# Patient Record
Sex: Female | Born: 1955 | Race: White | Hispanic: No | Smoking: Never smoker
Health system: Southern US, Community
[De-identification: ages and names within clinical notes are randomized; demographics above are authoritative.]

## PROBLEM LIST (undated history)

## (undated) DIAGNOSIS — I471 Supraventricular tachycardia, unspecified: Secondary | ICD-10-CM

## (undated) DIAGNOSIS — K219 Gastro-esophageal reflux disease without esophagitis: Secondary | ICD-10-CM

## (undated) DIAGNOSIS — R519 Headache, unspecified: Secondary | ICD-10-CM

## (undated) DIAGNOSIS — G8929 Other chronic pain: Secondary | ICD-10-CM

## (undated) DIAGNOSIS — M1990 Unspecified osteoarthritis, unspecified site: Secondary | ICD-10-CM

## (undated) DIAGNOSIS — T7840XA Allergy, unspecified, initial encounter: Secondary | ICD-10-CM

## (undated) DIAGNOSIS — J342 Deviated nasal septum: Secondary | ICD-10-CM

## (undated) DIAGNOSIS — Z8709 Personal history of other diseases of the respiratory system: Secondary | ICD-10-CM

## (undated) DIAGNOSIS — F419 Anxiety disorder, unspecified: Secondary | ICD-10-CM

## (undated) DIAGNOSIS — Z8739 Personal history of other diseases of the musculoskeletal system and connective tissue: Secondary | ICD-10-CM

## (undated) DIAGNOSIS — M419 Scoliosis, unspecified: Secondary | ICD-10-CM

## (undated) DIAGNOSIS — R51 Headache: Secondary | ICD-10-CM

## (undated) HISTORY — DX: Allergy, unspecified, initial encounter: T78.40XA

## (undated) HISTORY — DX: Personal history of other diseases of the musculoskeletal system and connective tissue: Z87.39

## (undated) HISTORY — DX: Deviated nasal septum: J34.2

## (undated) HISTORY — DX: Personal history of other diseases of the respiratory system: Z87.09

---

## 2005-02-02 ENCOUNTER — Ambulatory Visit: Payer: Self-pay | Admitting: Internal Medicine

## 2005-02-06 ENCOUNTER — Ambulatory Visit: Payer: Self-pay | Admitting: Internal Medicine

## 2006-02-22 ENCOUNTER — Ambulatory Visit: Payer: Self-pay | Admitting: Internal Medicine

## 2007-03-13 ENCOUNTER — Ambulatory Visit: Payer: Self-pay | Admitting: Internal Medicine

## 2007-05-20 ENCOUNTER — Ambulatory Visit: Payer: Self-pay | Admitting: Unknown Physician Specialty

## 2008-03-16 ENCOUNTER — Ambulatory Visit: Payer: Self-pay | Admitting: Internal Medicine

## 2009-03-24 ENCOUNTER — Ambulatory Visit: Payer: Self-pay | Admitting: Internal Medicine

## 2010-04-19 ENCOUNTER — Ambulatory Visit: Payer: Self-pay | Admitting: Internal Medicine

## 2010-07-25 ENCOUNTER — Emergency Department: Payer: Self-pay | Admitting: Internal Medicine

## 2011-04-06 LAB — HM PAP SMEAR

## 2011-06-06 ENCOUNTER — Ambulatory Visit: Payer: Self-pay | Admitting: Internal Medicine

## 2011-06-06 LAB — HM MAMMOGRAPHY

## 2012-03-01 ENCOUNTER — Telehealth: Payer: Self-pay | Admitting: Internal Medicine

## 2012-03-01 MED ORDER — HYDROCORTISONE ACETATE 25 MG RE SUPP: 25.0000 mg | Freq: Two times a day (BID) | RECTAL | Status: DC

## 2012-03-01 NOTE — Telephone Encounter (Signed)
Refill Hydrocortisone HC 25 mg suppository. [facility] on [address].

## 2012-03-01 NOTE — Telephone Encounter (Signed)
Per Dr. Lorin Picket Refill with quant of 12 and 1 refill. Called patient and she is aware.

## 2012-03-22 ENCOUNTER — Encounter: Payer: Self-pay | Admitting: *Deleted

## 2012-04-05 ENCOUNTER — Ambulatory Visit (INDEPENDENT_AMBULATORY_CARE_PROVIDER_SITE_OTHER): Payer: BC Managed Care – PPO | Admitting: Internal Medicine

## 2012-04-05 ENCOUNTER — Encounter: Payer: Self-pay | Admitting: Internal Medicine

## 2012-04-05 VITALS — BP 120/82 | HR 77 | Temp 98.0°F | Ht 66.0 in | Wt 148.2 lb

## 2012-04-05 DIAGNOSIS — Z139 Encounter for screening, unspecified: Secondary | ICD-10-CM

## 2012-04-05 DIAGNOSIS — R5383 Other fatigue: Secondary | ICD-10-CM

## 2012-04-05 DIAGNOSIS — Z9109 Other allergy status, other than to drugs and biological substances: Secondary | ICD-10-CM

## 2012-04-05 DIAGNOSIS — R5381 Other malaise: Secondary | ICD-10-CM

## 2012-04-05 DIAGNOSIS — K219 Gastro-esophageal reflux disease without esophagitis: Secondary | ICD-10-CM

## 2012-04-05 MED ORDER — AZELASTINE HCL 0.1 % NA SOLN: 1.0000 | Freq: Two times a day (BID) | NASAL | Status: DC

## 2012-04-07 ENCOUNTER — Encounter: Payer: Self-pay | Admitting: Internal Medicine

## 2012-04-07 DIAGNOSIS — K219 Gastro-esophageal reflux disease without esophagitis: Secondary | ICD-10-CM | POA: Insufficient documentation

## 2012-04-07 DIAGNOSIS — Z91048 Other nonmedicinal substance allergy status: Secondary | ICD-10-CM | POA: Insufficient documentation

## 2012-04-07 NOTE — Progress Notes (Addendum)
  Subjective:    Patient ID: Theresa Buck, female    DOB: Oct 17, 1955, 56 y.o.   MRN: [id number]  HPI 56 year old female with past history of recurrent allergy and sinus problems who comes in today for a scheduled follow up.  She states she has been under increased stress recently.  Her mother passed away recently.  Was diagnosed with a brain tumor and passed away quickly.  Her son (32) also recenlty had a myocardial infarction and had to have bypass surgery.  Feels she is handling things relatively well.  Does not feel she needs any further intervention at this time.  Eating and drinking well.  No nausea or vomiting.  No bowel change.  Hair is thinning.    Past Medical History  Diagnosis Date  . H/O sinusitis   . Deviated septum   . Hx of degenerative disc disease     Review of Systems Patient denies any headache, lightheadedness or dizziness.  No significant sinu or allergy symptoms.  No chest pain, tightness or palpitations.  No increased shortness of breath, cough or congestion.  No nausea or vomiting.  No abdominal pain or cramping.  No bowel change, such as diarrhea, constipation, BRBPR or melana.  No urine change.  Does report some fatigue.       Objective:   Physical Exam Filed Vitals:   04/05/12 1557  BP: 120/82  Pulse: 77  Temp: 98 F (70.78 C)   56 year old female in no acute distress.   HEENT:  Nares - clear.  OP- without lesions or erythema.  NECK:  Supple, nontender.  No audible bruit.   HEART:  Appears to be regular. LUNGS:  Without crackles or wheezing audible.  Respirations even and unlabored.   RADIAL PULSE:  Equal bilaterally.  ABDOMEN:  Soft, nontender.  No audible abdominal bruit.   EXTREMITIES:  No increased edema to be present.                  Assessment & Plan:  INCREASED PSYCHOSOCIAL STRESSORS.  Feels she is handling things relatively well.  Does not need any further intervention at this point.  Follow.    CARDIOVASCULAR.  Had stress test 10/29/09 that was  negative for ischemia.  Currently asymptomatic.   GI.  Colonoscopy 05/20/07 revealed a 5mm polyp in the transverse colon and internal hemorrhoids.  Recommended follow up colonoscopy in five years.  Hemoccult cards negative 06/05/11.    FATIGUE.  Probably related to above.  Follow.  Check cbc,metc and tsh.   HEALTH MAINTENANCE. Physical 05/03/11.  Pelvic and pap 06/06/11 negative with negative HPV.  Mammogram 06/06/11 - BiRADS II.

## 2012-04-07 NOTE — Assessment & Plan Note (Signed)
EGD 02/03/00 revealed reflux esophagitis.  On protonix and doing well.  Follow.  

## 2012-04-07 NOTE — Assessment & Plan Note (Signed)
Having increased drainage and "dripping".  Is on Flonase and an antihistamine.  Will try Astelin nasal spray.  Notify me if persistent problems.

## 2012-04-08 ENCOUNTER — Encounter: Payer: Self-pay | Admitting: *Deleted

## 2012-04-30 ENCOUNTER — Telehealth: Payer: Self-pay | Admitting: Internal Medicine

## 2012-04-30 NOTE — Telephone Encounter (Signed)
Patient wants to know when she should have her blood work done.

## 2012-04-30 NOTE — Telephone Encounter (Signed)
Labs scheduled for 05-20-12

## 2012-05-22 ENCOUNTER — Other Ambulatory Visit: Payer: BC Managed Care – PPO

## 2012-05-27 ENCOUNTER — Other Ambulatory Visit (INDEPENDENT_AMBULATORY_CARE_PROVIDER_SITE_OTHER): Payer: BC Managed Care – PPO

## 2012-05-27 DIAGNOSIS — R5381 Other malaise: Secondary | ICD-10-CM

## 2012-05-27 DIAGNOSIS — Z139 Encounter for screening, unspecified: Secondary | ICD-10-CM

## 2012-05-27 DIAGNOSIS — R5383 Other fatigue: Secondary | ICD-10-CM

## 2012-05-27 LAB — CBC WITH DIFFERENTIAL/PLATELET
Basophils Absolute: 0 10*3/uL (ref 0.0–0.1)
Basophils Relative: 0.4 % (ref 0.0–3.0)
Eosinophils Absolute: 0.3 10*3/uL (ref 0.0–0.7)
Eosinophils Relative: 4.4 % (ref 0.0–5.0)
HCT: 39.6 % (ref 36.0–46.0)
Hemoglobin: 13.4 g/dL (ref 12.0–15.0)
Lymphocytes Relative: 24.9 % (ref 12.0–46.0)
Lymphs Abs: 1.6 10*3/uL (ref 0.7–4.0)
MCHC: 33.8 g/dL (ref 30.0–36.0)
MCV: 93.4 fl (ref 78.0–100.0)
Monocytes Absolute: 0.5 10*3/uL (ref 0.1–1.0)
Monocytes Relative: 7.8 % (ref 3.0–12.0)
Neutro Abs: 4 10*3/uL (ref 1.4–7.7)
Neutrophils Relative %: 62.5 % (ref 43.0–77.0)
Platelets: 214 10*3/uL (ref 150.0–400.0)
RBC: 4.24 Mil/uL (ref 3.87–5.11)
RDW: 12.6 % (ref 11.5–14.6)
WBC: 6.4 10*3/uL (ref 4.5–10.5)

## 2012-05-27 LAB — COMPREHENSIVE METABOLIC PANEL
ALT: 13 U/L (ref 0–35)
AST: 17 U/L (ref 0–37)
Albumin: 3.8 g/dL (ref 3.5–5.2)
Alkaline Phosphatase: 80 U/L (ref 39–117)
BUN: 15 mg/dL (ref 6–23)
CO2: 27 mEq/L (ref 19–32)
Calcium: 9 mg/dL (ref 8.4–10.5)
Chloride: 108 mEq/L (ref 96–112)
Creatinine, Ser: 0.7 mg/dL (ref 0.4–1.2)
GFR: 89.28 mL/min (ref >60.00)
Glucose, Bld: 86 mg/dL (ref 70–99)
Potassium: 4.1 mEq/L (ref 3.5–5.1)
Sodium: 141 mEq/L (ref 135–145)
Total Bilirubin: 0.8 mg/dL (ref 0.3–1.2)
Total Protein: 6.9 g/dL (ref 6.0–8.3)

## 2012-05-27 LAB — TSH: TSH: 1.12 u[IU]/mL (ref 0.35–5.50)

## 2012-05-27 LAB — LIPID PANEL
Cholesterol: 184 mg/dL (ref 0–200)
HDL: 43.7 mg/dL (ref >39.00)
LDL Cholesterol: 119 mg/dL — ABNORMAL HIGH (ref 0–99)
Total CHOL/HDL Ratio: 4
Triglycerides: 106 mg/dL (ref 0.0–149.0)
VLDL: 21.2 mg/dL (ref 0.0–40.0)

## 2012-06-03 ENCOUNTER — Encounter: Payer: Self-pay | Admitting: Internal Medicine

## 2012-06-04 ENCOUNTER — Other Ambulatory Visit: Payer: Self-pay | Admitting: *Deleted

## 2012-06-04 ENCOUNTER — Telehealth: Payer: Self-pay | Admitting: Internal Medicine

## 2012-06-04 DIAGNOSIS — M25579 Pain in unspecified ankle and joints of unspecified foot: Secondary | ICD-10-CM

## 2012-06-04 NOTE — Telephone Encounter (Signed)
Please leave a message with her lab results. On her cell phone #

## 2012-06-04 NOTE — Telephone Encounter (Signed)
Order placed for podiatry referral.   

## 2012-06-05 NOTE — Telephone Encounter (Signed)
Patient notified. Patient would like an early am appointment or a late pm appointment. Thank you.

## 2012-06-06 MED ORDER — FLUTICASONE PROPIONATE 50 MCG/ACT NA SUSP: 2.0000 | Freq: Every day | NASAL | Status: DC

## 2012-06-08 ENCOUNTER — Other Ambulatory Visit: Payer: Self-pay

## 2012-07-01 ENCOUNTER — Other Ambulatory Visit: Payer: Self-pay | Admitting: Internal Medicine

## 2012-07-05 ENCOUNTER — Encounter: Payer: BC Managed Care – PPO | Admitting: Internal Medicine

## 2012-07-19 ENCOUNTER — Ambulatory Visit (INDEPENDENT_AMBULATORY_CARE_PROVIDER_SITE_OTHER): Payer: BC Managed Care – PPO | Admitting: Internal Medicine

## 2012-07-19 ENCOUNTER — Encounter: Payer: Self-pay | Admitting: Internal Medicine

## 2012-07-19 ENCOUNTER — Other Ambulatory Visit (HOSPITAL_COMMUNITY)
Admission: RE | Admit: 2012-07-19 | Discharge: 2012-07-19 | Disposition: A | Discharge status: Home / Self Care | Payer: BC Managed Care – PPO | Source: Ambulatory Visit | Attending: Internal Medicine | Admitting: Internal Medicine

## 2012-07-19 VITALS — BP 108/60 | HR 72 | Temp 97.5°F | Ht 66.0 in | Wt 151.5 lb

## 2012-07-19 DIAGNOSIS — K219 Gastro-esophageal reflux disease without esophagitis: Secondary | ICD-10-CM

## 2012-07-19 DIAGNOSIS — R000 Tachycardia, unspecified: Secondary | ICD-10-CM

## 2012-07-19 DIAGNOSIS — Z139 Encounter for screening, unspecified: Secondary | ICD-10-CM

## 2012-07-19 DIAGNOSIS — Z1239 Encounter for other screening for malignant neoplasm of breast: Secondary | ICD-10-CM

## 2012-07-19 DIAGNOSIS — Z01419 Encounter for gynecological examination (general) (routine) without abnormal findings: Secondary | ICD-10-CM | POA: Insufficient documentation

## 2012-07-19 DIAGNOSIS — Z124 Encounter for screening for malignant neoplasm of cervix: Secondary | ICD-10-CM

## 2012-07-19 DIAGNOSIS — Z1151 Encounter for screening for human papillomavirus (HPV): Secondary | ICD-10-CM | POA: Insufficient documentation

## 2012-07-19 DIAGNOSIS — Z9109 Other allergy status, other than to drugs and biological substances: Secondary | ICD-10-CM

## 2012-07-20 LAB — WET PREP BY MOLECULAR PROBE
Candida species: NEGATIVE
Gardnerella vaginalis: NEGATIVE
Trichomonas vaginosis: NEGATIVE

## 2012-07-21 ENCOUNTER — Encounter: Payer: Self-pay | Admitting: Internal Medicine

## 2012-07-21 NOTE — Progress Notes (Signed)
Subjective:    Patient ID: Theresa Buck, female    DOB: 1956-02-23, 57 y.o.   MRN: [id number]  HPI 57 year old female with past history of recurrent allergy and sinus problems who comes in today to follow up on these issues as well as for her complete physical exam.  Still dealing with increased stress.  Trying to cope with her mother's death.   Her son (2) also recenlty had a myocardial infarction and had to have bypass surgery.  Feels she is handling things relatively well.  Does not feel she needs any further intervention at this time. Some better.  Eating and drinking well.  No nausea or vomiting.  No bowel change.  She has had intermittent episodes of feeling her heart pounding.  The episodes will vary in length.  One recent episode lasted approximately 10 minutes.  Heart pounding.  Felt into her jaw.  Took aspirin.  Resolved.  Was not evaluated.  Tries to stay active.  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  Breathing stable.  No vomiting.  Bowels stable.    Past Medical History  Diagnosis Date  . H/O sinusitis   . Deviated septum   . Hx of degenerative disc disease   . Deviated septum   . Allergy     recurring sinus problems     Current Outpatient Prescriptions on File Prior to Visit  Medication Sig Dispense Refill  . cholecalciferol (VITAMIN D) 400 UNITS TABS Take 400 Units by mouth daily.      . fish oil-omega-3 fatty acids 1000 MG capsule Take 2 g by mouth daily.      . fluticasone (FLONASE) 50 MCG/ACT nasal spray Place 2 sprays into the nose daily.  16 g  3  . mometasone (NASONEX) 50 MCG/ACT nasal spray Place 2 sprays into the nose daily.      Marland Kitchen azelastine (ASTELIN) 137 MCG/SPRAY nasal spray Place 1 spray into the nose 2 (two) times daily. Use in each nostril as directed  30 mL  1  . hydrocortisone (ANUSOL-HC) 25 MG suppository Place 1 suppository (25 mg total) rectally 2 (two) times daily.  12 suppository  1  . pantoprazole (PROTONIX) 40 MG  tablet take 1 tablet by mouth once daily  30 tablet  2   No current facility-administered medications on file prior to visit.    Review of Systems Patient denies any headache, lightheadedness or dizziness.  No significant sinus or allergy symptoms.  She does report the intermittent fluttering, heart racing and heart pounding at times.  see above.  No increased shortness of breath, cough or congestion.  No nausea or vomiting.  Acid reflux controlled.  No abdominal pain or cramping.  No bowel change, such as diarrhea, constipation, BRBPR or melana.  No urine change.         Objective:   Physical Exam  Filed Vitals:   07/19/12 1433  BP: 108/60  Pulse: 72  Temp: 97.5 F (32.54 C)   57 year old female in no acute distress.   HEENT:  Nares- clear.  Oropharynx - without lesions. NECK:  Supple.  Nontender.  No audible bruit.  HEART:  Appears to be regular. LUNGS:  No crackles or wheezing audible.  Respirations even and unlabored.  RADIAL PULSE:  Equal bilaterally.    BREASTS:  No nipple discharge or nipple retraction present.  Could not appreciate any distinct nodules or axillary adenopathy.  ABDOMEN:  Soft, nontender.  Bowel sounds present and normal.  No audible abdominal bruit.  GU:  Normal external genitalia.  Vaginal vault without lesions.  Cervix identified.  Pap performed. Could not appreciate any adnexal masses or tenderness.   RECTAL:  Heme negative.   EXTREMITIES:  No increased edema present.  DP pulses palpable and equal bilaterally.          Assessment & Plan:  INCREASED PSYCHOSOCIAL STRESSORS.  Feels she is handling things relatively well.  Does not need any further intervention at this point.  Follow.  We discussed medication.  She wants to hold off at this time. Follow.   CARDIOVASCULAR.  Had stress test 10/29/09 that was negative for ischemia.  Symptoms as outlined. EKG today revealed SR with no acute ischemic changes.  See if can obtain old EKG for comparison.  Given  persistent intermittent episodes, I do feel she warrants further cardiac evaluation.  Will refer to cardiology for evaluation and further treatment.  Pt comfortable with this plan.    GI.  Colonoscopy 05/20/07 revealed a 5mm polyp in the transverse colon and internal hemorrhoids.  Recommended follow up colonoscopy in five years.  Due.  Will need to get the cardiac issue straightened out first.     HEALTH MAINTENANCE.  Physical today.   Mammogram 06/06/11 - BiRADS II.  Schedule a follow up mammogram.  GI as outlined.

## 2012-07-21 NOTE — Assessment & Plan Note (Signed)
Stable on current regimen.  Follow.   

## 2012-07-21 NOTE — Assessment & Plan Note (Signed)
EGD 02/03/00 revealed reflux esophagitis.  On protonix and doing well.  Follow.  

## 2012-07-22 ENCOUNTER — Encounter: Payer: Self-pay | Admitting: Emergency Medicine

## 2012-07-26 ENCOUNTER — Encounter: Payer: Self-pay | Admitting: Emergency Medicine

## 2012-07-26 ENCOUNTER — Telehealth: Payer: Self-pay | Admitting: Internal Medicine

## 2012-07-26 NOTE — Telephone Encounter (Signed)
Asking for referral to Dr. Mariah Milling - if so needs early a.m. Or late p.m. Appt.  Sched to be on vacation next week starting Wednesday so would have to be the week after at the earliest.   Just saw on mychart that you missed appt that was scheduled on 4/2 - not sure who that appt was with but wants to make sure she's not charged because she was unaware of the appt.

## 2012-07-26 NOTE — Telephone Encounter (Signed)
Noted  

## 2012-07-26 NOTE — Telephone Encounter (Signed)
Dr. Philemon Kingdom office called stating that pt missed an appointment with them on Wednesday. Their office will call later this afternoon to try to reschedule.

## 2012-07-26 NOTE — Telephone Encounter (Signed)
Patient has been rescheduled.

## 2012-07-26 NOTE — Telephone Encounter (Signed)
Spoke to patient about missing her appointment with Dr. Gwen Pounds. Patient stated that she was unaware of referral and that she does not want to be charged for the missed appointment. Patient also said that she does not want to see Dr. Gwen Pounds, she wants an appointment with Dr. Mariah Milling. See message below for patient appointment preferences. Only leave messages on cell or work numbers. Also gave patient her pap results again, patient stated that she had not received but message in epic states that patient was given results.

## 2012-07-30 ENCOUNTER — Encounter: Payer: Self-pay | Admitting: Internal Medicine

## 2012-08-15 ENCOUNTER — Telehealth: Payer: Self-pay | Admitting: Internal Medicine

## 2012-08-15 ENCOUNTER — Encounter: Payer: Self-pay | Admitting: Internal Medicine

## 2012-08-15 NOTE — Telephone Encounter (Signed)
LMTCB

## 2012-08-15 NOTE — Telephone Encounter (Signed)
If symptoms and feel need abx - needs eval.  I can work her in tomorrow pm (after 4:00) - she may have to wait - being worked in.  Or i can see her next week.

## 2012-08-16 NOTE — Telephone Encounter (Signed)
Offered pt 4/25 @ 3:15 or 4:15.  Pt stated she could not get off work to come

## 2012-08-20 ENCOUNTER — Ambulatory Visit: Payer: Self-pay | Admitting: Internal Medicine

## 2012-08-24 ENCOUNTER — Encounter: Payer: Self-pay | Admitting: Internal Medicine

## 2012-08-27 ENCOUNTER — Ambulatory Visit (INDEPENDENT_AMBULATORY_CARE_PROVIDER_SITE_OTHER): Payer: BC Managed Care – PPO | Admitting: Cardiovascular Disease

## 2012-08-27 ENCOUNTER — Encounter: Payer: Self-pay | Admitting: Cardiovascular Disease

## 2012-08-27 VITALS — BP 110/80 | HR 65 | Ht 66.0 in | Wt 157.0 lb

## 2012-08-27 DIAGNOSIS — R0602 Shortness of breath: Secondary | ICD-10-CM

## 2012-08-27 DIAGNOSIS — Z01810 Encounter for preprocedural cardiovascular examination: Secondary | ICD-10-CM

## 2012-08-27 DIAGNOSIS — I471 Supraventricular tachycardia, unspecified: Secondary | ICD-10-CM

## 2012-08-27 DIAGNOSIS — Z733 Stress, not elsewhere classified: Secondary | ICD-10-CM

## 2012-08-27 DIAGNOSIS — R42 Dizziness and giddiness: Secondary | ICD-10-CM

## 2012-08-27 DIAGNOSIS — R0789 Other chest pain: Secondary | ICD-10-CM

## 2012-08-27 DIAGNOSIS — F439 Reaction to severe stress, unspecified: Secondary | ICD-10-CM | POA: Insufficient documentation

## 2012-08-27 MED ORDER — PROPRANOLOL HCL 20 MG PO TABS: 20.0000 mg | ORAL_TABLET | Freq: Three times a day (TID) | ORAL | Status: DC | PRN

## 2012-08-27 MED ORDER — DILTIAZEM HCL 30 MG PO TABS: 30.0000 mg | ORAL_TABLET | Freq: Four times a day (QID) | ORAL | Status: DC | PRN

## 2012-08-27 NOTE — Patient Instructions (Addendum)
You are doing well.  For cholesterol, you could try RED YEAST RICE   You might be having runs of SVT (rate 120 to 200 ) Also could have atrial tachycardia (rate 100 to 120) Adenosine can break this rhythm  If you have a run of tachycardia, take a propranolol and diltiazem   Please call us if you have new issues that need to be addressed before your next appt.

## 2012-08-27 NOTE — Progress Notes (Signed)
Patient ID: Theresa Buck, female    DOB: 07/23/1955, 57 y.o.   MRN: [id number]  HPI Comments: Theresa Buck is a very pleasant 57 year old woman who presents by referral from Dr. Lorin Picket for periods of tachycardia, preop for bunion surgery.  She reports that for several years, she has had rare episodes of tachycardia. Recent severe episode in February 2014 she described her event has acute onset, very fast, pounding heartbeat for least 10 minutes. She had just eaten when the events happened..It resolved without intervention, though she had to sit down. She was very symptomatic. This was the longest episode. Prior episodes did not last as long. Total episodes probably 10 or less. No triggers such as caffeine or exercise.  She otherwise has significant stress in her life. 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  She's not exercising as she used to do, has been gaining weight. Sleep is poor. Sometimes takes a sleep pill but this wears off at 4 in the morning.  EKG shows normal sinus rhythm with rate 65 beats per minute with no significant ST or T wave changes     Outpatient Encounter Prescriptions as of 08/27/2012  Medication Sig Dispense Refill  . Ascorbic Acid (VITAMIN C) 100 MG tablet Take 100 mg by mouth daily.      Marland Kitchen azelastine (ASTELIN) 137 MCG/SPRAY nasal spray Place 1 spray into the nose 2 (two) times daily. Use in each nostril as directed  30 mL  1  . BIOTIN PO Take by mouth 2 (two) times daily.      . cholecalciferol (VITAMIN D) 400 UNITS TABS Take 400 Units by mouth daily.      Marland Kitchen estradiol (ESTRACE) 1 MG tablet Take 1 mg by mouth daily.      . fish oil-omega-3 fatty acids 1000 MG capsule Take 2 g by mouth daily.      . fluticasone (FLONASE) 50 MCG/ACT nasal spray Place 2 sprays into the nose daily.  16 g  3  . hydrocortisone (ANUSOL-HC) 25 MG suppository Place 1 suppository (25 mg total) rectally 2 (two) times daily.   12 suppository  1  . mometasone (NASONEX) 50 MCG/ACT nasal spray Place 2 sprays into the nose daily.      . Multiple Vitamin (MULTIVITAMIN) tablet Take 1 tablet by mouth daily.      . pantoprazole (PROTONIX) 40 MG tablet take 1 tablet by mouth once daily  30 tablet  2  . progesterone 200 MG SUPP Place 200 mg vaginally at bedtime.        Review of Systems  Constitutional: Negative.   HENT: Negative.   Eyes: Negative.   Respiratory: Negative.   Cardiovascular: Negative.   Gastrointestinal: Negative.   Musculoskeletal: Negative.   Skin: Negative.   Neurological: Negative.   Psychiatric/Behavioral: Negative.   All other systems reviewed and are negative.    BP 110/80  Pulse 65  Ht 5\' 6"  (1.676 m)  Wt 157 lb (71.215 kg)  BMI 25.35 kg/m2  Physical Exam  Nursing note and vitals reviewed. Constitutional: She is oriented to person, place, and time. She appears well-developed and well-nourished.  HENT:  Head: Normocephalic.  Nose: Nose normal.  Mouth/Throat: Oropharynx is clear and moist.  Eyes: Conjunctivae are normal. Pupils are equal, round, and reactive to light.  Neck: Normal range of motion. Neck supple. No JVD present.  Cardiovascular: Normal rate, regular rhythm, S1 normal, S2  normal, normal heart sounds and intact distal pulses.  Exam reveals no gallop and no friction rub.   No murmur heard. Pulmonary/Chest: Effort normal and breath sounds normal. No respiratory distress. She has no wheezes. She has no rales. She exhibits no tenderness.  Abdominal: Soft. Bowel sounds are normal. She exhibits no distension. There is no tenderness.  Musculoskeletal: Normal range of motion. She exhibits no edema and no tenderness.  Lymphadenopathy:    She has no cervical adenopathy.  Neurological: She is alert and oriented to person, place, and time. Coordination normal.  Skin: Skin is warm and dry. No rash noted. No erythema.  Psychiatric: She has a normal mood and affect. Her behavior is  normal. Judgment and thought content normal.    Assessment and Plan

## 2012-08-27 NOTE — Assessment & Plan Note (Signed)
No further workup needed prior to foot surgery. She reports symptoms are better, she is delaying surgery for the time being.

## 2012-08-27 NOTE — Assessment & Plan Note (Signed)
Rare episodes of tachycardia concerning for either SVT or atrial tachycardia. We have discussed the arrhythmias at length. We have given her diltiazem and propranolol to take for emergencies only if she has other episodes of tachycardia that do not resolve. If she has additional episodes, she could call EMTs, EKG would help identify the rhythm. For SVT, adenosine could be given.   Clinical exam is essentially benign. We have offered Holter monitor if symptoms get worse. She would prefer to wait at this time. Echocardiogram could be done to rule out structural disease. clinical exam is essentially benign with no murmurs. She will call us if she starts to have more episodes.Marland Kitchen

## 2012-08-27 NOTE — Assessment & Plan Note (Signed)
She has both stress at work and at home. I have encouraged her to try to find time for herself to exercise.

## 2012-09-27 ENCOUNTER — Encounter: Payer: Self-pay | Admitting: Internal Medicine

## 2012-10-22 ENCOUNTER — Encounter: Payer: Self-pay | Admitting: Internal Medicine

## 2012-10-22 ENCOUNTER — Ambulatory Visit (INDEPENDENT_AMBULATORY_CARE_PROVIDER_SITE_OTHER): Payer: BC Managed Care – PPO | Admitting: Internal Medicine

## 2012-10-22 VITALS — BP 120/80 | HR 66 | Temp 98.4°F | Ht 66.0 in | Wt 155.0 lb

## 2012-10-22 DIAGNOSIS — Z9109 Other allergy status, other than to drugs and biological substances: Secondary | ICD-10-CM

## 2012-10-22 DIAGNOSIS — F439 Reaction to severe stress, unspecified: Secondary | ICD-10-CM

## 2012-10-22 DIAGNOSIS — I471 Supraventricular tachycardia: Secondary | ICD-10-CM

## 2012-10-22 DIAGNOSIS — Z733 Stress, not elsewhere classified: Secondary | ICD-10-CM

## 2012-10-22 DIAGNOSIS — K219 Gastro-esophageal reflux disease without esophagitis: Secondary | ICD-10-CM

## 2012-10-23 ENCOUNTER — Other Ambulatory Visit: Payer: Self-pay | Admitting: *Deleted

## 2012-10-23 ENCOUNTER — Encounter: Payer: Self-pay | Admitting: Internal Medicine

## 2012-10-23 MED ORDER — PANTOPRAZOLE SODIUM 40 MG PO TBEC: DELAYED_RELEASE_TABLET | ORAL | Status: DC

## 2012-10-23 NOTE — Assessment & Plan Note (Signed)
Stable.  Has not had a "bad episode" recently.  See Dr Windell Hummingbird note for details.  Has propranolol and cardizem to take prn.  Follow.

## 2012-10-23 NOTE — Progress Notes (Signed)
Subjective:    Patient ID: Theresa Buck, female    DOB: 01-09-1956, 57 y.o.   MRN: [id number]  HPI 57 year old female with past history of recurrent allergy and sinus problems who comes in today for a scheduled follow up.  Still dealing with increased stress.  Increased stress at work.  Also some home stress.  Feels she is handling things relatively well.  Some hot flashes.  Not sleeping well.  Discussed at length with her today regarding further treatment for sleep, hot flashes and increased stress.  Eating and drinking well.  No nausea or vomiting.  No bowel change.  She has had intermittent episodes of feeling her heart pounding.  See previous note for details.  Saw Dr Mariah Milling.  Refer to his note for details.  Has propranolol and cardizem to take prn.  Has taken the cardizem on a couple of occasions.  Helped.  Has not had another bad episode.  Breathing stable.  No vomiting.  Bowels stable.    Past Medical History  Diagnosis Date  . H/O sinusitis   . Deviated septum   . Hx of degenerative disc disease   . Deviated septum   . Allergy     recurring sinus problems    Current Outpatient Prescriptions on File Prior to Visit  Medication Sig Dispense Refill  . Ascorbic Acid (VITAMIN C) 100 MG tablet Take 100 mg by mouth daily.      Marland Kitchen azelastine (ASTELIN) 137 MCG/SPRAY nasal spray Place 1 spray into the nose 2 (two) times daily. Use in each nostril as directed  30 mL  1  . BIOTIN PO Take by mouth 2 (two) times daily.      . cholecalciferol (VITAMIN D) 400 UNITS TABS Take 400 Units by mouth daily.      Marland Kitchen diltiazem (CARDIZEM) 30 MG tablet Take 1 tablet (30 mg total) by mouth 4 (four) times daily as needed.  90 tablet  6  . estradiol (ESTRACE) 1 MG tablet Take 1 mg by mouth daily.      . fish oil-omega-3 fatty acids 1000 MG capsule Take 2 g by mouth daily.      . fluticasone (FLONASE) 50 MCG/ACT nasal spray Place 2 sprays into the nose daily.  16 g  3  . hydrocortisone (ANUSOL-HC) 25 MG  suppository Place 1 suppository (25 mg total) rectally 2 (two) times daily.  12 suppository  1  . mometasone (NASONEX) 50 MCG/ACT nasal spray Place 2 sprays into the nose daily.      . Multiple Vitamin (MULTIVITAMIN) tablet Take 1 tablet by mouth daily.      . pantoprazole (PROTONIX) 40 MG tablet take 1 tablet by mouth once daily  30 tablet  2  . progesterone 200 MG SUPP Place 200 mg vaginally at bedtime.      . propranolol (INDERAL) 20 MG tablet Take 1 tablet (20 mg total) by mouth 3 (three) times daily as needed.  90 tablet  3   No current facility-administered medications on file prior to visit.    Review of Systems Patient denies any headache, lightheadedness or dizziness.  No significant sinus or allergy symptoms.  She does report the intermittent fluttering, heart racing as outlined.  Better.  See above.  No increased shortness of breath, cough or congestion.  No nausea or vomiting.  Acid reflux controlled.  No abdominal pain or cramping.  No bowel change, such as diarrhea, constipation, BRBPR or melana.  No urine change.  Taking  medication now for a vaginal infection.  Increased stress as outlined.  Not sleeping.  Having hot flashes.  On estrogen.         Objective:   Physical Exam  Filed Vitals:   10/22/12 1547  BP: 120/80  Pulse: 66  Temp: 98.4 F (2.7 C)   57 year old female in no acute distress.   HEENT:  Nares- clear.  Oropharynx - without lesions. NECK:  Supple.  Nontender.  No audible bruit.  HEART:  Appears to be regular. LUNGS:  No crackles or wheezing audible.  Respirations even and unlabored.  RADIAL PULSE:  Equal bilaterally.  ABDOMEN:  Soft, nontender.  Bowel sounds present and normal.  No audible abdominal bruit.  EXTREMITIES:  No increased edema present.  DP pulses palpable and equal bilaterally.          Assessment & Plan:  INCREASED PSYCHOSOCIAL STRESSORS.  Feels she is handling things relatively well.  Does not need any further intervention at this point.   Follow.  We discussed medication.  She wants to hold off at this time. Follow.  Discussed her sleeping issues.  She will hold on medication at this point.  May consider Effexor.    CARDIOVASCULAR.  Had stress test 10/29/09 that was negative for ischemia. Saw Dr Mariah Milling.  See his note for details.  Stable.  Has cardizem and propranolol as outlined.   GI.  Colonoscopy 05/20/07 revealed a 5mm polyp in the transverse colon and internal hemorrhoids.  Recommended follow up colonoscopy in five years.  Due.  Make sure stable from cardiac issues.  See previous note.      HEALTH MAINTENANCE.  Physical 07/19/12   Mammogram 08/20/12 - BiRADS II.  GI as outlined.

## 2012-10-23 NOTE — Assessment & Plan Note (Signed)
Stable on current regimen.  Follow.   

## 2012-10-23 NOTE — Assessment & Plan Note (Signed)
EGD 02/03/00 revealed reflux esophagitis.  On protonix and doing well.  Follow.  

## 2012-10-23 NOTE — Assessment & Plan Note (Signed)
Discussed at length with her today.  Discussed not sleeping and hot flashes.  Discussed various medications ant treatment regimens.  She desires to hold on any further treatment at this time.  Follow.  Will call or be reevaluated if symptoms change or worsen or if she changes her mind.

## 2012-11-08 ENCOUNTER — Other Ambulatory Visit: Payer: Self-pay | Admitting: *Deleted

## 2012-11-08 MED ORDER — HYDROCORTISONE ACETATE 25 MG RE SUPP: 25.0000 mg | Freq: Two times a day (BID) | RECTAL | Status: DC

## 2013-01-14 ENCOUNTER — Telehealth: Payer: Self-pay | Admitting: Internal Medicine

## 2013-01-14 NOTE — Telephone Encounter (Signed)
Patient Information:  [name] Name: Tyyonna  Phone: [phone]  Patient: Theresa Buck, Theresa Buck  Gender: Female  DOB: 04-29-1955  Age: 57 Years  PCP: [facility] [place]  Pregnant: No  Office Follow Up:  Does the office need to follow up with this patient?: Yes  Instructions For The Office: Please respond to patient's request for prescription.  RN Note:  Patient would like to know if Dr. Lorin Picket can prescribe anything for the sinus drainage she is having currently. Please contact patient regarding this request.  Symptoms  Reason For Call & Symptoms: Reports sinus drainage down throat, nasal congestion. Sinus pain and pressure off and on. Reports nausea from drainage.  Reviewed Health History In EMR: Yes  Reviewed Medications In EMR: Yes  Reviewed Allergies In EMR: Yes  Reviewed Surgeries / Procedures: Yes  Date of Onset of Symptoms: 01/12/2013  Treatments Tried: Prescribed nasal spray  Treatments Tried Worked: No OB / GYN:  LMP: Unknown  Guideline(s) Used:  Sinus Pain and Congestion  Disposition Per Guideline:   Home Care  Reason For Disposition Reached:   Sinus congestion as part of a cold, present < 10 days  Advice Given:  N/A  Patient Will Follow Care Advice:  YES

## 2013-01-15 NOTE — Telephone Encounter (Signed)
Please advise 

## 2013-01-15 NOTE — Telephone Encounter (Signed)
Need to know what she is taking now.  If just drainage - flonase nasal spray - 2 sprays each nostril q day (do in the evening).  Saline nasal spray - flush nose at least 2-3 x/day.  If can take mucinex - take mucinex 1 tablet in the am and robitussin in the evening.   If persistent sx, will need evaluation.  Unable to call in abx over the phone - per policy

## 2013-01-15 NOTE — Telephone Encounter (Signed)
Pt notified to try Mucinex-am & Robitussin-pm since she is already using the nasal sprays. Pt also aware to call back for an appt if sx's do not improve.

## 2013-01-31 ENCOUNTER — Ambulatory Visit (INDEPENDENT_AMBULATORY_CARE_PROVIDER_SITE_OTHER): Payer: BC Managed Care – PPO | Admitting: Internal Medicine

## 2013-01-31 ENCOUNTER — Encounter: Payer: Self-pay | Admitting: Internal Medicine

## 2013-01-31 VITALS — BP 120/80 | HR 76 | Temp 97.7°F | Ht 66.0 in | Wt 154.2 lb

## 2013-01-31 DIAGNOSIS — F439 Reaction to severe stress, unspecified: Secondary | ICD-10-CM

## 2013-01-31 DIAGNOSIS — K219 Gastro-esophageal reflux disease without esophagitis: Secondary | ICD-10-CM

## 2013-01-31 DIAGNOSIS — Z733 Stress, not elsewhere classified: Secondary | ICD-10-CM

## 2013-01-31 DIAGNOSIS — Z9109 Other allergy status, other than to drugs and biological substances: Secondary | ICD-10-CM

## 2013-01-31 DIAGNOSIS — I471 Supraventricular tachycardia: Secondary | ICD-10-CM

## 2013-01-31 DIAGNOSIS — N760 Acute vaginitis: Secondary | ICD-10-CM

## 2013-02-03 ENCOUNTER — Telehealth: Payer: Self-pay | Admitting: *Deleted

## 2013-02-03 NOTE — Telephone Encounter (Signed)
Has questions regarding lab order

## 2013-02-04 ENCOUNTER — Encounter: Payer: Self-pay | Admitting: Internal Medicine

## 2013-02-04 LAB — WET PREP BY MOLECULAR PROBE
Candida species: POSITIVE — AB
Gardnerella vaginalis: NEGATIVE
Trichomonas vaginosis: NEGATIVE

## 2013-02-06 MED ORDER — FLUCONAZOLE 150 MG PO TABS: 150.0000 mg | ORAL_TABLET | Freq: Once | ORAL | Status: DC

## 2013-02-06 NOTE — Telephone Encounter (Signed)
Prescription sent in for diflucan 150mg  x 1

## 2013-02-06 NOTE — Telephone Encounter (Signed)
Called and notified pt of results. Asking if she can have a Rx for Diflucan sent to [facility]. Would like call back if done.

## 2013-02-09 ENCOUNTER — Encounter: Payer: Self-pay | Admitting: Internal Medicine

## 2013-02-09 DIAGNOSIS — N760 Acute vaginitis: Secondary | ICD-10-CM | POA: Insufficient documentation

## 2013-02-09 NOTE — Assessment & Plan Note (Signed)
Stable on current regimen.   Follow.  Just completed levaquin.  Doing better.  Follow.

## 2013-02-09 NOTE — Assessment & Plan Note (Signed)
EGD 02/03/00 revealed reflux esophagitis.  On protonix and doing well.  Follow.  

## 2013-02-09 NOTE — Assessment & Plan Note (Signed)
Have discussed at length with her.   Discussed not sleeping and hot flashes.  Discussed various medications ant treatment regimens.  She desires to hold on any further treatment at this time.  Follow.  Will call or be reevaluated if symptoms change or worsen or if she changes her mind.

## 2013-02-09 NOTE — Assessment & Plan Note (Signed)
Stable.  Has not had a "bad episode" recently.  See Dr Windell Hummingbird note for details.  Has propranolol and cardizem to take prn.  Follow.

## 2013-02-09 NOTE — Assessment & Plan Note (Signed)
KOH/wet prep sent.  Await results.  

## 2013-02-09 NOTE — Progress Notes (Signed)
Subjective:    Patient ID: Theresa Buck, female    DOB: Sep 18, 1955, 57 y.o.   MRN: [id number]  HPI 57 year old female with past history of recurrent allergy and sinus problems who comes in today for a scheduled follow up.  Still dealing with increased stress.  Increased stress at work.  Also some home stress.  Feels she is handling things relatively well.   Eating and drinking well.  No nausea or vomiting.  No bowel change.  She has had intermittent episodes of feeling her heart pounding.  See previous notes for details.  Saw Dr Mariah Milling.  Refer to his note for details.  Has propranolol and cardizem to take prn.  Has taken the cardizem on a couple of occasions.  Helped.  Has not had another bad episode.  Breathing stable.  No vomiting.  Bowels stable.  She saw Dr Andee Poles for sinus issues.  Placed on Levaquin.  Has completed.  Better.  Some minimal residual symptoms.  She does report some vaginal irritation and would like to be checked.     Past Medical History  Diagnosis Date  . H/O sinusitis   . Deviated septum   . Hx of degenerative disc disease   . Deviated septum   . Allergy     recurring sinus problems    Current Outpatient Prescriptions on File Prior to Visit  Medication Sig Dispense Refill  . Ascorbic Acid (VITAMIN C) 100 MG tablet Take 100 mg by mouth daily.      Marland Kitchen azelastine (ASTELIN) 137 MCG/SPRAY nasal spray Place 1 spray into the nose 2 (two) times daily. Use in each nostril as directed  30 mL  1  . BIOTIN PO Take by mouth 2 (two) times daily.      . cholecalciferol (VITAMIN D) 400 UNITS TABS Take 400 Units by mouth daily.      Marland Kitchen diltiazem (CARDIZEM) 30 MG tablet Take 1 tablet (30 mg total) by mouth 4 (four) times daily as needed.  90 tablet  6  . estradiol (ESTRACE) 1 MG tablet Take 1 mg by mouth daily.      . fish oil-omega-3 fatty acids 1000 MG capsule Take 2 g by mouth daily.      . fluticasone (FLONASE) 50 MCG/ACT nasal spray Place 2 sprays into the nose daily.  16 g  3  .  hydrocortisone (ANUSOL-HC) 25 MG suppository Place 1 suppository (25 mg total) rectally 2 (two) times daily.  12 suppository  1  . mometasone (NASONEX) 50 MCG/ACT nasal spray Place 2 sprays into the nose daily.      . Multiple Vitamin (MULTIVITAMIN) tablet Take 1 tablet by mouth daily.      . pantoprazole (PROTONIX) 40 MG tablet take 1 tablet by mouth once daily  30 tablet  5  . progesterone 200 MG SUPP Place 200 mg vaginally at bedtime.      . propranolol (INDERAL) 20 MG tablet Take 1 tablet (20 mg total) by mouth 3 (three) times daily as needed.  90 tablet  3   No current facility-administered medications on file prior to visit.    Review of Systems Patient denies any headache, lightheadedness or dizziness.  No significant sinus or allergy symptoms.  Recent congestion.  Improved after Levaquin.  She does report the intermittent fluttering, heart racing as outlined.  Better.  See above.  No increased shortness of breath, cough or congestion.  No nausea or vomiting.  Acid reflux controlled.  No abdominal pain  or cramping.  No bowel change, such as diarrhea, constipation, BRBPR or melana.  No urine change.  Some vaginal irritation as outlined.      Objective:   Physical Exam  Filed Vitals:   01/31/13 1507  BP: 120/80  Pulse: 76  Temp: 97.7 F (79.47 C)   57 year old female in no acute distress.   HEENT:  Nares- clear.  Oropharynx - without lesions. NECK:  Supple.  Nontender.  No audible bruit.  HEART:  Appears to be regular. LUNGS:  No crackles or wheezing audible.  Respirations even and unlabored.  RADIAL PULSE:  Equal bilaterally.    ABDOMEN:  Soft, nontender.  Bowel sounds present and normal.  No audible abdominal bruit.  GU:  Normal external genitalia.  Vaginal vault without lesions.  Discharge present.  KOH/wet prep sent.   Could not appreciate any adnexal masses or tenderness.     EXTREMITIES:  No increased edema present.  DP pulses palpable and equal bilaterally.           Assessment & Plan:  INCREASED PSYCHOSOCIAL STRESSORS.  Feels she is handling things relatively well.  Does not need any further intervention at this point.  Follow.  We discussed medication.  She wants to hold off at this time. Follow.   CARDIOVASCULAR.  Had stress test 10/29/09 that was negative for ischemia. Saw Dr Mariah Milling.  See his note for details.  Stable.  Has cardizem and propranolol as outlined.   GI.  Colonoscopy 05/20/07 revealed a 5mm polyp in the transverse colon and internal hemorrhoids.  Recommended follow up colonoscopy in five years.  Due.        HEALTH MAINTENANCE.  Physical 07/19/12   Mammogram 08/20/12 - BiRADS II.  GI as outlined.

## 2013-02-27 ENCOUNTER — Other Ambulatory Visit: Payer: Self-pay

## 2013-03-10 ENCOUNTER — Other Ambulatory Visit: Payer: Self-pay | Admitting: *Deleted

## 2013-03-10 MED ORDER — FLUTICASONE PROPIONATE 50 MCG/ACT NA SUSP: 2.0000 | Freq: Every day | NASAL | Status: DC

## 2013-03-10 NOTE — Telephone Encounter (Signed)
The patient wants to know when this prescription for Flonase has been called to the pharmacy.

## 2013-03-11 ENCOUNTER — Encounter: Payer: Self-pay | Admitting: *Deleted

## 2013-03-11 NOTE — Telephone Encounter (Signed)
Sent mychart message

## 2013-03-13 NOTE — Telephone Encounter (Signed)
Mailed unread message to pt  

## 2013-03-21 ENCOUNTER — Telehealth: Payer: Self-pay | Admitting: Internal Medicine

## 2013-03-21 NOTE — Telephone Encounter (Signed)
Can not do it on Monday or Friday & either need a early morning or late afternoon appt since she works in [employer]

## 2013-03-21 NOTE — Telephone Encounter (Signed)
She is going to need eval if can feel a lump.  Can see about working her in next week.

## 2013-03-21 NOTE — Telephone Encounter (Signed)
Pt is having pain in breast and can feel a small lump. She is wanting to know if she should come in to be seen or if a referral could be placed ?? She wasn't sure what to do and wanted to know what Dr. Lorin Picket thought ??

## 2013-03-21 NOTE — Telephone Encounter (Signed)
Please advise 

## 2013-03-21 NOTE — Telephone Encounter (Signed)
See if she can come in at 4:30 on 03/27/13.  Thanks.  Work in for this.

## 2013-03-24 NOTE — Telephone Encounter (Signed)
Please call patient

## 2013-03-28 NOTE — Telephone Encounter (Signed)
See if she can come in at 4:30 on 04/03/13.

## 2013-03-28 NOTE — Telephone Encounter (Signed)
Late entry-pt was not able to make it yesterday (short notice)-please advise of another date/time

## 2013-03-31 NOTE — Telephone Encounter (Signed)
Left message, notifying of appointment date and time and requested call back to confirm she could make this.

## 2013-04-03 ENCOUNTER — Encounter: Payer: Self-pay | Admitting: Internal Medicine

## 2013-04-03 ENCOUNTER — Ambulatory Visit (INDEPENDENT_AMBULATORY_CARE_PROVIDER_SITE_OTHER): Payer: BC Managed Care – PPO | Admitting: Internal Medicine

## 2013-04-03 VITALS — BP 122/70 | HR 70 | Temp 98.0°F | Resp 12 | Ht 66.0 in | Wt 155.5 lb

## 2013-04-03 DIAGNOSIS — N644 Mastodynia: Secondary | ICD-10-CM

## 2013-04-03 NOTE — Progress Notes (Signed)
Pre visit review using our clinic review tool, if applicable. No additional management support is needed unless otherwise documented below in the visit note. 

## 2013-04-03 NOTE — Telephone Encounter (Signed)
LMTCB to see if she is coming since she never confirmed appt

## 2013-04-04 ENCOUNTER — Telehealth: Payer: Self-pay | Admitting: *Deleted

## 2013-04-04 DIAGNOSIS — N644 Mastodynia: Secondary | ICD-10-CM

## 2013-04-04 NOTE — Telephone Encounter (Signed)
Order placed for left breast mammo and ultrasound.

## 2013-04-04 NOTE — Telephone Encounter (Signed)
Pt called to inform you that she checked her insurance & the diagnostic test is covered 100% (pt could not go in depth because she was at work)- She was seen late yesterday afternoon

## 2013-04-06 ENCOUNTER — Encounter: Payer: Self-pay | Admitting: Internal Medicine

## 2013-04-06 DIAGNOSIS — N644 Mastodynia: Secondary | ICD-10-CM | POA: Insufficient documentation

## 2013-04-06 NOTE — Progress Notes (Signed)
Subjective:    Patient ID: Theresa Buck, female    DOB: May 12, 1955, 57 y.o.   MRN: [id number]  HPI 57 year old female with past history of recurrent allergy and sinus problems who comes in today as a work in with concerns regarding breast pain.  States symptoms started over one month ago.  She spotted two weeks ago for two days.  (prior to this spotting in had been approximately 8 months since she had spotted).  Describes pain in the left breast - outer breast.  No nipple discharge or nipple retraction.  No injury or trauma.     Past Medical History  Diagnosis Date  . H/O sinusitis   . Deviated septum   . Hx of degenerative disc disease   . Deviated septum   . Allergy     recurring sinus problems    Current Outpatient Prescriptions on File Prior to Visit  Medication Sig Dispense Refill  . Ascorbic Acid (VITAMIN C) 100 MG tablet Take 100 mg by mouth daily.      Marland Kitchen azelastine (ASTELIN) 137 MCG/SPRAY nasal spray Place 1 spray into the nose 2 (two) times daily. Use in each nostril as directed  30 mL  1  . BIOTIN PO Take by mouth 2 (two) times daily.      Marland Kitchen estradiol (ESTRACE) 1 MG tablet Take 1 mg by mouth daily.      . fluticasone (FLONASE) 50 MCG/ACT nasal spray Place 2 sprays into both nostrils daily.  16 g  3  . mometasone (NASONEX) 50 MCG/ACT nasal spray Place 2 sprays into the nose daily.      . Multiple Vitamin (MULTIVITAMIN) tablet Take 1 tablet by mouth daily.      . pantoprazole (PROTONIX) 40 MG tablet take 1 tablet by mouth once daily  30 tablet  5  . progesterone 200 MG SUPP Place 200 mg vaginally at bedtime.      . propranolol (INDERAL) 20 MG tablet Take 1 tablet (20 mg total) by mouth 3 (three) times daily as needed.  90 tablet  3  . diltiazem (CARDIZEM) 30 MG tablet Take 1 tablet (30 mg total) by mouth 4 (four) times daily as needed.  90 tablet  6  . hydrocortisone (ANUSOL-HC) 25 MG suppository Place 1 suppository (25 mg total) rectally 2 (two) times daily.  12 suppository   1   No current facility-administered medications on file prior to visit.    Review of Systems Patient denies any headache, lightheadedness or dizziness.  No significant sinus or allergy symptoms.  Recent congestion.  Improved after Levaquin.  She does report the intermittent fluttering, heart racing as outlined.  Better.  See above.  No increased shortness of breath, cough or congestion.  No nausea or vomiting.  Acid reflux controlled.  No abdominal pain or cramping.  No bowel change, such as diarrhea, constipation, BRBPR or melana.  No urine change.  Some vaginal irritation as outlined.      Objective:   Physical Exam  Filed Vitals:   04/03/13 1639  BP: 122/70  Pulse: 70  Temp: 98 F (36.7 C)  Resp: 79   57 year old female in no acute distress.   HEENT:  Nares- clear.  Oropharynx - without lesions. NECK:  Supple.  Nontender.  No audible bruit.  HEART:  Appears to be regular. LUNGS:  No crackles or wheezing audible.  Respirations even and unlabored.  RADIAL PULSE:  Equal bilaterally.    BREASTS:  No nipple  discharge or nipple retraction present.  Could not appreciate any distinct nodules or axillary adenopathy.  She did have some tenderness and fullness in the left breast - 4:00-5:00 region.  Inferior/outer breast.          Assessment & Plan:  HEALTH MAINTENANCE.  Physical 07/19/12   Mammogram 08/20/12 - BiRADS II.  GI as outlined.

## 2013-04-06 NOTE — Assessment & Plan Note (Signed)
Breast tenderness and fullness as outlined.  Will schedule diagnostic mammogram with ultrasound of left breast.  Further w/up pending results.

## 2013-04-07 ENCOUNTER — Telehealth: Payer: Self-pay | Admitting: *Deleted

## 2013-04-07 NOTE — Telephone Encounter (Signed)
Pt aware of apt.  

## 2013-04-07 NOTE — Telephone Encounter (Signed)
Pt called wanting to know the status of her Mammogram & Ultrasound appointment-please call patient

## 2013-04-28 ENCOUNTER — Ambulatory Visit: Payer: Self-pay | Admitting: Internal Medicine

## 2013-04-28 LAB — HM MAMMOGRAPHY: HM Mammogram: NEGATIVE

## 2013-04-29 ENCOUNTER — Encounter: Payer: Self-pay | Admitting: Internal Medicine

## 2013-06-06 ENCOUNTER — Other Ambulatory Visit: Payer: Self-pay | Admitting: *Deleted

## 2013-06-06 MED ORDER — PANTOPRAZOLE SODIUM 40 MG PO TBEC: DELAYED_RELEASE_TABLET | ORAL | Status: DC

## 2013-07-01 ENCOUNTER — Encounter: Payer: Self-pay | Admitting: Internal Medicine

## 2013-07-03 ENCOUNTER — Other Ambulatory Visit: Payer: Self-pay | Admitting: *Deleted

## 2013-07-03 NOTE — Telephone Encounter (Signed)
Refill

## 2013-07-04 MED ORDER — HYDROCORTISONE ACETATE 25 MG RE SUPP: 25.0000 mg | Freq: Two times a day (BID) | RECTAL | Status: DC

## 2013-07-04 NOTE — Telephone Encounter (Signed)
Prescription sent if for annusol [facility] suppositories.

## 2013-07-18 ENCOUNTER — Encounter: Payer: BC Managed Care – PPO | Admitting: Internal Medicine

## 2013-07-24 ENCOUNTER — Ambulatory Visit (INDEPENDENT_AMBULATORY_CARE_PROVIDER_SITE_OTHER): Payer: BC Managed Care – PPO | Admitting: Internal Medicine

## 2013-07-24 ENCOUNTER — Encounter: Payer: Self-pay | Admitting: Internal Medicine

## 2013-07-24 VITALS — BP 120/80 | HR 75 | Temp 98.1°F | Ht 64.7 in | Wt 154.2 lb

## 2013-07-24 DIAGNOSIS — K219 Gastro-esophageal reflux disease without esophagitis: Secondary | ICD-10-CM

## 2013-07-24 DIAGNOSIS — Z9109 Other allergy status, other than to drugs and biological substances: Secondary | ICD-10-CM

## 2013-07-24 DIAGNOSIS — F439 Reaction to severe stress, unspecified: Secondary | ICD-10-CM

## 2013-07-24 DIAGNOSIS — Z13220 Encounter for screening for lipoid disorders: Secondary | ICD-10-CM

## 2013-07-24 DIAGNOSIS — I471 Supraventricular tachycardia: Secondary | ICD-10-CM

## 2013-07-24 DIAGNOSIS — Z733 Stress, not elsewhere classified: Secondary | ICD-10-CM

## 2013-07-24 NOTE — Progress Notes (Signed)
Pre-visit discussion using our clinic review tool. No additional management support is needed unless otherwise documented below in the visit note.  

## 2013-07-27 ENCOUNTER — Encounter: Payer: Self-pay | Admitting: Internal Medicine

## 2013-07-27 ENCOUNTER — Other Ambulatory Visit: Payer: Self-pay | Admitting: Internal Medicine

## 2013-07-27 DIAGNOSIS — Z1239 Encounter for other screening for malignant neoplasm of breast: Secondary | ICD-10-CM

## 2013-07-27 NOTE — Assessment & Plan Note (Signed)
EGD 02/03/00 revealed reflux esophagitis.  On protonix and doing well.  Follow.

## 2013-07-27 NOTE — Progress Notes (Signed)
Subjective:    Patient ID: Theresa Buck, female    DOB: [birth year], 58 y.o.   MRN: [id number]  HPI 58 year old female with past history of recurrent allergy and sinus problems who comes in today for her complete physical exam.   Still dealing with increased stress.  Increased stress at work.  Also some home stress.  Feels she is handling things relatively well.   Does not feel she needs anything more at this time.  Eating and drinking well.  No nausea or vomiting.  No bowel change.  Breathing stable.  No vomiting.  Bowels stable.  No problems with increased heart rate or palpitations.     Past Medical History  Diagnosis Date  . H/O sinusitis   . Deviated septum   . Hx of degenerative disc disease   . Deviated septum   . Allergy     recurring sinus problems    Current Outpatient Prescriptions on File Prior to Visit  Medication Sig Dispense Refill  . Ascorbic Acid (VITAMIN C) 100 MG tablet Take 100 mg by mouth daily.      Marland Kitchen azelastine (ASTELIN) 137 MCG/SPRAY nasal spray Place 1 spray into the nose 2 (two) times daily. Use in each nostril as directed  30 mL  1  . BIOTIN PO Take by mouth 2 (two) times daily.      Marland Kitchen diltiazem (CARDIZEM) 30 MG tablet Take 1 tablet (30 mg total) by mouth 4 (four) times daily as needed.  90 tablet  6  . estradiol (ESTRACE) 1 MG tablet Take 1 mg by mouth daily.      . fluticasone (FLONASE) 50 MCG/ACT nasal spray Place 2 sprays into both nostrils daily.  16 g  3  . hydrocortisone (ANUSOL-HC) 25 MG suppository Place 1 suppository (25 mg total) rectally 2 (two) times daily.  14 suppository  0  . mometasone (NASONEX) 50 MCG/ACT nasal spray Place 2 sprays into the nose daily.      . Multiple Vitamin (MULTIVITAMIN) tablet Take 1 tablet by mouth daily.      . pantoprazole (PROTONIX) 40 MG tablet take 1 tablet by mouth once daily  30 tablet  5  . progesterone 200 MG SUPP Place 200 mg vaginally at bedtime.      . propranolol (INDERAL) 20 MG tablet Take 1 tablet (20 mg  total) by mouth 3 (three) times daily as needed.  90 tablet  3   No current facility-administered medications on file prior to visit.    Review of Systems Patient denies any headache, lightheadedness or dizziness.  No significant sinus or allergy symptoms.  Previously had intermittent fluttering, heart racing as outlined.  Better.  No problems reported today.   No increased shortness of breath, cough or congestion.  No nausea or vomiting.  Acid reflux controlled.  No abdominal pain or cramping.  No bowel change, such as diarrhea, constipation, BRBPR or melana.  No urine change.       Objective:   Physical Exam  Filed Vitals:   07/24/13 1558  BP: 120/80  Pulse: 75  Temp: 98.1 F (80.76 C)   57 year old female in no acute distress.   HEENT:  Nares- clear.  Oropharynx - without lesions. NECK:  Supple.  Nontender.  No audible bruit.  HEART:  Appears to be regular. LUNGS:  No crackles or wheezing audible.  Respirations even and unlabored.  RADIAL PULSE:  Equal bilaterally.    BREASTS:  No nipple discharge or nipple  retraction present.  Could not appreciate any distinct nodules or axillary adenopathy.  ABDOMEN:  Soft, nontender.  Bowel sounds present and normal.  No audible abdominal bruit.  GU:  Not performed.    EXTREMITIES:  No increased edema present.  DP pulses palpable and equal bilaterally.          Assessment & Plan:  INCREASED PSYCHOSOCIAL STRESSORS.  Feels she is handling things relatively well.  Does not need any further intervention at this point.  Follow.  We discussed medication.  She wants to hold off at this time. Follow.   CARDIOVASCULAR.  Had stress test 10/29/09 that was negative for ischemia. Saw Dr Rockey Situ.  See his note for details.  Stable.  Has cardizem and propranolol as outlined.   GI.  Colonoscopy 05/20/07 revealed a 34mm polyp in the transverse colon and internal hemorrhoids.  Recommended follow up colonoscopy in five years.  Due.        HEALTH MAINTENANCE.   Physical today.   Mammogram 08/20/12 - BiRADS II.  Schedule a f/u mammogram.  GI as outlined.    I spent 25 minutes with the patient and more than 50% of the time was spent in consultation regarding the above.

## 2013-07-27 NOTE — Assessment & Plan Note (Signed)
Have discussed at length with her.   She desires to hold on any further treatment at this time.  Follow.  Will call or be reevaluated if symptoms change or worsen or if she changes her mind.

## 2013-07-27 NOTE — Assessment & Plan Note (Signed)
Stable on current regimen.  Follow.   

## 2013-07-27 NOTE — Progress Notes (Signed)
Order placed for mammogram.

## 2013-07-27 NOTE — Assessment & Plan Note (Signed)
Stable.  See Dr Donivan Scull note for details.  Has propranolol and cardizem to take prn.  Follow.  No problems reported today.

## 2013-08-07 ENCOUNTER — Other Ambulatory Visit (INDEPENDENT_AMBULATORY_CARE_PROVIDER_SITE_OTHER): Payer: BC Managed Care – PPO

## 2013-08-07 DIAGNOSIS — I471 Supraventricular tachycardia: Secondary | ICD-10-CM

## 2013-08-07 DIAGNOSIS — Z13220 Encounter for screening for lipoid disorders: Secondary | ICD-10-CM

## 2013-08-07 LAB — LIPID PANEL
Cholesterol: 199 mg/dL (ref 0–200)
HDL: 45.6 mg/dL (ref >39.00)
LDL Cholesterol: 136 mg/dL — ABNORMAL HIGH (ref 0–99)
Total CHOL/HDL Ratio: 4
Triglycerides: 85 mg/dL (ref 0.0–149.0)
VLDL: 17 mg/dL (ref 0.0–40.0)

## 2013-08-07 LAB — COMPREHENSIVE METABOLIC PANEL
ALT: 17 U/L (ref 0–35)
AST: 17 U/L (ref 0–37)
Albumin: 4 g/dL (ref 3.5–5.2)
Alkaline Phosphatase: 75 U/L (ref 39–117)
BUN: 18 mg/dL (ref 6–23)
CO2: 28 mEq/L (ref 19–32)
Calcium: 9.3 mg/dL (ref 8.4–10.5)
Chloride: 104 mEq/L (ref 96–112)
Creatinine, Ser: 0.9 mg/dL (ref 0.4–1.2)
GFR: 69.6 mL/min (ref >60.00)
Glucose, Bld: 93 mg/dL (ref 70–99)
Potassium: 4.1 mEq/L (ref 3.5–5.1)
Sodium: 139 mEq/L (ref 135–145)
Total Bilirubin: 0.9 mg/dL (ref 0.3–1.2)
Total Protein: 7.3 g/dL (ref 6.0–8.3)

## 2013-08-07 LAB — CBC WITH DIFFERENTIAL/PLATELET
Basophils Absolute: 0 10*3/uL (ref 0.0–0.1)
Basophils Relative: 0.5 % (ref 0.0–3.0)
Eosinophils Absolute: 0.3 10*3/uL (ref 0.0–0.7)
Eosinophils Relative: 3.5 % (ref 0.0–5.0)
HCT: 43 % (ref 36.0–46.0)
Hemoglobin: 14.6 g/dL (ref 12.0–15.0)
Lymphocytes Relative: 25.6 % (ref 12.0–46.0)
Lymphs Abs: 2 10*3/uL (ref 0.7–4.0)
MCHC: 34 g/dL (ref 30.0–36.0)
MCV: 93.9 fl (ref 78.0–100.0)
Monocytes Absolute: 0.6 10*3/uL (ref 0.1–1.0)
Monocytes Relative: 7.2 % (ref 3.0–12.0)
Neutro Abs: 5 10*3/uL (ref 1.4–7.7)
Neutrophils Relative %: 63.2 % (ref 43.0–77.0)
Platelets: 242 10*3/uL (ref 150.0–400.0)
RBC: 4.58 Mil/uL (ref 3.87–5.11)
RDW: 12.4 % (ref 11.5–14.6)
WBC: 7.9 10*3/uL (ref 4.5–10.5)

## 2013-08-07 LAB — TSH: TSH: 1.35 u[IU]/mL (ref 0.35–5.50)

## 2013-08-08 ENCOUNTER — Encounter: Payer: Self-pay | Admitting: Internal Medicine

## 2013-09-22 ENCOUNTER — Telehealth: Payer: Self-pay | Admitting: *Deleted

## 2013-09-22 ENCOUNTER — Emergency Department: Payer: Self-pay | Admitting: Emergency Medicine

## 2013-09-22 LAB — URINALYSIS, COMPLETE
Bacteria: NONE SEEN
Bilirubin,UR: NEGATIVE
Blood: NEGATIVE
Glucose,UR: NEGATIVE mg/dL (ref 0–75)
Ketone: NEGATIVE
Leukocyte Esterase: NEGATIVE
Nitrite: NEGATIVE
Ph: 6 (ref 4.5–8.0)
Protein: NEGATIVE
RBC,UR: 1 /HPF (ref 0–5)
Specific Gravity: 1.014 (ref 1.003–1.030)
Squamous Epithelial: 1
WBC UR: <1 /HPF (ref 0–5)

## 2013-09-22 NOTE — Telephone Encounter (Signed)
Pt called stating that she just left the Chiropractor & she is in a lot of pain. He suggested that she contacts her PCP for medication. She states that Tramadol seems to help. She states that she has been taking Ibuprofen prior to today & it doesn't help much. She reports pain in the left hip/back area that radiates down to leg/knee (Sciatic pain). I informed patient that she would typically need to be seen here before we could send anything in. Pt insisted that I ask anyway.

## 2013-09-22 NOTE — Telephone Encounter (Signed)
Left detailed message on cell phone

## 2013-09-22 NOTE — Telephone Encounter (Signed)
Agree will need to be seen before pain medication can be called in.  If in acute pain now, then would recommend acute care evaluation today and then can f/u with her after.

## 2013-09-23 ENCOUNTER — Telehealth: Payer: Self-pay | Admitting: Internal Medicine

## 2013-09-23 NOTE — Telephone Encounter (Signed)
The patient is needing a referral for severe back and leg pain . Possible herniated disc.

## 2013-09-23 NOTE — Telephone Encounter (Signed)
LMTCB

## 2013-09-23 NOTE — Telephone Encounter (Signed)
Please advise (see phone note from 09/22/13)

## 2013-09-23 NOTE — Telephone Encounter (Signed)
Was she evaluated last evening?  Did she have xray?  Where is pain mostly?  Just need more information to make referral.

## 2013-09-23 NOTE — Telephone Encounter (Signed)
ER records received & placed in Dr. Nicki Reaper folder

## 2013-09-23 NOTE — Telephone Encounter (Signed)
Pt call back requesting a MRI referral & states that she has called 2-3 times today & nobody has returned her call. I informed her that I called around 2:00 & her husband told me she was sleep. I told him I was returning her call & he said he would have her call me back. PT went to ER last night due to pain. She was given Oxycodone, steroids, & a muscle relaxer. She said the no imaging or x-rays were done. She also mentioned that she needs a MRI or CT soon so that she can get back to work & that she was told that Dr. Nicki Reaper couldn't see her until July. Pt states that she is not happy with all the problems she is having when trying to get help or an appointment. I informed patient that she could ask for me directly anytime & that I relay everything to Dr. Nicki Reaper & will always return her call. Pt would like to have this addressed ASAP. I informed patient that I will request for her ER records right away & we would go from there.

## 2013-09-24 ENCOUNTER — Other Ambulatory Visit: Payer: Self-pay | Admitting: Internal Medicine

## 2013-09-24 DIAGNOSIS — R7989 Other specified abnormal findings of blood chemistry: Secondary | ICD-10-CM

## 2013-09-24 NOTE — Telephone Encounter (Signed)
See other note.  Pt has appt with ortho.

## 2013-09-24 NOTE — Telephone Encounter (Signed)
Wants referral ASAP

## 2013-09-24 NOTE — Progress Notes (Signed)
Opened in error

## 2013-09-24 NOTE — Telephone Encounter (Signed)
I have scheduled pt an appt with [facility] in [place] for tomorrow (09/25/13) @ 9am with Dr. Alfonso Ramus. Pt notified.

## 2013-09-24 NOTE — Telephone Encounter (Signed)
Wow.  That was fast.  Thanks.  I have her ER records and I will bring them in with me in the am to fax.  Thanks.

## 2013-09-24 NOTE — Telephone Encounter (Signed)
If having increased pain despite percocet, she does need further evaluation and w/up.  I would recommend if that much pain, referring her to ortho for evaluation and question of need for injection.  I do not mind seeing her, but I am probably going to end up referring for further help with pain.  Let me know.

## 2013-09-24 NOTE — Progress Notes (Signed)
Order placed for thyroid function tests.

## 2013-09-25 ENCOUNTER — Other Ambulatory Visit: Payer: Self-pay | Admitting: Sports Medicine

## 2013-09-25 DIAGNOSIS — M545 Low back pain, unspecified: Secondary | ICD-10-CM

## 2013-09-25 DIAGNOSIS — M5430 Sciatica, unspecified side: Secondary | ICD-10-CM

## 2013-09-26 ENCOUNTER — Ambulatory Visit
Admission: RE | Admit: 2013-09-26 | Discharge: 2013-09-26 | Disposition: A | Discharge status: Home / Self Care | Payer: BC Managed Care – PPO | Source: Ambulatory Visit | Attending: Sports Medicine | Admitting: Sports Medicine

## 2013-09-26 DIAGNOSIS — M5430 Sciatica, unspecified side: Secondary | ICD-10-CM

## 2013-09-26 DIAGNOSIS — M545 Low back pain, unspecified: Secondary | ICD-10-CM

## 2013-09-26 HISTORY — PX: BACK SURGERY: SHX140

## 2013-10-17 ENCOUNTER — Telehealth: Payer: Self-pay | Admitting: Internal Medicine

## 2013-10-17 NOTE — Telephone Encounter (Signed)
Pt left v/m asking for a sleep aid to help her sleep at night while she is on the prednisone. Please advise.

## 2013-10-17 NOTE — Telephone Encounter (Signed)
I can work her in Monday late pm (4:15-4:30) or Tuesday late pm if desires.  (10/20/13 or 10/21/13).

## 2013-10-17 NOTE — Telephone Encounter (Signed)
Pt states that the last 2 times she saw you, you told her that all she needed to do was call since she has already seen you for sleeping issue. Please advise

## 2013-10-17 NOTE — Telephone Encounter (Signed)
Will need to see her before calling in sleeping medication.  I can see her Tuesday (10/21/13) - 12:00.

## 2013-10-17 NOTE — Telephone Encounter (Signed)
Pt states that she can not come in on Tuesday at that time (she works in [place]). She asked if there was anything otc that she could try. I recommended asking her pharmacist. Pt states that she will just deal with not sleeping.

## 2013-10-17 NOTE — Telephone Encounter (Signed)
I would still like to see her given her recent issues with her back and now on prednisone.  This is a change for her.

## 2013-10-20 NOTE — Telephone Encounter (Signed)
Pt notified & stated that she can not come at that time. Pt notified to call if she needs to be seen & she could get in with NP if no openings with Dr. Nicki Reaper.

## 2013-11-07 ENCOUNTER — Encounter: Payer: Self-pay | Admitting: Internal Medicine

## 2013-11-07 ENCOUNTER — Ambulatory Visit: Payer: Self-pay | Admitting: Internal Medicine

## 2013-11-10 ENCOUNTER — Encounter: Payer: Self-pay | Admitting: Internal Medicine

## 2013-11-20 ENCOUNTER — Other Ambulatory Visit: Payer: Self-pay | Admitting: *Deleted

## 2013-11-20 MED ORDER — FLUTICASONE PROPIONATE 50 MCG/ACT NA SUSP: 2.0000 | Freq: Every day | NASAL | Status: DC

## 2013-11-23 ENCOUNTER — Emergency Department: Payer: Self-pay | Admitting: Emergency Medicine

## 2013-11-24 ENCOUNTER — Ambulatory Visit: Payer: BC Managed Care – PPO | Admitting: Internal Medicine

## 2013-11-24 ENCOUNTER — Telehealth: Payer: Self-pay | Admitting: Internal Medicine

## 2013-11-24 NOTE — Telephone Encounter (Signed)
Pt unable to make appt today due to a back injury over the weekend. Pt may need to have surgury. Please advise if it is okay to cancel appt for today.msn

## 2013-11-24 NOTE — Telephone Encounter (Signed)
Ok to cancel.  Please move the 2:15 today to Ms Doscher appt.  Thanks

## 2014-01-06 ENCOUNTER — Telehealth: Payer: Self-pay | Admitting: Internal Medicine

## 2014-01-06 ENCOUNTER — Other Ambulatory Visit: Payer: Self-pay | Admitting: *Deleted

## 2014-01-06 ENCOUNTER — Encounter: Payer: Self-pay | Admitting: *Deleted

## 2014-01-06 MED ORDER — PANTOPRAZOLE SODIUM 40 MG PO TBEC: DELAYED_RELEASE_TABLET | ORAL | Status: DC

## 2014-01-06 NOTE — Telephone Encounter (Signed)
Pt called in for refill of pantoprazole (PROTONIX) 40mg .

## 2014-01-06 NOTE — Telephone Encounter (Signed)
Rx refilled & pt notified via mychart

## 2014-01-08 NOTE — Telephone Encounter (Signed)
Unread mychart message mailed to patient 

## 2014-03-06 ENCOUNTER — Ambulatory Visit (INDEPENDENT_AMBULATORY_CARE_PROVIDER_SITE_OTHER): Payer: BC Managed Care – PPO | Admitting: Internal Medicine

## 2014-03-06 ENCOUNTER — Telehealth: Payer: Self-pay | Admitting: Internal Medicine

## 2014-03-06 ENCOUNTER — Encounter: Payer: Self-pay | Admitting: Internal Medicine

## 2014-03-06 VITALS — BP 119/82 | HR 69 | Temp 97.7°F | Ht 64.7 in | Wt 157.8 lb

## 2014-03-06 DIAGNOSIS — F439 Reaction to severe stress, unspecified: Secondary | ICD-10-CM

## 2014-03-06 DIAGNOSIS — Z658 Other specified problems related to psychosocial circumstances: Secondary | ICD-10-CM

## 2014-03-06 DIAGNOSIS — K219 Gastro-esophageal reflux disease without esophagitis: Secondary | ICD-10-CM

## 2014-03-06 DIAGNOSIS — Z9109 Other allergy status, other than to drugs and biological substances: Secondary | ICD-10-CM

## 2014-03-06 DIAGNOSIS — I471 Supraventricular tachycardia: Secondary | ICD-10-CM

## 2014-03-06 DIAGNOSIS — Z91048 Other nonmedicinal substance allergy status: Secondary | ICD-10-CM

## 2014-03-06 MED ORDER — TRAZODONE HCL 50 MG PO TABS: 25.0000 mg | ORAL_TABLET | Freq: Every evening | ORAL | Status: DC | PRN

## 2014-03-06 NOTE — Telephone Encounter (Signed)
Please advise 

## 2014-03-06 NOTE — Telephone Encounter (Signed)
Theresa Buck said she thought Dr. Nicki Reaper wanted her to have labs fairly soon so she could check her cholesterol etc but nothing was noted in her check out notes. In case Dr. Nicki Reaper wants her to come back sooner than April for labs, please call the pt and let her know. Thank you.

## 2014-03-06 NOTE — Progress Notes (Signed)
Pre visit review using our clinic review tool, if applicable. No additional management support is needed unless otherwise documented below in the visit note. 

## 2014-03-07 NOTE — Telephone Encounter (Signed)
Yes, please schedule her for fasting labs within the next 1-2 weeks.   Thanks.

## 2014-03-08 ENCOUNTER — Encounter: Payer: Self-pay | Admitting: Internal Medicine

## 2014-03-08 NOTE — Progress Notes (Signed)
Subjective:    Patient ID: Theresa Buck, female    DOB: 09/20/1955, 58 y.o.   MRN: [id number]  HPI 58 year old female with past history of recurrent allergy and sinus problems who comes in today for a scheduled follow up.  Still dealing with increased stress.  Increased stress at work.  Also some home stress.  Feels she is handling things relatively well.  She is not sleeping.  This has been an ongoing issue for her.  Feels she needs something to help her sleep.  We discussed treatment options.  Eating and drinking well.  No nausea or vomiting.  No bowel change.  Breathing stable.  No problems with increased heart rate or palpitations.  Recently had back surgery.  Has been released.  Has started walking.  Concerned about her weight.     Past Medical History  Diagnosis Date  . H/O sinusitis   . Deviated septum   . Hx of degenerative disc disease   . Deviated septum   . Allergy     recurring sinus problems    Current Outpatient Prescriptions on File Prior to Visit  Medication Sig Dispense Refill  . Ascorbic Acid (VITAMIN C) 100 MG tablet Take 100 mg by mouth daily.    Marland Kitchen azelastine (ASTELIN) 137 MCG/SPRAY nasal spray Place 1 spray into the nose 2 (two) times daily. Use in each nostril as directed 30 mL 1  . BIOTIN PO Take by mouth 2 (two) times daily.    Marland Kitchen diltiazem (CARDIZEM) 30 MG tablet Take 1 tablet (30 mg total) by mouth 4 (four) times daily as needed. 90 tablet 6  . estradiol (ESTRACE) 1 MG tablet Take 1 mg by mouth daily.    . fluticasone (FLONASE) 50 MCG/ACT nasal spray Place 2 sprays into both nostrils daily. 16 g 5  . hydrocortisone (ANUSOL-HC) 25 MG suppository Place 1 suppository (25 mg total) rectally 2 (two) times daily. 14 suppository 0  . mometasone (NASONEX) 50 MCG/ACT nasal spray Place 2 sprays into the nose daily.    . Multiple Vitamin (MULTIVITAMIN) tablet Take 1 tablet by mouth daily.    . pantoprazole (PROTONIX) 40 MG tablet take 1 tablet by mouth once daily 30  tablet 5  . progesterone 200 MG SUPP Place 200 mg vaginally at bedtime.    . propranolol (INDERAL) 20 MG tablet Take 1 tablet (20 mg total) by mouth 3 (three) times daily as needed. 90 tablet 3   No current facility-administered medications on file prior to visit.    Review of Systems Patient denies any headache, lightheadedness or dizziness.  No significant sinus or allergy symptoms.  Previously had intermittent fluttering, heart racing as outlined.  Better.  No problems reported today.   No increased shortness of breath, cough or congestion.  No nausea or vomiting.  Acid reflux controlled.  No abdominal pain or cramping.  No bowel change, such as diarrhea, constipation, BRBPR or melana.  No urine change.  Increased stress as outlined.  Has started walking.  Concerned over weight gain.       Objective:   Physical Exam  Filed Vitals:   03/06/14 1610  BP: 119/82  Pulse: 69  Temp: 97.7 F (8.37 C)   58 year old female in no acute distress.   HEENT:  Nares- clear.  Oropharynx - without lesions. NECK:  Supple.  Nontender.  No audible bruit.  HEART:  Appears to be regular. LUNGS:  No crackles or wheezing audible.  Respirations even  and unlabored.  RADIAL PULSE:  Equal bilaterally.   ABDOMEN:  Soft, nontender.  Bowel sounds present and normal.  No audible abdominal bruit.   EXTREMITIES:  No increased edema present.  DP pulses palpable and equal bilaterally.          Assessment & Plan:  CARDIOVASCULAR.  Had stress test 10/29/09 that was negative for ischemia. Saw Dr Rockey Situ.  See his note for details.  Stable.  Has cardizem and propranolol as outlined.   GI.  Colonoscopy 05/20/07 revealed a 97mm polyp in the transverse colon and internal hemorrhoids.  Recommended follow up colonoscopy in five years.  Due.    Paroxysmal supraventricular tachycardia Improved.  Not a significant issue for her now.  Follow.   Gastroesophageal reflux disease, esophagitis presence not specified Controlled on  protonix.  Follow.   Environmental allergies Controlled now.  Follow.   Stress Increased stress as outlined.  Not sleeping.  Discussed treatment options.  Start trazodone as directed.  Follow.     CONCERN OVER WEIGHT GAIN.  Discussed diet and exercise today.  She has started walking.  Dr Derrel Nip diet.  Follow.    HEALTH MAINTENANCE.  Physical 07/24/13.   Mammogram 11/07/13 - BiRADS I.  GI as outlined.    I spent 25 minutes with the patient and more than 50% of the time was spent in consultation regarding the above.

## 2014-03-09 ENCOUNTER — Encounter: Payer: Self-pay | Admitting: *Deleted

## 2014-03-09 NOTE — Telephone Encounter (Signed)
Sent mychart message

## 2014-03-11 NOTE — Telephone Encounter (Signed)
Left lab appt on voicemail

## 2014-03-17 ENCOUNTER — Other Ambulatory Visit: Payer: BC Managed Care – PPO

## 2014-03-17 ENCOUNTER — Other Ambulatory Visit (INDEPENDENT_AMBULATORY_CARE_PROVIDER_SITE_OTHER): Payer: BC Managed Care – PPO

## 2014-03-17 ENCOUNTER — Encounter: Payer: Self-pay | Admitting: Internal Medicine

## 2014-03-17 ENCOUNTER — Other Ambulatory Visit: Payer: Self-pay | Admitting: Internal Medicine

## 2014-03-17 DIAGNOSIS — R946 Abnormal results of thyroid function studies: Secondary | ICD-10-CM

## 2014-03-17 DIAGNOSIS — E7800 Pure hypercholesterolemia, unspecified: Secondary | ICD-10-CM

## 2014-03-17 DIAGNOSIS — R7989 Other specified abnormal findings of blood chemistry: Secondary | ICD-10-CM

## 2014-03-17 LAB — COMPREHENSIVE METABOLIC PANEL
ALT: 13 U/L (ref 0–35)
AST: 17 U/L (ref 0–37)
Albumin: 4.1 g/dL (ref 3.5–5.2)
Alkaline Phosphatase: 68 U/L (ref 39–117)
BUN: 12 mg/dL (ref 6–23)
CO2: 26 mEq/L (ref 19–32)
Calcium: 9 mg/dL (ref 8.4–10.5)
Chloride: 104 mEq/L (ref 96–112)
Creatinine, Ser: 0.8 mg/dL (ref 0.4–1.2)
GFR: 80.87 mL/min (ref >60.00)
Glucose, Bld: 89 mg/dL (ref 70–99)
Potassium: 4.2 mEq/L (ref 3.5–5.1)
Sodium: 142 mEq/L (ref 135–145)
Total Bilirubin: 0.5 mg/dL (ref 0.2–1.2)
Total Protein: 6.9 g/dL (ref 6.0–8.3)

## 2014-03-17 LAB — LIPID PANEL
Cholesterol: 188 mg/dL (ref 0–200)
HDL: 44.5 mg/dL (ref >39.00)
LDL Cholesterol: 122 mg/dL — ABNORMAL HIGH (ref 0–99)
NonHDL: 143.5
Total CHOL/HDL Ratio: 4
Triglycerides: 107 mg/dL (ref 0.0–149.0)
VLDL: 21.4 mg/dL (ref 0.0–40.0)

## 2014-03-17 LAB — T4, FREE: Free T4: 0.85 ng/dL (ref 0.60–1.60)

## 2014-03-17 LAB — TSH: TSH: 1.77 u[IU]/mL (ref 0.35–4.50)

## 2014-03-17 LAB — T3, FREE: T3, Free: 2.5 pg/mL (ref 2.3–4.2)

## 2014-03-17 NOTE — Progress Notes (Signed)
Order placed for labs.

## 2014-03-18 ENCOUNTER — Encounter: Payer: Self-pay | Admitting: Internal Medicine

## 2014-05-01 ENCOUNTER — Other Ambulatory Visit: Payer: Self-pay | Admitting: *Deleted

## 2014-05-01 MED ORDER — HYDROCORTISONE ACETATE 25 MG RE SUPP: 25.0000 mg | Freq: Two times a day (BID) | RECTAL | Status: DC

## 2014-05-01 NOTE — Telephone Encounter (Signed)
Refilled annusol HC suppositories x 1

## 2014-05-01 NOTE — Telephone Encounter (Signed)
Pt requesting a refill on suppositories. Please advise. Last seen in November.

## 2014-05-19 ENCOUNTER — Encounter: Payer: Self-pay | Admitting: Internal Medicine

## 2014-05-19 DIAGNOSIS — L989 Disorder of the skin and subcutaneous tissue, unspecified: Secondary | ICD-10-CM

## 2014-05-19 NOTE — Telephone Encounter (Signed)
Order placed for referral to dermatology.  

## 2014-05-20 ENCOUNTER — Encounter: Payer: Self-pay | Admitting: Internal Medicine

## 2014-05-27 ENCOUNTER — Encounter: Payer: Self-pay | Admitting: Internal Medicine

## 2014-07-10 ENCOUNTER — Telehealth: Payer: Self-pay | Admitting: Internal Medicine

## 2014-07-10 ENCOUNTER — Ambulatory Visit (INDEPENDENT_AMBULATORY_CARE_PROVIDER_SITE_OTHER): Payer: BLUE CROSS/BLUE SHIELD | Admitting: Nurse Practitioner

## 2014-07-10 ENCOUNTER — Encounter: Payer: Self-pay | Admitting: Nurse Practitioner

## 2014-07-10 VITALS — BP 110/72 | HR 67 | Temp 98.1°F | Resp 14 | Ht 64.7 in | Wt 156.4 lb

## 2014-07-10 DIAGNOSIS — R198 Other specified symptoms and signs involving the digestive system and abdomen: Secondary | ICD-10-CM

## 2014-07-10 DIAGNOSIS — R6889 Other general symptoms and signs: Secondary | ICD-10-CM

## 2014-07-10 MED ORDER — AZITHROMYCIN 250 MG PO TABS: ORAL_TABLET | ORAL | Status: DC

## 2014-07-10 NOTE — Progress Notes (Signed)
Pre visit review using our clinic review tool, if applicable. No additional management support is needed unless otherwise documented below in the visit note. 

## 2014-07-10 NOTE — Progress Notes (Signed)
   Subjective:    Patient ID: Theresa Buck, female    DOB: 21-Sep-1955, 59 y.o.   MRN: [id number]  HPI  [name] is a 59 yo female with a CC of thick mucous in throat and swollen left side of throat x 3 days.   1) 3 days of the feeling that her left side of her throat is swollen on the left.  Aspirin- Helpful Flonase- in the mornings  Allergy medicine OTC- daily   Review of Systems  Constitutional: Negative for fever, chills, diaphoresis and fatigue.  HENT: Negative for postnasal drip, rhinorrhea, sinus pressure, sneezing, sore throat and trouble swallowing.        Thick mucous in throat, unable to cough up she reports  Respiratory: Negative for chest tightness, shortness of breath and wheezing.   Cardiovascular: Negative for chest pain, palpitations and leg swelling.  Gastrointestinal: Negative for nausea, vomiting and diarrhea.  Skin: Negative for rash.  Neurological: Negative for dizziness, weakness, numbness and headaches.  Psychiatric/Behavioral: The patient is not nervous/anxious.       Objective:   Physical Exam  Constitutional: She is oriented to person, place, and time. She appears well-developed and well-nourished. No distress.  BP 110/72 mmHg  Pulse 67  Temp(Src) 98.1 F (36.7 C) (Oral)  Resp 14  Ht 5' 4.7" (1.643 m)  Wt 156 lb 6.4 oz (70.943 kg)  BMI 26.28 kg/m2  SpO2 98%   HENT:  Head: Normocephalic and atraumatic.  Right Ear: External ear normal.  Left Ear: External ear normal.  Cardiovascular: Normal rate, regular rhythm and normal heart sounds.  Exam reveals no gallop and no friction rub.   No murmur heard. Pulmonary/Chest: Effort normal and breath sounds normal. No respiratory distress. She has no wheezes. She has no rales. She exhibits no tenderness.  Neurological: She is alert and oriented to person, place, and time. No cranial nerve deficit. She exhibits normal muscle tone. Coordination normal.  Skin: Skin is warm and dry. No rash noted. She is  not diaphoretic.  Psychiatric: She has a normal mood and affect. Her behavior is normal. Judgment and thought content normal.      Assessment & Plan:

## 2014-07-10 NOTE — Telephone Encounter (Signed)
Appt with Morey Hummingbird today, FYI

## 2014-07-10 NOTE — Telephone Encounter (Signed)
Patient Name: Theresa Buck DOB: 06/15/1955 Initial Comment [name] states she has seasonal allergies, treating with spray. Now feels like throat is swollen with pain, comes and goes. Nurse Assessment Nurse: Theresa Ramp, RN, Theresa Buck Date/Time (Eastern Time): 07/10/2014 10:11:22 AM Confirm and document reason for call. If symptomatic, describe symptoms. ---[name] states her throat is hurting and irritated especially when she swallows the last 3 days. She is having sinus drainage. Has the patient traveled out of the country within the last 30 days? ---No Does the patient require triage? ---Yes Related visit to physician within the last 2 weeks? ---No Does the PT have any chronic conditions? (i.e. diabetes, asthma, etc.) ---Yes List chronic conditions. ---Allergies, Guidelines Guideline Title Affirmed Question Affirmed Notes Nasal Allergies (Hay Fever) [1] Lots of yellow or green discharge from nose AND [2] present > 3 days Final Disposition User See Physician within 24 Hours Theresa Ramp, RN, Theresa Buck Comments Appt scheduled for 430 with Theresa Buck.

## 2014-07-10 NOTE — Patient Instructions (Signed)
Flonase in the morning  Gargling with warm salt water   Only take the Z-pack if worsening or fever.

## 2014-07-15 ENCOUNTER — Other Ambulatory Visit: Payer: Self-pay | Admitting: *Deleted

## 2014-07-15 MED ORDER — PANTOPRAZOLE SODIUM 40 MG PO TBEC: DELAYED_RELEASE_TABLET | ORAL | Status: DC

## 2014-07-16 DIAGNOSIS — R198 Other specified symptoms and signs involving the digestive system and abdomen: Secondary | ICD-10-CM | POA: Insufficient documentation

## 2014-07-16 NOTE — Assessment & Plan Note (Signed)
No good way to explain what the patient described. Flonase in the morning, and gargling with warm salt water for throat mucous. Sent in [place] and told her to only take it if worsens or fever 101 or greater.

## 2014-07-29 ENCOUNTER — Encounter: Payer: Self-pay | Admitting: Internal Medicine

## 2014-08-05 ENCOUNTER — Other Ambulatory Visit: Payer: BC Managed Care – PPO

## 2014-08-07 ENCOUNTER — Encounter: Payer: Self-pay | Admitting: Internal Medicine

## 2014-08-07 ENCOUNTER — Ambulatory Visit (INDEPENDENT_AMBULATORY_CARE_PROVIDER_SITE_OTHER): Payer: BLUE CROSS/BLUE SHIELD | Admitting: Internal Medicine

## 2014-08-07 VITALS — BP 110/70 | HR 69 | Temp 97.8°F | Ht 66.5 in | Wt 156.5 lb

## 2014-08-07 DIAGNOSIS — K219 Gastro-esophageal reflux disease without esophagitis: Secondary | ICD-10-CM | POA: Diagnosis not present

## 2014-08-07 DIAGNOSIS — I471 Supraventricular tachycardia: Secondary | ICD-10-CM | POA: Diagnosis not present

## 2014-08-07 DIAGNOSIS — Z91048 Other nonmedicinal substance allergy status: Secondary | ICD-10-CM

## 2014-08-07 DIAGNOSIS — M79672 Pain in left foot: Secondary | ICD-10-CM

## 2014-08-07 DIAGNOSIS — Z1239 Encounter for other screening for malignant neoplasm of breast: Secondary | ICD-10-CM

## 2014-08-07 DIAGNOSIS — Z0000 Encounter for general adult medical examination without abnormal findings: Secondary | ICD-10-CM

## 2014-08-07 DIAGNOSIS — F439 Reaction to severe stress, unspecified: Secondary | ICD-10-CM

## 2014-08-07 DIAGNOSIS — Z9109 Other allergy status, other than to drugs and biological substances: Secondary | ICD-10-CM

## 2014-08-07 DIAGNOSIS — Z658 Other specified problems related to psychosocial circumstances: Secondary | ICD-10-CM

## 2014-08-07 NOTE — Progress Notes (Signed)
Patient ID: Theresa Buck, female   DOB: [birth year], 59 y.o.   MRN: [id number]   Subjective:    Patient ID: Theresa Buck, female    DOB: 12-21-1955, 59 y.o.   MRN: [id number]  HPI  Patient here for a physical.  Son returned 05/2014.  Stress is better.  Feels better.  Still increased stress with work.  Is walking.  Watches her diet.  Was having some left ankle pain.  Better now.  Some pain top of foot.  Present for two months.  Intermittent.  Discussed supports and good support shoes.  Discussed referral.  She declines.  Eating and drinking well.  Bowels stable.  No significant problems with hemorrhoids.  Occasional flares.    Past Medical History  Diagnosis Date  . H/O sinusitis   . Deviated septum   . Hx of degenerative disc disease   . Deviated septum   . Allergy     recurring sinus problems    Outpatient Encounter Prescriptions as of 08/07/2014  Medication Sig  . Ascorbic Acid (VITAMIN C) 100 MG tablet Take 100 mg by mouth daily.  Marland Kitchen azelastine (ASTELIN) 137 MCG/SPRAY nasal spray Place 1 spray into the nose 2 (two) times daily. Use in each nostril as directed  . BIOTIN PO Take by mouth 2 (two) times daily.  Marland Kitchen diltiazem (CARDIZEM) 30 MG tablet Take 1 tablet (30 mg total) by mouth 4 (four) times daily as needed.  Marland Kitchen estradiol (ESTRACE) 1 MG tablet Take 1 mg by mouth daily.  . fluticasone (FLONASE) 50 MCG/ACT nasal spray Place 2 sprays into both nostrils daily.  . hydrocortisone (ANUSOL-HC) 25 MG suppository Place 1 suppository (25 mg total) rectally 2 (two) times daily.  . mometasone (NASONEX) 50 MCG/ACT nasal spray Place 2 sprays into the nose daily.  . Multiple Vitamin (MULTIVITAMIN) tablet Take 1 tablet by mouth daily.  . pantoprazole (PROTONIX) 40 MG tablet take 1 tablet by mouth once daily  . progesterone 200 MG SUPP Place 200 mg vaginally at bedtime.  . propranolol (INDERAL) 20 MG tablet Take 1 tablet (20 mg total) by mouth 3 (three) times daily as needed.  .  traZODone (DESYREL) 50 MG tablet Take 0.5-1 tablets (25-50 mg total) by mouth at bedtime as needed for sleep.  . [DISCONTINUED] azithromycin (ZITHROMAX) 250 MG tablet Take 2 tablets by mouth on day 1 and one tablet by mouth each day after until gone.    Review of Systems  Constitutional: Negative for appetite change and unexpected weight change.  HENT: Negative for congestion and sinus pressure.   Eyes: Negative for pain and visual disturbance.  Respiratory: Negative for cough, chest tightness and shortness of breath.   Cardiovascular: Negative for chest pain, palpitations and leg swelling.  Gastrointestinal: Negative for nausea, vomiting, abdominal pain and diarrhea.  Genitourinary: Negative for frequency and difficulty urinating.  Musculoskeletal: Negative for back pain and joint swelling.  Skin: Negative for color change and rash.  Neurological: Negative for dizziness and headaches.  Hematological: Negative for adenopathy. Does not bruise/bleed easily.  Psychiatric/Behavioral: Negative for dysphoric mood and agitation.       Objective:    Physical Exam  Constitutional: She is oriented to person, place, and time. She appears well-developed and well-nourished.  HENT:  Nose: Nose normal.  Mouth/Throat: Oropharynx is clear and moist.  Eyes: Right eye exhibits no discharge. Left eye exhibits no discharge. No scleral icterus.  Neck: Neck supple. No thyromegaly present.  Cardiovascular: Normal rate and regular rhythm.  Pulmonary/Chest: Breath sounds normal. No accessory muscle usage. No tachypnea. No respiratory distress. She has no decreased breath sounds. She has no wheezes. She has no rhonchi. Right breast exhibits no inverted nipple, no mass, no nipple discharge and no tenderness (no axillary adenopathy). Left breast exhibits no inverted nipple, no mass, no nipple discharge and no tenderness (no axilarry adenopathy).  Abdominal: Soft. Bowel sounds are normal. There is no tenderness.    Musculoskeletal: She exhibits no edema or tenderness.  Lymphadenopathy:    She has no cervical adenopathy.  Neurological: She is alert and oriented to person, place, and time.  Skin: Skin is warm. No rash noted.  Psychiatric: She has a normal mood and affect. Her behavior is normal.    BP 110/70 mmHg  Pulse 69  Temp(Src) 97.8 F (36.6 C) (Oral)  Ht 5' 6.5" (1.689 m)  Wt 156 lb 8 oz (70.988 kg)  BMI 24.88 kg/m2  SpO2 97% Wt Readings from Last 3 Encounters:  08/07/14 156 lb 8 oz (70.988 kg)  07/10/14 156 lb 6.4 oz (70.943 kg)  03/06/14 157 lb 12 oz (71.555 kg)     Lab Results  Component Value Date   WBC 7.9 08/07/2013   HGB 14.6 08/07/2013   HCT 43.0 08/07/2013   PLT 242.0 08/07/2013   GLUCOSE 89 03/17/2014   CHOL 188 03/17/2014   TRIG 107.0 03/17/2014   HDL 44.50 03/17/2014   LDLCALC 122* 03/17/2014   ALT 13 03/17/2014   AST 17 03/17/2014   NA 142 03/17/2014   K 4.2 03/17/2014   CL 104 03/17/2014   CREATININE 0.8 03/17/2014   BUN 12 03/17/2014   CO2 26 03/17/2014   TSH 1.77 03/17/2014       Assessment & Plan:   Problem List Items Addressed This Visit    Environmental allergies    Controlled on current regimen.  Follow.        GERD (gastroesophageal reflux disease)    On protonix.  Stable.  Controlled.        Health care maintenance    Mammogram 11/07/13 - [facility] I.  PAP 06/06/11 - negative with negative HPV.  Also pap 07/19/12 negativ with negative HPV.  Colonoscopy 05/19/12 - recommended f/u in 5 years.        Left foot pain    Discussed support shoes and supports.  Discussed referral.  She wants to monitor for now.        Paroxysmal supraventricular tachycardia    Stable.  Has been seeing Dr Rockey Situ.  Has propranolol and cardizem to take prn.  No problems reported.        Stress    Still with stress but doing better.  Follow.  On trazodone to help her sleep.  Follow.        Other Visit Diagnoses    Breast cancer screening    -  Primary     Relevant Orders    MM DIGITAL SCREENING BILATERAL      I spent 25 minutes with the patient and more than 50% of the time was spent in consultation regarding the above.     Einar Pheasant, MD

## 2014-08-07 NOTE — Progress Notes (Signed)
Pre visit review using our clinic review tool, if applicable. No additional management support is needed unless otherwise documented below in the visit note. 

## 2014-08-16 ENCOUNTER — Encounter: Payer: Self-pay | Admitting: Internal Medicine

## 2014-08-16 DIAGNOSIS — Z0000 Encounter for general adult medical examination without abnormal findings: Secondary | ICD-10-CM | POA: Insufficient documentation

## 2014-08-16 DIAGNOSIS — M79672 Pain in left foot: Secondary | ICD-10-CM | POA: Insufficient documentation

## 2014-08-16 NOTE — Assessment & Plan Note (Signed)
Discussed support shoes and supports.  Discussed referral.  She wants to monitor for now.

## 2014-08-16 NOTE — Assessment & Plan Note (Signed)
Controlled on current regimen.  Follow.  

## 2014-08-16 NOTE — Assessment & Plan Note (Signed)
Still with stress but doing better.  Follow.  On trazodone to help her sleep.  Follow.

## 2014-08-16 NOTE — Assessment & Plan Note (Signed)
On protonix.  Stable.  Controlled.

## 2014-08-16 NOTE — Assessment & Plan Note (Signed)
Stable.  Has been seeing Dr Rockey Situ.  Has propranolol and cardizem to take prn.  No problems reported.

## 2014-08-16 NOTE — Assessment & Plan Note (Signed)
Mammogram 11/07/13 - Birads I.  PAP 06/06/11 - negative with negative HPV.  Also pap 07/19/12 negativ with negative HPV.  Colonoscopy 05/19/12 - recommended f/u in 5 years.

## 2014-11-03 ENCOUNTER — Other Ambulatory Visit: Payer: Self-pay

## 2014-11-03 MED ORDER — HYDROCORTISONE ACETATE 25 MG RE SUPP
25.0000 mg | Freq: Two times a day (BID) | RECTAL | Status: DC
Start: 2014-11-03 — End: 2015-03-15

## 2014-11-03 NOTE — Telephone Encounter (Signed)
Please advise refill? 

## 2014-11-03 NOTE — Telephone Encounter (Signed)
I am ok to refill x 1, but if persistent need - needs to be reevaluated.

## 2014-11-13 ENCOUNTER — Ambulatory Visit
Admission: RE | Admit: 2014-11-13 | Discharge: 2014-11-13 | Disposition: A | Discharge status: Home / Self Care | Payer: BLUE CROSS/BLUE SHIELD | Source: Ambulatory Visit | Attending: Internal Medicine | Admitting: Internal Medicine

## 2014-11-13 DIAGNOSIS — Z1231 Encounter for screening mammogram for malignant neoplasm of breast: Secondary | ICD-10-CM | POA: Diagnosis not present

## 2014-11-13 DIAGNOSIS — Z1239 Encounter for other screening for malignant neoplasm of breast: Secondary | ICD-10-CM

## 2014-11-25 ENCOUNTER — Other Ambulatory Visit: Payer: Self-pay

## 2014-11-25 MED ORDER — FLUTICASONE PROPIONATE 50 MCG/ACT NA SUSP: 2.0000 | Freq: Every day | NASAL | Status: DC

## 2014-11-25 MED ORDER — TRAZODONE HCL 50 MG PO TABS: 25.0000 mg | ORAL_TABLET | Freq: Every evening | ORAL | Status: DC | PRN

## 2015-02-07 ENCOUNTER — Other Ambulatory Visit: Payer: Self-pay | Admitting: Internal Medicine

## 2015-02-08 ENCOUNTER — Ambulatory Visit: Payer: BLUE CROSS/BLUE SHIELD | Admitting: Internal Medicine

## 2015-02-08 ENCOUNTER — Other Ambulatory Visit: Payer: Self-pay | Admitting: *Deleted

## 2015-02-08 MED ORDER — PANTOPRAZOLE SODIUM 40 MG PO TBEC: DELAYED_RELEASE_TABLET | ORAL | Status: DC

## 2015-02-12 ENCOUNTER — Encounter: Payer: Self-pay | Admitting: Internal Medicine

## 2015-02-12 ENCOUNTER — Ambulatory Visit (INDEPENDENT_AMBULATORY_CARE_PROVIDER_SITE_OTHER): Payer: BLUE CROSS/BLUE SHIELD | Admitting: Internal Medicine

## 2015-02-12 VITALS — BP 100/80 | HR 64 | Temp 97.7°F | Resp 18 | Ht 66.5 in | Wt 156.0 lb

## 2015-02-12 DIAGNOSIS — I471 Supraventricular tachycardia: Secondary | ICD-10-CM

## 2015-02-12 DIAGNOSIS — F439 Reaction to severe stress, unspecified: Secondary | ICD-10-CM

## 2015-02-12 DIAGNOSIS — Z658 Other specified problems related to psychosocial circumstances: Secondary | ICD-10-CM

## 2015-02-12 DIAGNOSIS — R1013 Epigastric pain: Secondary | ICD-10-CM

## 2015-02-12 DIAGNOSIS — K219 Gastro-esophageal reflux disease without esophagitis: Secondary | ICD-10-CM

## 2015-02-12 DIAGNOSIS — Z91048 Other nonmedicinal substance allergy status: Secondary | ICD-10-CM

## 2015-02-12 DIAGNOSIS — Z9109 Other allergy status, other than to drugs and biological substances: Secondary | ICD-10-CM

## 2015-02-12 NOTE — Progress Notes (Signed)
Patient ID: Theresa Buck, female   DOB: 1955/06/02, 59 y.o.   MRN: [id number]   Subjective:    Patient ID: Theresa Buck, female    DOB: September 27, 1955, 59 y.o.   MRN: [id number]  HPI  Patient with past history of allergies and sinus issues who comes in today for a scheduled follow up.  She is doing relatively well.  Handling stress relativley well.  No increased cough or congestion.  Is on protonix for acid reflux.  Had an episode in the last week of epigastric discomfort.  Also some diarrhea and nausea.  Tmax 100.3.  This has resolved.  Bowels back to normal now.  No fever.  No abdominal pain now.  No urine change.  No chest pain or tightness.  No sob.     Past Medical History  Diagnosis Date  . H/O sinusitis   . Deviated septum   . Hx of degenerative disc disease   . Deviated septum   . Allergy     recurring sinus problems   No past surgical history on file. Family History  Problem Relation Age of Onset  . ALS Father   . Diabetes Brother     multiple  . Brain cancer Mother   . Heart disease Son     myocardial infarction, s/p CABG   Social History   Social History  . Marital Status: Widowed    Spouse Name: N/A  . Number of Children: 3  . Years of Education: N/A   Social History Main Topics  . Smoking status: Never Smoker   . Smokeless tobacco: Never Used  . Alcohol Use: No  . Drug Use: No  . Sexual Activity: Not Asked   Other Topics Concern  . None   Social History Narrative    Outpatient Encounter Prescriptions as of 02/12/2015  Medication Sig  . Ascorbic Acid (VITAMIN C) 100 MG tablet Take 100 mg by mouth daily.  Marland Kitchen azelastine (ASTELIN) 137 MCG/SPRAY nasal spray Place 1 spray into the nose 2 (two) times daily. Use in each nostril as directed  . BIOTIN PO Take by mouth 2 (two) times daily.  Marland Kitchen diltiazem (CARDIZEM) 30 MG tablet Take 1 tablet (30 mg total) by mouth 4 (four) times daily as needed.  Marland Kitchen estradiol (ESTRACE) 1 MG tablet Take 1 mg by mouth  daily.  . fluticasone (FLONASE) 50 MCG/ACT nasal spray Place 2 sprays into both nostrils daily.  . hydrocortisone (ANUSOL-HC) 25 MG suppository Place 1 suppository (25 mg total) rectally 2 (two) times daily.  . mometasone (NASONEX) 50 MCG/ACT nasal spray Place 2 sprays into the nose daily.  . Multiple Vitamin (MULTIVITAMIN) tablet Take 1 tablet by mouth daily.  . pantoprazole (PROTONIX) 40 MG tablet take 1 tablet by mouth once daily  . progesterone 200 MG SUPP Place 200 mg vaginally at bedtime.  . propranolol (INDERAL) 20 MG tablet Take 1 tablet (20 mg total) by mouth 3 (three) times daily as needed.  . traZODone (DESYREL) 50 MG tablet Take 0.5-1 tablets (25-50 mg total) by mouth at bedtime as needed for sleep.   No facility-administered encounter medications on file as of 02/12/2015.    Review of Systems  Constitutional: Negative for appetite change and unexpected weight change.  HENT: Negative for congestion and sinus pressure.   Eyes: Negative for discharge and visual disturbance.  Respiratory: Negative for cough, chest tightness and shortness of breath.   Cardiovascular: Negative for chest pain, palpitations and leg swelling.  Gastrointestinal:  Previous epigastric pain and discomfort.  Resolved.  No nausea or vomiting now.    Genitourinary: Negative for dysuria and difficulty urinating.  Musculoskeletal: Negative for back pain and joint swelling.  Skin: Negative for color change and rash.  Neurological: Negative for dizziness, light-headedness and headaches.  Psychiatric/Behavioral: Negative for dysphoric mood and agitation.       Objective:    Physical Exam  Constitutional: She appears well-developed and well-nourished. No distress.  HENT:  Nose: Nose normal.  Mouth/Throat: Oropharynx is clear and moist.  Eyes: Conjunctivae are normal. Right eye exhibits no discharge. Left eye exhibits no discharge.  Neck: Neck supple. No thyromegaly present.  Cardiovascular: Normal  rate and regular rhythm.   Pulmonary/Chest: Breath sounds normal. No respiratory distress. She has no wheezes.  Abdominal: Soft. Bowel sounds are normal. There is no tenderness.  Musculoskeletal: She exhibits no edema or tenderness.  Lymphadenopathy:    She has no cervical adenopathy.  Skin: No rash noted. No erythema.  Psychiatric: She has a normal mood and affect. Her behavior is normal.    BP 100/80 mmHg  Pulse 64  Temp(Src) 97.7 F (36.5 C) (Oral)  Resp 18  Ht 5' 6.5" (1.689 m)  Wt 156 lb (70.761 kg)  BMI 24.80 kg/m2  SpO2 97% Wt Readings from Last 3 Encounters:  02/12/15 156 lb (70.761 kg)  08/07/14 156 lb 8 oz (70.988 kg)  07/10/14 156 lb 6.4 oz (70.943 kg)     Lab Results  Component Value Date   WBC 7.9 08/07/2013   HGB 14.6 08/07/2013   HCT 43.0 08/07/2013   PLT 242.0 08/07/2013   GLUCOSE 89 03/17/2014   CHOL 188 03/17/2014   TRIG 107.0 03/17/2014   HDL 44.50 03/17/2014   LDLCALC 122* 03/17/2014   ALT 13 03/17/2014   AST 17 03/17/2014   NA 142 03/17/2014   K 4.2 03/17/2014   CL 104 03/17/2014   CREATININE 0.8 03/17/2014   BUN 12 03/17/2014   CO2 26 03/17/2014   TSH 1.77 03/17/2014    Mm Digital Screening Bilateral  11/13/2014  CLINICAL DATA:  Screening. EXAM: DIGITAL SCREENING BILATERAL MAMMOGRAM WITH CAD COMPARISON:  Previous exam(s). ACR Breast Density Category c: The breast tissue is heterogeneously dense, which may obscure small masses. FINDINGS: There are no findings suspicious for malignancy. Images were processed with CAD. IMPRESSION: No mammographic evidence of malignancy. A result letter of this screening mammogram will be mailed directly to the patient. RECOMMENDATION: Screening mammogram in one year. (Code:SM-B-01Y) BI-RADS CATEGORY  1: Negative. Electronically Signed   By: Claudie Revering M.D.   On: 11/13/2014 15:04       Assessment & Plan:   Problem List Items Addressed This Visit    Abdominal pain, epigastric    Pain and symptoms as  outlined.  Resolved now.  Discussed with her regarding possible etiologies.  Question of viral syndrome.  Also, consider possible gallbladder etiology.  Discussed abdominal ultrasound and obtaining labs.  She wants to check with her insurance company before ordering.  Will notify me if she wants me to proceed with further w/up.  Symptoms have resolved now.        Environmental allergies    Controlled on current medication regimen.  Follow.        GERD (gastroesophageal reflux disease)    On protonix.  Had the episode as outlined (recently).  Unclear etiology.  Continue protonix.  Plan further w/up as outlined.        Paroxysmal supraventricular  tachycardia ([place]) - Primary    Has seen Dr Rockey Situ.  Has propranolol and cardizem to take prn.  Doing well.  Follow.        Stress    Overall improved.  Follow.  Discussed with her today.            Einar Pheasant, MD

## 2015-02-12 NOTE — Progress Notes (Signed)
Pre-visit discussion using our clinic review tool. No additional management support is needed unless otherwise documented below in the visit note.  

## 2015-02-14 ENCOUNTER — Encounter: Payer: Self-pay | Admitting: Internal Medicine

## 2015-02-14 DIAGNOSIS — R1013 Epigastric pain: Secondary | ICD-10-CM | POA: Insufficient documentation

## 2015-02-14 NOTE — Assessment & Plan Note (Signed)
Overall improved.  Follow.  Discussed with her today.

## 2015-02-14 NOTE — Assessment & Plan Note (Addendum)
On protonix.  Had the episode as outlined (recently).  Unclear etiology.  Continue protonix.  Plan further w/up as outlined.

## 2015-02-14 NOTE — Assessment & Plan Note (Signed)
Controlled on current medication regimen.  Follow.   

## 2015-02-14 NOTE — Assessment & Plan Note (Signed)
Pain and symptoms as outlined.  Resolved now.  Discussed with her regarding possible etiologies.  Question of viral syndrome.  Also, consider possible gallbladder etiology.  Discussed abdominal ultrasound and obtaining labs.  She wants to check with her insurance company before ordering.  Will notify me if she wants me to proceed with further w/up.  Symptoms have resolved now.

## 2015-02-14 NOTE — Assessment & Plan Note (Signed)
Has seen Dr Rockey Situ.  Has propranolol and cardizem to take prn.  Doing well.  Follow.

## 2015-03-15 ENCOUNTER — Other Ambulatory Visit: Payer: Self-pay | Admitting: Internal Medicine

## 2015-03-17 ENCOUNTER — Encounter: Payer: Self-pay | Admitting: Internal Medicine

## 2015-04-22 ENCOUNTER — Ambulatory Visit (INDEPENDENT_AMBULATORY_CARE_PROVIDER_SITE_OTHER): Payer: BLUE CROSS/BLUE SHIELD | Admitting: Podiatry

## 2015-04-22 ENCOUNTER — Ambulatory Visit (INDEPENDENT_AMBULATORY_CARE_PROVIDER_SITE_OTHER): Payer: BLUE CROSS/BLUE SHIELD

## 2015-04-22 ENCOUNTER — Encounter: Payer: Self-pay | Admitting: Podiatry

## 2015-04-22 DIAGNOSIS — R52 Pain, unspecified: Secondary | ICD-10-CM

## 2015-04-22 DIAGNOSIS — M779 Enthesopathy, unspecified: Secondary | ICD-10-CM

## 2015-04-22 NOTE — Patient Instructions (Signed)
Posterior Tibial Tendon Tendinitis With Rehab Tendonitis is a condition that is characterized by inflammation of a tendon or the lining (sheath) that surrounds it. The inflammation is usually caused by damage to the tendon, such as a tendon tear (strain). Sprains are classified into three categories. Grade 1 sprains cause pain, but the tendon is not lengthened. Grade 2 sprains include a lengthened ligament due to the ligament being stretched or partially ruptured. With grade 2 sprains there is still function, although the function may be diminished. Grade 3 sprains are characterized by a complete tear of the tendon or muscle, and function is usually impaired. Posterior tibialis tendonitis is tendonitis of the posterior tibial tendon, which attaches muscles of the lower leg to the foot. The posterior tibial tendon is located in the back of the ankle and helps the body straighten (plantar flex) and rotate inward (medially rotate) the ankle. SYMPTOMS   Pain, tenderness, swelling, warmth, and/or redness over the back of the inner ankle at the posterior tibial tendon or the inner part of the mid-foot.  Pain that worsens with plantar flexion or medial rotation of the ankle.  A crackling sound (crepitation) when the tendon is moved or touched. CAUSES  Posterior tibial tendonitis occurs when damage to the posterior tibial tendon starts an inflammatory response. Common mechanisms of injury include:  Degenerative (occurs with aging) processes that weaken the tendon and make it more susceptible to injury.  Stress placed on the tendon from an increase in the intensity, frequency, or duration of training.  Direct trauma to the ankle.  Returning to activity before a previous ankle injury is allowed to heal. RISK INCREASES WITH:  Activities that involve repetitive and/or stressful plantar flexion (jumping, kicking, or running up/down hills).  Poor strength and flexibility.  Flat feet.  Previous injury to  the foot, ankle, or leg. PREVENTION   Warm up and stretch properly before activity.  Allow for adequate recovery between workouts.  Maintain physical fitness:  Strength, flexibility, and endurance.  Cardiovascular fitness.  Learn and use proper technique. When possible, have a coach correct improper technique.  Complete rehabilitation from a previous foot, ankle, or leg injury.  If you have flat feet, wear arch supports (orthotics). PROGNOSIS  If treated properly, the symptoms of tendonitis usually resolve within 6 weeks. This period may be shorter for injuries caused by direct trauma. RELATED COMPLICATIONS   Prolonged healing time, if improperly treated or reinjured.  Recurrent symptoms that result in a chronic problem.  Partial or complete tendon tear (rupture) requiring surgery. TREATMENT  Treatment initially involves the use of ice and medication to help reduce pain and inflammation. The use of strengthening and stretching exercises may help reduce pain with activity. These exercises may be performed at home or with referral to a therapist. Often times, your caregiver will recommend immobilizing the ankle to allow the tendon to heal. If you have flat feet, you may be advised to wear orthotic arch supports. If symptoms persist for greater than 6 months despite nonsurgical (conservative) treatment, then surgery may be recommended. MEDICATION   If pain medication is necessary, then nonsteroidal anti-inflammatory medications, such as aspirin and ibuprofen, or other minor pain relievers, such as acetaminophen, are often recommended.  Do not take pain medication for 7 days before surgery.  Prescription pain relievers may be given if deemed necessary by your caregiver. Use only as directed and only as much as you need.  Corticosteroid injections may be given by your caregiver. These injections should   be reserved for the most serious cases because they may only be given a certain  number of times. HEAT AND COLD  Cold treatment (icing) relieves pain and reduces inflammation. Cold treatment should be applied for 10 to 15 minutes every 2 to 3 hours for inflammation and pain and immediately after any activity that aggravates your symptoms. Use ice packs or massage the area with a piece of ice (ice massage).  Heat treatment may be used prior to performing the stretching and strengthening activities prescribed by your caregiver, physical therapist, or athletic trainer. Use a heat pack or soak the injury in warm water. SEEK MEDICAL CARE IF:  Treatment seems to offer no benefit, or the condition worsens.  Any medications produce adverse side effects. EXERCISES RANGE OF MOTION (ROM) AND STRETCHING EXERCISES - Posterior Tibial Tendon Tendinitis These exercises may help you when beginning to rehabilitate your injury. Your symptoms may resolve with or without further involvement from your physician, physical therapist or athletic trainer. While completing these exercises, remember:   Restoring tissue flexibility helps normal motion to return to the joints. This allows healthier, less painful movement and activity.  An effective stretch should be held for at least 30 seconds.  A stretch should never be painful. You should only feel a gentle lengthening or release in the stretched tissue. RANGE OF MOTION - Ankle Plantar Flexion   Sit with your right / left leg crossed over your opposite knee.  Use your opposite hand to pull the top of your foot and toes toward you.  You should feel a gentle stretch on the top of your foot/ankle. Hold this position for __________ seconds. Repeat __________ times. Complete this exercise __________ times per day.  RANGE OF MOTION - Ankle Eversion   Sit with your right / left ankle crossed over your opposite knee.  Grip your foot with your opposite hand, placing your thumb on the top of your foot and your fingers across the bottom of your  foot.  Gently push your foot downward with a slight rotation so your littlest toes rise slightly.  You should feel a gentle stretch on the inside of your ankle. Hold the stretch for __________ seconds. Repeat __________ times. Complete this exercise __________ times per day.  RANGE OF MOTION - Ankle Inversion   Sit with your right / left ankle crossed over your opposite knee.  Grip your foot with your opposite hand, placing your thumb on the bottom of your foot and your fingers across the top of your foot.  Gently pull your foot so the smallest toe comes toward you and your thumb pushes the inside of the ball of your foot away from you.  You should feel a gentle stretch on the outside of your ankle. Hold the stretch for __________ seconds. Repeat __________ times. Complete this exercise __________ times per day.  RANGE OF MOTION - Dorsi/Plantar Flexion  While sitting with your right / left knee straight, draw the top of your foot upward by flexing your ankle. Then reverse the motion, pointing your toes downward.  Hold each position for __________ seconds.  After completing your first set of exercises, repeat this exercise with your knee bent. Repeat __________ times. Complete this exercise __________ times per day.  RANGE OF MOTION - Ankle Alphabet  Imagine your right / left big toe is a pen.  Keeping your hip and knee still, write out the entire alphabet with your "pen." Make the letters as large as you can without   increasing any discomfort. Repeat __________ times. Complete this exercise __________ times per day.  STRETCH - Gastrocsoleus   Sit with your right / left leg extended. Holding onto both ends of a belt or towel, loop it around the ball of your foot.  Keeping your right / left ankle and foot relaxed and your knee straight, pull your foot and ankle toward you using the belt/towel.  You should feel a gentle stretch behind your calf or knee. Hold this position for  __________ seconds. Repeat __________ times. Complete this exercise __________ times per day.  STRETCH - Gastroc, Standing   Place hands on wall.  Extend right / left leg, keeping the front knee somewhat bent.  Slightly point your toes inward on your back foot.  Keeping your right / left heel on the floor and your knee straight, shift your weight toward the wall, not allowing your back to arch.  You should feel a gentle stretch in the right / left calf. Hold this position for __________ seconds. Repeat __________ times. Complete this stretch __________ times per day. STRETCH - Soleus, Standing   Place hands on wall.  Extend right / left leg, keeping the other knee somewhat bent.  Slightly point your toes inward on your back foot.  Keep your right / left heel on the floor, bend your back knee, and slightly shift your weight over the back leg so that you feel a gentle stretch deep in your back calf.  Hold this position for __________ seconds. Repeat __________ times. Complete this stretch __________ times per day. STRENGTHENING EXERCISES - Posterior Tibial Tendon Tendinitis These exercises may help you when beginning to rehabilitate your injury. They may resolve your symptoms with or without further involvement from your physician, physical therapist, or athletic trainer. While completing these exercises, remember:   Muscles can gain both the endurance and the strength needed for everyday activities through controlled exercises.  Complete these exercises as instructed by your physician, physical therapist, or athletic trainer. Progress the resistance and repetitions only as guided. STRENGTH - Dorsiflexors  Secure a rubber exercise band/tubing to a fixed object (i.e., table, pole) and loop the other end around your right / left foot.  Sit on the floor facing the fixed object. The band/tubing should be slightly tense when your foot is relaxed.  Slowly draw your foot back toward you  using your ankle and toes.  Hold this position for __________ seconds. Slowly release the tension in the band and return your foot to the starting position. Repeat __________ times. Complete this exercise __________ times per day.  STRENGTH - Towel Curls  Sit in a chair positioned on a non-carpeted surface.  Place your foot on a towel, keeping your heel on the floor.  Pull the towel toward your heel by only curling your toes. Keep your heel on the floor.  If instructed by your physician, physical therapist, or athletic trainer, add ____________________ at the end of the towel. Repeat __________ times. Complete this exercise __________ times per day. STRENGTH - Ankle Eversion   Secure one end of a rubber exercise band/tubing to a fixed object (table, pole). Loop the other end around your foot just before your toes.  Place your fists between your knees. This will focus your strengthening at your ankle.  Drawing the band/tubing across your opposite foot, slowly pull your little toe out and up. Make sure the band/tubing is positioned to resist the entire motion.  Hold this position for __________ seconds.  Have   your muscles resist the band/tubing as it slowly pulls your foot back to the starting position. Repeat __________ times. Complete this exercise __________ times per day.  STRENGTH - Ankle Inversion   Secure one end of a rubber exercise band/tubing to a fixed object (table, pole). Loop the other end around your foot just before your toes.  Place your fists between your knees. This will focus your strengthening at your ankle.  Slowly, pull your big toe up and in, making sure the band/tubing is positioned to resist the entire motion.  Hold this position for __________ seconds.  Have your muscles resist the band/tubing as it slowly pulls your foot back to the starting position. Repeat __________ times. Complete this exercises __________ times per day.    This information is not  intended to replace advice given to you by your health care provider. Make sure you discuss any questions you have with your health care provider.   Document Released: 04/10/2005 Document Revised: 08/25/2014 Document Reviewed: 07/23/2008 Elsevier Interactive Patient Education 2016 [employer]  

## 2015-04-23 ENCOUNTER — Ambulatory Visit: Payer: BLUE CROSS/BLUE SHIELD | Admitting: Internal Medicine

## 2015-04-26 NOTE — Progress Notes (Signed)
Subjective:     Patient ID: Theresa Buck, female   DOB: 1955/12/31, 60 y.o.   MRN: [id number]  HPI 60 year old female presents the also consented to left ankle pain which is been on going intermittently over the last 6 months to 1 year. She states that she has pain on the inside portion of her ankle. She states that they are does become more problematic after she is on it more. She denies any recent injury or trauma. There's been no swelling or redness. No tingling or numbness. The pain does not wake her up at night. She has tried shoe gear modifications without much relief. No other complaints at this time.  Review of Systems  All other systems reviewed and are negative.      Objective:   Physical Exam General: AAO x3, NAD  Dermatological: Skin is warm, dry and supple bilateral. Nails x 10 are well manicured; remaining integument appears unremarkable at this time. There are no open sores, no preulcerative lesions, no rash or signs of infection present.  Vascular: Dorsalis Pedis artery and Posterior Tibial artery pedal pulses are 2/4 bilateral with immedate capillary fill time. Pedal hair growth present. No varicosities and no lower extremity edema present bilateral. There is no pain with calf compression, swelling, warmth, erythema.   Neruologic: Grossly intact via light touch bilateral. Vibratory intact via tuning fork bilateral. Protective threshold with Semmes Wienstein monofilament intact to all pedal sites bilateral. Patellar and Achilles deep tendon reflexes 2+ bilateral. No Babinski or clonus noted bilateral.   Musculoskeletal: Upon palpation along the course of the posterior tibial tendon subjective this is where she states that she has pain all issues, not experience any pain today. There is no other area tenderness to bilateral lower extremity. There is no area pinpoint bony tenderness or pain the vibratory sensation. Ankle, subtalar, midtarsal, MPJ range of motion is intact  with any restrictions. MMT 5/5. There is a mild decrease in medial arch height. Mild equinus present. She is able to perform a single and double heel rise bilaterally.  Gait: Unassisted, Nonantalgic.      Assessment:     60 year old female with left ankle pain likely result of posterior tibial tendinitis    Plan:     -X-rays were obtained and reviewed with the patient.  -Treatment options discussed including all alternatives, risks, and complications -Etiology of symptoms were discussed -Discussed range of motion, rehabilitation exercises for the posterior tibial tendon.  -Anti-inflammatories as needed. -Ankle brace to wear as needed.  -Supportive shoe gear. Also discussed custom orthotics. She'll start with over-the-counter orthotic. I discussed with her what to look for and purchasing these. -Follow-up if symptoms continue or worsen or sooner if any problems arise. In the meantime, encouraged to call the office with any questions, concerns, change in symptoms.   Celesta Gentile, DPM

## 2015-05-06 ENCOUNTER — Encounter: Payer: Self-pay | Admitting: Internal Medicine

## 2015-05-06 ENCOUNTER — Ambulatory Visit (INDEPENDENT_AMBULATORY_CARE_PROVIDER_SITE_OTHER): Payer: BLUE CROSS/BLUE SHIELD | Admitting: Internal Medicine

## 2015-05-06 VITALS — BP 116/68 | HR 77 | Temp 97.9°F | Resp 12 | Ht 67.0 in | Wt 157.0 lb

## 2015-05-06 DIAGNOSIS — Z91048 Other nonmedicinal substance allergy status: Secondary | ICD-10-CM

## 2015-05-06 DIAGNOSIS — I471 Supraventricular tachycardia: Secondary | ICD-10-CM | POA: Diagnosis not present

## 2015-05-06 DIAGNOSIS — F439 Reaction to severe stress, unspecified: Secondary | ICD-10-CM

## 2015-05-06 DIAGNOSIS — K219 Gastro-esophageal reflux disease without esophagitis: Secondary | ICD-10-CM

## 2015-05-06 DIAGNOSIS — Z9109 Other allergy status, other than to drugs and biological substances: Secondary | ICD-10-CM

## 2015-05-06 DIAGNOSIS — Z13220 Encounter for screening for lipoid disorders: Secondary | ICD-10-CM

## 2015-05-06 DIAGNOSIS — Z658 Other specified problems related to psychosocial circumstances: Secondary | ICD-10-CM | POA: Diagnosis not present

## 2015-05-06 MED ORDER — PROPRANOLOL HCL 20 MG PO TABS: 20.0000 mg | ORAL_TABLET | Freq: Three times a day (TID) | ORAL | Status: DC | PRN

## 2015-05-06 MED ORDER — HYDROCORTISONE ACETATE 25 MG RE SUPP: RECTAL | Status: DC

## 2015-05-06 NOTE — Progress Notes (Signed)
Pre visit review using our clinic review tool, if applicable. No additional management support is needed unless otherwise documented below in the visit note. 

## 2015-05-06 NOTE — Progress Notes (Signed)
Patient ID: Theresa Buck, female   DOB: 05/14/1955, 60 y.o.   MRN: [id number]   Subjective:    Patient ID: Theresa Buck, female    DOB: 1956/03/31, 60 y.o.   MRN: [id number]  HPI  Patient with past history of deviated septum and allergies.  She comes in today for a scheduled follow up.  She reports increased congestion and drainage.  Apparently took a recent zpak.  Is better, just persistent nasal congestion and drainage.  No chest pain or tightness.  No sob.  No abdominal pain or cramping.  Some intermittent problems with hemorrhoids.  Occasionally will bleed - spot or streak on tissue.  Bowels stable overall.  Increased stress with some family issues.  Her youngest son is in the [employer].  Planning to go back out of the country soon.  Also, her middle son - has heart disease and vascular disease. She does not feel she needs any further intervention at this time. Does report occasional increased heart rate.  Does not occur often.  Takes occasional propranolol.  Works well.  Request refill.      Past Medical History  Diagnosis Date  . H/O sinusitis   . Deviated septum   . Hx of degenerative disc disease   . Deviated septum   . Allergy     recurring sinus problems   No past surgical history on file. Family History  Problem Relation Age of Onset  . ALS Father   . Diabetes Brother     multiple  . Brain cancer Mother   . Heart disease Son     myocardial infarction, s/p CABG   Social History   Social History  . Marital Status: Widowed    Spouse Name: N/A  . Number of Children: 3  . Years of Education: N/A   Social History Main Topics  . Smoking status: Never Smoker   . Smokeless tobacco: Never Used  . Alcohol Use: No  . Drug Use: No  . Sexual Activity: Not Asked   Other Topics Concern  . None   Social History Narrative    Outpatient Encounter Prescriptions as of 05/06/2015  Medication Sig  . Ascorbic Acid (VITAMIN C) 100 MG tablet Take 100 mg by mouth  daily.  Marland Kitchen azelastine (ASTELIN) 137 MCG/SPRAY nasal spray Place 1 spray into the nose 2 (two) times daily. Use in each nostril as directed  . BIOTIN PO Take by mouth 2 (two) times daily.  Marland Kitchen diltiazem (CARDIZEM) 30 MG tablet Take 1 tablet (30 mg total) by mouth 4 (four) times daily as needed.  Marland Kitchen estradiol (ESTRACE) 1 MG tablet Take 1 mg by mouth daily.  . fluticasone (FLONASE) 50 MCG/ACT nasal spray Place 2 sprays into both nostrils daily.  . hydrocortisone (ANUCORT-HC) 25 MG suppository unwrap and insert 1 suppository rectally twice a day  . mometasone (NASONEX) 50 MCG/ACT nasal spray Place 2 sprays into the nose daily.  . Multiple Vitamin (MULTIVITAMIN) tablet Take 1 tablet by mouth daily.  . pantoprazole (PROTONIX) 40 MG tablet take 1 tablet by mouth once daily  . progesterone 200 MG SUPP Place 200 mg vaginally at bedtime.  . propranolol (INDERAL) 20 MG tablet Take 1 tablet (20 mg total) by mouth 3 (three) times daily as needed.  . traZODone (DESYREL) 50 MG tablet Take 0.5-1 tablets (25-50 mg total) by mouth at bedtime as needed for sleep.  . [DISCONTINUED] ANUCORT-HC 25 MG suppository unwrap and insert 1 suppository rectally twice a day  . [  DISCONTINUED] propranolol (INDERAL) 20 MG tablet Take 1 tablet (20 mg total) by mouth 3 (three) times daily as needed.   No facility-administered encounter medications on file as of 05/06/2015.    Review of Systems  Constitutional: Negative for appetite change and unexpected weight change.  HENT: Positive for congestion and postnasal drip.   Eyes: Negative for discharge and redness.  Respiratory: Negative for cough, chest tightness and shortness of breath.   Cardiovascular: Negative for chest pain, palpitations and leg swelling.  Gastrointestinal: Negative for nausea, vomiting, abdominal pain and diarrhea.       Some hemorrhoid issues as outlined.    Genitourinary: Negative for dysuria and difficulty urinating.  Musculoskeletal: Negative for back pain  and joint swelling.  Skin: Negative for color change and rash.  Neurological: Negative for dizziness, light-headedness and headaches.  Psychiatric/Behavioral: Negative for dysphoric mood and agitation.       Increased stress as outlined.         Objective:    Physical Exam  Constitutional: She appears well-developed and well-nourished. No distress.  HENT:  Nose: Nose normal.  Mouth/Throat: Oropharynx is clear and moist.  Eyes: Conjunctivae are normal. Right eye exhibits no discharge. Left eye exhibits no discharge.  Neck: Neck supple. No thyromegaly present.  Cardiovascular: Normal rate and regular rhythm.   Pulmonary/Chest: Effort normal and breath sounds normal. No respiratory distress.  Abdominal: Soft. Bowel sounds are normal. There is no tenderness.  Musculoskeletal: She exhibits no edema or tenderness.  Lymphadenopathy:    She has no cervical adenopathy.  Skin: No rash noted. No erythema.  Psychiatric: She has a normal mood and affect. Her behavior is normal.    BP 116/68 mmHg  Pulse 77  Temp(Src) 97.9 F (36.6 C) (Oral)  Resp 12  Ht 5\' 7"  (1.702 m)  Wt 157 lb (71.215 kg)  BMI 24.58 kg/m2  SpO2 99% Wt Readings from Last 3 Encounters:  05/06/15 157 lb (71.215 kg)  02/12/15 156 lb (70.761 kg)  08/07/14 156 lb 8 oz (70.988 kg)     Lab Results  Component Value Date   WBC 7.9 08/07/2013   HGB 14.6 08/07/2013   HCT 43.0 08/07/2013   PLT 242.0 08/07/2013   GLUCOSE 89 03/17/2014   CHOL 188 03/17/2014   TRIG 107.0 03/17/2014   HDL 44.50 03/17/2014   LDLCALC 122* 03/17/2014   ALT 13 03/17/2014   AST 17 03/17/2014   NA 142 03/17/2014   K 4.2 03/17/2014   CL 104 03/17/2014   CREATININE 0.8 03/17/2014   BUN 12 03/17/2014   CO2 26 03/17/2014   TSH 1.77 03/17/2014    Mm Digital Screening Bilateral  11/13/2014  CLINICAL DATA:  Screening. EXAM: DIGITAL SCREENING BILATERAL MAMMOGRAM WITH CAD COMPARISON:  Previous exam(s). ACR Breast Density Category c: The breast  tissue is heterogeneously dense, which may obscure small masses. FINDINGS: There are no findings suspicious for malignancy. Images were processed with CAD. IMPRESSION: No mammographic evidence of malignancy. A result letter of this screening mammogram will be mailed directly to the patient. RECOMMENDATION: Screening mammogram in one year. (Code:SM-B-01Y) BI-RADS CATEGORY  1: Negative. Electronically Signed   By: Claudie Revering M.D.   On: 11/13/2014 15:04       Assessment & Plan:   Problem List Items Addressed This Visit    Environmental allergies    Some congestion recently.  Is better.  Saline nasal spray and steroid nasal spray as directed.  Follow.  GERD (gastroesophageal reflux disease)    On protonix.  No acid reflux.  Follow.        Paroxysmal supraventricular tachycardia ([place])    Has previously seen Dr Rockey Situ.  Has propranolol to take prn.  Overall stable.        Relevant Medications   propranolol (INDERAL) 20 MG tablet   Other Relevant Orders   Comprehensive metabolic panel   TSH   Stress - Primary    Increased stress as outlined.  Discussed with her today.  She feels handling things relatively well and does not feel needs any further intervention at this time.  Follow.        Relevant Orders   CBC with Differential/Platelet    Other Visit Diagnoses    Screening cholesterol level        Relevant Orders    Lipid panel        Einar Pheasant, MD

## 2015-05-07 ENCOUNTER — Telehealth: Payer: Self-pay

## 2015-05-07 NOTE — Telephone Encounter (Signed)
Pt.'s insurance will not cover ANUCORT-HC 25MG  SUPPOSITORY or the generic. Please advise./tvw

## 2015-05-07 NOTE — Telephone Encounter (Signed)
Please call pharmacy and see if they have an alternative that insurance will cover.

## 2015-05-09 ENCOUNTER — Encounter: Payer: Self-pay | Admitting: Internal Medicine

## 2015-05-09 NOTE — Assessment & Plan Note (Signed)
Has previously seen Dr Rockey Situ.  Has propranolol to take prn.  Overall stable.

## 2015-05-09 NOTE — Assessment & Plan Note (Signed)
Some congestion recently.  Is better.  Saline nasal spray and steroid nasal spray as directed.  Follow.

## 2015-05-09 NOTE — Assessment & Plan Note (Signed)
On protonix.  No acid reflux.  Follow.   

## 2015-05-09 NOTE — Assessment & Plan Note (Signed)
Increased stress as outlined.  Discussed with her today.  She feels handling things relatively well and does not feel needs any further intervention at this time.  Follow.

## 2015-05-10 ENCOUNTER — Other Ambulatory Visit: Payer: Self-pay | Admitting: Internal Medicine

## 2015-05-10 NOTE — Telephone Encounter (Signed)
LMOM FOR PT TO CALL OFFICE BACK/TVW

## 2015-05-10 NOTE — Telephone Encounter (Signed)
Called the pharmacy and they said there is not and alternative that the insurance will cover. Please advise./tvw

## 2015-05-10 NOTE — Telephone Encounter (Signed)
Please call pt and let her know about insurance.  She may call her insurance and see if they can give her information about coverage.

## 2015-05-14 ENCOUNTER — Telehealth: Payer: Self-pay

## 2015-05-14 ENCOUNTER — Other Ambulatory Visit: Payer: Self-pay | Admitting: Internal Medicine

## 2015-05-14 NOTE — Telephone Encounter (Signed)
Pt will be calling insurance about what they will cover as an alternative to the [facility] 25mg  suppository.

## 2015-05-17 ENCOUNTER — Telehealth: Payer: Self-pay

## 2015-05-17 NOTE — Telephone Encounter (Signed)
PA for Anucort suppositoory, completed on Cover My meds

## 2015-05-23 ENCOUNTER — Encounter: Payer: Self-pay | Admitting: Internal Medicine

## 2015-05-24 MED ORDER — AZITHROMYCIN 250 MG PO TABS: ORAL_TABLET | ORAL | Status: DC

## 2015-05-24 NOTE — Telephone Encounter (Signed)
Pt sent my chart message to clarify previous abx.

## 2015-05-24 NOTE — Addendum Note (Signed)
Addended by: Alisa Graff on: 05/24/2015 01:10 PM   Modules accepted: Orders

## 2015-05-24 NOTE — Telephone Encounter (Signed)
PA was denied. Per [facility] documentation on 05/14/15, pt will be contacting insurance to see if they cover an alternative

## 2015-05-24 NOTE — Telephone Encounter (Signed)
rx sent in for zpak.  Pt notified via my chart.

## 2015-05-25 ENCOUNTER — Other Ambulatory Visit (INDEPENDENT_AMBULATORY_CARE_PROVIDER_SITE_OTHER): Payer: BLUE CROSS/BLUE SHIELD

## 2015-05-25 DIAGNOSIS — Z13220 Encounter for screening for lipoid disorders: Secondary | ICD-10-CM

## 2015-05-25 DIAGNOSIS — F439 Reaction to severe stress, unspecified: Secondary | ICD-10-CM

## 2015-05-25 DIAGNOSIS — Z658 Other specified problems related to psychosocial circumstances: Secondary | ICD-10-CM | POA: Diagnosis not present

## 2015-05-25 DIAGNOSIS — I471 Supraventricular tachycardia: Secondary | ICD-10-CM

## 2015-05-25 LAB — COMPREHENSIVE METABOLIC PANEL
ALT: 13 U/L (ref 0–35)
AST: 16 U/L (ref 0–37)
Albumin: 4.1 g/dL (ref 3.5–5.2)
Alkaline Phosphatase: 76 U/L (ref 39–117)
BUN: 12 mg/dL (ref 6–23)
CO2: 28 mEq/L (ref 19–32)
Calcium: 9.4 mg/dL (ref 8.4–10.5)
Chloride: 103 mEq/L (ref 96–112)
Creatinine, Ser: 0.72 mg/dL (ref 0.40–1.20)
GFR: 88.33 mL/min (ref >60.00)
Glucose, Bld: 96 mg/dL (ref 70–99)
Potassium: 4.1 mEq/L (ref 3.5–5.1)
Sodium: 139 mEq/L (ref 135–145)
Total Bilirubin: 0.5 mg/dL (ref 0.2–1.2)
Total Protein: 7 g/dL (ref 6.0–8.3)

## 2015-05-25 LAB — CBC WITH DIFFERENTIAL/PLATELET
Basophils Absolute: 0 10*3/uL (ref 0.0–0.1)
Basophils Relative: 0.6 % (ref 0.0–3.0)
Eosinophils Absolute: 0.2 10*3/uL (ref 0.0–0.7)
Eosinophils Relative: 2.9 % (ref 0.0–5.0)
HCT: 41.8 % (ref 36.0–46.0)
Hemoglobin: 14.1 g/dL (ref 12.0–15.0)
Lymphocytes Relative: 22.7 % (ref 12.0–46.0)
Lymphs Abs: 1.7 10*3/uL (ref 0.7–4.0)
MCHC: 33.8 g/dL (ref 30.0–36.0)
MCV: 93.3 fl (ref 78.0–100.0)
Monocytes Absolute: 0.9 10*3/uL (ref 0.1–1.0)
Monocytes Relative: 12.5 % — ABNORMAL HIGH (ref 3.0–12.0)
Neutro Abs: 4.5 10*3/uL (ref 1.4–7.7)
Neutrophils Relative %: 61.3 % (ref 43.0–77.0)
Platelets: 207 10*3/uL (ref 150.0–400.0)
RBC: 4.48 Mil/uL (ref 3.87–5.11)
RDW: 12.6 % (ref 11.5–15.5)
WBC: 7.4 10*3/uL (ref 4.0–10.5)

## 2015-05-25 LAB — TSH: TSH: 1.53 u[IU]/mL (ref 0.35–4.50)

## 2015-05-25 LAB — LIPID PANEL
Cholesterol: 186 mg/dL (ref 0–200)
HDL: 42.9 mg/dL (ref >39.00)
LDL Cholesterol: 119 mg/dL — ABNORMAL HIGH (ref 0–99)
NonHDL: 142.89
Total CHOL/HDL Ratio: 4
Triglycerides: 117 mg/dL (ref 0.0–149.0)
VLDL: 23.4 mg/dL (ref 0.0–40.0)

## 2015-05-26 ENCOUNTER — Encounter: Payer: Self-pay | Admitting: Internal Medicine

## 2015-05-31 ENCOUNTER — Telehealth: Payer: Self-pay | Admitting: Internal Medicine

## 2015-05-31 DIAGNOSIS — R0981 Nasal congestion: Secondary | ICD-10-CM

## 2015-05-31 NOTE — Telephone Encounter (Signed)
Spoke with the patient, she has an appointment scheduled at the ENT, just needed the referral on file.  Thanks

## 2015-05-31 NOTE — Telephone Encounter (Signed)
Pt called wanting to get a referral to the ENT Dr. Pryor Ochoa. Pt states she is having pain in her sinus and post nasal drip. Pt also states she went to the provider before but it has been a year. Call pt @ [phone]. Thank You!

## 2015-05-31 NOTE — Telephone Encounter (Signed)
I have placed the order for the referral.  Someone from the office will be contacting her with an appt date and time.   

## 2015-05-31 NOTE — Telephone Encounter (Signed)
Pt needs a new referral for ENT the last one expired. Please advise

## 2015-06-03 ENCOUNTER — Encounter: Payer: Self-pay | Admitting: Podiatry

## 2015-06-03 ENCOUNTER — Ambulatory Visit: Payer: BLUE CROSS/BLUE SHIELD | Admitting: Podiatry

## 2015-06-03 ENCOUNTER — Ambulatory Visit (INDEPENDENT_AMBULATORY_CARE_PROVIDER_SITE_OTHER): Payer: BLUE CROSS/BLUE SHIELD | Admitting: Podiatry

## 2015-06-03 VITALS — BP 116/68 | HR 72 | Resp 18

## 2015-06-03 DIAGNOSIS — M779 Enthesopathy, unspecified: Secondary | ICD-10-CM | POA: Diagnosis not present

## 2015-06-03 NOTE — Progress Notes (Signed)
Patient ID: Theresa Buck, female   DOB: Nov 16, 1955, 60 y.o.   MRN: [id number]  Subjective: 60 year old no presents the office for follow-up evaluation left ankle pain, tendinitis but she states that overall she is doing much better. She occasionally still gets some pain however is when she goes barefoot. She has been doing the stretching and icing when she first saw me however she has not been doing recently. She did purchase over-the-counter inserts which seem to be helping. Denies any systemic complaints such as fevers, chills, nausea, vomiting. No acute changes since last appointment, and no other complaints at this time.   Objective: AAO x3, NAD DP/PT pulses palpable bilaterally, CRT less than 3 seconds Protective sensation intact with Simms Weinstein monofilament At this time there is no tenderness to palpation on the course of the posterior tibial tendon on the left side and there is no area pinpoint bony tenderness or pain the vibratory sensation or other areas of tenderness to bilateral lower extremities No areas of pinpoint bony tenderness or pain with vibratory sensation. MMT 5/5, ROM WNL. No edema, erythema, increase in warmth to bilateral lower extremities.  No open lesions or pre-ulcerative lesions.  No pain with calf compression, swelling, warmth, erythema  Assessment:  resolving tendinitis left side  Plan: -All treatment options discussed with the patient including all alternatives, risks, complications.  -Recommended continue with stretching, icing exercises to help prevent recurrence. Continue with orthotics. I discussed with her custom orthotics ever since the over-the-counter doing well we will continue with this for now. Continue to monitor for any recurrence. Follow-up as needed. -Patient encouraged to call the office with any questions, concerns, change in symptoms.   Celesta Gentile, DPM

## 2015-06-03 NOTE — Patient Instructions (Signed)
Posterior Tibial Tendon Rupture With Rehab Tendons are soft tissues that connect muscle to bone. Tendons allow muscles to move the skeletal system. A complete tear of the posterior tibial tendon is known as a posterior tendon rupture. The posterior tibial tendon attaches the posterior muscles on the inner portion of the back of the lower leg (tibialis muscles) to foot. The posterior tibialis muscles help straighten (plantar flex) and rotate the foot inward (medially rotate) the foot. A posterior tibial rupture will result in a decreased ability to perform these tasks. SYMPTOMS   A "pop" or tear felt and/or heard in the area at the time of injury.  Pain, tenderness, inflammation, and/or bruising (contusion) around the medial inner side of the ankle.  Pain that worsens with dorsiflexion (opposite of plantar flexion) of the foot.  Decreased ankle strength.  Decrease in the prominence of the arch in the sole of the foot. CAUSES  Tendon ruptures occur when a force is placed on the tendon that is greater than it can withstand. Common mechanisms of injury include:  An event that places great stress on the tendon (jumping or starting a sprint).  Direct trauma to the ankle. RISK INCREASES WITH:  Sports that involve forceful and explosive plantar flexion (jumping or quick starts).  Poor strength and flexibility.  Previous injury to the posterior tibial tendon.  Corticosteroid injection into the posterior tibial tendon.  Obesity.  Poor vascular circulation. PREVENTION   Warm up and stretch properly before activity.  Allow for adequate recovery between workouts.  Maintain physical fitness:  Strength, flexibility, and endurance.  Cardiovascular fitness.  Maintain a healthy body weight.  Arch supports (orthotics), if you have flat feet.  Limit ankle movement by taping, wearing a brace, or using compression bandages. PROGNOSIS  In order to have the highest likelihood of returning  to your pre-injury activity level, surgery is usually recommended, with 4 to 9 months of rehabilitation afterward.  RELATED COMPLICATIONS   Decreased ability to plantar flex.  Rerupture of the posterior tibial tendon.  Prolonged disability.  Flat feet.  Arthritis of the foot.  Risks of surgery: infection, bleeding, nerve damage, or damage to surrounding tissues. TREATMENT  Treatment initially involves resting from any activities that aggravate your symptoms. Ice, medication, and elevation can be used to help reduce pain and inflammation. In order to have the best results, surgery is recommended. Surgery involves sewing (suturing) the ends of the torn tendon back together. If your surgeon cannot repair the tendon, then a replacement (reconstruction) surgery may be performed to use another tendon to replace the function of the ruptured tendon. After surgery, the ankle is immobilized in order to allow the tendon to heal. After immobilization, it is important to perform strengthening and stretching exercises to help regain strength and a full range of motion. These exercises may be completed at home or with a therapist. MEDICATION   Nonsteroidal anti-inflammatory medications, such as aspirin and ibuprofen (do not take within 7 days before surgery), or other minor pain relievers, such as acetaminophen, are often recommended. Take these as directed by your caregiver. Contact your caregiver immediately if any bleeding, stomach upset, or signs of an allergic reaction occur.  Pain relievers may be prescribed as necessary by your caregiver. Use only as directed and only as much as you need. SEEK MEDICAL CARE IF:  Treatment seems to offer no benefit, or the condition worsens.  Any medications produce adverse side effects.  Any complications from surgery occur:  Pain, numbness, or  coldness in the extremity operated upon.  Discoloration of the nail beds (they become blue or gray) of the extremity  operated upon.  Signs of infections (fever, pain, inflammation, redness, or persistent bleeding). EXERCISES RANGE OF MOTION (ROM) AND STRETCHING EXERCISES - Posterior Tibial Tendon Rupture These exercises may help you when beginning to rehabilitate your injury. Your symptoms may resolve with or without further involvement from your physician, physical therapist, or athletic trainer. While completing these exercises, remember:   Restoring tissue flexibility helps normal motion to return to the joints. This allows healthier, less painful movement and activity.  An effective stretch should be held for at least 30 seconds.  A stretch should never be painful. You should only feel a gentle lengthening or release in the stretched tissue. STRETCH - Gastrocsoleus   Sit with your right / left leg extended. Holding onto both ends of a belt or towel, loop it around the ball of your foot.  Keeping your right / left ankle and foot relaxed and your knee straight, pull your foot and ankle toward you using the belt/towel.  You should feel a gentle stretch behind your calf or knee. Hold this position for __________ seconds. Repeat __________ times. Complete this stretch __________. times per day.  RANGE OF MOTION - Dorsi/Plantar Flexion  While sitting with your right / left knee straight, draw the top of your foot upwards by flexing your ankle. Then reverse the motion, pointing your toes downward.  Hold each position for __________ seconds.  After completing your first set of exercises, repeat this exercise with your knee bent. Repeat __________ times. Complete this exercise __________ times per day.  RANGE OF MOTION - Ankle Plantar Flexion   Sit with your right / left leg crossed over your opposite knee.  Use your opposite hand to pull the top of your foot and toes toward you.  You should feel a gentle stretch on the top of your foot/ankle. Hold this position for __________ seconds. Repeat  __________ times. Complete __________ times per day.  RANGE OF MOTION - Ankle Eversion   Sit with your right / left ankle crossed over your opposite knee.  Grip your foot with your opposite hand, placing your thumb on the top of your foot and your fingers across the bottom of your foot.  Gently push your foot downward with a slight rotation so your littlest toes rise slightly.  You should feel a gentle stretch on the inside of your ankle. Hold the stretch for __________ seconds. Repeat __________ times. Complete this exercise __________ times per day.  RANGE OF MOTION - Ankle Inversion   Sit with your right / left ankle crossed over your opposite knee.  Grip your foot with your opposite hand, placing your thumb on the bottom of your foot and your fingers across the top of your foot.  Gently pull your foot so the smallest toe comes toward you and your thumb pushes the inside of the ball of your foot away from you.  You should feel a gentle stretch on the outside of your ankle. Hold the stretch for __________ seconds. Repeat __________ times. Complete this exercise __________ times per day.  RANGE OF MOTION - Ankle Alphabet  Imagine your right / left big toe is a pen.  Keeping your hip and knee still, write out the entire alphabet with your "pen." Make the letters as large as you can without increasing any discomfort. Repeat __________ times. Complete this exercise __________ times per day.  STRENGTHENING EXERCISES - Posterior Tibial Tendon Rupture These exercises may help you when beginning to rehabilitate your injury. They may resolve your symptoms with or without further involvement from your physician, physical therapist, or athletic trainer. While completing these exercises, remember:   Muscles can gain both the endurance and the strength needed for everyday activities through controlled exercises.  Complete these exercises as instructed by your physician, physical therapist, or  athletic trainer. Progress the resistance and repetitions only as guided. STRENGTH - Dorsiflexors  Secure a rubber exercise band/tubing to a fixed object (table, pole) and loop the other end around your right / left foot.  Sit on the floor facing the fixed object. The band/tubing should be slightly tense when your foot is relaxed.  Slowly draw your foot back toward you using your ankle and toes.  Hold this position for __________ seconds. Slowly release the tension in the band and return your foot to the starting position. Repeat __________ times. Complete this exercise __________ times per day.  STRENGTH - Plantar Flexors   Sit with your right / left leg extended. Holding onto both ends of a rubber exercise band/tubing, loop it around the ball of your foot. Keep a slight tension in the band.  Slowly push your toes away from you, pointing them downward.  Hold this position for __________ seconds. Return slowly, controlling the tension in the band/tubing. Repeat __________ times. Complete this exercise __________ times per day.  STRENGTH - Towel Curls  Sit in a chair positioned on a non-carpeted surface.  Place your foot on a towel, keeping your heel on the floor.  Pull the towel toward your heel by only curling your toes. Keep your heel on the floor.  If instructed by your physician, physical therapist or athletic trainer, add ____________________ at the end of the towel. Repeat __________ times. Complete this exercise __________ times per day. STRENGTH - Ankle Inversion   Secure one end of a rubber exercise band/tubing to a fixed object (table, pole). Loop the other end around your foot just before your toes.  Place your fists between your knees. This will focus your strengthening at your ankle.  Slowly, pull your big toe up and in, making sure the band/tubing is positioned to resist the entire motion.  Hold this position for __________ seconds.  Have your muscles resist the  band/tubing as it slowly pulls your foot back to the starting position. Repeat __________ times. Complete this exercise __________ times per day.    This information is not intended to replace advice given to you by your health care provider. Make sure you discuss any questions you have with your health care provider.   Document Released: 04/10/2005 Document Revised: 12/30/2014 Document Reviewed: 07/23/2008 Elsevier Interactive Patient Education [employer].

## 2015-06-14 ENCOUNTER — Encounter: Payer: Self-pay | Admitting: Internal Medicine

## 2015-06-16 ENCOUNTER — Other Ambulatory Visit: Payer: Self-pay | Admitting: *Deleted

## 2015-06-16 MED ORDER — HYDROCORTISONE ACETATE 25 MG RE SUPP: 25.0000 mg | Freq: Two times a day (BID) | RECTAL | Status: DC | PRN

## 2015-06-16 NOTE — Telephone Encounter (Signed)
Pt has tried otc medication and still with hemorrhoid issue.  annusol hc suppositories in past have helped to relieve her symptoms.  Can we try to get authorization stating has used otc and not worked.  Needs rx and they [place] have [place] ([place] pt) - needs annusol hc.

## 2015-06-22 NOTE — Telephone Encounter (Signed)
Please call pt and see if having symptoms now.  Given reoccurring issue, I do think she needs to be evaluated.  If acute symptoms or issues - acute care or [facility] [facility] today.   Then, can f/u with [place] after

## 2015-06-24 ENCOUNTER — Encounter: Payer: Self-pay | Admitting: Internal Medicine

## 2015-06-24 NOTE — Telephone Encounter (Signed)
Please call pt and see if she can come in at 4:00 on 06/29/15.  Thanks

## 2015-06-29 ENCOUNTER — Encounter: Payer: Self-pay | Admitting: Internal Medicine

## 2015-06-29 ENCOUNTER — Ambulatory Visit (INDEPENDENT_AMBULATORY_CARE_PROVIDER_SITE_OTHER): Payer: BLUE CROSS/BLUE SHIELD | Admitting: Internal Medicine

## 2015-06-29 VITALS — BP 100/70 | HR 61 | Temp 98.1°F | Resp 18 | Ht 67.0 in | Wt 153.1 lb

## 2015-06-29 DIAGNOSIS — K219 Gastro-esophageal reflux disease without esophagitis: Secondary | ICD-10-CM

## 2015-06-29 DIAGNOSIS — R1013 Epigastric pain: Secondary | ICD-10-CM | POA: Diagnosis not present

## 2015-06-29 DIAGNOSIS — Z658 Other specified problems related to psychosocial circumstances: Secondary | ICD-10-CM

## 2015-06-29 DIAGNOSIS — F439 Reaction to severe stress, unspecified: Secondary | ICD-10-CM

## 2015-06-29 NOTE — Progress Notes (Signed)
Pre-visit discussion using our clinic review tool. No additional management support is needed unless otherwise documented below in the visit note.  

## 2015-06-29 NOTE — Patient Instructions (Signed)
Electronics engineer (or Publishing copy or [employer]) - daily

## 2015-07-02 ENCOUNTER — Telehealth: Payer: Self-pay | Admitting: Internal Medicine

## 2015-07-02 NOTE — Telephone Encounter (Signed)
Pt came in on 06/29/2015 pt states her and Dr Nicki Reaper spoke about antibiotics so it was not given to pt. Pt now states she needs a prescription due to coughing a lot, drainage feels like it's stuck down in her throat, head hurts,pressure in face. Pharmacy is [facility] [place], [place] - [address]. Call pt @ [phone]. Thank you!

## 2015-07-02 NOTE — Telephone Encounter (Signed)
LMOM

## 2015-07-04 ENCOUNTER — Encounter: Payer: Self-pay | Admitting: Internal Medicine

## 2015-07-04 NOTE — Assessment & Plan Note (Addendum)
Persistent intermittent flare.  Symptoms as outlined.  Pain associated with vomiting and diarrhea.  Have discussed abdominal ultrasound/CT.  Continue protonix.  Refer to GI for evaluation and question of need for EGD. Hold on scanning.  Start a probiotic.

## 2015-07-04 NOTE — Assessment & Plan Note (Signed)
On pantoprazole.  Intermittent flares as outlined.  Continue protonix.  Refer to GI for evaluation and question of need for EGD.

## 2015-07-04 NOTE — Progress Notes (Signed)
Patient ID: Gizell Sankovich, female   DOB: October 02, 1955, 60 y.o.   MRN: [id number]   Subjective:    Patient ID: Morrissa Prisock, female    DOB: 1955-10-08, 60 y.o.   MRN: [id number]  HPI  Patient with past history of allergies who comes in today with concerns regarding persistent intermittent abdominal discomfort and emesis.  Last episode occurred a few days ago.  Had emesis, diarrhea and chills.  Subnormal temp.  This has occurred on a few other occasions.  Lasted approximately 17 hours.  Felt sluggish.  Now has some hemorrhoidal pain.  Feels as if acid and food coming up.    Past Medical History  Diagnosis Date  . H/O sinusitis   . Deviated septum   . Hx of degenerative disc disease   . Deviated septum   . Allergy     recurring sinus problems   No past surgical history on file. Family History  Problem Relation Age of Onset  . ALS Father   . Diabetes Brother     multiple  . Brain cancer Mother   . Heart disease Son     myocardial infarction, s/p CABG   Social History   Social History  . Marital Status: Widowed    Spouse Name: N/A  . Number of Children: 3  . Years of Education: N/A   Social History Main Topics  . Smoking status: Never Smoker   . Smokeless tobacco: Never Used  . Alcohol Use: No  . Drug Use: No  . Sexual Activity: Not Asked   Other Topics Concern  . None   Social History Narrative    Outpatient Encounter Prescriptions as of 06/29/2015  Medication Sig  . Ascorbic Acid (VITAMIN C) 100 MG tablet Take 100 mg by mouth daily.  Marland Kitchen azelastine (ASTELIN) 137 MCG/SPRAY nasal spray Place 1 spray into the nose 2 (two) times daily. Use in each nostril as directed  . BIOTIN PO Take by mouth 2 (two) times daily.  Marland Kitchen diltiazem (CARDIZEM) 30 MG tablet Take 1 tablet (30 mg total) by mouth 4 (four) times daily as needed.  Marland Kitchen estradiol (ESTRACE) 1 MG tablet Take 1 mg by mouth daily.  . fluticasone (FLONASE) 50 MCG/ACT nasal spray Place 2 sprays into both nostrils  daily.  . mometasone (NASONEX) 50 MCG/ACT nasal spray Place 2 sprays into the nose daily.  . Multiple Vitamin (MULTIVITAMIN) tablet Take 1 tablet by mouth daily.  . pantoprazole (PROTONIX) 40 MG tablet take 1 tablet by mouth once daily  . progesterone 200 MG SUPP Place 200 mg vaginally at bedtime.  . propranolol (INDERAL) 20 MG tablet Take 1 tablet (20 mg total) by mouth 3 (three) times daily as needed.  . traZODone (DESYREL) 50 MG tablet Take 0.5-1 tablets (25-50 mg total) by mouth at bedtime as needed for sleep.  . [DISCONTINUED] azithromycin (ZITHROMAX) 250 MG tablet Take two tablets x 1 day and then one tablet per day for four more days.  . [DISCONTINUED] hydrocortisone (ANUSOL-HC) 25 MG suppository Place 1 suppository (25 mg total) rectally 2 (two) times daily as needed for hemorrhoids or itching.   No facility-administered encounter medications on file as of 06/29/2015.    Review of Systems  Constitutional: Negative for unexpected weight change.       She is eating.    HENT: Negative for congestion and sinus pressure.   Respiratory: Negative for cough, chest tightness and shortness of breath.   Cardiovascular: Negative for chest pain, palpitations and  leg swelling.  Gastrointestinal: Positive for nausea, vomiting, abdominal pain and diarrhea.  Genitourinary: Negative for dysuria and difficulty urinating.  Musculoskeletal: Negative for back pain and joint swelling.  Skin: Negative for color change and rash.  Neurological: Negative for dizziness, light-headedness and headaches.  Psychiatric/Behavioral: Negative for dysphoric mood and agitation.       Objective:    Physical Exam  Constitutional: She appears well-developed and well-nourished. No distress.  HENT:  Nose: Nose normal.  Mouth/Throat: Oropharynx is clear and moist.  Neck: Neck supple.  Cardiovascular: Normal rate and regular rhythm.   Pulmonary/Chest: Breath sounds normal. No respiratory distress. She has no wheezes.    Abdominal: Soft. Bowel sounds are normal. There is no tenderness.  Musculoskeletal: She exhibits no edema or tenderness.  Lymphadenopathy:    She has no cervical adenopathy.  Skin: No rash noted. No erythema.  Psychiatric: She has a normal mood and affect. Her behavior is normal.    BP 100/70 mmHg  Pulse 61  Temp(Src) 98.1 F (36.7 C) (Oral)  Resp 18  Ht 5\' 7"  (1.702 m)  Wt 153 lb 2 oz (69.457 kg)  BMI 23.98 kg/m2  SpO2 96% Wt Readings from Last 3 Encounters:  06/29/15 153 lb 2 oz (69.457 kg)  05/06/15 157 lb (71.215 kg)  02/12/15 156 lb (70.761 kg)     Lab Results  Component Value Date   WBC 7.4 05/25/2015   HGB 14.1 05/25/2015   HCT 41.8 05/25/2015   PLT 207.0 05/25/2015   GLUCOSE 96 05/25/2015   CHOL 186 05/25/2015   TRIG 117.0 05/25/2015   HDL 42.90 05/25/2015   LDLCALC 119* 05/25/2015   ALT 13 05/25/2015   AST 16 05/25/2015   NA 139 05/25/2015   K 4.1 05/25/2015   CL 103 05/25/2015   CREATININE 0.72 05/25/2015   BUN 12 05/25/2015   CO2 28 05/25/2015   TSH 1.53 05/25/2015        Assessment & Plan:   Problem List Items Addressed This Visit    Abdominal pain, epigastric - Primary    Persistent intermittent flare.  Symptoms as outlined.  Pain associated with vomiting and diarrhea.  Have discussed abdominal ultrasound/CT.  Continue protonix.  Refer to GI for evaluation and question of need for EGD. Hold on scanning.  Start a probiotic.        Relevant Orders   Ambulatory referral to Gastroenterology   GERD (gastroesophageal reflux disease)    On pantoprazole.  Intermittent flares as outlined.  Continue protonix.  Refer to GI for evaluation and question of need for EGD.        Stress    Increased stress.  Overall handling things relatively well.  Follow.            Einar Pheasant, MD

## 2015-07-04 NOTE — Assessment & Plan Note (Signed)
Increased stress.  Overall handling things relatively well.  Follow.

## 2015-07-05 ENCOUNTER — Encounter: Payer: Self-pay | Admitting: Internal Medicine

## 2015-07-05 MED ORDER — LEVOFLOXACIN 500 MG PO TABS: 500.0000 mg | ORAL_TABLET | Freq: Every day | ORAL | Status: DC

## 2015-07-05 NOTE — Telephone Encounter (Signed)
Pt states that she feels nausea, nasal congestion, pressure in head, light headed,  pt feels worse since being seen. Pt states that she is taking mucinex for three days and she stopped b/c she was feeling nausea. Pt states that she is not sure if it has to do with her gallbladder. Please advise, thanks

## 2015-07-05 NOTE — Telephone Encounter (Signed)
See my chart message.  Called pt.  rx sent in.

## 2015-07-05 NOTE — Telephone Encounter (Signed)
Called pt and discussed concerns.  Progression of sinus symptoms with increased pressure.  Has seen ENT previously for similar symptoms and persistent symptoms.  Was previously given levaquin and tolerated.  Has tried what I suggested last week and symptoms have progressed.  Had ENT appt in two weeks.  Will treat with levaquin.  rx sent in.

## 2015-07-21 ENCOUNTER — Other Ambulatory Visit: Payer: Self-pay | Admitting: Unknown Physician Specialty

## 2015-07-21 DIAGNOSIS — R1112 Projectile vomiting: Secondary | ICD-10-CM

## 2015-07-27 ENCOUNTER — Ambulatory Visit: Payer: BLUE CROSS/BLUE SHIELD

## 2015-07-27 DIAGNOSIS — J329 Chronic sinusitis, unspecified: Secondary | ICD-10-CM | POA: Diagnosis not present

## 2015-07-27 DIAGNOSIS — J301 Allergic rhinitis due to pollen: Secondary | ICD-10-CM | POA: Diagnosis not present

## 2015-07-27 DIAGNOSIS — J320 Chronic maxillary sinusitis: Secondary | ICD-10-CM | POA: Diagnosis not present

## 2015-07-29 ENCOUNTER — Ambulatory Visit
Admission: RE | Admit: 2015-07-29 | Discharge: 2015-07-29 | Disposition: A | Discharge status: Home / Self Care | Payer: BLUE CROSS/BLUE SHIELD | Source: Ambulatory Visit | Attending: Unknown Physician Specialty | Admitting: Unknown Physician Specialty

## 2015-07-29 DIAGNOSIS — R112 Nausea with vomiting, unspecified: Secondary | ICD-10-CM | POA: Insufficient documentation

## 2015-07-29 DIAGNOSIS — R1112 Projectile vomiting: Secondary | ICD-10-CM

## 2015-07-30 DIAGNOSIS — J301 Allergic rhinitis due to pollen: Secondary | ICD-10-CM | POA: Diagnosis not present

## 2015-08-03 DIAGNOSIS — K621 Rectal polyp: Secondary | ICD-10-CM | POA: Diagnosis not present

## 2015-08-03 DIAGNOSIS — K2950 Unspecified chronic gastritis without bleeding: Secondary | ICD-10-CM | POA: Diagnosis not present

## 2015-08-03 DIAGNOSIS — K3189 Other diseases of stomach and duodenum: Secondary | ICD-10-CM | POA: Diagnosis not present

## 2015-08-03 DIAGNOSIS — R112 Nausea with vomiting, unspecified: Secondary | ICD-10-CM | POA: Diagnosis not present

## 2015-08-03 DIAGNOSIS — Z86010 Personal history of colonic polyps: Secondary | ICD-10-CM | POA: Diagnosis not present

## 2015-08-03 HISTORY — PX: UPPER GI ENDOSCOPY: SHX6162

## 2015-08-03 HISTORY — PX: COLONOSCOPY: SHX174

## 2015-08-03 LAB — HM COLONOSCOPY

## 2015-08-11 ENCOUNTER — Encounter: Payer: Self-pay | Admitting: Internal Medicine

## 2015-08-11 ENCOUNTER — Ambulatory Visit (INDEPENDENT_AMBULATORY_CARE_PROVIDER_SITE_OTHER): Payer: BLUE CROSS/BLUE SHIELD | Admitting: Internal Medicine

## 2015-08-11 ENCOUNTER — Other Ambulatory Visit (HOSPITAL_COMMUNITY)
Admission: RE | Admit: 2015-08-11 | Discharge: 2015-08-11 | Disposition: A | Discharge status: Home / Self Care | Payer: BLUE CROSS/BLUE SHIELD | Source: Ambulatory Visit | Attending: Internal Medicine | Admitting: Internal Medicine

## 2015-08-11 VITALS — BP 120/80 | HR 80 | Temp 97.8°F | Resp 18 | Ht 67.0 in | Wt 147.5 lb

## 2015-08-11 DIAGNOSIS — Z01419 Encounter for gynecological examination (general) (routine) without abnormal findings: Secondary | ICD-10-CM | POA: Diagnosis not present

## 2015-08-11 DIAGNOSIS — F439 Reaction to severe stress, unspecified: Secondary | ICD-10-CM

## 2015-08-11 DIAGNOSIS — Z1239 Encounter for other screening for malignant neoplasm of breast: Secondary | ICD-10-CM | POA: Diagnosis not present

## 2015-08-11 DIAGNOSIS — Z1151 Encounter for screening for human papillomavirus (HPV): Secondary | ICD-10-CM | POA: Diagnosis not present

## 2015-08-11 DIAGNOSIS — N6489 Other specified disorders of breast: Secondary | ICD-10-CM | POA: Diagnosis not present

## 2015-08-11 DIAGNOSIS — Z658 Other specified problems related to psychosocial circumstances: Secondary | ICD-10-CM

## 2015-08-11 DIAGNOSIS — Z124 Encounter for screening for malignant neoplasm of cervix: Secondary | ICD-10-CM

## 2015-08-11 DIAGNOSIS — I471 Supraventricular tachycardia: Secondary | ICD-10-CM

## 2015-08-11 DIAGNOSIS — Z0001 Encounter for general adult medical examination with abnormal findings: Secondary | ICD-10-CM | POA: Diagnosis not present

## 2015-08-11 DIAGNOSIS — Z91048 Other nonmedicinal substance allergy status: Secondary | ICD-10-CM

## 2015-08-11 DIAGNOSIS — Z9109 Other allergy status, other than to drugs and biological substances: Secondary | ICD-10-CM

## 2015-08-11 DIAGNOSIS — K219 Gastro-esophageal reflux disease without esophagitis: Secondary | ICD-10-CM

## 2015-08-11 DIAGNOSIS — Z0000 Encounter for general adult medical examination without abnormal findings: Secondary | ICD-10-CM

## 2015-08-11 NOTE — Progress Notes (Signed)
Patient ID: Theresa Buck, female   DOB: [birth year], 60 y.o.   MRN: [id number]   Subjective:    Patient ID: Theresa Buck, female    DOB: [birth year], 60 y.o.   MRN: [id number]  HPI  Patient here for her physical exam.  She is doing better.  Saw GI.  Had abdominal ultrasound, colonoscopy and upper endoscopy.  On protonix.  Stays active.  No cardiac symptoms with increased activity or exertion.  No sob.  Increased stress.  Overall feels she is handling stress relatively well.      Past Medical History  Diagnosis Date  . H/O sinusitis   . Deviated septum   . Hx of degenerative disc disease   . Deviated septum   . Allergy     recurring sinus problems   No past surgical history on file. Family History  Problem Relation Age of Onset  . ALS Father   . Diabetes Brother     multiple  . Brain cancer Mother   . Heart disease Son     myocardial infarction, s/p CABG   Social History   Social History  . Marital Status: Widowed    Spouse Name: N/A  . Number of Children: 3  . Years of Education: N/A   Social History Main Topics  . Smoking status: Never Smoker   . Smokeless tobacco: Never Used  . Alcohol Use: No  . Drug Use: No  . Sexual Activity: Not Asked   Other Topics Concern  . None   Social History Narrative    Outpatient Encounter Prescriptions as of 08/11/2015  Medication Sig  . Ascorbic Acid (VITAMIN C) 100 MG tablet Take 100 mg by mouth daily.  Marland Kitchen azelastine (ASTELIN) 137 MCG/SPRAY nasal spray Place 1 spray into the nose 2 (two) times daily. Use in each nostril as directed  . BIOTIN PO Take by mouth 2 (two) times daily.  Marland Kitchen diltiazem (CARDIZEM) 30 MG tablet Take 1 tablet (30 mg total) by mouth 4 (four) times daily as needed.  Marland Kitchen estradiol (ESTRACE) 1 MG tablet Take 1 mg by mouth daily.  . fluticasone (FLONASE) 50 MCG/ACT nasal spray Place 2 sprays into both nostrils daily.  . mometasone (NASONEX) 50 MCG/ACT nasal spray Place 2 sprays into the nose daily.  .  Multiple Vitamin (MULTIVITAMIN) tablet Take 1 tablet by mouth daily.  . pantoprazole (PROTONIX) 40 MG tablet take 1 tablet by mouth once daily  . progesterone 200 MG SUPP Place 200 mg vaginally at bedtime.  . propranolol (INDERAL) 20 MG tablet Take 1 tablet (20 mg total) by mouth 3 (three) times daily as needed.  . traZODone (DESYREL) 50 MG tablet Take 0.5-1 tablets (25-50 mg total) by mouth at bedtime as needed for sleep.  . [DISCONTINUED] levofloxacin (LEVAQUIN) 500 MG tablet Take 1 tablet (500 mg total) by mouth daily.   No facility-administered encounter medications on file as of 08/11/2015.    Review of Systems  Constitutional: Negative for appetite change and unexpected weight change.  HENT: Negative for congestion and sinus pressure.   Eyes: Negative for pain and visual disturbance.  Respiratory: Negative for cough, chest tightness and shortness of breath.   Cardiovascular: Negative for chest pain, palpitations and leg swelling.  Gastrointestinal: Negative for nausea, vomiting, abdominal pain and diarrhea.  Genitourinary: Negative for dysuria and difficulty urinating.  Musculoskeletal: Negative for back pain and joint swelling.  Skin: Negative for color change and rash.  Neurological: Negative for dizziness, light-headedness and headaches.  Hematological: Negative for adenopathy. Does not bruise/bleed easily.  Psychiatric/Behavioral: Negative for dysphoric mood and agitation.       Objective:    Physical Exam  Constitutional: She appears well-developed and well-nourished. No distress.  HENT:  Nose: Nose normal.  Mouth/Throat: Oropharynx is clear and moist.  Neck: Neck supple. No thyromegaly present.  Cardiovascular: Normal rate and regular rhythm.   Pulmonary/Chest: Breath sounds normal. No respiratory distress. She has no wheezes.  Breast exam reveals left breast fullness - 2-3:00.  No other palpable abnormality.  No nipple discharge or nipple retraction present.  No  axillary adenopathy present.   Abdominal: Soft. Bowel sounds are normal. There is no tenderness.  Musculoskeletal: She exhibits no edema or tenderness.  Lymphadenopathy:    She has no cervical adenopathy.  Skin: No rash noted. No erythema.  Psychiatric: She has a normal mood and affect. Her behavior is normal.    BP 120/80 mmHg  Pulse 80  Temp(Src) 97.8 F (36.6 C) (Oral)  Resp 18  Ht 5\' 7"  (1.702 m)  Wt 147 lb 8 oz (66.906 kg)  BMI 23.10 kg/m2  SpO2 97% Wt Readings from Last 3 Encounters:  08/11/15 147 lb 8 oz (66.906 kg)  06/29/15 153 lb 2 oz (69.457 kg)  05/06/15 157 lb (71.215 kg)     Lab Results  Component Value Date   WBC 7.4 05/25/2015   HGB 14.1 05/25/2015   HCT 41.8 05/25/2015   PLT 207.0 05/25/2015   GLUCOSE 96 05/25/2015   CHOL 186 05/25/2015   TRIG 117.0 05/25/2015   HDL 42.90 05/25/2015   LDLCALC 119* 05/25/2015   ALT 13 05/25/2015   AST 16 05/25/2015   NA 139 05/25/2015   K 4.1 05/25/2015   CL 103 05/25/2015   CREATININE 0.72 05/25/2015   BUN 12 05/25/2015   CO2 28 05/25/2015   TSH 1.53 05/25/2015    US Abdomen Complete  07/29/2015  CLINICAL DATA:  Projectile vomiting with nausea for past 3-4 months. EXAM: ABDOMEN ULTRASOUND COMPLETE COMPARISON:  None. FINDINGS: Gallbladder: No gallstones or wall thickening visualized. No sonographic Murphy sign noted by sonographer. Common bile duct: Diameter: 3 mm, within normal limits. Liver: No focal lesion identified. Within normal limits in parenchymal echogenicity. IVC: No abnormality visualized. Pancreas: Visualized portion unremarkable. Spleen: Size and appearance within normal limits. Right Kidney: Length: 10.2 cm. Echogenicity within normal limits. No mass or hydronephrosis visualized. Left Kidney: Length: 10.9 cm. Echogenicity within normal limits. No mass or hydronephrosis visualized. Abdominal aorta: No aneurysm visualized. Other findings: None. IMPRESSION: Negative abdomen ultrasound. Electronically Signed    By: Earle Gell M.D.   On: 07/29/2015 08:44       Assessment & Plan:   Problem List Items Addressed This Visit    Environmental allergies    Controlled on current regimen.  Follow.       Fullness of breast    Schedule diagnostic left breast mammogram and ultrasound.        Relevant Orders   MM Digital Diagnostic Unilat L   US BREAST LTD UNI LEFT INC AXILLA   GERD (gastroesophageal reflux disease)    On protonix.  Seeing GI.  Undergoing w/up.        Health care maintenance    Physical today 08/11/15.  PAP 08/11/15.  Mammogram 11/13/14 - Birads I.  Schedule diagnostic mammogram left breast to evaluate breast fullness.  Colonoscopy 05/19/12 - recommended f/u in five years.        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  Has propranolol to take prn.  Doing well.        Stress    Increased stress.  Discussed with her today.  Does not feel needs any further intervention.  Follow.         Other Visit Diagnoses    Screening breast examination        Relevant Orders    MM DIGITAL SCREENING BILATERAL    Pap smear for cervical cancer screening        Relevant Orders    Cytology - PAP (Completed)        Einar Pheasant, MD

## 2015-08-11 NOTE — Progress Notes (Signed)
Pre-visit discussion using our clinic review tool. No additional management support is needed unless otherwise documented below in the visit note.  

## 2015-08-12 LAB — CYTOLOGY - PAP

## 2015-08-15 ENCOUNTER — Encounter: Payer: Self-pay | Admitting: Internal Medicine

## 2015-08-22 ENCOUNTER — Encounter: Payer: Self-pay | Admitting: Internal Medicine

## 2015-08-22 NOTE — Assessment & Plan Note (Signed)
Increased stress.  Discussed with her today.  Does not feel needs any further intervention.  Follow.   

## 2015-08-22 NOTE — Assessment & Plan Note (Signed)
Controlled on current regimen.  Follow.  

## 2015-08-22 NOTE — Assessment & Plan Note (Signed)
Physical today 08/11/15.  PAP 08/11/15.  Mammogram 11/13/14 - Birads I.  Schedule diagnostic mammogram left breast to evaluate breast fullness.  Colonoscopy 05/19/12 - recommended f/u in five years.

## 2015-08-22 NOTE — Assessment & Plan Note (Signed)
Schedule diagnostic left breast mammogram and ultrasound.

## 2015-08-22 NOTE — Assessment & Plan Note (Addendum)
On protonix.  Seeing GI.  Undergoing w/up.

## 2015-08-22 NOTE — Assessment & Plan Note (Signed)
Previously evaluated by Dr Rockey Situ.  Has propranolol to take prn.  Doing well.

## 2015-08-26 ENCOUNTER — Ambulatory Visit
Admission: RE | Admit: 2015-08-26 | Discharge: 2015-08-26 | Disposition: A | Discharge status: Home / Self Care | Payer: BLUE CROSS/BLUE SHIELD | Source: Ambulatory Visit | Attending: Internal Medicine | Admitting: Internal Medicine

## 2015-08-26 ENCOUNTER — Other Ambulatory Visit: Payer: Self-pay | Admitting: Internal Medicine

## 2015-08-26 DIAGNOSIS — N6002 Solitary cyst of left breast: Secondary | ICD-10-CM | POA: Diagnosis not present

## 2015-08-26 DIAGNOSIS — N6489 Other specified disorders of breast: Secondary | ICD-10-CM

## 2015-08-26 DIAGNOSIS — N644 Mastodynia: Secondary | ICD-10-CM | POA: Diagnosis not present

## 2015-08-27 ENCOUNTER — Encounter: Payer: Self-pay | Admitting: Internal Medicine

## 2015-09-02 ENCOUNTER — Other Ambulatory Visit: Payer: Self-pay | Admitting: Internal Medicine

## 2015-09-13 ENCOUNTER — Encounter: Payer: Self-pay | Admitting: Internal Medicine

## 2015-11-11 ENCOUNTER — Encounter: Payer: Self-pay | Admitting: Internal Medicine

## 2015-11-11 DIAGNOSIS — Z1239 Encounter for other screening for malignant neoplasm of breast: Secondary | ICD-10-CM

## 2015-11-14 NOTE — Telephone Encounter (Signed)
I am not sure why her mammogram was cancelled.  See her my chart message.  I have reordered.  Please schedule and please schedule for late pm appt.  Thanks.

## 2015-11-15 ENCOUNTER — Ambulatory Visit: Payer: BLUE CROSS/BLUE SHIELD

## 2015-11-15 ENCOUNTER — Other Ambulatory Visit: Payer: Self-pay | Admitting: Internal Medicine

## 2015-11-16 NOTE — Telephone Encounter (Signed)
LOV 08/11/2015. Renaldo Fiddler, CMA

## 2015-11-17 NOTE — Telephone Encounter (Signed)
ok'd rx for flonase  

## 2015-12-08 ENCOUNTER — Encounter: Payer: Self-pay | Admitting: Internal Medicine

## 2015-12-15 ENCOUNTER — Ambulatory Visit
Admission: RE | Admit: 2015-12-15 | Discharge: 2015-12-15 | Disposition: A | Discharge status: Home / Self Care | Payer: BLUE CROSS/BLUE SHIELD | Source: Ambulatory Visit | Attending: Internal Medicine | Admitting: Internal Medicine

## 2015-12-15 ENCOUNTER — Other Ambulatory Visit: Payer: Self-pay | Admitting: Internal Medicine

## 2015-12-15 DIAGNOSIS — Z1239 Encounter for other screening for malignant neoplasm of breast: Secondary | ICD-10-CM

## 2015-12-15 DIAGNOSIS — Z1231 Encounter for screening mammogram for malignant neoplasm of breast: Secondary | ICD-10-CM | POA: Insufficient documentation

## 2015-12-24 ENCOUNTER — Ambulatory Visit
Admission: RE | Admit: 2015-12-24 | Discharge: 2015-12-24 | Disposition: A | Discharge status: Home / Self Care | Payer: BLUE CROSS/BLUE SHIELD | Source: Ambulatory Visit | Attending: Internal Medicine | Admitting: Internal Medicine

## 2015-12-24 DIAGNOSIS — Z1239 Encounter for other screening for malignant neoplasm of breast: Secondary | ICD-10-CM

## 2016-01-30 DIAGNOSIS — J019 Acute sinusitis, unspecified: Secondary | ICD-10-CM | POA: Diagnosis not present

## 2016-02-24 DIAGNOSIS — J301 Allergic rhinitis due to pollen: Secondary | ICD-10-CM | POA: Diagnosis not present

## 2016-02-24 DIAGNOSIS — J340 Abscess, furuncle and carbuncle of nose: Secondary | ICD-10-CM | POA: Diagnosis not present

## 2016-02-24 DIAGNOSIS — J019 Acute sinusitis, unspecified: Secondary | ICD-10-CM | POA: Diagnosis not present

## 2016-03-20 ENCOUNTER — Other Ambulatory Visit: Payer: Self-pay | Admitting: Internal Medicine

## 2016-03-22 DIAGNOSIS — Z0100 Encounter for examination of eyes and vision without abnormal findings: Secondary | ICD-10-CM | POA: Diagnosis not present

## 2016-03-23 ENCOUNTER — Encounter: Payer: Self-pay | Admitting: Internal Medicine

## 2016-03-24 ENCOUNTER — Telehealth: Payer: Self-pay | Admitting: *Deleted

## 2016-03-24 MED ORDER — PANTOPRAZOLE SODIUM 40 MG PO TBEC: 40.0000 mg | DELAYED_RELEASE_TABLET | Freq: Every day | ORAL | 5 refills | Status: DC

## 2016-03-24 NOTE — Telephone Encounter (Signed)
Pt requested a medication refill for protonix Pharmacy [address]

## 2016-03-24 NOTE — Telephone Encounter (Signed)
Sent rx again

## 2016-04-26 ENCOUNTER — Encounter: Payer: Self-pay | Admitting: Internal Medicine

## 2016-04-27 MED ORDER — SCOPOLAMINE 1 MG/3DAYS TD PT72: 1.0000 | MEDICATED_PATCH | TRANSDERMAL | 0 refills | Status: DC

## 2016-04-27 NOTE — Telephone Encounter (Signed)
rx sent in for scopolamine patches.  

## 2016-05-01 ENCOUNTER — Ambulatory Visit: Payer: BLUE CROSS/BLUE SHIELD

## 2016-05-26 ENCOUNTER — Ambulatory Visit (INDEPENDENT_AMBULATORY_CARE_PROVIDER_SITE_OTHER): Payer: BLUE CROSS/BLUE SHIELD | Admitting: Family Medicine

## 2016-05-26 ENCOUNTER — Ambulatory Visit: Payer: BLUE CROSS/BLUE SHIELD | Admitting: Family Medicine

## 2016-05-26 ENCOUNTER — Encounter: Payer: Self-pay | Admitting: Family Medicine

## 2016-05-26 DIAGNOSIS — S39012A Strain of muscle, fascia and tendon of lower back, initial encounter: Secondary | ICD-10-CM | POA: Insufficient documentation

## 2016-05-26 NOTE — Assessment & Plan Note (Signed)
History most consistent with muscular back strain. Benign exam today. Neurologically intact. No red flags. Discussed heat and ibuprofen. She'll continue to monitor. Given return precautions.

## 2016-05-26 NOTE — Progress Notes (Signed)
  Tommi Rumps, MD Phone: [phone]  Theresa Buck is a 61 y.o. female who presents today for same-day visit.  Patient notes she was on a cruise and carried her bags off the cruise and then subsequently had some soreness in her right mid back. Notes it feels tight. There is only so far she can turn without it being sore. She occasionally feels a pop. No radiation. No numbness or weakness. No loss of bowel or bladder function, saddle anesthesia, or fevers. No change in urinary frequency or urgency. No dysuria. Note she feels improved today after using ibuprofen for several days.  ROS see history of present illness  Objective  Physical Exam Vitals:   05/26/16 1531  BP: 132/62  Pulse: 71  Temp: 97.9 F (36.6 C)    BP Readings from Last 3 Encounters:  05/26/16 132/62  08/11/15 120/80  06/29/15 100/70   Wt Readings from Last 3 Encounters:  08/11/15 147 lb 8 oz (66.9 kg)  06/29/15 153 lb 2 oz (69.5 kg)  05/06/15 157 lb (71.2 kg)    Physical Exam  Constitutional: She is well-developed, well-nourished, and in no distress.  Cardiovascular: Normal rate, regular rhythm and normal heart sounds.   Pulmonary/Chest: Effort normal and breath sounds normal.  Musculoskeletal:  No midline spine tenderness, no midline spine step-off, no muscular back tenderness, no skin changes in the middle and lower back  Neurological: She is alert. Gait normal.  5 out of 5 strength bilateral quads, hamstrings, plantar flexion, and dorsiflexion, sensation to light touch intact in bilateral lower extremities     Assessment/Plan: Please see individual problem list.  Back strain History most consistent with muscular back strain. Benign exam today. Neurologically intact. No red flags. Discussed heat and ibuprofen. She'll continue to monitor. Given return precautions.   Tommi Rumps, MD [facility]

## 2016-05-26 NOTE — Patient Instructions (Signed)
Nice to see you. You likely strained a muscle in your back. You can use heat and ibuprofen for this. If you develop worsening pain, numbness, weakness, loss of bowel or bladder function, numbness or tingling or legs, or any new or changing symptoms please seek medical attention immediately.

## 2016-05-26 NOTE — Progress Notes (Signed)
Pre visit review using our clinic review tool, if applicable. No additional management support is needed unless otherwise documented below in the visit note. 

## 2016-09-27 ENCOUNTER — Other Ambulatory Visit: Payer: Self-pay | Admitting: Internal Medicine

## 2016-09-27 DIAGNOSIS — N951 Menopausal and female climacteric states: Secondary | ICD-10-CM | POA: Diagnosis not present

## 2016-09-27 NOTE — Telephone Encounter (Signed)
Last V was 08/11/15, last refills were on 11/17/15 and 11/28/15, Please advise, no follow up scheduled

## 2016-09-28 NOTE — Telephone Encounter (Signed)
Theresa Buck can you schedule the CPE for her and then I will refill the meds, I see that there are a lot of holds in August not sure for what which is why I need your assistance, thanks

## 2016-09-28 NOTE — Telephone Encounter (Signed)
Refilled till her upcoming appt, thanks

## 2016-09-28 NOTE — Telephone Encounter (Signed)
Needs cpe scheduled.  Once scheduled, then ok to refill until appt.

## 2016-09-28 NOTE — Telephone Encounter (Signed)
Pt scheduled for 12/14/16

## 2016-10-04 ENCOUNTER — Encounter: Payer: Self-pay | Admitting: Internal Medicine

## 2016-10-04 NOTE — Telephone Encounter (Signed)
Attempted to reach patient for appt tomorrow, left a message to call me back thanks

## 2016-10-04 NOTE — Telephone Encounter (Signed)
I would prefer to see her and discuss - especially since it has been a while since I have seen her.  See if she can come in tomorrow at 12:00.  Thanks

## 2016-10-05 ENCOUNTER — Other Ambulatory Visit: Payer: Self-pay | Admitting: Internal Medicine

## 2016-10-05 ENCOUNTER — Ambulatory Visit: Payer: BLUE CROSS/BLUE SHIELD | Admitting: Internal Medicine

## 2016-10-05 DIAGNOSIS — K649 Unspecified hemorrhoids: Secondary | ICD-10-CM

## 2016-10-05 NOTE — Telephone Encounter (Signed)
I have placed an order for referral to surgery - Dr Bary Castilla.  He will evaluate and see what needs to be done.  Sometimes the do "banding" to remove the hemorrhoid.  Someone should be contacting her with appt date and time.  Let me know if any problems.

## 2016-10-05 NOTE — Progress Notes (Signed)
Order placed for surgery referral.  

## 2016-10-05 NOTE — Telephone Encounter (Signed)
Spoke with the patient, she is okay with the referral, she said she saw Dr. Tiffany Kocher prior for a colonscopy, would that be who you refer too? If not she is okay with who you feel is the best option.  She also asked what you think they will actually do? She would need a early am or late day appt, as she works in [place].  She is off Tuesday afternoon next week and would be available if they have something then.  Please advise, thanks

## 2016-10-05 NOTE — Telephone Encounter (Signed)
Please notify pt I will not be in the office next week.  Will hold for work in appt.  Please send Theresa Buck a message to hold for work in appt.  Also notify her that if she is having persistent problems with hemorrhoids, would need to see surgery. If she wants me to go ahead and place an order for appt - let me know.

## 2016-10-05 NOTE — Telephone Encounter (Signed)
I spoke with the patient and she agreed to referral. Thanks

## 2016-10-06 ENCOUNTER — Encounter: Payer: Self-pay | Admitting: Internal Medicine

## 2016-10-06 ENCOUNTER — Encounter: Payer: Self-pay | Admitting: *Deleted

## 2016-10-10 DIAGNOSIS — D225 Melanocytic nevi of trunk: Secondary | ICD-10-CM | POA: Diagnosis not present

## 2016-10-10 DIAGNOSIS — L821 Other seborrheic keratosis: Secondary | ICD-10-CM | POA: Diagnosis not present

## 2016-10-10 DIAGNOSIS — D485 Neoplasm of uncertain behavior of skin: Secondary | ICD-10-CM | POA: Diagnosis not present

## 2016-10-10 DIAGNOSIS — L720 Epidermal cyst: Secondary | ICD-10-CM | POA: Diagnosis not present

## 2016-10-12 ENCOUNTER — Telehealth: Payer: Self-pay | Admitting: General Surgery

## 2016-10-12 NOTE — Telephone Encounter (Signed)
Left message on c# for pt to call & reschedule appt on 10-16-16 @ 4:40 to an earlier time that same day or another day with Dr Jamal Collin

## 2016-10-16 ENCOUNTER — Ambulatory Visit: Payer: BLUE CROSS/BLUE SHIELD | Admitting: General Surgery

## 2016-10-17 ENCOUNTER — Encounter: Payer: Self-pay | Admitting: Internal Medicine

## 2016-10-18 ENCOUNTER — Encounter: Payer: Self-pay | Admitting: General Surgery

## 2016-10-18 ENCOUNTER — Ambulatory Visit (INDEPENDENT_AMBULATORY_CARE_PROVIDER_SITE_OTHER): Payer: BLUE CROSS/BLUE SHIELD | Admitting: General Surgery

## 2016-10-18 VITALS — BP 122/68 | HR 72 | Resp 12 | Ht 66.7 in | Wt 148.0 lb

## 2016-10-18 DIAGNOSIS — K644 Residual hemorrhoidal skin tags: Secondary | ICD-10-CM

## 2016-10-18 NOTE — Patient Instructions (Addendum)
The patient is aware to call back for any questions or concerns.  Hemorrhoids Hemorrhoids are swollen veins in and around the rectum or anus. There are two types of hemorrhoids:  Internal hemorrhoids. These occur in the veins that are just inside the rectum. They may poke through to the outside and become irritated and painful.  External hemorrhoids. These occur in the veins that are outside of the anus and can be felt as a painful swelling or hard lump near the anus.  Most hemorrhoids do not cause serious problems, and they can be managed with home treatments such as diet and lifestyle changes. If home treatments do not help your symptoms, procedures can be done to shrink or remove the hemorrhoids. What are the causes? This condition is caused by increased pressure in the anal area. This pressure may result from various things, including:  Constipation.  Straining to have a bowel movement.  Diarrhea.  Pregnancy.  Obesity.  Sitting for long periods of time.  Heavy lifting or other activity that causes you to strain.  Anal sex.  What are the signs or symptoms? Symptoms of this condition include:  Pain.  Anal itching or irritation.  Rectal bleeding.  Leakage of stool (feces).  Anal swelling.  One or more lumps around the anus.  How is this diagnosed? This condition can often be diagnosed through a visual exam. Other exams or tests may also be done, such as:  Examination of the rectal area with a gloved hand (digital rectal exam).  Examination of the anal canal using a small tube (anoscope).  A blood test, if you have lost a significant amount of blood.  A test to look inside the colon (sigmoidoscopy or colonoscopy).  How is this treated? This condition can usually be treated at home. However, various procedures may be done if dietary changes, lifestyle changes, and other home treatments do not help your symptoms. These procedures can help make the hemorrhoids  smaller or remove them completely. Some of these procedures involve surgery, and others do not. Common procedures include:  Rubber band ligation. Rubber bands are placed at the base of the hemorrhoids to cut off the blood supply to them.  Sclerotherapy. Medicine is injected into the hemorrhoids to shrink them.  Infrared coagulation. A type of light energy is used to get rid of the hemorrhoids.  Hemorrhoidectomy surgery. The hemorrhoids are surgically removed, and the veins that supply them are tied off.  Stapled hemorrhoidopexy surgery. A circular stapling device is used to remove the hemorrhoids and use staples to cut off the blood supply to them.  Follow these instructions at home: Eating and drinking  Eat foods that have a lot of fiber in them, such as whole grains, beans, nuts, fruits, and vegetables. Ask your health care provider about taking products that have added fiber (fiber supplements).  Drink enough fluid to keep your urine clear or pale yellow. Managing pain and swelling  Take warm sitz baths for 20 minutes, 3-4 times a day to ease pain and discomfort.  If directed, apply ice to the affected area. Using ice packs between sitz baths may be helpful. ? Put ice in a plastic bag. ? Place a towel between your skin and the bag. ? Leave the ice on for 20 minutes, 2-3 times a day. General instructions  Take over-the-counter and prescription medicines only as told by your health care provider.  Use medicated creams or suppositories as told.  Exercise regularly.  Go to the bathroom  when you have the urge to have a bowel movement. Do not wait.  Avoid straining to have bowel movements.  Keep the anal area dry and clean. Use wet toilet paper or moist towelettes after a bowel movement.  Do not sit on the toilet for long periods of time. This increases blood pooling and pain. Contact a health care provider if:  You have increasing pain and swelling that are not controlled by  treatment or medicine.  You have uncontrolled bleeding.  You have difficulty having a bowel movement, or you are unable to have a bowel movement.  You have pain or inflammation outside the area of the hemorrhoids. This information is not intended to replace advice given to you by your health care provider. Make sure you discuss any questions you have with your health care provider. Document Released: 04/07/2000 Document Revised: 09/08/2015 Document Reviewed: 12/23/2014 Elsevier Interactive Patient Education  2017 [place].  Surgical Procedures for Hemorrhoids, Care After Refer to this sheet in the next few weeks. These instructions provide you with information about caring for yourself after your procedure. Your health care provider may also give you more specific instructions. Your treatment has been planned according to current medical practices, but problems sometimes occur. Call your health care provider if you have any problems or questions after your procedure. What can I expect after the procedure? After the procedure, it is common to have:  Rectal pain.  Pain when you are having a bowel movement.  Slight rectal bleeding.  Follow these instructions at home: Medicines  Take over-the-counter and prescription medicines only as told by your health care provider.  Do not drive or operate heavy machinery while taking prescription pain medicine.  Use a stool softener or a bulk laxative as told by your health care provider. Activity  Rest at home. Return to your normal activities as told by your health care provider.  Do not lift anything that is heavier than 10 lb (4.5 kg).  Do not sit for long periods of time. Take a walk every day or as told by your health care provider.  Do not strain to have a bowel movement. Do not spend a long time sitting on the toilet. Eating and drinking  Eat foods that contain fiber, such as whole grains, beans, nuts, fruits, and  vegetables.  Drink enough fluid to keep your urine clear or pale yellow. General instructions  Sit in a warm bath 2-3 times per day to relieve soreness or itching.  Keep all follow-up visits as told by your health care provider. This is important. Contact a health care provider if:  Your pain medicine is not helping.  You have a fever or chills.  You become constipated.  You have trouble passing urine. Get help right away if:  You have very bad rectal pain.  You have heavy bleeding from your rectum. This information is not intended to replace advice given to you by your health care provider. Make sure you discuss any questions you have with your health care provider. Document Released: 07/01/2003 Document Revised: 09/16/2015 Document Reviewed: 07/06/2014 Elsevier Interactive Patient Education  [employer].

## 2016-10-18 NOTE — Progress Notes (Signed)
Patient ID: Theresa Buck, female   DOB: 11/21/1955, 61 y.o.   MRN: [id number]  Chief Complaint  Patient presents with  . Rectal Problems    hemorrhoids    HPI Theresa Buck is a 61 y.o. female.  Her for evaluation of possible hemorrhoids.  She states she has had trouble for several years. She states she has itching and swelling with pain.  Bowels move 5-6 times a day and then some weeks she has a hard time going like "rabbit pellets". She states the hemorrhoids flare up 2-3 times a month. Occasional bleeding when the hemorrhoids flare up. She states she is having burning in left upper abdomen that stated 2 weeks ago and has gotten worse. She had an upper and lower endoscopy done by Dr Vira Agar last year.  HPI  Past Medical History:  Diagnosis Date  . Allergy    recurring sinus problems  . Deviated septum   . Deviated septum   . H/O sinusitis   . Hx of degenerative disc disease     Past Surgical History:  Procedure Laterality Date  . BACK SURGERY  09/26/2013  . COLONOSCOPY  08/03/2015   Dr Vira Agar  . UPPER GI ENDOSCOPY  08/03/2015   Dr Vira Agar    Family History  Problem Relation Age of Onset  . Brain cancer Mother   . ALS Father   . Diabetes Brother        multiple  . Heart disease Son        myocardial infarction, s/p CABG  . Throat cancer Brother     Social History Social History  Substance Use Topics  . Smoking status: Never Smoker  . Smokeless tobacco: Never Used  . Alcohol use No    Allergies  Allergen Reactions  . Prednisone Other (See Comments)    Gi upset  . Ceftin [Cefuroxime Axetil] Rash    Current Outpatient Prescriptions  Medication Sig Dispense Refill  . Ascorbic Acid (VITAMIN C) 100 MG tablet Take 100 mg by mouth daily.    Marland Kitchen BIOTIN PO Take by mouth 2 (two) times daily.    Marland Kitchen diltiazem (CARDIZEM) 30 MG tablet Take 1 tablet (30 mg total) by mouth 4 (four) times daily as needed. 90 tablet 6  . estradiol (ESTRACE) 1 MG tablet Take  1 mg by mouth daily.    . fluticasone (FLONASE) 50 MCG/ACT nasal spray instill 2 spray into each nostril once daily 16 g 5  . montelukast (SINGULAIR) 10 MG tablet 10 mg at bedtime.   1  . Multiple Vitamin (MULTIVITAMIN) tablet Take 1 tablet by mouth daily.    . pantoprazole (PROTONIX) 40 MG tablet Take 1 tablet (40 mg total) by mouth daily. 30 tablet 5  . Probiotic Product (PROBIOTIC ADVANCED PO) Take by mouth.    . progesterone 200 MG SUPP Place 200 mg vaginally at bedtime.    . propranolol (INDERAL) 20 MG tablet Take 1 tablet (20 mg total) by mouth 3 (three) times daily as needed. 90 tablet 1  . scopolamine (TRANSDERM-SCOP, 1.5 MG,) 1 MG/3DAYS Place 1 patch (1.5 mg total) onto the skin every 3 (three) days. 10 patch 0  . traZODone (DESYREL) 50 MG tablet take 1/2 to 1 tablets by mouth at bedtime if needed for sleep 30 tablet 3   No current facility-administered medications for this visit.     Review of Systems Review of Systems  Constitutional: Negative.   Respiratory: Negative.   Cardiovascular: Negative.   Gastrointestinal:  Positive for abdominal pain, constipation and diarrhea.    Blood pressure 122/68, pulse 72, resp. rate 12, height 5' 6.7" (1.694 m), weight 148 lb (67.1 kg), last menstrual period 07/15/2012.  Physical Exam Physical Exam  Constitutional: She is oriented to person, place, and time. She appears well-developed and well-nourished.  HENT:  Mouth/Throat: Oropharynx is clear and moist.  Eyes: Conjunctivae are normal. No scleral icterus.  Neck: Neck supple.  Cardiovascular: Normal rate, regular rhythm and normal heart sounds.   Pulmonary/Chest: Effort normal and breath sounds normal.  Abdominal: Soft. Bowel sounds are normal. There is no tenderness.  Genitourinary: Rectal exam shows external hemorrhoid.  Genitourinary Comments: 1 cm external hemorrhoid at 11 o'clock and 3 o'clock. DRE with no palpable abnormality  Lymphadenopathy:    She has no cervical  adenopathy.  Neurological: She is alert and oriented to person, place, and time.  Skin: Skin is warm and dry.  Psychiatric: Her behavior is normal.    Data Reviewed Prior notes, pathology and endoscopy reports  Assessment    External hemorrhoids, symptomatic, long term. Pt advised on option of hemorrhoidectomy.     Plan         HPI, Physical Exam, Assessment and Plan have been scribed under the direction and in the presence of Mckinley Jewel, MD Karie Fetch, RN I have completed the exam and reviewed the above documentation for accuracy and completeness.  I agree with the above.  Haematologist has been used and any errors in dictation or transcription are unintentional.  Markies Mowatt G. Jamal Collin, M.D., F.A.C.S.   Junie Panning G 10/19/2016, 12:14 PM  Patient wishes to call the office back to schedule hemorrhoidectomy. This patient reports she may need to wait until October due to her schedule. Surgery instructions were provided and reviewed with the patient today.   Dominga Ferry, CMA

## 2016-10-27 ENCOUNTER — Encounter: Payer: Self-pay | Admitting: General Surgery

## 2016-12-08 ENCOUNTER — Ambulatory Visit (INDEPENDENT_AMBULATORY_CARE_PROVIDER_SITE_OTHER): Payer: BLUE CROSS/BLUE SHIELD | Admitting: Internal Medicine

## 2016-12-08 ENCOUNTER — Encounter: Payer: Self-pay | Admitting: Internal Medicine

## 2016-12-08 VITALS — BP 124/64 | HR 68 | Temp 98.6°F | Resp 12 | Ht 66.54 in | Wt 147.0 lb

## 2016-12-08 DIAGNOSIS — F439 Reaction to severe stress, unspecified: Secondary | ICD-10-CM

## 2016-12-08 DIAGNOSIS — I471 Supraventricular tachycardia: Secondary | ICD-10-CM | POA: Diagnosis not present

## 2016-12-08 DIAGNOSIS — K649 Unspecified hemorrhoids: Secondary | ICD-10-CM | POA: Diagnosis not present

## 2016-12-08 DIAGNOSIS — Z1231 Encounter for screening mammogram for malignant neoplasm of breast: Secondary | ICD-10-CM

## 2016-12-08 DIAGNOSIS — Z13220 Encounter for screening for lipoid disorders: Secondary | ICD-10-CM

## 2016-12-08 DIAGNOSIS — K219 Gastro-esophageal reflux disease without esophagitis: Secondary | ICD-10-CM | POA: Diagnosis not present

## 2016-12-08 DIAGNOSIS — Z0000 Encounter for general adult medical examination without abnormal findings: Secondary | ICD-10-CM | POA: Diagnosis not present

## 2016-12-08 DIAGNOSIS — Z1239 Encounter for other screening for malignant neoplasm of breast: Secondary | ICD-10-CM

## 2016-12-08 NOTE — Progress Notes (Signed)
Patient ID: Theresa Buck, female   DOB: July 14, 1955, 61 y.o.   MRN: [id number]   Subjective:    Patient ID: Theresa Buck, female    DOB: April 19, 1956, 61 y.o.   MRN: [id number]  HPI  Patient here for her physical exam.  She reports she is doing well.  Handling stress.  Working.  Increased work stress.  Does not feel she needs anything more at this time.  No chest pain.  No sob.  No acid reflux.  No abdominal pain.  Bowels moving.  She has been having problems with her hemorrhoids.  Some bleeding.  Saw Dr Jamal Collin.  Planning for hemorrhoidectomy 01/2017.     Past Medical History:  Diagnosis Date  . Allergy    recurring sinus problems  . Deviated septum   . Deviated septum   . H/O sinusitis   . Hx of degenerative disc disease    Past Surgical History:  Procedure Laterality Date  . BACK SURGERY  09/26/2013  . COLONOSCOPY  08/03/2015   Dr Vira Agar  . UPPER GI ENDOSCOPY  08/03/2015   Dr Vira Agar   Family History  Problem Relation Age of Onset  . Brain cancer Mother   . ALS Father   . Diabetes Brother        multiple  . Heart disease Son        myocardial infarction, s/p CABG  . Throat cancer Brother    Social History   Social History  . Marital status: Widowed    Spouse name: N/A  . Number of children: 3  . Years of education: N/A   Social History Main Topics  . Smoking status: Never Smoker  . Smokeless tobacco: Never Used  . Alcohol use No  . Drug use: No  . Sexual activity: Not Asked   Other Topics Concern  . None   Social History Narrative  . None    Outpatient Encounter Prescriptions as of 12/08/2016  Medication Sig  . Ascorbic Acid (VITAMIN C) 100 MG tablet Take 100 mg by mouth daily.  Marland Kitchen BIOTIN PO Take by mouth 2 (two) times daily.  Marland Kitchen diltiazem (CARDIZEM) 30 MG tablet Take 1 tablet (30 mg total) by mouth 4 (four) times daily as needed.  Marland Kitchen estradiol (ESTRACE) 1 MG tablet Take 1 mg by mouth daily.  . fluticasone (FLONASE) 50 MCG/ACT nasal spray  instill 2 spray into each nostril once daily  . montelukast (SINGULAIR) 10 MG tablet 10 mg at bedtime.   . Multiple Vitamin (MULTIVITAMIN) tablet Take 1 tablet by mouth daily.  . pantoprazole (PROTONIX) 40 MG tablet Take 1 tablet (40 mg total) by mouth daily.  . Probiotic Product (PROBIOTIC ADVANCED PO) Take by mouth.  . progesterone 200 MG SUPP Place 200 mg vaginally at bedtime.  . propranolol (INDERAL) 20 MG tablet Take 1 tablet (20 mg total) by mouth 3 (three) times daily as needed.  Marland Kitchen scopolamine (TRANSDERM-SCOP, 1.5 MG,) 1 MG/3DAYS Place 1 patch (1.5 mg total) onto the skin every 3 (three) days.  . traZODone (DESYREL) 50 MG tablet take 1/2 to 1 tablets by mouth at bedtime if needed for sleep   No facility-administered encounter medications on file as of 12/08/2016.     Review of Systems  Constitutional: Negative for fatigue and unexpected weight change.  HENT: Negative for congestion and sinus pressure.   Eyes: Negative for pain and visual disturbance.  Respiratory: Negative for cough, chest tightness and shortness of breath.   Cardiovascular: Negative for  chest pain, palpitations and leg swelling.  Gastrointestinal: Negative for abdominal pain, diarrhea, nausea and vomiting.  Genitourinary: Negative for difficulty urinating and dysuria.  Musculoskeletal: Negative for back pain and joint swelling.  Skin: Negative for color change and rash.  Neurological: Negative for dizziness, light-headedness and headaches.  Hematological: Negative for adenopathy. Does not bruise/bleed easily.  Psychiatric/Behavioral: Negative for agitation and dysphoric mood.       Objective:    Physical Exam  Constitutional: She is oriented to person, place, and time. She appears well-developed and well-nourished. No distress.  HENT:  Nose: Nose normal.  Mouth/Throat: Oropharynx is clear and moist.  Eyes: Right eye exhibits no discharge. Left eye exhibits no discharge. No scleral icterus.  Neck: Neck  supple. No thyromegaly present.  Cardiovascular: Normal rate and regular rhythm.   Pulmonary/Chest: Breath sounds normal. No accessory muscle usage. No tachypnea. No respiratory distress. She has no decreased breath sounds. She has no wheezes. She has no rhonchi. Right breast exhibits no inverted nipple, no mass, no nipple discharge and no tenderness (no axillary adenopathy). Left breast exhibits no inverted nipple, no mass, no nipple discharge and no tenderness (no axilarry adenopathy).  Abdominal: Soft. Bowel sounds are normal. There is no tenderness.  Musculoskeletal: She exhibits no edema or tenderness.  Lymphadenopathy:    She has no cervical adenopathy.  Neurological: She is alert and oriented to person, place, and time.  Skin: Skin is warm. No rash noted. No erythema.  Psychiatric: She has a normal mood and affect. Her behavior is normal.    BP 124/64 (BP Location: Left Arm, Patient Position: Sitting, Cuff Size: Normal)   Pulse 68   Temp 98.6 F (37 C) (Oral)   Resp 12   Ht 5' 6.53" (1.69 m)   Wt 147 lb (66.7 kg)   LMP 07/15/2012   SpO2 98%   BMI 23.35 kg/m  Wt Readings from Last 3 Encounters:  12/08/16 147 lb (66.7 kg)  10/18/16 148 lb (67.1 kg)  08/11/15 147 lb 8 oz (66.9 kg)     Lab Results  Component Value Date   WBC 7.4 05/25/2015   HGB 14.1 05/25/2015   HCT 41.8 05/25/2015   PLT 207.0 05/25/2015   GLUCOSE 96 05/25/2015   CHOL 186 05/25/2015   TRIG 117.0 05/25/2015   HDL 42.90 05/25/2015   LDLCALC 119 (H) 05/25/2015   ALT 13 05/25/2015   AST 16 05/25/2015   NA 139 05/25/2015   K 4.1 05/25/2015   CL 103 05/25/2015   CREATININE 0.72 05/25/2015   BUN 12 05/25/2015   CO2 28 05/25/2015   TSH 1.53 05/25/2015       Assessment & Plan:   Problem List Items Addressed This Visit    GERD (gastroesophageal reflux disease)    On protonix.  Followed by GI.  Stable.       Health care maintenance    Physical today 12/08/16.  PAP 08/11/15 negative with negative  HPV.  Mammogram 12/15/15 - Birads I.  Schedule f/u mammogram. Colonoscopy 05/19/12 - recommended f/u in five years.        Hemorrhoids    Evaluated by surgery.  Planning for procedure 01/2017.       Paroxysmal supraventricular tachycardia (HCC)    Previously evaluated by Dr Rockey Situ.  Has propranolol to take prn.  Follow.       Relevant Orders   CBC with Differential/Platelet   Comprehensive metabolic panel   TSH   Stress    Increased  stress.  Discussed with her today.  She does not feel needs any further intervention.  Follow.         Other Visit Diagnoses    Routine general medical examination at a health care facility    -  Primary   Screening for breast cancer       Relevant Orders   MM DIGITAL SCREENING BILATERAL   Screening cholesterol level       Relevant Orders   Lipid panel       Einar Pheasant, MD

## 2016-12-08 NOTE — Progress Notes (Signed)
Pre-visit discussion using our clinic review tool. No additional management support is needed unless otherwise documented below in the visit note.  

## 2016-12-10 ENCOUNTER — Encounter: Payer: Self-pay | Admitting: Internal Medicine

## 2016-12-10 DIAGNOSIS — K649 Unspecified hemorrhoids: Secondary | ICD-10-CM | POA: Insufficient documentation

## 2016-12-10 NOTE — Assessment & Plan Note (Signed)
Previously evaluated by Dr Rockey Situ.  Has propranolol to take prn.  Follow.

## 2016-12-10 NOTE — Assessment & Plan Note (Signed)
On protonix.  Followed by GI.  Stable.

## 2016-12-10 NOTE — Assessment & Plan Note (Signed)
Increased stress.  Discussed with her today. She does not feel needs any further intervention.  Follow.   

## 2016-12-10 NOTE — Assessment & Plan Note (Signed)
Evaluated by surgery.  Planning for procedure 01/2017.

## 2016-12-10 NOTE — Assessment & Plan Note (Signed)
Physical today 12/08/16.  PAP 08/11/15 negative with negative HPV.  Mammogram 12/15/15 - Birads I.  Schedule f/u mammogram. Colonoscopy 05/19/12 - recommended f/u in five years.

## 2016-12-12 ENCOUNTER — Encounter: Payer: Self-pay | Admitting: Internal Medicine

## 2016-12-14 ENCOUNTER — Encounter: Payer: BLUE CROSS/BLUE SHIELD | Admitting: Internal Medicine

## 2016-12-19 ENCOUNTER — Ambulatory Visit
Admission: RE | Admit: 2016-12-19 | Discharge: 2016-12-19 | Disposition: A | Discharge status: Home / Self Care | Payer: BLUE CROSS/BLUE SHIELD | Source: Ambulatory Visit | Attending: Internal Medicine | Admitting: Internal Medicine

## 2016-12-19 ENCOUNTER — Encounter: Payer: Self-pay | Admitting: General Surgery

## 2016-12-19 DIAGNOSIS — Z1231 Encounter for screening mammogram for malignant neoplasm of breast: Secondary | ICD-10-CM | POA: Diagnosis not present

## 2016-12-19 DIAGNOSIS — Z1239 Encounter for other screening for malignant neoplasm of breast: Secondary | ICD-10-CM

## 2016-12-27 ENCOUNTER — Other Ambulatory Visit (INDEPENDENT_AMBULATORY_CARE_PROVIDER_SITE_OTHER): Payer: BLUE CROSS/BLUE SHIELD

## 2016-12-27 DIAGNOSIS — Z13220 Encounter for screening for lipoid disorders: Secondary | ICD-10-CM | POA: Diagnosis not present

## 2016-12-27 DIAGNOSIS — I471 Supraventricular tachycardia: Secondary | ICD-10-CM | POA: Diagnosis not present

## 2016-12-27 LAB — COMPREHENSIVE METABOLIC PANEL
ALT: 13 U/L (ref 0–35)
AST: 13 U/L (ref 0–37)
Albumin: 4.1 g/dL (ref 3.5–5.2)
Alkaline Phosphatase: 77 U/L (ref 39–117)
BUN: 10 mg/dL (ref 6–23)
CO2: 29 mEq/L (ref 19–32)
Calcium: 9.5 mg/dL (ref 8.4–10.5)
Chloride: 103 mEq/L (ref 96–112)
Creatinine, Ser: 0.7 mg/dL (ref 0.40–1.20)
GFR: 90.75 mL/min (ref >60.00)
Glucose, Bld: 99 mg/dL (ref 70–99)
Potassium: 4 mEq/L (ref 3.5–5.1)
Sodium: 140 mEq/L (ref 135–145)
Total Bilirubin: 0.4 mg/dL (ref 0.2–1.2)
Total Protein: 6.7 g/dL (ref 6.0–8.3)

## 2016-12-27 LAB — CBC WITH DIFFERENTIAL/PLATELET
Basophils Absolute: 0.1 10*3/uL (ref 0.0–0.1)
Basophils Relative: 0.7 % (ref 0.0–3.0)
Eosinophils Absolute: 0.4 10*3/uL (ref 0.0–0.7)
Eosinophils Relative: 5.4 % — ABNORMAL HIGH (ref 0.0–5.0)
HCT: 42 % (ref 36.0–46.0)
Hemoglobin: 13.9 g/dL (ref 12.0–15.0)
Lymphocytes Relative: 27 % (ref 12.0–46.0)
Lymphs Abs: 1.9 10*3/uL (ref 0.7–4.0)
MCHC: 33.2 g/dL (ref 30.0–36.0)
MCV: 95.8 fl (ref 78.0–100.0)
Monocytes Absolute: 0.6 10*3/uL (ref 0.1–1.0)
Monocytes Relative: 8.1 % (ref 3.0–12.0)
Neutro Abs: 4.2 10*3/uL (ref 1.4–7.7)
Neutrophils Relative %: 58.8 % (ref 43.0–77.0)
Platelets: 229 10*3/uL (ref 150.0–400.0)
RBC: 4.38 Mil/uL (ref 3.87–5.11)
RDW: 12.2 % (ref 11.5–15.5)
WBC: 7.1 10*3/uL (ref 4.0–10.5)

## 2016-12-27 LAB — LIPID PANEL
Cholesterol: 188 mg/dL (ref 0–200)
HDL: 42.8 mg/dL (ref >39.00)
LDL Cholesterol: 128 mg/dL — ABNORMAL HIGH (ref 0–99)
NonHDL: 145
Total CHOL/HDL Ratio: 4
Triglycerides: 86 mg/dL (ref 0.0–149.0)
VLDL: 17.2 mg/dL (ref 0.0–40.0)

## 2016-12-27 LAB — TSH: TSH: 2.17 u[IU]/mL (ref 0.35–4.50)

## 2017-01-01 ENCOUNTER — Encounter: Payer: Self-pay | Admitting: Internal Medicine

## 2017-01-01 DIAGNOSIS — M25552 Pain in left hip: Secondary | ICD-10-CM

## 2017-01-01 DIAGNOSIS — M25562 Pain in left knee: Secondary | ICD-10-CM

## 2017-01-03 NOTE — Telephone Encounter (Signed)
Order placed for ortho referral.   

## 2017-01-04 NOTE — Telephone Encounter (Signed)
Hard copy mailed  

## 2017-01-08 NOTE — Telephone Encounter (Signed)
See attached regarding ortho referral.

## 2017-01-17 DIAGNOSIS — M25552 Pain in left hip: Secondary | ICD-10-CM | POA: Diagnosis not present

## 2017-01-24 ENCOUNTER — Encounter: Payer: Self-pay | Admitting: Internal Medicine

## 2017-01-26 DIAGNOSIS — M6281 Muscle weakness (generalized): Secondary | ICD-10-CM | POA: Diagnosis not present

## 2017-01-26 DIAGNOSIS — M25552 Pain in left hip: Secondary | ICD-10-CM | POA: Diagnosis not present

## 2017-02-01 DIAGNOSIS — M25552 Pain in left hip: Secondary | ICD-10-CM | POA: Diagnosis not present

## 2017-02-07 DIAGNOSIS — M6281 Muscle weakness (generalized): Secondary | ICD-10-CM | POA: Diagnosis not present

## 2017-02-07 DIAGNOSIS — M25552 Pain in left hip: Secondary | ICD-10-CM | POA: Diagnosis not present

## 2017-02-14 DIAGNOSIS — M25552 Pain in left hip: Secondary | ICD-10-CM | POA: Diagnosis not present

## 2017-02-14 DIAGNOSIS — M6281 Muscle weakness (generalized): Secondary | ICD-10-CM | POA: Diagnosis not present

## 2017-02-21 DIAGNOSIS — M6281 Muscle weakness (generalized): Secondary | ICD-10-CM | POA: Diagnosis not present

## 2017-02-21 DIAGNOSIS — M25552 Pain in left hip: Secondary | ICD-10-CM | POA: Diagnosis not present

## 2017-02-28 ENCOUNTER — Ambulatory Visit: Payer: BLUE CROSS/BLUE SHIELD | Admitting: General Surgery

## 2017-02-28 ENCOUNTER — Encounter: Payer: Self-pay | Admitting: General Surgery

## 2017-02-28 VITALS — BP 122/76 | HR 79 | Resp 12 | Ht 66.7 in | Wt 146.0 lb

## 2017-02-28 DIAGNOSIS — K644 Residual hemorrhoidal skin tags: Secondary | ICD-10-CM | POA: Diagnosis not present

## 2017-02-28 NOTE — Progress Notes (Signed)
Patient ID: Theresa Buck, female   DOB: 1955-09-24, 61 y.o.   MRN: [id number]  Chief Complaint  Patient presents with  . Pre-op Exam    HPI Theresa Buck is a 61 y.o. female.  Here for pre op hemorrhoidectomy scheduled for 03-13-17.  HPI  Past Medical History:  Diagnosis Date  . Allergy    recurring sinus problems  . Deviated septum   . Deviated septum   . H/O sinusitis   . Hx of degenerative disc disease     Past Surgical History:  Procedure Laterality Date  . BACK SURGERY  09/26/2013  . COLONOSCOPY  08/03/2015   Dr Vira Agar  . UPPER GI ENDOSCOPY  08/03/2015   Dr Vira Agar    Family History  Problem Relation Age of Onset  . Brain cancer Mother   . ALS Father   . Diabetes Brother        multiple  . Heart disease Son        myocardial infarction, s/p CABG  . Throat cancer Brother     Social History Social History   Tobacco Use  . Smoking status: Never Smoker  . Smokeless tobacco: Never Used  Substance Use Topics  . Alcohol use: No    Alcohol/week: 0.0 oz  . Drug use: No    Allergies  Allergen Reactions  . Prednisone Other (See Comments)    Gi upset  . Ceftin [Cefuroxime Axetil] Rash    Current Outpatient Medications  Medication Sig Dispense Refill  . Artificial Tear Solution (GENTEAL TEARS) 0.1-0.2-0.3 % SOLN Place 1 drop 3 times/day as needed-between meals & bedtime into both eyes (dry eyes).    . Biotin 5000 MCG TABS Take 1 tablet daily by mouth.     . Calcium Carb-Cholecalciferol (CALCIUM + D3 PO) Take 1 tablet daily by mouth.    . diltiazem (CARDIZEM) 30 MG tablet Take 1 tablet (30 mg total) by mouth 4 (four) times daily as needed. 90 tablet 6  . estradiol (ESTRACE) 0.1 MG/GM vaginal cream Place 1 Applicatorful at bedtime vaginally.    Marland Kitchen estradiol (ESTRACE) 1 MG tablet Take 0.5 mg daily by mouth.     . fluticasone (FLONASE) 50 MCG/ACT nasal spray instill 2 spray into each nostril once daily 16 g 5  . montelukast (SINGULAIR) 10 MG  tablet Take 10 mg at bedtime by mouth.   1  . Multiple Vitamin (MULTIVITAMIN) tablet Take 1 tablet by mouth daily.    Marland Kitchen OVER THE COUNTER MEDICATION Take 1 capsule 2 (two) times daily by mouth. XEO Omega    . OVER THE COUNTER MEDICATION Take 1 capsule 2 (two) times daily by mouth. Micro Plex VM    . OVER THE COUNTER MEDICATION Take 1 capsule 2 (two) times daily by mouth. Alpha CRS    . pantoprazole (PROTONIX) 40 MG tablet Take 1 tablet (40 mg total) by mouth daily. 30 tablet 5  . Probiotic Product (PROBIOTIC ADVANCED PO) Take 1 capsule daily by mouth.     . progesterone (PROMETRIUM) 100 MG capsule Take 100 mg daily by mouth.  0  . propranolol (INDERAL) 20 MG tablet Take 1 tablet (20 mg total) by mouth 3 (three) times daily as needed. 90 tablet 1  . traZODone (DESYREL) 50 MG tablet take 1/2 to 1 tablets by mouth at bedtime if needed for sleep 30 tablet 3   No current facility-administered medications for this visit.     Review of Systems Review of Systems  Constitutional:  Negative.   Respiratory: Negative.   Cardiovascular: Negative.     Blood pressure 122/76, pulse 79, resp. rate 12, height 5' 6.7" (1.694 m), weight 146 lb (66.2 kg), last menstrual period 07/15/2012.  Physical Exam Physical Exam  Constitutional: She is oriented to person, place, and time. She appears well-developed and well-nourished.  HENT:  Mouth/Throat: Oropharynx is clear and moist.  Eyes: Conjunctivae are normal. No scleral icterus.  Neck: Neck supple.  Cardiovascular: Normal rate, regular rhythm and normal heart sounds.  Pulmonary/Chest: Effort normal and breath sounds normal.  Abdominal: Soft. Bowel sounds are normal. There is no tenderness.  Genitourinary: Rectal exam shows external hemorrhoid.  Genitourinary Comments: Inflamed 4-5 o'clock external hemorrhoid, and a smaller one at 11 o'clock   Lymphadenopathy:    She has no cervical adenopathy.  Neurological: She is alert and oriented to person, place, and  time.  Skin: Skin is warm and dry.  Psychiatric: Her behavior is normal.    Data Reviewed Prior notes  Assessment    External hemorrhoids    Plan    Surgery as scheduled. OTC hemorrhoid cream until surgery      HPI, Physical Exam, Assessment and Plan have been scribed under the direction and in the presence of Mckinley Jewel, MD Karie Fetch, RN I have completed the exam and reviewed the above documentation for accuracy and completeness.  I agree with the above.  Haematologist has been used and any errors in dictation or transcription are unintentional.  Jazalynn Mireles G. Jamal Collin, M.D., F.A.C.S.  Junie Panning G 03/02/2017, 6:29 PM

## 2017-02-28 NOTE — H&P (View-Only) (Signed)
Patient ID: Theresa Buck, female   DOB: Aug 24, 1955, 61 y.o.   MRN: [id number]  Chief Complaint  Patient presents with  . Pre-op Exam    HPI Theresa Buck is a 61 y.o. female.  Here for pre op hemorrhoidectomy scheduled for 03-13-17.  HPI  Past Medical History:  Diagnosis Date  . Allergy    recurring sinus problems  . Deviated septum   . Deviated septum   . H/O sinusitis   . Hx of degenerative disc disease     Past Surgical History:  Procedure Laterality Date  . BACK SURGERY  09/26/2013  . COLONOSCOPY  08/03/2015   Dr Vira Agar  . UPPER GI ENDOSCOPY  08/03/2015   Dr Vira Agar    Family History  Problem Relation Age of Onset  . Brain cancer Mother   . ALS Father   . Diabetes Brother        multiple  . Heart disease Son        myocardial infarction, s/p CABG  . Throat cancer Brother     Social History Social History   Tobacco Use  . Smoking status: Never Smoker  . Smokeless tobacco: Never Used  Substance Use Topics  . Alcohol use: No    Alcohol/week: 0.0 oz  . Drug use: No    Allergies  Allergen Reactions  . Prednisone Other (See Comments)    Gi upset  . Ceftin [Cefuroxime Axetil] Rash    Current Outpatient Medications  Medication Sig Dispense Refill  . Artificial Tear Solution (GENTEAL TEARS) 0.1-0.2-0.3 % SOLN Place 1 drop 3 times/day as needed-between meals & bedtime into both eyes (dry eyes).    . Biotin 5000 MCG TABS Take 1 tablet daily by mouth.     . Calcium Carb-Cholecalciferol (CALCIUM + D3 PO) Take 1 tablet daily by mouth.    . diltiazem (CARDIZEM) 30 MG tablet Take 1 tablet (30 mg total) by mouth 4 (four) times daily as needed. 90 tablet 6  . estradiol (ESTRACE) 0.1 MG/GM vaginal cream Place 1 Applicatorful at bedtime vaginally.    Marland Kitchen estradiol (ESTRACE) 1 MG tablet Take 0.5 mg daily by mouth.     . fluticasone (FLONASE) 50 MCG/ACT nasal spray instill 2 spray into each nostril once daily 16 g 5  . montelukast (SINGULAIR) 10 MG  tablet Take 10 mg at bedtime by mouth.   1  . Multiple Vitamin (MULTIVITAMIN) tablet Take 1 tablet by mouth daily.    Marland Kitchen OVER THE COUNTER MEDICATION Take 1 capsule 2 (two) times daily by mouth. XEO Omega    . OVER THE COUNTER MEDICATION Take 1 capsule 2 (two) times daily by mouth. Micro Plex VM    . OVER THE COUNTER MEDICATION Take 1 capsule 2 (two) times daily by mouth. Alpha CRS    . pantoprazole (PROTONIX) 40 MG tablet Take 1 tablet (40 mg total) by mouth daily. 30 tablet 5  . Probiotic Product (PROBIOTIC ADVANCED PO) Take 1 capsule daily by mouth.     . progesterone (PROMETRIUM) 100 MG capsule Take 100 mg daily by mouth.  0  . propranolol (INDERAL) 20 MG tablet Take 1 tablet (20 mg total) by mouth 3 (three) times daily as needed. 90 tablet 1  . traZODone (DESYREL) 50 MG tablet take 1/2 to 1 tablets by mouth at bedtime if needed for sleep 30 tablet 3   No current facility-administered medications for this visit.     Review of Systems Review of Systems  Constitutional:  Negative.   Respiratory: Negative.   Cardiovascular: Negative.     Blood pressure 122/76, pulse 79, resp. rate 12, height 5' 6.7" (1.694 m), weight 146 lb (66.2 kg), last menstrual period 07/15/2012.  Physical Exam Physical Exam  Constitutional: She is oriented to person, place, and time. She appears well-developed and well-nourished.  HENT:  Mouth/Throat: Oropharynx is clear and moist.  Eyes: Conjunctivae are normal. No scleral icterus.  Neck: Neck supple.  Cardiovascular: Normal rate, regular rhythm and normal heart sounds.  Pulmonary/Chest: Effort normal and breath sounds normal.  Abdominal: Soft. Bowel sounds are normal. There is no tenderness.  Genitourinary: Rectal exam shows external hemorrhoid.  Genitourinary Comments: Inflamed 4-5 o'clock external hemorrhoid, and a smaller one at 11 o'clock   Lymphadenopathy:    She has no cervical adenopathy.  Neurological: She is alert and oriented to person, place, and  time.  Skin: Skin is warm and dry.  Psychiatric: Her behavior is normal.    Data Reviewed Prior notes  Assessment    External hemorrhoids    Plan    Surgery as scheduled. OTC hemorrhoid cream until surgery      HPI, Physical Exam, Assessment and Plan have been scribed under the direction and in the presence of Mckinley Jewel, MD Karie Fetch, RN I have completed the exam and reviewed the above documentation for accuracy and completeness.  I agree with the above.  Haematologist has been used and any errors in dictation or transcription are unintentional.  Chrys Landgrebe G. Jamal Collin, M.D., F.A.C.S.  Junie Panning G 03/02/2017, 6:29 PM

## 2017-02-28 NOTE — Patient Instructions (Signed)
The patient is aware to call back for any questions or concerns.  Hemorrhoids Hemorrhoids are swollen veins in and around the rectum or anus. Hemorrhoids can cause pain, itching, or bleeding. Most of the time, they do not cause serious problems. They usually get better with diet changes, lifestyle changes, and other home treatments. Follow these instructions at home: Eating and drinking  Eat foods that have fiber, such as whole grains, beans, nuts, fruits, and vegetables. Ask your doctor about taking products that have added fiber (fibersupplements).  Drink enough fluid to keep your pee (urine) clear or pale yellow. For Pain and Swelling  Take a warm-water bath (sitz bath) for 20 minutes to ease pain. Do this 3-4 times a day.  If directed, put ice on the painful area. It may be helpful to use ice between your warm baths. ? Put ice in a plastic bag. ? Place a towel between your skin and the bag. ? Leave the ice on for 20 minutes, 2-3 times a day. General instructions  Take over-the-counter and prescription medicines only as told by your doctor. ? Medicated creams and medicines that are inserted into the anus (suppositories) may be used or applied as told.  Exercise often.  Go to the bathroom when you have the urge to poop (to have a bowel movement). Do not wait.  Avoid pushing too hard (straining) when you poop.  Keep the butt area dry and clean. Use wet toilet paper or moist paper towels.  Do not sit on the toilet for a long time. Contact a doctor if:  You have any of these: ? Pain and swelling that do not get better with treatment or medicine. ? Bleeding that will not stop. ? Trouble pooping or you cannot poop. ? Pain or swelling outside the area of the hemorrhoids. This information is not intended to replace advice given to you by your health care provider. Make sure you discuss any questions you have with your health care provider. Document Released: 01/18/2008 Document  Revised: 09/16/2015 Document Reviewed: 12/23/2014 Elsevier Interactive Patient Education  [employer].

## 2017-03-05 ENCOUNTER — Encounter
Admission: RE | Admit: 2017-03-05 | Discharge: 2017-03-05 | Disposition: A | Discharge status: Home / Self Care | Payer: BLUE CROSS/BLUE SHIELD | Source: Ambulatory Visit | Attending: General Surgery | Admitting: General Surgery

## 2017-03-05 ENCOUNTER — Other Ambulatory Visit: Payer: Self-pay

## 2017-03-05 HISTORY — DX: Supraventricular tachycardia, unspecified: I47.10

## 2017-03-05 HISTORY — DX: Anxiety disorder, unspecified: F41.9

## 2017-03-05 HISTORY — DX: Supraventricular tachycardia: I47.1

## 2017-03-05 HISTORY — DX: Headache, unspecified: R51.9

## 2017-03-05 HISTORY — DX: Unspecified osteoarthritis, unspecified site: M19.90

## 2017-03-05 HISTORY — DX: Headache: R51

## 2017-03-05 HISTORY — DX: Gastro-esophageal reflux disease without esophagitis: K21.9

## 2017-03-05 NOTE — Patient Instructions (Signed)
Your procedure is scheduled on: 03-13-17 Report to Same Day Surgery 2nd floor medical mall [facility] Entrance-take elevator on left to 2nd floor.  Check in with surgery information desk.) To find out your arrival time please call [phone] between 1PM - 3PM on 03-12-17  Remember: Instructions that are not followed completely may result in serious medical risk, up to and including death, or upon the discretion of your surgeon and anesthesiologist your surgery may need to be rescheduled.    _x___ 1. Do not eat food after midnight the night before your procedure. NO GUM CHEWING OR CANDY AFTER MIDNIGHT.  You may drink clear liquids up to 2 hours before you are scheduled to arrive at the hospital for your procedure.  Do not drink clear liquids within 2 hours of your scheduled arrival to the hospital.  Clear liquids include  --Water or Apple juice without pulp  --Clear carbohydrate beverage such as ClearFast or Gatorade  --Black Coffee or Clear Tea (No milk, no creamers, do not add anything to the coffee or Tea)      __x__ 2. No Alcohol for 24 hours before or after surgery.   __x__3. No Smoking for 24 prior to surgery.   ____  4. Bring all medications with you on the day of surgery if instructed.    __x__ 5. Notify your doctor if there is any change in your medical condition     (cold, fever, infections).     Do not wear jewelry, make-up, hairpins, clips or nail polish.  Do not wear lotions, powders, or perfumes. You may wear deodorant.  Do not shave 48 hours prior to surgery. Men may shave face and neck.  Do not bring valuables to the hospital.    [facility] is not responsible for any belongings or valuables.               Contacts, dentures or bridgework may not be worn into surgery.  Leave your suitcase in the car. After surgery it may be brought to your room.  For patients admitted to the hospital, discharge time is determined by your treatment team.   Patients discharged  the day of surgery will not be allowed to drive home.  You will need someone to drive you home and stay with you the night of your procedure.        _x___ TAKE THE FOLLOWING MEDICATION THE MORNING OF SURGERY WITH A SMALL SIP OF WATER. These include:  1. PROTONIX  2. TAKE AN EXTRA PROTONIX ON Monday NIGHT BEFORE BED  3. BRING YOUR PROPRANOLOL AND DILTIAZEM WITH YOU TO THE HOSPITAL IN CASE ANESTHESIA NEEDS YOU TO TAKE ONE  4.  5.  6.  ____Fleets enema or Magnesium Citrate as directed.   ____ Use CHG Soap or sage wipes as directed on instruction sheet   ____ Use inhalers on the day of surgery and bring to hospital day of surgery  ____ Stop Metformin and Janumet 2 days prior to surgery.    ____ Take 1/2 of usual insulin dose the night before surgery and none on the morning surgery.   ____ Follow recommendations from Cardiologist, Pulmonologist or PCP regarding stopping Aspirin, Coumadin, Plavix ,Eliquis, Effient, or Pradaxa, and Pletal.  X____Stop Anti-inflammatories such as Advil, Aleve, Ibuprofen, Motrin, Naproxen, Naprosyn, Goodies powders or aspirin products NOW-OK to take Tylenol    _x___ Stop supplements until after surgery-STOP BIOTIN, XEO OMEGA, MICROPLEX VM, AND ALPHA CRS NOW-MAY RESUME AFTER SURGERY   ____ [employer]  C-Pap to the hospital.

## 2017-03-06 ENCOUNTER — Inpatient Hospital Stay
Admission: RE | Admit: 2017-03-06 | Discharge: 2017-03-06 | Disposition: A | Discharge status: Home / Self Care | Payer: BLUE CROSS/BLUE SHIELD | Source: Ambulatory Visit

## 2017-03-06 ENCOUNTER — Encounter: Payer: Self-pay | Admitting: General Surgery

## 2017-03-06 NOTE — Pre-Procedure Instructions (Signed)
DR Jamal Collin HAS NOT PUT ORDERS IN Epic-OFFICE STATES HE WILL NOT BE BACK IN UNTIL Monday-  INFORMED CARYL-LYN THAT PT IS COMING IN FOR EKG TOMORROW AND I  WAS UNSURE IF HE WAS GOING TO WANT ANY LABS OR ENEMA PRIOR TO SURGERY- CARYL-LYN SAID SHE WILL GET IN TOUCH WITH PT IF HE DECIDES HE WANTS PT TO HAVE AN ENEMA PRIOR TO HER SURGERY

## 2017-03-07 ENCOUNTER — Encounter
Admission: RE | Admit: 2017-03-07 | Discharge: 2017-03-07 | Disposition: A | Discharge status: Home / Self Care | Payer: BLUE CROSS/BLUE SHIELD | Source: Ambulatory Visit | Attending: General Surgery | Admitting: General Surgery

## 2017-03-07 DIAGNOSIS — I471 Supraventricular tachycardia: Secondary | ICD-10-CM | POA: Insufficient documentation

## 2017-03-07 DIAGNOSIS — M6281 Muscle weakness (generalized): Secondary | ICD-10-CM | POA: Diagnosis not present

## 2017-03-07 DIAGNOSIS — M25552 Pain in left hip: Secondary | ICD-10-CM | POA: Diagnosis not present

## 2017-03-12 ENCOUNTER — Other Ambulatory Visit: Payer: Self-pay | Admitting: General Surgery

## 2017-03-12 ENCOUNTER — Telehealth: Payer: Self-pay

## 2017-03-12 DIAGNOSIS — K648 Other hemorrhoids: Secondary | ICD-10-CM

## 2017-03-12 NOTE — Telephone Encounter (Signed)
Patient notified per Dr Jamal Collin that she will need to make use of a Fleets Enema one hour prior to leaving for her surgery at [facility] on 03/13/17. She is aware of this and aware of the directions.

## 2017-03-13 ENCOUNTER — Ambulatory Visit
Admission: RE | Admit: 2017-03-13 | Discharge: 2017-03-13 | Disposition: A | Discharge status: Home / Self Care | Payer: BLUE CROSS/BLUE SHIELD | Source: Ambulatory Visit | Attending: General Surgery | Admitting: General Surgery

## 2017-03-13 ENCOUNTER — Ambulatory Visit: Payer: BLUE CROSS/BLUE SHIELD | Admitting: Anesthesiology

## 2017-03-13 ENCOUNTER — Encounter
Admission: RE | Disposition: A | Discharge status: Home / Self Care | Payer: Self-pay | Source: Ambulatory Visit | Attending: General Surgery

## 2017-03-13 ENCOUNTER — Other Ambulatory Visit: Payer: Self-pay

## 2017-03-13 DIAGNOSIS — K644 Residual hemorrhoidal skin tags: Secondary | ICD-10-CM | POA: Insufficient documentation

## 2017-03-13 DIAGNOSIS — K219 Gastro-esophageal reflux disease without esophagitis: Secondary | ICD-10-CM | POA: Insufficient documentation

## 2017-03-13 DIAGNOSIS — I471 Supraventricular tachycardia: Secondary | ICD-10-CM | POA: Insufficient documentation

## 2017-03-13 DIAGNOSIS — Z888 Allergy status to other drugs, medicaments and biological substances status: Secondary | ICD-10-CM | POA: Insufficient documentation

## 2017-03-13 DIAGNOSIS — K648 Other hemorrhoids: Secondary | ICD-10-CM

## 2017-03-13 DIAGNOSIS — K649 Unspecified hemorrhoids: Secondary | ICD-10-CM | POA: Diagnosis not present

## 2017-03-13 DIAGNOSIS — F419 Anxiety disorder, unspecified: Secondary | ICD-10-CM | POA: Insufficient documentation

## 2017-03-13 DIAGNOSIS — M1990 Unspecified osteoarthritis, unspecified site: Secondary | ICD-10-CM | POA: Diagnosis not present

## 2017-03-13 DIAGNOSIS — Z79899 Other long term (current) drug therapy: Secondary | ICD-10-CM | POA: Diagnosis not present

## 2017-03-13 HISTORY — PX: HEMORRHOID SURGERY: SHX153

## 2017-03-13 SURGERY — HEMORRHOIDECTOMY
Anesthesia: General | Wound class: Clean Contaminated

## 2017-03-13 MED ORDER — SODIUM CHLORIDE 0.9 % IV SOLN
INTRAVENOUS | Status: DC | PRN
  Administered 2017-03-13: 40 mL

## 2017-03-13 MED ORDER — BACITRACIN ZINC 500 UNIT/GM EX OINT
TOPICAL_OINTMENT | CUTANEOUS | Status: AC
  Filled 2017-03-13: qty 28.35

## 2017-03-13 MED ORDER — SODIUM CHLORIDE 0.9 % IJ SOLN
INTRAMUSCULAR | Status: AC
  Filled 2017-03-13: qty 50

## 2017-03-13 MED ORDER — FENTANYL CITRATE (PF) 250 MCG/5ML IJ SOLN
INTRAMUSCULAR | Status: AC
  Filled 2017-03-13: qty 5

## 2017-03-13 MED ORDER — OXYCODONE-ACETAMINOPHEN 5-325 MG PO TABS: 1.0000 | ORAL_TABLET | ORAL | 0 refills | Status: DC | PRN

## 2017-03-13 MED ORDER — ONDANSETRON HCL 4 MG/2ML IJ SOLN
INTRAMUSCULAR | Status: DC | PRN
  Administered 2017-03-13: 4 mg via INTRAVENOUS

## 2017-03-13 MED ORDER — OXYCODONE HCL 5 MG/5ML PO SOLN: 5.0000 mg | Freq: Once | ORAL | Status: AC | PRN

## 2017-03-13 MED ORDER — MIDAZOLAM HCL 2 MG/2ML IJ SOLN
INTRAMUSCULAR | Status: AC
  Filled 2017-03-13: qty 2

## 2017-03-13 MED ORDER — ONDANSETRON HCL 4 MG/2ML IJ SOLN
INTRAMUSCULAR | Status: AC
  Filled 2017-03-13: qty 2

## 2017-03-13 MED ORDER — FLEET ENEMA 7-19 GM/118ML RE ENEM
1.0000 | ENEMA | Freq: Once | RECTAL | Status: AC
  Administered 2017-03-13: 1 via RECTAL

## 2017-03-13 MED ORDER — MEPERIDINE HCL 50 MG/ML IJ SOLN: 6.2500 mg | INTRAMUSCULAR | Status: DC | PRN

## 2017-03-13 MED ORDER — FENTANYL CITRATE (PF) 100 MCG/2ML IJ SOLN
INTRAMUSCULAR | Status: DC | PRN
  Administered 2017-03-13: 50 ug via INTRAVENOUS

## 2017-03-13 MED ORDER — LACTATED RINGERS IV SOLN
INTRAVENOUS | Status: DC
  Administered 2017-03-13 (×2): via INTRAVENOUS

## 2017-03-13 MED ORDER — OXYCODONE HCL 5 MG PO TABS
ORAL_TABLET | ORAL | Status: AC
  Filled 2017-03-13: qty 1

## 2017-03-13 MED ORDER — SODIUM CHLORIDE FLUSH 0.9 % IV SOLN
INTRAVENOUS | Status: AC
  Filled 2017-03-13: qty 10

## 2017-03-13 MED ORDER — OXYCODONE HCL 5 MG PO TABS
5.0000 mg | ORAL_TABLET | Freq: Once | ORAL | Status: AC | PRN
Start: 2017-03-13 — End: 2017-03-13
  Administered 2017-03-13: 5 mg via ORAL

## 2017-03-13 MED ORDER — BUPIVACAINE HCL (PF) 0.5 % IJ SOLN
INTRAMUSCULAR | Status: AC
  Filled 2017-03-13: qty 30

## 2017-03-13 MED ORDER — ACETAMINOPHEN 10 MG/ML IV SOLN
INTRAVENOUS | Status: DC | PRN
  Administered 2017-03-13: 1000 mg via INTRAVENOUS

## 2017-03-13 MED ORDER — FENTANYL CITRATE (PF) 100 MCG/2ML IJ SOLN: 25.0000 ug | INTRAMUSCULAR | Status: DC | PRN

## 2017-03-13 MED ORDER — DEXAMETHASONE SODIUM PHOSPHATE 10 MG/ML IJ SOLN
INTRAMUSCULAR | Status: DC | PRN
  Administered 2017-03-13: 5 mg via INTRAVENOUS

## 2017-03-13 MED ORDER — KETOROLAC TROMETHAMINE 30 MG/ML IJ SOLN
INTRAMUSCULAR | Status: DC | PRN
  Administered 2017-03-13: 30 mg via INTRAVENOUS

## 2017-03-13 MED ORDER — PROPOFOL 10 MG/ML IV BOLUS
INTRAVENOUS | Status: DC | PRN
Start: 2017-03-13 — End: 2017-03-13
  Administered 2017-03-13: 130 mg via INTRAVENOUS

## 2017-03-13 MED ORDER — BUPIVACAINE LIPOSOME 1.3 % IJ SUSP
INTRAMUSCULAR | Status: AC
  Filled 2017-03-13: qty 20

## 2017-03-13 MED ORDER — ACETAMINOPHEN 10 MG/ML IV SOLN
INTRAVENOUS | Status: AC
  Filled 2017-03-13: qty 100

## 2017-03-13 MED ORDER — PROMETHAZINE HCL 25 MG/ML IJ SOLN: 6.2500 mg | INTRAMUSCULAR | Status: DC | PRN

## 2017-03-13 MED ORDER — MIDAZOLAM HCL 2 MG/2ML IJ SOLN
INTRAMUSCULAR | Status: DC | PRN
  Administered 2017-03-13: 2 mg via INTRAVENOUS

## 2017-03-13 MED ORDER — PROPOFOL 10 MG/ML IV BOLUS
INTRAVENOUS | Status: AC
  Filled 2017-03-13: qty 20

## 2017-03-13 MED ORDER — EPHEDRINE SULFATE 50 MG/ML IJ SOLN
INTRAMUSCULAR | Status: DC | PRN
  Administered 2017-03-13: 10 mg via INTRAVENOUS

## 2017-03-13 SURGICAL SUPPLY — 25 items
BLADE SURG 15 STRL SS SAFETY (BLADE) ×2 IMPLANT
BRIEF STRETCH MATERNITY 2XLG (MISCELLANEOUS) ×2 IMPLANT
CANISTER SUCT 1200ML W/VALVE (MISCELLANEOUS) ×2 IMPLANT
DRAPE LAPAROTOMY 77X122 PED (DRAPES) ×2 IMPLANT
DRAPE LEGGINS SURG 28X43 STRL (DRAPES) ×2 IMPLANT
DRAPE UNDER BUTTOCK W/FLU (DRAPES) ×2 IMPLANT
ELECT REM PT RETURN 9FT ADLT (ELECTROSURGICAL) ×2
ELECTRODE REM PT RTRN 9FT ADLT (ELECTROSURGICAL) ×1 IMPLANT
GLOVE BIO SURGEON STRL SZ7 (GLOVE) ×2 IMPLANT
GOWN STRL REUS W/ TWL LRG LVL3 (GOWN DISPOSABLE) ×2 IMPLANT
GOWN STRL REUS W/TWL LRG LVL3 (GOWN DISPOSABLE) ×2
KIT RM TURNOVER STRD PROC AR (KITS) ×2 IMPLANT
LABEL OR SOLS (LABEL) ×2 IMPLANT
LIGASURE IMPACT 36 18CM CVD LR (INSTRUMENTS) ×2 IMPLANT
NEEDLE HYPO 25X1 1.5 SAFETY (NEEDLE) ×2 IMPLANT
NS IRRIG 500ML POUR BTL (IV SOLUTION) ×2 IMPLANT
PACK BASIN MINOR ARMC (MISCELLANEOUS) ×2 IMPLANT
PAD OB MATERNITY 4.3X12.25 (PERSONAL CARE ITEMS) ×2 IMPLANT
PAD PREP 24X41 OB/GYN DISP (PERSONAL CARE ITEMS) ×2 IMPLANT
SOL PREP PVP 2OZ (MISCELLANEOUS) ×2
SOLUTION PREP PVP 2OZ (MISCELLANEOUS) ×1 IMPLANT
SURGILUBE 2OZ TUBE FLIPTOP (MISCELLANEOUS) ×2 IMPLANT
SUT MNCRL 3-0 UNDYED SH (SUTURE) ×1 IMPLANT
SUT MONOCRYL 3-0 UNDYED (SUTURE) ×1
SYR CONTROL 10ML (SYRINGE) ×2 IMPLANT

## 2017-03-13 NOTE — Transfer of Care (Signed)
Immediate Anesthesia Transfer of Care Note  Patient: Theresa Buck  Procedure(s) Performed: HEMORRHOIDECTOMY (N/A )  Patient Location: PACU  Anesthesia Type:General  Level of Consciousness: sedated  Airway & Oxygen Therapy: Patient Spontanous Breathing and Patient connected to face mask oxygen  Post-op Assessment: Report given to RN and Post -op Vital signs reviewed and stable  Post vital signs: Reviewed and stable  Last Vitals:  Vitals:   03/13/17 0822 03/13/17 0837  BP: (!) 142/81 102/86  Pulse: 87 67  Resp: (!) 9 11  Temp: 36.6 C   SpO2: 100% 100%    Last Pain:  Vitals:   03/13/17 0606  TempSrc: Temporal         Complications: No apparent anesthesia complications

## 2017-03-13 NOTE — Anesthesia Post-op Follow-up Note (Signed)
Anesthesia QCDR form completed.        

## 2017-03-13 NOTE — Op Note (Signed)
Preop diagnosis: External hemorrhoids  Post op diagnosis: Same  Operation: External hemorrhoidectomy 2 columns  Surgeon: Mckinley Jewel  Assistant:     Anesthesia: General  Complications: None  EBL: Less than 5 mL  Drains: None  Description: The patient was put to sleep with an LMA and placed in the lithotomy position.  Anal area was prepped with Betadine and draped out.  Timeout was performed.  Digital and speculum examination of the anorectal region was completed.  The patient had 2 sizable external hemorrhoid  located at the 5:00 and 11:00.  Each hemorrhoid was then incised at its base and with cautery completely removed.  After bleeding points were cauterized.  Both incisions were closed with subcuticular 3-0 Monocryl.  20 mL of Exparel mixed with 20 mL of saline was then infiltrated in the perianal region for postop analgesia.  The area was then cleansed and covered with bacitracin ointment and dressed.  Patient subsequently was taken out of lithotomy and extubated and returned to recovery room in stable condition.

## 2017-03-13 NOTE — Anesthesia Postprocedure Evaluation (Signed)
Anesthesia Post Note  Patient: Theresa Buck  Procedure(s) Performed: HEMORRHOIDECTOMY (N/A )  Patient location during evaluation: PACU Anesthesia Type: General Level of consciousness: awake and alert and oriented Pain management: pain level controlled Vital Signs Assessment: post-procedure vital signs reviewed and stable Respiratory status: spontaneous breathing, nonlabored ventilation and respiratory function stable Cardiovascular status: blood pressure returned to baseline and stable Postop Assessment: no signs of nausea or vomiting Anesthetic complications: no     Last Vitals:  Vitals:   03/13/17 0837 03/13/17 0852  BP: 102/86 131/82  Pulse: 67 71  Resp: 11 19  Temp:  36.8 C  SpO2: 100% 100%    Last Pain:  Vitals:   03/13/17 0606  TempSrc: Temporal                 Dimond Crotty

## 2017-03-13 NOTE — Anesthesia Preprocedure Evaluation (Signed)
Anesthesia Evaluation  Patient identified by MRN, date of birth, ID band Patient awake    Reviewed: Allergy & Precautions, NPO status , Patient's Chart, lab work & pertinent test results  History of Anesthesia Complications Negative for: history of anesthetic complications  Airway Mallampati: III  TM Distance: >3 FB Neck ROM: Full    Dental  (+) Implants Crowns:   Pulmonary neg pulmonary ROS, neg sleep apnea, neg COPD,    breath sounds clear to auscultation- rhonchi (-) wheezing      Cardiovascular Exercise Tolerance: Good (-) hypertension(-) CAD, (-) Past MI and (-) Cardiac Stents  Rhythm:Regular Rate:Normal - Systolic murmurs and - Diastolic murmurs    Neuro/Psych  Headaches, Anxiety    GI/Hepatic Neg liver ROS, GERD  ,  Endo/Other  negative endocrine ROSneg diabetes  Renal/GU negative Renal ROS     Musculoskeletal  (+) Arthritis ,   Abdominal (+) - obese,   Peds  Hematology negative hematology ROS (+)   Anesthesia Other Findings Past Medical History: No date: Allergy     Comment:  recurring sinus problems No date: Anxiety No date: Arthritis No date: Deviated septum No date: Deviated septum No date: GERD (gastroesophageal reflux disease) No date: H/O sinusitis No date: Headache     Comment:  H/O MIGRAINES No date: Hx of degenerative disc disease No date: SVT (supraventricular tachycardia) (HCC)   Reproductive/Obstetrics                             Anesthesia Physical Anesthesia Plan  ASA: II  Anesthesia Plan: General   Post-op Pain Management:    Induction: Intravenous  PONV Risk Score and Plan: 2 and Dexamethasone and Ondansetron  Airway Management Planned: LMA  Additional Equipment:   Intra-op Plan:   Post-operative Plan:   Informed Consent: I have reviewed the patients History and Physical, chart, labs and discussed the procedure including the risks, benefits  and alternatives for the proposed anesthesia with the patient or authorized representative who has indicated his/her understanding and acceptance.   Dental advisory given  Plan Discussed with: CRNA and Anesthesiologist  Anesthesia Plan Comments:         Anesthesia Quick Evaluation

## 2017-03-13 NOTE — Interval H&P Note (Signed)
History and Physical Interval Note:  03/13/2017 7:12 AM  Theresa Buck  has presented today for surgery, with the diagnosis of EXTERNAL HEMORRHOIDS  The various methods of treatment have been discussed with the patient and family. After consideration of risks, benefits and other options for treatment, the patient has consented to  Procedure(s): HEMORRHOIDECTOMY (N/A) as a surgical intervention .  The patient's history has been reviewed, patient examined, no change in status, stable for surgery.  I have reviewed the patient's chart and labs.  Questions were answered to the patient's satisfaction.     [name]

## 2017-03-13 NOTE — Discharge Instructions (Signed)

## 2017-03-13 NOTE — Anesthesia Procedure Notes (Signed)
Procedure Name: LMA Insertion Date/Time: 03/13/2017 7:27 AM Performed by: Justus Memory, CRNA Pre-anesthesia Checklist: Patient identified, Patient being monitored, Timeout performed, Emergency Drugs available and Suction available Patient Re-evaluated:Patient Re-evaluated prior to induction Oxygen Delivery Method: Circle system utilized Preoxygenation: Pre-oxygenation with 100% oxygen Induction Type: IV induction Ventilation: Mask ventilation without difficulty LMA: LMA inserted LMA Size: 3.5 Tube type: Oral Number of attempts: 1 Placement Confirmation: positive ETCO2 and breath sounds checked- equal and bilateral Tube secured with: Tape Dental Injury: Teeth and Oropharynx as per pre-operative assessment

## 2017-03-14 LAB — SURGICAL PATHOLOGY

## 2017-03-14 MED ORDER — FENTANYL CITRATE (PF) 100 MCG/2ML IJ SOLN
INTRAMUSCULAR | Status: AC
  Filled 2017-03-14: qty 2

## 2017-03-14 MED ORDER — MORPHINE SULFATE (PF) 0.5 MG/ML IJ SOLN
INTRAMUSCULAR | Status: AC
  Filled 2017-03-14: qty 10

## 2017-03-20 ENCOUNTER — Ambulatory Visit: Payer: BLUE CROSS/BLUE SHIELD | Admitting: General Surgery

## 2017-03-20 ENCOUNTER — Encounter: Payer: Self-pay | Admitting: General Surgery

## 2017-03-20 ENCOUNTER — Ambulatory Visit (INDEPENDENT_AMBULATORY_CARE_PROVIDER_SITE_OTHER): Payer: BLUE CROSS/BLUE SHIELD | Admitting: General Surgery

## 2017-03-20 VITALS — BP 118/70 | HR 76 | Resp 12 | Ht 64.0 in | Wt 146.0 lb

## 2017-03-20 DIAGNOSIS — K644 Residual hemorrhoidal skin tags: Secondary | ICD-10-CM

## 2017-03-20 NOTE — Progress Notes (Signed)
Patient ID: Theresa Buck, female   DOB: 15-Apr-1956, 61 y.o.   MRN: [id number]  Chief Complaint  Patient presents with  . Routine Post Op    HPI Theresa Buck is a 61 y.o. female here today for her post op hemorrhoidectomy done on 03/13/2017.  HPI  Past Medical History:  Diagnosis Date  . Allergy    recurring sinus problems  . Anxiety   . Arthritis   . Deviated septum   . Deviated septum   . GERD (gastroesophageal reflux disease)   . H/O sinusitis   . Headache    H/O MIGRAINES  . Hx of degenerative disc disease   . SVT (supraventricular tachycardia) (HCC)     Past Surgical History:  Procedure Laterality Date  . BACK SURGERY  09/26/2013   LUMBAR  . COLONOSCOPY  08/03/2015   Dr Vira Agar  . HEMORRHOID SURGERY N/A 03/13/2017   Procedure: HEMORRHOIDECTOMY;  Surgeon: Christene Lye, MD;  Location: [facility] ORS;  Service: General;  Laterality: N/A;  . UPPER GI ENDOSCOPY  08/03/2015   Dr Vira Agar    Family History  Problem Relation Age of Onset  . Brain cancer Mother   . ALS Father   . Diabetes Brother        multiple  . Heart disease Son        myocardial infarction, s/p CABG  . Throat cancer Brother     Social History Social History   Tobacco Use  . Smoking status: Never Smoker  . Smokeless tobacco: Never Used  Substance Use Topics  . Alcohol use: No    Alcohol/week: 0.0 oz  . Drug use: No    Allergies  Allergen Reactions  . Prednisone Other (See Comments)    Gi upset-CAN TAKE BUT NOT FOR LONG PERIODS OF TIME  . Ceftin [Cefuroxime Axetil] Rash    Current Outpatient Medications  Medication Sig Dispense Refill  . Artificial Tear Solution (GENTEAL TEARS) 0.1-0.2-0.3 % SOLN Place 1 drop 3 times/day as needed-between meals & bedtime into both eyes (dry eyes).    . Biotin 5000 MCG TABS Take 1 tablet daily by mouth.     . Calcium Carb-Cholecalciferol (CALCIUM + D3 PO) Take 1 tablet daily by mouth.    . diltiazem (CARDIZEM) 30 MG tablet Take 1 tablet  (30 mg total) by mouth 4 (four) times daily as needed. (Patient not taking: Reported on 03/13/2017) 90 tablet 6  . estradiol (ESTRACE) 0.1 MG/GM vaginal cream Place 1 Applicatorful at bedtime vaginally.    Marland Kitchen estradiol (ESTRACE) 1 MG tablet Take 0.5 mg daily by mouth.     . fluticasone (FLONASE) 50 MCG/ACT nasal spray instill 2 spray into each nostril once daily 16 g 5  . montelukast (SINGULAIR) 10 MG tablet Take 10 mg at bedtime by mouth.   1  . Multiple Vitamin (MULTIVITAMIN) tablet Take 1 tablet by mouth daily.    Marland Kitchen OVER THE COUNTER MEDICATION Take 1 capsule 2 (two) times daily by mouth. XEO Omega    . OVER THE COUNTER MEDICATION Take 1 capsule 2 (two) times daily by mouth. Micro Plex VM    . OVER THE COUNTER MEDICATION Take 1 capsule 2 (two) times daily by mouth. Alpha CRS    . oxyCODONE-acetaminophen (ROXICET) 5-325 MG tablet Take 1 tablet every 4 (four) hours as needed by mouth. 30 tablet 0  . pantoprazole (PROTONIX) 40 MG tablet Take 1 tablet (40 mg total) by mouth daily. (Patient taking differently: Take 40 mg  every morning by mouth. ) 30 tablet 5  . Probiotic Product (PROBIOTIC ADVANCED PO) Take 1 capsule daily by mouth.     . progesterone (PROMETRIUM) 100 MG capsule Take 100 mg daily by mouth.  0  . propranolol (INDERAL) 20 MG tablet Take 1 tablet (20 mg total) by mouth 3 (three) times daily as needed. (Patient not taking: Reported on 03/13/2017) 90 tablet 1  . traZODone (DESYREL) 50 MG tablet take 1/2 to 1 tablets by mouth at bedtime if needed for sleep 30 tablet 3   No current facility-administered medications for this visit.     Review of Systems Review of Systems  Constitutional: Negative.   Respiratory: Negative.   Cardiovascular: Negative.     Blood pressure 118/70, pulse 76, resp. rate 12, height 5\' 4"  (1.626 m), weight 146 lb (66.2 kg), last menstrual period 07/15/2012.  Physical Exam Physical Exam Anal exam- both incisions are clean and intact. No swelling, or signs of  infection. Data Reviewed   Assessment    Post op external hemorrhoidectomy 2 columns.  Doing well.    Plan    Patient to return in two weeks. The patient is aware to call back for any questions or concerns.    HPI, Physical Exam, Assessment and Plan have been scribed under the direction and in the presence of Mckinley Jewel, MD  Gaspar Cola, CMA    [facility] G 03/22/2017, 11:04 AM

## 2017-03-29 DIAGNOSIS — M25562 Pain in left knee: Secondary | ICD-10-CM | POA: Diagnosis not present

## 2017-03-29 DIAGNOSIS — G8929 Other chronic pain: Secondary | ICD-10-CM | POA: Diagnosis not present

## 2017-04-03 ENCOUNTER — Ambulatory Visit (INDEPENDENT_AMBULATORY_CARE_PROVIDER_SITE_OTHER): Payer: BLUE CROSS/BLUE SHIELD | Admitting: General Surgery

## 2017-04-03 VITALS — BP 124/72 | HR 80 | Resp 12 | Ht 66.0 in | Wt 145.0 lb

## 2017-04-03 DIAGNOSIS — K644 Residual hemorrhoidal skin tags: Secondary | ICD-10-CM

## 2017-04-03 NOTE — Progress Notes (Signed)
Patient ID: Theresa Buck, female   DOB: 1955/08/19, 61 y.o.   MRN: [id number]  Chief Complaint  Patient presents with  . Routine Post Op    HPI  Theresa Buck is a 61 y.o. female.  Here for postoperative visit, hemorrhoidectomy on 03-13-17.Patient states she ius doing well.   HPI  Past Medical History:  Diagnosis Date  . Allergy    recurring sinus problems  . Anxiety   . Arthritis   . Deviated septum   . Deviated septum   . GERD (gastroesophageal reflux disease)   . H/O sinusitis   . Headache    H/O MIGRAINES  . Hx of degenerative disc disease   . SVT (supraventricular tachycardia) (HCC)     Past Surgical History:  Procedure Laterality Date  . BACK SURGERY  09/26/2013   LUMBAR  . COLONOSCOPY  08/03/2015   Dr Vira Agar  . HEMORRHOID SURGERY N/A 03/13/2017   Procedure: HEMORRHOIDECTOMY;  Surgeon: Christene Lye, MD;  Location: [facility] ORS;  Service: General;  Laterality: N/A;  . UPPER GI ENDOSCOPY  08/03/2015   Dr Vira Agar    Family History  Problem Relation Age of Onset  . Brain cancer Mother   . ALS Father   . Diabetes Brother        multiple  . Heart disease Son        myocardial infarction, s/p CABG  . Throat cancer Brother     Social History Social History   Tobacco Use  . Smoking status: Never Smoker  . Smokeless tobacco: Never Used  Substance Use Topics  . Alcohol use: No    Alcohol/week: 0.0 oz  . Drug use: No    Allergies  Allergen Reactions  . Prednisone Other (See Comments)    Gi upset-CAN TAKE BUT NOT FOR LONG PERIODS OF TIME  . Ceftin [Cefuroxime Axetil] Rash    Current Outpatient Medications  Medication Sig Dispense Refill  . Artificial Tear Solution (GENTEAL TEARS) 0.1-0.2-0.3 % SOLN Place 1 drop 3 times/day as needed-between meals & bedtime into both eyes (dry eyes).    . Biotin 5000 MCG TABS Take 1 tablet daily by mouth.     . Calcium Carb-Cholecalciferol (CALCIUM + D3 PO) Take 1 tablet daily by mouth.    . diltiazem  (CARDIZEM) 30 MG tablet Take 1 tablet (30 mg total) by mouth 4 (four) times daily as needed. 90 tablet 6  . estradiol (ESTRACE) 0.1 MG/GM vaginal cream Place 1 Applicatorful at bedtime vaginally.    Marland Kitchen estradiol (ESTRACE) 1 MG tablet Take 0.5 mg daily by mouth.     . fluticasone (FLONASE) 50 MCG/ACT nasal spray instill 2 spray into each nostril once daily 16 g 5  . montelukast (SINGULAIR) 10 MG tablet Take 10 mg at bedtime by mouth.   1  . Multiple Vitamin (MULTIVITAMIN) tablet Take 1 tablet by mouth daily.    Marland Kitchen OVER THE COUNTER MEDICATION Take 1 capsule 2 (two) times daily by mouth. XEO Omega    . OVER THE COUNTER MEDICATION Take 1 capsule 2 (two) times daily by mouth. Micro Plex VM    . OVER THE COUNTER MEDICATION Take 1 capsule 2 (two) times daily by mouth. Alpha CRS    . oxyCODONE-acetaminophen (ROXICET) 5-325 MG tablet Take 1 tablet every 4 (four) hours as needed by mouth. 30 tablet 0  . pantoprazole (PROTONIX) 40 MG tablet Take 1 tablet (40 mg total) by mouth daily. (Patient taking differently: Take 40 mg every  morning by mouth. ) 30 tablet 5  . Probiotic Product (PROBIOTIC ADVANCED PO) Take 1 capsule daily by mouth.     . progesterone (PROMETRIUM) 100 MG capsule Take 100 mg daily by mouth.  0  . propranolol (INDERAL) 20 MG tablet Take 1 tablet (20 mg total) by mouth 3 (three) times daily as needed. 90 tablet 1  . traZODone (DESYREL) 50 MG tablet take 1/2 to 1 tablets by mouth at bedtime if needed for sleep 30 tablet 3   No current facility-administered medications for this visit.     Review of Systems Review of Systems  Constitutional: Negative.   Respiratory: Negative.   Cardiovascular: Negative.     Blood pressure 124/72, pulse 80, resp. rate 12, height 5\' 6"  (1.676 m), weight 145 lb (65.8 kg), last menstrual period 07/15/2012.  Physical Exam Physical Exam  Constitutional: She is oriented to person, place, and time. She appears well-developed and well-nourished.  Neurological:  She is alert and oriented to person, place, and time.  Skin: Skin is warm and dry.  Hemorrhoidectomy site is clean and healing very well. 2 loose monocyl sutures were removed.   Data Reviewed  Prior notes reviewed  Assessment    Post external hemorrhoidectomy, doing well     Plan       Patient to return as needed. The patient is aware to call back for any questions or concerns.   HPI, Physical Exam, Assessment and Plan have been scribed under the direction and in the presence of Mckinley Jewel, MD Karie Fetch, RN I have completed the exam and reviewed the above documentation for accuracy and completeness.  I agree with the above.  Haematologist has been used and any errors in dictation or transcription are unintentional.  Seeplaputhur G. Jamal Collin, M.D., F.A.C.S.   Junie Panning G 04/03/2017, 3:53 PM

## 2017-04-03 NOTE — Patient Instructions (Signed)
Patient to return as needed. The patient is aware to call back for any questions or concerns. 

## 2017-04-06 ENCOUNTER — Other Ambulatory Visit: Payer: Self-pay | Admitting: Internal Medicine

## 2017-05-09 DIAGNOSIS — J342 Deviated nasal septum: Secondary | ICD-10-CM | POA: Diagnosis not present

## 2017-05-09 DIAGNOSIS — J019 Acute sinusitis, unspecified: Secondary | ICD-10-CM | POA: Diagnosis not present

## 2017-05-22 DIAGNOSIS — H5213 Myopia, bilateral: Secondary | ICD-10-CM | POA: Diagnosis not present

## 2017-05-31 DIAGNOSIS — J342 Deviated nasal septum: Secondary | ICD-10-CM | POA: Diagnosis not present

## 2017-05-31 DIAGNOSIS — J301 Allergic rhinitis due to pollen: Secondary | ICD-10-CM | POA: Diagnosis not present

## 2017-05-31 DIAGNOSIS — Z01419 Encounter for gynecological examination (general) (routine) without abnormal findings: Secondary | ICD-10-CM | POA: Diagnosis not present

## 2017-05-31 DIAGNOSIS — J019 Acute sinusitis, unspecified: Secondary | ICD-10-CM | POA: Diagnosis not present

## 2017-06-08 ENCOUNTER — Ambulatory Visit: Payer: BLUE CROSS/BLUE SHIELD | Admitting: Internal Medicine

## 2017-06-08 DIAGNOSIS — K649 Unspecified hemorrhoids: Secondary | ICD-10-CM

## 2017-06-08 DIAGNOSIS — N760 Acute vaginitis: Secondary | ICD-10-CM

## 2017-06-08 DIAGNOSIS — I471 Supraventricular tachycardia: Secondary | ICD-10-CM | POA: Diagnosis not present

## 2017-06-08 DIAGNOSIS — F439 Reaction to severe stress, unspecified: Secondary | ICD-10-CM

## 2017-06-08 DIAGNOSIS — Z9109 Other allergy status, other than to drugs and biological substances: Secondary | ICD-10-CM | POA: Diagnosis not present

## 2017-06-08 DIAGNOSIS — K219 Gastro-esophageal reflux disease without esophagitis: Secondary | ICD-10-CM | POA: Diagnosis not present

## 2017-06-08 MED ORDER — FLUCONAZOLE 150 MG PO TABS: ORAL_TABLET | ORAL | 0 refills | Status: DC

## 2017-06-08 NOTE — Progress Notes (Signed)
Patient ID: Theresa Buck, female   DOB: 1956-03-09, 62 y.o.   MRN: [id number]   Subjective:    Patient ID: Theresa Buck, female    DOB: 1955-05-14, 62 y.o.   MRN: [id number]  HPI  Patient here for a scheduled follow up.  She reports she is doing relatively well.  Has been having problems with her sinuses.  Seeing Dr Pryor Ochoa.  Has been on prednisone and abx.  Symptoms better.  Recently placed on clindamycin.  She brought up concerns regarding taking clindamycin.  States a coworker reported developing c.diff after taking.  She plans to discuss with Dr Pryor Ochoa regarding changing the abx. Does feel better.  No chest congestion.  No increased heart rate or palpitations.  No sob.  On acid reflux. No abdominal pan.  Bowels moving.  Recently had hemorrhoid procedure - Dr Jamal Collin.  Increased stress.  Her boyfriend has been in the hospital.  She feels she is doing ok.  Does not feel needs any further intervention.     Past Medical History:  Diagnosis Date  . Allergy    recurring sinus problems  . Anxiety   . Arthritis   . Deviated septum   . Deviated septum   . GERD (gastroesophageal reflux disease)   . H/O sinusitis   . Headache    H/O MIGRAINES  . Hx of degenerative disc disease   . SVT (supraventricular tachycardia) (HCC)    Past Surgical History:  Procedure Laterality Date  . BACK SURGERY  09/26/2013   LUMBAR  . COLONOSCOPY  08/03/2015   Dr Vira Agar  . HEMORRHOID SURGERY N/A 03/13/2017   Procedure: HEMORRHOIDECTOMY;  Surgeon: Christene Lye, MD;  Location: [facility] ORS;  Service: General;  Laterality: N/A;  . UPPER GI ENDOSCOPY  08/03/2015   Dr Vira Agar   Family History  Problem Relation Age of Onset  . Brain cancer Mother   . ALS Father   . Diabetes Brother        multiple  . Heart disease Son        myocardial infarction, s/p CABG  . Throat cancer Brother    Social History   Socioeconomic History  . Marital status: Widowed    Spouse name: None  . Number of children:  3  . Years of education: None  . Highest education level: None  Social Needs  . Financial resource strain: None  . Food insecurity - worry: None  . Food insecurity - inability: None  . Transportation needs - medical: None  . Transportation needs - non-medical: None  Occupational History  . None  Tobacco Use  . Smoking status: Never Smoker  . Smokeless tobacco: Never Used  Substance and Sexual Activity  . Alcohol use: No    Alcohol/week: 0.0 oz  . Drug use: No  . Sexual activity: None  Other Topics Concern  . None  Social History Narrative  . None    Outpatient Encounter Medications as of 06/08/2017  Medication Sig  . Artificial Tear Solution (GENTEAL TEARS) 0.1-0.2-0.3 % SOLN Place 1 drop 3 times/day as needed-between meals & bedtime into both eyes (dry eyes).  . Biotin 5000 MCG TABS Take 1 tablet daily by mouth.   . Calcium Carb-Cholecalciferol (CALCIUM + D3 PO) Take 1 tablet daily by mouth.  . diltiazem (CARDIZEM) 30 MG tablet Take 1 tablet (30 mg total) by mouth 4 (four) times daily as needed.  Marland Kitchen estradiol (ESTRACE) 0.1 MG/GM vaginal cream Place 1 Applicatorful at bedtime vaginally.  Marland Kitchen  estradiol (ESTRACE) 1 MG tablet Take 0.5 mg daily by mouth.   . fluconazole (DIFLUCAN) 150 MG tablet Take one tablet x 1.  If persistent symptoms after 3 days, then repeat x 1.  . fluticasone (FLONASE) 50 MCG/ACT nasal spray instill 2 spray into each nostril once daily  . montelukast (SINGULAIR) 10 MG tablet Take 10 mg at bedtime by mouth.   . Multiple Vitamin (MULTIVITAMIN) tablet Take 1 tablet by mouth daily.  Marland Kitchen OVER THE COUNTER MEDICATION Take 1 capsule 2 (two) times daily by mouth. XEO Omega  . OVER THE COUNTER MEDICATION Take 1 capsule 2 (two) times daily by mouth. Micro Plex VM  . OVER THE COUNTER MEDICATION Take 1 capsule 2 (two) times daily by mouth. Alpha CRS  . oxyCODONE-acetaminophen (ROXICET) 5-325 MG tablet Take 1 tablet every 4 (four) hours as needed by mouth.  . pantoprazole  (PROTONIX) 40 MG tablet take 1 tablet by mouth once daily  . Probiotic Product (PROBIOTIC ADVANCED PO) Take 1 capsule daily by mouth.   . progesterone (PROMETRIUM) 100 MG capsule Take 100 mg daily by mouth.  . propranolol (INDERAL) 20 MG tablet Take 1 tablet (20 mg total) by mouth 3 (three) times daily as needed.  . traZODone (DESYREL) 50 MG tablet take 1/2 to 1 tablets by mouth at bedtime if needed for sleep   No facility-administered encounter medications on file as of 06/08/2017.     Review of Systems  Constitutional: Negative for appetite change and unexpected weight change.  HENT: Negative for sore throat.        Sinus congestion better.    Respiratory: Negative for cough, chest tightness and shortness of breath.   Cardiovascular: Negative for chest pain, palpitations and leg swelling.  Gastrointestinal: Negative for abdominal pain, diarrhea, nausea and vomiting.  Genitourinary: Negative for difficulty urinating and dysuria.  Musculoskeletal: Negative for joint swelling and myalgias.  Skin: Negative for color change and rash.  Neurological: Negative for dizziness, light-headedness and headaches.  Psychiatric/Behavioral: Negative for agitation and dysphoric mood.       Objective:    Physical Exam  Constitutional: She appears well-developed and well-nourished. No distress.  HENT:  Nose: Nose normal.  Mouth/Throat: Oropharynx is clear and moist.  Neck: Neck supple. No thyromegaly present.  Cardiovascular: Normal rate and regular rhythm.  Pulmonary/Chest: Breath sounds normal. No respiratory distress. She has no wheezes.  Abdominal: Soft. Bowel sounds are normal. There is no tenderness.  Musculoskeletal: She exhibits no edema or tenderness.  Lymphadenopathy:    She has no cervical adenopathy.  Skin: No rash noted. No erythema.  Psychiatric: She has a normal mood and affect. Her behavior is normal.    BP 122/70 (BP Location: Left Arm, Patient Position: Sitting, Cuff Size:  Normal)   Pulse 74   Temp 97.9 F (36.6 C) (Oral)   Resp 18   Wt 146 lb 3.2 oz (66.3 kg)   LMP 07/15/2012   SpO2 98%   BMI 23.60 kg/m  Wt Readings from Last 3 Encounters:  06/08/17 146 lb 3.2 oz (66.3 kg)  04/03/17 145 lb (65.8 kg)  03/20/17 146 lb (66.2 kg)     Lab Results  Component Value Date   WBC 7.1 12/27/2016   HGB 13.9 12/27/2016   HCT 42.0 12/27/2016   PLT 229.0 12/27/2016   GLUCOSE 99 12/27/2016   CHOL 188 12/27/2016   TRIG 86.0 12/27/2016   HDL 42.80 12/27/2016   LDLCALC 128 (H) 12/27/2016   ALT 13 12/27/2016  AST 13 12/27/2016   NA 140 12/27/2016   K 4.0 12/27/2016   CL 103 12/27/2016   CREATININE 0.70 12/27/2016   BUN 10 12/27/2016   CO2 29 12/27/2016   TSH 2.17 12/27/2016       Assessment & Plan:   Problem List Items Addressed This Visit    Environmental allergies    Being followed by ENT.  Seeing Dr Pryor Ochoa.  Currently being treated for sinus infection. Doing better.        GERD (gastroesophageal reflux disease)    Followed by GI.  Controlled.        Hemorrhoids    S/p recent procedure by Dr Jamal Collin.  No problems reported today.       Paroxysmal supraventricular tachycardia (HCC)    Previously evaluated by Dr Rockey Situ.  Has propranolol to take if needed.  Stable.  Follow.        Stress    Increased stress as outlined.  Discussed with her today.  Overall she feels she is handling things relatively well.  Does not feel needs any further intervention.  Follow.        Vaginitis    On abx.  States may be starting to get a little vaginal irritation.  No significant symptoms.  Offered exam.  She declines.  Diflucan if needed.            Einar Pheasant, MD

## 2017-06-11 ENCOUNTER — Encounter: Payer: Self-pay | Admitting: Internal Medicine

## 2017-06-11 NOTE — Assessment & Plan Note (Signed)
On abx.  States may be starting to get a little vaginal irritation.  No significant symptoms.  Offered exam.  She declines.  Diflucan if needed.

## 2017-06-11 NOTE — Assessment & Plan Note (Signed)
Followed by GI.  Controlled.

## 2017-06-11 NOTE — Assessment & Plan Note (Signed)
Previously evaluated by Dr Rockey Situ.  Has propranolol to take if needed.  Stable.  Follow.

## 2017-06-11 NOTE — Assessment & Plan Note (Signed)
Increased stress as outlined.  Discussed with her today.  Overall she feels she is handling things relatively well.  Does not feel needs any further intervention.  Follow.   

## 2017-06-11 NOTE — Assessment & Plan Note (Signed)
S/p recent procedure by Dr Jamal Collin.  No problems reported today.

## 2017-06-11 NOTE — Assessment & Plan Note (Signed)
Being followed by ENT.  Seeing Dr Pryor Ochoa.  Currently being treated for sinus infection. Doing better.

## 2017-06-15 ENCOUNTER — Ambulatory Visit: Payer: BLUE CROSS/BLUE SHIELD | Admitting: Internal Medicine

## 2017-06-21 ENCOUNTER — Encounter: Payer: Self-pay | Admitting: Internal Medicine

## 2017-06-21 MED ORDER — SCOPOLAMINE 1 MG/3DAYS TD PT72: 1.0000 | MEDICATED_PATCH | TRANSDERMAL | 0 refills | Status: DC

## 2017-06-21 NOTE — Telephone Encounter (Signed)
rx ok'd for scopolamine patches #10 with no refills.  Pt notified via my chart.

## 2017-08-02 ENCOUNTER — Ambulatory Visit: Payer: Self-pay | Admitting: General Surgery

## 2017-09-10 ENCOUNTER — Encounter: Payer: Self-pay | Admitting: Internal Medicine

## 2017-09-28 ENCOUNTER — Ambulatory Visit: Payer: BLUE CROSS/BLUE SHIELD | Admitting: Podiatry

## 2017-10-02 ENCOUNTER — Ambulatory Visit: Payer: BLUE CROSS/BLUE SHIELD | Admitting: Podiatry

## 2017-10-02 ENCOUNTER — Encounter: Payer: Self-pay | Admitting: Internal Medicine

## 2017-10-03 ENCOUNTER — Encounter: Payer: Self-pay | Admitting: Internal Medicine

## 2017-10-03 NOTE — Telephone Encounter (Signed)
Pt was scheduled for 4:30. Caryl Pina spoke with pt trying to move up appt to 9:00

## 2017-10-03 NOTE — Telephone Encounter (Signed)
If there will be someone here tomorrow pm, I can work her in at 4:30 pm.  Will need pelvic exam/wet prep.

## 2017-10-03 NOTE — Telephone Encounter (Signed)
Patient cannot come in tomorrow at 12:30. She works in [place] so would need to be seen in the AM or PM but cannot come at lunch

## 2017-10-03 NOTE — Telephone Encounter (Signed)
Her last mammogram was 12/20/16.  Regarding the persistent vaginal issues, I can work her in to see what is going on.  If the other patient is not able to come in 2:30 on 10/05/17 - can schedule her in that spot.  If she is coming, then I will work her in tomorrow (Thursday - 10/04/17) at 12:30.

## 2017-10-04 ENCOUNTER — Encounter: Payer: Self-pay | Admitting: Internal Medicine

## 2017-10-04 ENCOUNTER — Other Ambulatory Visit (HOSPITAL_COMMUNITY)
Admission: RE | Admit: 2017-10-04 | Discharge: 2017-10-04 | Disposition: A | Discharge status: Home / Self Care | Payer: BLUE CROSS/BLUE SHIELD | Source: Ambulatory Visit | Attending: Internal Medicine | Admitting: Internal Medicine

## 2017-10-04 ENCOUNTER — Ambulatory Visit: Payer: BLUE CROSS/BLUE SHIELD | Admitting: Internal Medicine

## 2017-10-04 VITALS — BP 100/72 | HR 83 | Temp 98.2°F | Resp 15 | Ht 66.0 in | Wt 147.4 lb

## 2017-10-04 DIAGNOSIS — N644 Mastodynia: Secondary | ICD-10-CM

## 2017-10-04 DIAGNOSIS — N898 Other specified noninflammatory disorders of vagina: Secondary | ICD-10-CM | POA: Diagnosis not present

## 2017-10-04 DIAGNOSIS — K219 Gastro-esophageal reflux disease without esophagitis: Secondary | ICD-10-CM | POA: Diagnosis not present

## 2017-10-04 DIAGNOSIS — N760 Acute vaginitis: Secondary | ICD-10-CM | POA: Diagnosis not present

## 2017-10-04 DIAGNOSIS — F439 Reaction to severe stress, unspecified: Secondary | ICD-10-CM

## 2017-10-04 MED ORDER — NYSTATIN 100000 UNIT/GM EX CREA: 1.0000 "application " | TOPICAL_CREAM | Freq: Two times a day (BID) | CUTANEOUS | 0 refills | Status: DC

## 2017-10-04 NOTE — Patient Instructions (Signed)
Increase protonix to twice a day.  Take one 30 minutes before breakfast and one 30 minutes before evening meal.

## 2017-10-04 NOTE — Progress Notes (Addendum)
Patient ID: Theresa Buck, female   DOB: 02/20/1956, 62 y.o.   MRN: [id number]   Subjective:    Patient ID: Theresa Buck, female    DOB: Jan 15, 1956, 62 y.o.   MRN: [id number]  HPI  Patient here as a work in with concerns regarding possible vaginal irritation.  She had sinus infection approximately one month ago.  Took abx.  Also took diflucan.  Symptoms better.  Had return of symptoms over the last two weeks.  Vaginal irritation.  No abdominal pain.  Bowels moving with stool softener. Also reports having some breast tenderness in left breast.  Persistent.  Not due screening mammogram until 11/2017.  No nodules.  No nipple discharge.     Past Medical History:  Diagnosis Date  . Allergy    recurring sinus problems  . Anxiety   . Arthritis   . Deviated septum   . Deviated septum   . GERD (gastroesophageal reflux disease)   . H/O sinusitis   . Headache    H/O MIGRAINES  . Hx of degenerative disc disease   . SVT (supraventricular tachycardia) (HCC)    Past Surgical History:  Procedure Laterality Date  . BACK SURGERY  09/26/2013   LUMBAR  . COLONOSCOPY  08/03/2015   Dr Vira Agar  . HEMORRHOID SURGERY N/A 03/13/2017   Procedure: HEMORRHOIDECTOMY;  Surgeon: Christene Lye, MD;  Location: [facility] ORS;  Service: General;  Laterality: N/A;  . UPPER GI ENDOSCOPY  08/03/2015   Dr Vira Agar   Family History  Problem Relation Age of Onset  . Brain cancer Mother   . ALS Father   . Diabetes Brother        multiple  . Heart disease Son        myocardial infarction, s/p CABG  . Throat cancer Brother    Social History   Socioeconomic History  . Marital status: Widowed    Spouse name: Not on file  . Number of children: 3  . Years of education: Not on file  . Highest education level: Not on file  Occupational History  . Not on file  Social Needs  . Financial resource strain: Not on file  . Food insecurity:    Worry: Not on file    Inability: Not on file  . Transportation  needs:    Medical: Not on file    Non-medical: Not on file  Tobacco Use  . Smoking status: Never Smoker  . Smokeless tobacco: Never Used  Substance and Sexual Activity  . Alcohol use: No    Alcohol/week: 0.0 oz  . Drug use: No  . Sexual activity: Not on file  Lifestyle  . Physical activity:    Days per week: Not on file    Minutes per session: Not on file  . Stress: Not on file  Relationships  . Social connections:    Talks on phone: Not on file    Gets together: Not on file    Attends religious service: Not on file    Active member of club or organization: Not on file    Attends meetings of clubs or organizations: Not on file    Relationship status: Not on file  Other Topics Concern  . Not on file  Social History Narrative  . Not on file    Outpatient Encounter Medications as of 10/04/2017  Medication Sig  . Artificial Tear Solution (GENTEAL TEARS) 0.1-0.2-0.3 % SOLN Place 1 drop 3 times/day as needed-between meals & bedtime into both  eyes (dry eyes).  . Biotin 5000 MCG TABS Take 1 tablet daily by mouth.   . Calcium Carb-Cholecalciferol (CALCIUM + D3 PO) Take 1 tablet daily by mouth.  . estradiol (ESTRACE) 0.1 MG/GM vaginal cream Place 1 Applicatorful at bedtime vaginally.  Marland Kitchen estradiol (ESTRACE) 1 MG tablet Take 0.5 mg daily by mouth.   . fluticasone (FLONASE) 50 MCG/ACT nasal spray instill 2 spray into each nostril once daily  . montelukast (SINGULAIR) 10 MG tablet Take 10 mg at bedtime by mouth.   . Multiple Vitamin (MULTIVITAMIN) tablet Take 1 tablet by mouth daily.  Marland Kitchen OVER THE COUNTER MEDICATION Take 1 capsule 2 (two) times daily by mouth. XEO Omega  . OVER THE COUNTER MEDICATION Take 1 capsule 2 (two) times daily by mouth. Micro Plex VM  . OVER THE COUNTER MEDICATION Take 1 capsule 2 (two) times daily by mouth. Alpha CRS  . pantoprazole (PROTONIX) 40 MG tablet take 1 tablet by mouth once daily  . Probiotic Product (PROBIOTIC ADVANCED PO) Take 1 capsule daily by mouth.    . progesterone (PROMETRIUM) 100 MG capsule Take 100 mg daily by mouth.  . propranolol (INDERAL) 20 MG tablet Take 1 tablet (20 mg total) by mouth 3 (three) times daily as needed.  Marland Kitchen scopolamine (TRANSDERM-SCOP, 1.5 MG,) 1 MG/3DAYS Place 1 patch (1.5 mg total) onto the skin every 3 (three) days. Behind ear  . traZODone (DESYREL) 50 MG tablet take 1/2 to 1 tablets by mouth at bedtime if needed for sleep  . diltiazem (CARDIZEM) 30 MG tablet Take 1 tablet (30 mg total) by mouth 4 (four) times daily as needed. (Patient not taking: Reported on 10/04/2017)  . nystatin cream (MYCOSTATIN) Apply 1 application topically 2 (two) times daily.  Marland Kitchen oxyCODONE-acetaminophen (ROXICET) 5-325 MG tablet Take 1 tablet every 4 (four) hours as needed by mouth. (Patient not taking: Reported on 10/04/2017)  . [DISCONTINUED] fluconazole (DIFLUCAN) 150 MG tablet Take one tablet x 1.  If persistent symptoms after 3 days, then repeat x 1. (Patient not taking: Reported on 10/04/2017)   No facility-administered encounter medications on file as of 10/04/2017.     Review of Systems  Constitutional: Negative for appetite change and fever.  HENT: Negative for congestion and sinus pressure.   Respiratory: Negative for cough and shortness of breath.   Cardiovascular: Negative for chest pain and leg swelling.  Gastrointestinal: Negative for abdominal pain, diarrhea, nausea and vomiting.  Genitourinary: Negative for dysuria.       Vaginal irritation as outlined.    Skin: Negative for color change and rash.  Neurological: Negative for dizziness and headaches.  Psychiatric/Behavioral: Negative for agitation and dysphoric mood.       Objective:    Physical Exam  Constitutional: She appears well-developed and well-nourished. No distress.  HENT:  Nose: Nose normal.  Mouth/Throat: Oropharynx is clear and moist.  Neck: Neck supple.  Cardiovascular: Normal rate and regular rhythm.  Pulmonary/Chest: Breath sounds normal. No  respiratory distress. She has no wheezes.  Breasts:  No nipple discharge or nipple retraction present.  Could not appreciate any distinct nodules or axillary adenopathy.  Some increased discomfort left breast 4-5:00.    Abdominal: Soft. Bowel sounds are normal. There is no tenderness.  Genitourinary:  Genitourinary Comments: Normal external genitalia.  Vaginal vault without lesions.  KOH/wet prep sent.  Could not appreciate any adnexal masses or tenderness.    Musculoskeletal: She exhibits no edema or tenderness.  Lymphadenopathy:    She has no cervical  adenopathy.  Skin: No rash noted. No erythema.  Psychiatric: She has a normal mood and affect. Her behavior is normal.    BP 100/72 (BP Location: Left Arm, Patient Position: Sitting, Cuff Size: Normal)   Pulse 83   Temp 98.2 F (36.8 C) (Oral)   Resp 15   Ht 5\' 6"  (1.676 m)   Wt 147 lb 6.4 oz (66.9 kg)   LMP 07/15/2012   SpO2 96%   BMI 23.79 kg/m  Wt Readings from Last 3 Encounters:  10/04/17 147 lb 6.4 oz (66.9 kg)  06/08/17 146 lb 3.2 oz (66.3 kg)  04/03/17 145 lb (65.8 kg)     Lab Results  Component Value Date   WBC 7.1 12/27/2016   HGB 13.9 12/27/2016   HCT 42.0 12/27/2016   PLT 229.0 12/27/2016   GLUCOSE 99 12/27/2016   CHOL 188 12/27/2016   TRIG 86.0 12/27/2016   HDL 42.80 12/27/2016   LDLCALC 128 (H) 12/27/2016   ALT 13 12/27/2016   AST 13 12/27/2016   NA 140 12/27/2016   K 4.0 12/27/2016   CL 103 12/27/2016   CREATININE 0.70 12/27/2016   BUN 10 12/27/2016   CO2 29 12/27/2016   TSH 2.17 12/27/2016       Assessment & Plan:   Problem List Items Addressed This Visit    Breast tenderness in female    Left breast tenderness as outlined.  Exam as outlined.  Discussed obtaining diagnostic mammogram with ultrasound and given due f/u screening soon, would like to do bilateral.  She wants to check with her insurance and will let me know if wants scheduled.        GERD (gastroesophageal reflux disease)    Some  acid reflux.  Increase protonix to bid.  Follow.  Get her back in soon to reassess.        Stress    Overall handling things relatively well.  Follow.        Vaginitis    Vaginal irritation as outlined.  Nystatin cream as directed.  KOH/wet prep sent.         Other Visit Diagnoses    Vaginal discharge    -  Primary   Relevant Orders   Cervicovaginal ancillary only (Completed)       Einar Pheasant, MD

## 2017-10-05 LAB — CERVICOVAGINAL ANCILLARY ONLY: Wet Prep (BD Affirm): NEGATIVE

## 2017-10-07 ENCOUNTER — Encounter: Payer: Self-pay | Admitting: Internal Medicine

## 2017-10-07 NOTE — Assessment & Plan Note (Signed)
Some acid reflux.  Increase protonix to bid.  Follow.  Get her back in soon to reassess.

## 2017-10-07 NOTE — Assessment & Plan Note (Signed)
Overall handling things relatively well.  Follow.  

## 2017-10-07 NOTE — Assessment & Plan Note (Signed)
Vaginal irritation as outlined.  Nystatin cream as directed.  KOH/wet prep sent.

## 2017-10-07 NOTE — Assessment & Plan Note (Addendum)
Left breast tenderness as outlined.  Exam as outlined.  Discussed obtaining diagnostic mammogram with ultrasound and given due f/u screening soon, would like to do bilateral.  She wants to check with her insurance and will let me know if wants scheduled.

## 2017-10-08 ENCOUNTER — Encounter: Payer: Self-pay | Admitting: Internal Medicine

## 2017-11-05 ENCOUNTER — Other Ambulatory Visit: Payer: Self-pay | Admitting: Internal Medicine

## 2017-11-07 ENCOUNTER — Encounter: Payer: Self-pay | Admitting: Internal Medicine

## 2017-11-07 DIAGNOSIS — N644 Mastodynia: Secondary | ICD-10-CM

## 2017-11-07 DIAGNOSIS — Z1239 Encounter for other screening for malignant neoplasm of breast: Secondary | ICD-10-CM

## 2017-11-08 NOTE — Telephone Encounter (Signed)
OK to place order for bilateral diagnostic and ultrasound

## 2017-11-11 NOTE — Telephone Encounter (Signed)
I have placed the order for her mammogram.  Is a diagnostic mammogram for left breast tenderness and due screening.  Last screening 12/20/17.    Was not sure if she could call and schedule her own.  She would like scheduled on 12/21/17 - she is trying to get off work.  See her note.  (may need early am or late pm appt - after 12/20/17).  Thanks

## 2017-11-20 ENCOUNTER — Other Ambulatory Visit: Payer: BLUE CROSS/BLUE SHIELD

## 2017-12-11 ENCOUNTER — Encounter: Payer: Self-pay | Admitting: Internal Medicine

## 2017-12-13 NOTE — Telephone Encounter (Signed)
Patients appt has been moved to 8/30

## 2017-12-18 ENCOUNTER — Encounter: Payer: BLUE CROSS/BLUE SHIELD | Admitting: Internal Medicine

## 2017-12-21 ENCOUNTER — Ambulatory Visit: Payer: BLUE CROSS/BLUE SHIELD | Admitting: Internal Medicine

## 2017-12-21 ENCOUNTER — Ambulatory Visit
Admission: RE | Admit: 2017-12-21 | Discharge: 2017-12-21 | Disposition: A | Discharge status: Home / Self Care | Payer: BLUE CROSS/BLUE SHIELD | Source: Ambulatory Visit | Attending: Internal Medicine | Admitting: Internal Medicine

## 2017-12-21 ENCOUNTER — Other Ambulatory Visit: Payer: BLUE CROSS/BLUE SHIELD

## 2017-12-21 VITALS — BP 128/74 | HR 69 | Temp 97.5°F | Resp 18 | Ht 66.0 in | Wt 139.2 lb

## 2017-12-21 DIAGNOSIS — R922 Inconclusive mammogram: Secondary | ICD-10-CM | POA: Diagnosis not present

## 2017-12-21 DIAGNOSIS — Z1239 Encounter for other screening for malignant neoplasm of breast: Secondary | ICD-10-CM

## 2017-12-21 DIAGNOSIS — Z0000 Encounter for general adult medical examination without abnormal findings: Secondary | ICD-10-CM

## 2017-12-21 DIAGNOSIS — N644 Mastodynia: Secondary | ICD-10-CM

## 2017-12-21 DIAGNOSIS — N6001 Solitary cyst of right breast: Secondary | ICD-10-CM | POA: Diagnosis not present

## 2017-12-21 DIAGNOSIS — Z13220 Encounter for screening for lipoid disorders: Secondary | ICD-10-CM

## 2017-12-21 DIAGNOSIS — Z1231 Encounter for screening mammogram for malignant neoplasm of breast: Secondary | ICD-10-CM | POA: Insufficient documentation

## 2017-12-21 DIAGNOSIS — I471 Supraventricular tachycardia, unspecified: Secondary | ICD-10-CM

## 2017-12-21 DIAGNOSIS — Z9109 Other allergy status, other than to drugs and biological substances: Secondary | ICD-10-CM | POA: Diagnosis not present

## 2017-12-21 DIAGNOSIS — K219 Gastro-esophageal reflux disease without esophagitis: Secondary | ICD-10-CM | POA: Diagnosis not present

## 2017-12-21 DIAGNOSIS — F439 Reaction to severe stress, unspecified: Secondary | ICD-10-CM

## 2017-12-21 NOTE — Assessment & Plan Note (Signed)
Physical today 12/21/17.  PAP 08/11/15 - negative with negative HPV.  Mammogram 12/21/17.  Colonoscopy 05/19/12 - recommended f/u colonoscopy in 5 years.

## 2017-12-21 NOTE — Progress Notes (Signed)
Patient ID: Theresa Buck, female   DOB: [birth year], 62 y.o.   MRN: [id number]   Subjective:    Patient ID: Theresa Buck, female    DOB: [birth year], 62 y.o.   MRN: [id number]  HPI  Patient here for her physical exam.  She reports she is doing relatively well.  Has been having breast tenderness left breast.  See last note.  Just had mammogram today.  mammo ok.  Discussed with her today.  No pain now.  Desires no further w/up or evaluation.  Tries to stay active.  No chest pain.  Has adjusted her diet.  Lost weight.  No acid reflux.  No abdominal pain.  Bowels moving.  No urine change.  No sob.  No significant sinus problems.  Had recent colonoscopy.  Need results.  Handling stress.     Past Medical History:  Diagnosis Date  . Allergy    recurring sinus problems  . Anxiety   . Arthritis   . Deviated septum   . Deviated septum   . GERD (gastroesophageal reflux disease)   . H/O sinusitis   . Headache    H/O MIGRAINES  . Hx of degenerative disc disease   . SVT (supraventricular tachycardia) (HCC)    Past Surgical History:  Procedure Laterality Date  . BACK SURGERY  09/26/2013   LUMBAR  . COLONOSCOPY  08/03/2015   Dr Vira Agar  . HEMORRHOID SURGERY N/A 03/13/2017   Procedure: HEMORRHOIDECTOMY;  Surgeon: Christene Lye, MD;  Location: [facility] ORS;  Service: General;  Laterality: N/A;  . UPPER GI ENDOSCOPY  08/03/2015   Dr Vira Agar   Family History  Problem Relation Age of Onset  . Brain cancer Mother   . ALS Father   . Diabetes Brother        multiple  . Heart disease Son        myocardial infarction, s/p CABG  . Throat cancer Brother    Social History   Socioeconomic History  . Marital status: Widowed    Spouse name: Not on file  . Number of children: 3  . Years of education: Not on file  . Highest education level: Not on file  Occupational History  . Not on file  Social Needs  . Financial resource strain: Not on file  . Food insecurity:    Worry: Not on file     Inability: Not on file  . Transportation needs:    Medical: Not on file    Non-medical: Not on file  Tobacco Use  . Smoking status: Never Smoker  . Smokeless tobacco: Never Used  Substance and Sexual Activity  . Alcohol use: No    Alcohol/week: 0.0 standard drinks  . Drug use: No  . Sexual activity: Not on file  Lifestyle  . Physical activity:    Days per week: Not on file    Minutes per session: Not on file  . Stress: Not on file  Relationships  . Social connections:    Talks on phone: Not on file    Gets together: Not on file    Attends religious service: Not on file    Active member of club or organization: Not on file    Attends meetings of clubs or organizations: Not on file    Relationship status: Not on file  Other Topics Concern  . Not on file  Social History Narrative  . Not on file    Outpatient Encounter Medications as of 12/21/2017  Medication Sig  .  Artificial Tear Solution (GENTEAL TEARS) 0.1-0.2-0.3 % SOLN Place 1 drop 3 times/day as needed-between meals & bedtime into both eyes (dry eyes).  . Biotin 5000 MCG TABS Take 1 tablet daily by mouth.   . Calcium Carb-Cholecalciferol (CALCIUM + D3 PO) Take 1 tablet daily by mouth.  . estradiol (ESTRACE) 0.1 MG/GM vaginal cream Place 1 Applicatorful at bedtime vaginally.  Marland Kitchen estradiol (ESTRACE) 1 MG tablet Take 0.5 mg daily by mouth.   . fluticasone (FLONASE) 50 MCG/ACT nasal spray instill 2 spray into each nostril once daily  . montelukast (SINGULAIR) 10 MG tablet Take 10 mg at bedtime by mouth.   . Multiple Vitamin (MULTIVITAMIN) tablet Take 1 tablet by mouth daily.  Marland Kitchen OVER THE COUNTER MEDICATION Take 1 capsule 2 (two) times daily by mouth. XEO Omega  . OVER THE COUNTER MEDICATION Take 1 capsule 2 (two) times daily by mouth. Micro Plex VM  . OVER THE COUNTER MEDICATION Take 1 capsule 2 (two) times daily by mouth. Alpha CRS  . pantoprazole (PROTONIX) 40 MG tablet TAKE 1 TABLET BY MOUTH ONCE DAILY  . Probiotic  Product (PROBIOTIC ADVANCED PO) Take 1 capsule daily by mouth.   . progesterone (PROMETRIUM) 100 MG capsule Take 100 mg daily by mouth.  . propranolol (INDERAL) 20 MG tablet Take 1 tablet (20 mg total) by mouth 3 (three) times daily as needed.  . traZODone (DESYREL) 50 MG tablet take 1/2 to 1 tablets by mouth at bedtime if needed for sleep  . [DISCONTINUED] diltiazem (CARDIZEM) 30 MG tablet Take 1 tablet (30 mg total) by mouth 4 (four) times daily as needed. (Patient not taking: Reported on 10/04/2017)  . [DISCONTINUED] nystatin cream (MYCOSTATIN) Apply 1 application topically 2 (two) times daily.  . [DISCONTINUED] oxyCODONE-acetaminophen (ROXICET) 5-325 MG tablet Take 1 tablet every 4 (four) hours as needed by mouth. (Patient not taking: Reported on 10/04/2017)  . [DISCONTINUED] scopolamine (TRANSDERM-SCOP, 1.5 MG,) 1 MG/3DAYS Place 1 patch (1.5 mg total) onto the skin every 3 (three) days. Behind ear   No facility-administered encounter medications on file as of 12/21/2017.     Review of Systems  Constitutional: Negative for appetite change and unexpected weight change.  HENT: Negative for congestion and sinus pressure.   Eyes: Negative for pain and visual disturbance.  Respiratory: Negative for cough, chest tightness and shortness of breath.   Cardiovascular: Negative for chest pain, palpitations and leg swelling.  Gastrointestinal: Negative for abdominal pain, diarrhea, nausea and vomiting.  Genitourinary: Negative for difficulty urinating and dysuria.  Musculoskeletal: Negative for back pain, joint swelling and myalgias.  Skin: Negative for color change and rash.  Neurological: Negative for dizziness, light-headedness and headaches.  Hematological: Negative for adenopathy. Does not bruise/bleed easily.  Psychiatric/Behavioral: Negative for agitation and dysphoric mood.       Objective:    Physical Exam  Constitutional: She is oriented to person, place, and time. She appears  well-developed and well-nourished. No distress.  HENT:  Nose: Nose normal.  Mouth/Throat: Oropharynx is clear and moist.  Eyes: Right eye exhibits no discharge. Left eye exhibits no discharge. No scleral icterus.  Neck: Neck supple. No thyromegaly present.  Cardiovascular: Normal rate and regular rhythm.  Pulmonary/Chest: Breath sounds normal. No accessory muscle usage. No tachypnea. No respiratory distress. She has no decreased breath sounds. She has no wheezes. She has no rhonchi. Right breast exhibits no inverted nipple, no mass, no nipple discharge and no tenderness (no axillary adenopathy). Left breast exhibits no inverted nipple, no mass,  no nipple discharge and no tenderness (no axilarry adenopathy).  Abdominal: Soft. Bowel sounds are normal. There is no tenderness.  Musculoskeletal: She exhibits no edema or tenderness.  Lymphadenopathy:    She has no cervical adenopathy.  Neurological: She is alert and oriented to person, place, and time.  Skin: No rash noted. No erythema.  Psychiatric: She has a normal mood and affect. Her behavior is normal.    BP 128/74 (BP Location: Left Arm, Patient Position: Sitting, Cuff Size: Normal)   Pulse 69   Temp (!) 97.5 F (36.4 C) (Oral)   Resp 18   Ht 5\' 6"  (1.676 m)   Wt 139 lb 3.2 oz (63.1 kg)   LMP 07/15/2012   SpO2 98%   BMI 22.47 kg/m  Wt Readings from Last 3 Encounters:  12/21/17 139 lb 3.2 oz (63.1 kg)  10/04/17 147 lb 6.4 oz (66.9 kg)  06/08/17 146 lb 3.2 oz (66.3 kg)     Lab Results  Component Value Date   WBC 7.1 12/27/2016   HGB 13.9 12/27/2016   HCT 42.0 12/27/2016   PLT 229.0 12/27/2016   GLUCOSE 99 12/27/2016   CHOL 188 12/27/2016   TRIG 86.0 12/27/2016   HDL 42.80 12/27/2016   LDLCALC 128 (H) 12/27/2016   ALT 13 12/27/2016   AST 13 12/27/2016   NA 140 12/27/2016   K 4.0 12/27/2016   CL 103 12/27/2016   CREATININE 0.70 12/27/2016   BUN 10 12/27/2016   CO2 29 12/27/2016   TSH 2.17 12/27/2016    US Breast  Ltd Uni Left Inc Axilla  Result Date: 12/21/2017 CLINICAL DATA:  Left breast tenderness in the 4-6 o'clock region.Previously demonstrated cyst in the 2 o'clock position of the left breast. EXAM: DIGITAL DIAGNOSTIC BILATERAL MAMMOGRAM WITH CAD AND TOMO ULTRASOUND LEFT BREAST COMPARISON:  Previous exam(s). ACR Breast Density Category d: The breast tissue is extremely dense, which lowers the sensitivity of mammography. FINDINGS: Stable mammographic appearance of the breasts with no findings suspicious for malignancy in either breast. Mammographic images were processed with CAD. On physical exam, the patient is tender to palpation in the 5 o'clock position of the left breast. There is no palpable mass in the areas of tenderness. Targeted ultrasound is performed, showing normal appearing breast tissue throughout the inferior left breast in the areas of tenderness. IMPRESSION: No evidence of malignancy. RECOMMENDATION: Bilateral screening mammogram in 1 year. I have discussed the findings and recommendations with the patient. Results were also provided in writing at the conclusion of the visit. If applicable, a reminder letter will be sent to the patient regarding the next appointment. BI-RADS CATEGORY  1: Negative. Electronically Signed   By: Claudie Revering M.D.   On: 12/21/2017 10:03   Mm Diag Breast Tomo Bilateral  Result Date: 12/21/2017 CLINICAL DATA:  Left breast tenderness in the 4-6 o'clock region.Previously demonstrated cyst in the 2 o'clock position of the left breast. EXAM: DIGITAL DIAGNOSTIC BILATERAL MAMMOGRAM WITH CAD AND TOMO ULTRASOUND LEFT BREAST COMPARISON:  Previous exam(s). ACR Breast Density Category d: The breast tissue is extremely dense, which lowers the sensitivity of mammography. FINDINGS: Stable mammographic appearance of the breasts with no findings suspicious for malignancy in either breast. Mammographic images were processed with CAD. On physical exam, the patient is tender to palpation  in the 5 o'clock position of the left breast. There is no palpable mass in the areas of tenderness. Targeted ultrasound is performed, showing normal appearing breast tissue throughout the inferior left  breast in the areas of tenderness. IMPRESSION: No evidence of malignancy. RECOMMENDATION: Bilateral screening mammogram in 1 year. I have discussed the findings and recommendations with the patient. Results were also provided in writing at the conclusion of the visit. If applicable, a reminder letter will be sent to the patient regarding the next appointment. BI-RADS CATEGORY  1: Negative. Electronically Signed   By: Claudie Revering M.D.   On: 12/21/2017 10:03       Assessment & Plan:   Problem List Items Addressed This Visit    Breast tenderness in female    Previous left breast tenderness.  Mammogram today.  Discussed with her today.  No pain currently.  Desires no further w/up.        Environmental allergies    Has been followed by ENT.  Has been seeing Dr Pryor Ochoa.  Currently doing well.        GERD (gastroesophageal reflux disease)    Symptoms controlled on current regimen.  Follow.        Relevant Orders   CBC with Differential/Platelet   Comprehensive metabolic panel   Health care maintenance    Physical today 12/21/17.  PAP 08/11/15 - negative with negative HPV.  Mammogram 12/21/17.  Colonoscopy 05/19/12 - recommended f/u colonoscopy in 5 years.        Paroxysmal supraventricular tachycardia (HCC)    Previously evaluated by Dr Rockey Situ.  Has propranolol to take prn.  Stable.  Follow.        Relevant Orders   TSH   Stress    Handling stress.  Doing better.  Does not feel needs any further intervention.  Follow.        Other Visit Diagnoses    Routine general medical examination at a health care facility    -  Primary   Screening cholesterol level       Relevant Orders   Lipid panel       Einar Pheasant, MD

## 2017-12-24 ENCOUNTER — Encounter: Payer: Self-pay | Admitting: Internal Medicine

## 2017-12-24 NOTE — Assessment & Plan Note (Signed)
Has been followed by ENT.  Has been seeing Dr Pryor Ochoa.  Currently doing well.

## 2017-12-24 NOTE — Assessment & Plan Note (Signed)
Handling stress.  Doing better.  Does not feel needs any further intervention.  Follow.

## 2017-12-24 NOTE — Assessment & Plan Note (Signed)
Previous left breast tenderness.  Mammogram today.  Discussed with her today.  No pain currently.  Desires no further w/up.

## 2017-12-24 NOTE — Assessment & Plan Note (Signed)
Previously evaluated by Dr Gollan.  Has propranolol to take prn.  Stable.  Follow.   

## 2017-12-24 NOTE — Assessment & Plan Note (Signed)
Symptoms controlled on current regimen.  Follow.   

## 2017-12-26 ENCOUNTER — Encounter: Payer: Self-pay | Admitting: Internal Medicine

## 2017-12-27 ENCOUNTER — Other Ambulatory Visit: Payer: Self-pay

## 2017-12-27 MED ORDER — FLUTICASONE PROPIONATE 50 MCG/ACT NA SUSP: NASAL | 5 refills | Status: DC

## 2017-12-27 MED ORDER — TRAZODONE HCL 50 MG PO TABS: ORAL_TABLET | ORAL | 3 refills | Status: DC

## 2017-12-28 DIAGNOSIS — M25562 Pain in left knee: Secondary | ICD-10-CM | POA: Diagnosis not present

## 2017-12-28 DIAGNOSIS — M76892 Other specified enthesopathies of left lower limb, excluding foot: Secondary | ICD-10-CM | POA: Diagnosis not present

## 2017-12-28 DIAGNOSIS — G8929 Other chronic pain: Secondary | ICD-10-CM | POA: Diagnosis not present

## 2017-12-31 ENCOUNTER — Other Ambulatory Visit: Payer: Self-pay | Admitting: Sports Medicine

## 2017-12-31 DIAGNOSIS — M25562 Pain in left knee: Secondary | ICD-10-CM

## 2018-01-01 ENCOUNTER — Encounter: Payer: Self-pay | Admitting: Internal Medicine

## 2018-01-01 ENCOUNTER — Other Ambulatory Visit (INDEPENDENT_AMBULATORY_CARE_PROVIDER_SITE_OTHER): Payer: BLUE CROSS/BLUE SHIELD

## 2018-01-01 DIAGNOSIS — K219 Gastro-esophageal reflux disease without esophagitis: Secondary | ICD-10-CM | POA: Diagnosis not present

## 2018-01-01 DIAGNOSIS — Z13220 Encounter for screening for lipoid disorders: Secondary | ICD-10-CM | POA: Diagnosis not present

## 2018-01-01 DIAGNOSIS — I471 Supraventricular tachycardia: Secondary | ICD-10-CM | POA: Diagnosis not present

## 2018-01-01 LAB — COMPREHENSIVE METABOLIC PANEL
ALT: 15 U/L (ref 0–35)
AST: 12 U/L (ref 0–37)
Albumin: 4.1 g/dL (ref 3.5–5.2)
Alkaline Phosphatase: 54 U/L (ref 39–117)
BUN: 20 mg/dL (ref 6–23)
CO2: 30 mEq/L (ref 19–32)
Calcium: 9.4 mg/dL (ref 8.4–10.5)
Chloride: 103 mEq/L (ref 96–112)
Creatinine, Ser: 0.84 mg/dL (ref 0.40–1.20)
GFR: 73.28 mL/min (ref >60.00)
Glucose, Bld: 92 mg/dL (ref 70–99)
Potassium: 4 mEq/L (ref 3.5–5.1)
Sodium: 139 mEq/L (ref 135–145)
Total Bilirubin: 0.7 mg/dL (ref 0.2–1.2)
Total Protein: 6.7 g/dL (ref 6.0–8.3)

## 2018-01-01 LAB — CBC WITH DIFFERENTIAL/PLATELET
Basophils Absolute: 0.1 10*3/uL (ref 0.0–0.1)
Basophils Relative: 0.9 % (ref 0.0–3.0)
Eosinophils Absolute: 0.3 10*3/uL (ref 0.0–0.7)
Eosinophils Relative: 5.5 % — ABNORMAL HIGH (ref 0.0–5.0)
HCT: 41.8 % (ref 36.0–46.0)
Hemoglobin: 14.3 g/dL (ref 12.0–15.0)
Lymphocytes Relative: 28.5 % (ref 12.0–46.0)
Lymphs Abs: 1.7 10*3/uL (ref 0.7–4.0)
MCHC: 34.2 g/dL (ref 30.0–36.0)
MCV: 93.9 fl (ref 78.0–100.0)
Monocytes Absolute: 0.5 10*3/uL (ref 0.1–1.0)
Monocytes Relative: 9.3 % (ref 3.0–12.0)
Neutro Abs: 3.3 10*3/uL (ref 1.4–7.7)
Neutrophils Relative %: 55.8 % (ref 43.0–77.0)
Platelets: 220 10*3/uL (ref 150.0–400.0)
RBC: 4.45 Mil/uL (ref 3.87–5.11)
RDW: 12.7 % (ref 11.5–15.5)
WBC: 5.9 10*3/uL (ref 4.0–10.5)

## 2018-01-01 LAB — LIPID PANEL
Cholesterol: 165 mg/dL (ref 0–200)
HDL: 51.7 mg/dL (ref >39.00)
LDL Cholesterol: 96 mg/dL (ref 0–99)
NonHDL: 113.39
Total CHOL/HDL Ratio: 3
Triglycerides: 88 mg/dL (ref 0.0–149.0)
VLDL: 17.6 mg/dL (ref 0.0–40.0)

## 2018-01-01 LAB — TSH: TSH: 3.14 u[IU]/mL (ref 0.35–4.50)

## 2018-01-02 ENCOUNTER — Encounter: Payer: Self-pay | Admitting: Internal Medicine

## 2018-01-02 MED ORDER — NYSTATIN 100000 UNIT/ML MT SUSP: OROMUCOSAL | 0 refills | Status: DC

## 2018-01-02 NOTE — Telephone Encounter (Signed)
Called patient. She has an ulcer in her mouth. She has been getting them more frequently than she used to. The last one cleared up on its own but she was wondering if there was something you could recommend for her to use to help.

## 2018-01-02 NOTE — Telephone Encounter (Signed)
rx sent in for nystatin suspension.   

## 2018-01-04 DIAGNOSIS — M2242 Chondromalacia patellae, left knee: Secondary | ICD-10-CM | POA: Diagnosis not present

## 2018-01-04 DIAGNOSIS — M1712 Unilateral primary osteoarthritis, left knee: Secondary | ICD-10-CM | POA: Diagnosis not present

## 2018-01-04 DIAGNOSIS — M25562 Pain in left knee: Secondary | ICD-10-CM | POA: Diagnosis not present

## 2018-01-04 DIAGNOSIS — S83222A Peripheral tear of medial meniscus, current injury, left knee, initial encounter: Secondary | ICD-10-CM | POA: Diagnosis not present

## 2018-01-13 ENCOUNTER — Ambulatory Visit: Payer: BLUE CROSS/BLUE SHIELD

## 2018-01-15 DIAGNOSIS — G8929 Other chronic pain: Secondary | ICD-10-CM | POA: Diagnosis not present

## 2018-01-15 DIAGNOSIS — M25562 Pain in left knee: Secondary | ICD-10-CM | POA: Diagnosis not present

## 2018-01-21 ENCOUNTER — Telehealth: Payer: Self-pay | Admitting: Internal Medicine

## 2018-01-21 NOTE — Telephone Encounter (Signed)
Patient could not get off work for either one of these appts. She requested something Friday afternoon. I scheduled her with Lauren.

## 2018-01-21 NOTE — Telephone Encounter (Signed)
Patient is having leg cramps every night multiple times a night. Mostly in her left leg. This has been going on for awhile but they are now more frequent and more severe. She was wondering if there is something that can be prescribed to help so she can rest at night. Advised that she may need to come in for appt. She denies any other symptoms.

## 2018-01-21 NOTE — Telephone Encounter (Signed)
Copied from [place] [id number]. Topic: Quick Communication - See Telephone Encounter >> Jan 21, 2018  7:58 AM Alfredia Ferguson R wrote: Patient called in and stated that she wanted to let Dr Nicki Reaper know shes been having cramps in left leg mostly and unable to get sleep she wants to know if Dr Nicki Reaper can give her call and let her know if she can prescribe her something to take for it.

## 2018-01-21 NOTE — Telephone Encounter (Signed)
Request for medication

## 2018-01-21 NOTE — Telephone Encounter (Signed)
I can see her 01/22/18 at 12:00 for 01/24/18 at 12:00 for work in.

## 2018-01-24 ENCOUNTER — Telehealth: Payer: Self-pay

## 2018-01-24 NOTE — Telephone Encounter (Signed)
Patient called wondering if it would be beneficial to come in and see Lauren tomorrow. She thinks that her pain in her leg may be coming from her back/hip. She sees ortho. Advised that she could try to reach out to them to see if they could see her or prescribe something. Advised she could keep appt with Lauren until she heard back from her ortho doctor. Told her to send me a mychart message today to let me know if she would like to keep appt

## 2018-01-24 NOTE — Telephone Encounter (Signed)
Copied from [place] [id number]. Topic: General - Other >> Jan 24, 2018  8:06 AM Keene Breath wrote: Reason for CRM: Patient called to speak with Dr. Bary Leriche nurse before her scheduled appointment with Philis Nettle on 01/25/18.  Patient stated that she might not need to come in and wanted to get the nurse's opinion.

## 2018-01-25 ENCOUNTER — Encounter: Payer: Self-pay | Admitting: Family Medicine

## 2018-01-25 ENCOUNTER — Ambulatory Visit: Payer: BLUE CROSS/BLUE SHIELD | Admitting: Family Medicine

## 2018-01-25 VITALS — BP 120/80 | HR 79 | Temp 98.2°F | Ht 66.0 in | Wt 139.8 lb

## 2018-01-25 DIAGNOSIS — M25562 Pain in left knee: Secondary | ICD-10-CM | POA: Diagnosis not present

## 2018-01-25 DIAGNOSIS — M79605 Pain in left leg: Secondary | ICD-10-CM

## 2018-01-25 DIAGNOSIS — R252 Cramp and spasm: Secondary | ICD-10-CM | POA: Diagnosis not present

## 2018-01-25 DIAGNOSIS — M79662 Pain in left lower leg: Secondary | ICD-10-CM | POA: Diagnosis not present

## 2018-01-25 DIAGNOSIS — M25552 Pain in left hip: Secondary | ICD-10-CM

## 2018-01-25 LAB — COMPREHENSIVE METABOLIC PANEL
AG Ratio: 1.8 (calc) (ref 1.0–2.5)
ALT: 16 U/L (ref 6–29)
AST: 17 U/L (ref 10–35)
Albumin: 4.2 g/dL (ref 3.6–5.1)
Alkaline phosphatase (APISO): 60 U/L (ref 33–130)
BUN: 22 mg/dL (ref 7–25)
CO2: 30 mmol/L (ref 20–32)
Calcium: 9.4 mg/dL (ref 8.6–10.4)
Chloride: 102 mmol/L (ref 98–110)
Creat: 0.8 mg/dL (ref 0.50–0.99)
Globulin: 2.4 g/dL (calc) (ref 1.9–3.7)
Glucose, Bld: 89 mg/dL (ref 65–99)
Potassium: 4.3 mmol/L (ref 3.5–5.3)
Sodium: 138 mmol/L (ref 135–146)
Total Bilirubin: 0.4 mg/dL (ref 0.2–1.2)
Total Protein: 6.6 g/dL (ref 6.1–8.1)

## 2018-01-25 LAB — CBC
HCT: 39.7 % (ref 35.0–45.0)
Hemoglobin: 13.5 g/dL (ref 11.7–15.5)
MCH: 31.6 pg (ref 27.0–33.0)
MCHC: 34 g/dL (ref 32.0–36.0)
MCV: 93 fL (ref 80.0–100.0)
MPV: 10.2 fL (ref 7.5–12.5)
Platelets: 255 10*3/uL (ref 140–400)
RBC: 4.27 10*6/uL (ref 3.80–5.10)
RDW: 11.8 % (ref 11.0–15.0)
WBC: 7.5 10*3/uL (ref 3.8–10.8)

## 2018-01-25 MED ORDER — DICLOFENAC SODIUM 75 MG PO TBEC: 75.0000 mg | DELAYED_RELEASE_TABLET | Freq: Two times a day (BID) | ORAL | 1 refills | Status: DC

## 2018-01-25 NOTE — Patient Instructions (Addendum)
Take diclofenac two times per day to help pain. You can take tylenol with this med as needed.   DO NOT TAKE ibuprofen or aleve with diclofenac - too much of same type of medication

## 2018-01-25 NOTE — Progress Notes (Signed)
Subjective:    Patient ID: Theresa Buck, female    DOB: 1956/01/13, 62 y.o.   MRN: [id number]  HPI  Presents to clinic c/o leg cramps for 8  Months.  Patient states she had a fall in summer 2018, slipped and fell landing flat on her back.  Ever since fall she was having left hip and left knee pain, she has seen orthopedics at [facility] a few times.  She also did physical therapy for left hip and left knee.  She was having cramps in left leg the entire time this was going on, but attributed to the injuries from her fall.  States over the past 2 weeks the cramps in her left leg seem worse especially in left calf at nighttime.  Patient message her orthopedic, who recommended she see PCP to rule out any sort of electrolyte abnormalities and rule out any DVT.  Patient denies any swelling in lower extremities.  Denies any fever or chills.  Denies any skin changes.  Tries to eat a balanced diet and drink plenty of water daily.   Patient Active Problem List   Diagnosis Date Noted  . Hemorrhoids 12/10/2016  . Fullness of breast 08/11/2015  . Abdominal pain, epigastric 02/14/2015  . Left foot pain 08/16/2014  . Health care maintenance 08/16/2014  . Breast tenderness in female 04/06/2013  . Vaginitis 02/09/2013  . Paroxysmal supraventricular tachycardia ([place]) 08/27/2012  . Stress 08/27/2012  . Environmental allergies 04/07/2012  . GERD (gastroesophageal reflux disease) 04/07/2012   Social History   Tobacco Use  . Smoking status: Never Smoker  . Smokeless tobacco: Never Used  Substance Use Topics  . Alcohol use: No    Alcohol/week: 0.0 standard drinks   Review of Systems  Constitutional: Negative for chills, fatigue and fever.  HENT: Negative for congestion, ear pain, sinus pain and sore throat.   Eyes: Negative.   Respiratory: Negative for cough, shortness of breath and wheezing.   Cardiovascular: Negative for chest pain, palpitations and leg swelling.  Gastrointestinal:  Negative for abdominal pain, diarrhea, nausea and vomiting.  Genitourinary: Negative for dysuria, frequency and urgency.  Musculoskeletal: +leg cramps, left Skin: Negative for color change, pallor and rash.  Neurological: Negative for syncope, light-headedness and headaches.  Psychiatric/Behavioral: The patient is not nervous/anxious.       Objective:   Physical Exam  Constitutional: She is oriented to person, place, and time. She appears well-nourished. No distress.  HENT:  Head: Normocephalic and atraumatic.  Eyes: EOM are normal. No scleral icterus.  Neck: Normal range of motion. Neck supple. No tracheal deviation present.  Cardiovascular: Normal rate and regular rhythm.  Musculoskeletal: Normal range of motion. She exhibits tenderness. She exhibits no edema or deformity.  Patient reports a chronic ache in left hip and left knee.  Range of motion of left hip intact, no issues with left leg raise, abduction or abduction of left hip.  Able to fully bend and extend left knee without any issues.  Negative anterior and posterior drawer tests of left knee.  No lower extremity swelling.  No noticeable size difference in calves.  No tenderness of calf muscles when area palpated.  Neurological: She is alert and oriented to person, place, and time. No cranial nerve deficit.  Skin: Skin is warm and dry. No rash noted. No erythema. No pallor.  Soft lump felt in left upper thigh, approximately size of a grape.  Patient states this area has been there for many years.  Psychiatric:  She has a normal mood and affect. Her behavior is normal.  Nursing note and vitals reviewed.     Vitals:   01/25/18 1513  BP: 120/80  Pulse: 79  Temp: 98.2 F (36.8 C)  SpO2: 97%   Assessment & Plan:   Left leg pain/left calf pain/muscle cramps- we will get CBC and CMP to rule out any anemia and electrolyte abnormalities.  We will also get Doppler to rule out DVT.  Discussed strategies to help muscle cramps  including:  Stay well-hydrated. Drink enough fluid to keep your urine clear or pale yellow.  Try massaging, stretching, and relaxing the affected muscle. Do this for several minutes at a time.  For tight or tense muscles, use a warm towel, heating pad, or hot shower water directed to the affected area.  If you are sore or have pain after a cramp, applying ice to the affected area may relieve discomfort. ? Put ice in a plastic bag. ? Place a towel between your skin and the bag. ? Leave the ice on for 20 minutes, 2-3 times per day.  Avoid strenuous exercise for several days if you have been having frequent leg cramps.  Make sure that your diet includes the essential minerals for your muscles to work normally.  Left knee pain/left hip pain -- She will try diclofenac to help pain. If electrolyte abnormalities, anemia and DVT are all ruled out it is possible that the left leg pain/cramping is related to an orthopedic issue.   Keep regularly scheduled follow-up as planned.  Patient is aware she may need to follow back up with orthopedics as well if pain persists.

## 2018-01-28 ENCOUNTER — Encounter: Payer: Self-pay | Admitting: Internal Medicine

## 2018-02-01 DIAGNOSIS — M5136 Other intervertebral disc degeneration, lumbar region: Secondary | ICD-10-CM | POA: Diagnosis not present

## 2018-02-01 DIAGNOSIS — M5442 Lumbago with sciatica, left side: Secondary | ICD-10-CM | POA: Diagnosis not present

## 2018-02-01 DIAGNOSIS — M5489 Other dorsalgia: Secondary | ICD-10-CM | POA: Diagnosis not present

## 2018-02-01 DIAGNOSIS — M419 Scoliosis, unspecified: Secondary | ICD-10-CM | POA: Diagnosis not present

## 2018-02-01 DIAGNOSIS — M47816 Spondylosis without myelopathy or radiculopathy, lumbar region: Secondary | ICD-10-CM | POA: Diagnosis not present

## 2018-02-08 NOTE — Telephone Encounter (Signed)
See other mychart message.

## 2018-03-15 DIAGNOSIS — M419 Scoliosis, unspecified: Secondary | ICD-10-CM | POA: Diagnosis not present

## 2018-03-15 DIAGNOSIS — M533 Sacrococcygeal disorders, not elsewhere classified: Secondary | ICD-10-CM | POA: Diagnosis not present

## 2018-03-15 DIAGNOSIS — M5136 Other intervertebral disc degeneration, lumbar region: Secondary | ICD-10-CM | POA: Diagnosis not present

## 2018-03-15 DIAGNOSIS — M47816 Spondylosis without myelopathy or radiculopathy, lumbar region: Secondary | ICD-10-CM | POA: Diagnosis not present

## 2018-05-28 DIAGNOSIS — N951 Menopausal and female climacteric states: Secondary | ICD-10-CM | POA: Diagnosis not present

## 2018-06-13 DIAGNOSIS — H5213 Myopia, bilateral: Secondary | ICD-10-CM | POA: Diagnosis not present

## 2018-06-27 ENCOUNTER — Ambulatory Visit: Payer: BLUE CROSS/BLUE SHIELD | Admitting: Internal Medicine

## 2018-06-28 ENCOUNTER — Ambulatory Visit: Payer: BLUE CROSS/BLUE SHIELD | Admitting: Internal Medicine

## 2018-06-28 ENCOUNTER — Encounter: Payer: Self-pay | Admitting: Internal Medicine

## 2018-06-28 DIAGNOSIS — K219 Gastro-esophageal reflux disease without esophagitis: Secondary | ICD-10-CM | POA: Diagnosis not present

## 2018-06-28 DIAGNOSIS — K649 Unspecified hemorrhoids: Secondary | ICD-10-CM | POA: Diagnosis not present

## 2018-06-28 DIAGNOSIS — F439 Reaction to severe stress, unspecified: Secondary | ICD-10-CM

## 2018-06-28 DIAGNOSIS — I471 Supraventricular tachycardia: Secondary | ICD-10-CM | POA: Diagnosis not present

## 2018-06-28 DIAGNOSIS — H9190 Unspecified hearing loss, unspecified ear: Secondary | ICD-10-CM

## 2018-06-28 NOTE — Progress Notes (Signed)
Patient ID: Theresa Buck, female   DOB: Feb 01, 1956, 63 y.o.   MRN: [id number]   Subjective:    Patient ID: Theresa Buck, female    DOB: 09-27-1955, 63 y.o.   MRN: [id number]  HPI  Patient here for a scheduled follow up.  She reports she is doing relatively well.  Has adjusted her diet.  Trying to lose weight.  Has lost weight.  Discussed norma BMI.  No chest pain.  No sob.  No acid reflux.  No abdominal pain.  Bowels moving.  Some increased stress with family health issues.  Discussed with her today.  Overall she feels she is handling things relatively well.  Does not feel needs any further intervention.  Has seen ortho for her knee.  Discussed continued f/u with ortho.  Does report some hearing decrease.  Discussed referral back to ENT for evaluation.     Past Medical History:  Diagnosis Date  . Allergy    recurring sinus problems  . Anxiety   . Arthritis   . Deviated septum   . Deviated septum   . GERD (gastroesophageal reflux disease)   . H/O sinusitis   . Headache    H/O MIGRAINES  . Hx of degenerative disc disease   . SVT (supraventricular tachycardia) (HCC)    Past Surgical History:  Procedure Laterality Date  . BACK SURGERY  09/26/2013   LUMBAR  . COLONOSCOPY  08/03/2015   Dr Vira Agar  . HEMORRHOID SURGERY N/A 03/13/2017   Procedure: HEMORRHOIDECTOMY;  Surgeon: Christene Lye, MD;  Location: [facility] ORS;  Service: General;  Laterality: N/A;  . UPPER GI ENDOSCOPY  08/03/2015   Dr Vira Agar   Family History  Problem Relation Age of Onset  . Brain cancer Mother   . ALS Father   . Diabetes Brother        multiple  . Heart disease Son        myocardial infarction, s/p CABG  . Throat cancer Brother    Social History   Socioeconomic History  . Marital status: Widowed    Spouse name: Not on file  . Number of children: 3  . Years of education: Not on file  . Highest education level: Not on file  Occupational History  . Not on file  Social Needs  .  Financial resource strain: Not on file  . Food insecurity:    Worry: Not on file    Inability: Not on file  . Transportation needs:    Medical: Not on file    Non-medical: Not on file  Tobacco Use  . Smoking status: Never Smoker  . Smokeless tobacco: Never Used  Substance and Sexual Activity  . Alcohol use: No    Alcohol/week: 0.0 standard drinks  . Drug use: No  . Sexual activity: Not on file  Lifestyle  . Physical activity:    Days per week: Not on file    Minutes per session: Not on file  . Stress: Not on file  Relationships  . Social connections:    Talks on phone: Not on file    Gets together: Not on file    Attends religious service: Not on file    Active member of club or organization: Not on file    Attends meetings of clubs or organizations: Not on file    Relationship status: Not on file  Other Topics Concern  . Not on file  Social History Narrative  . Not on file  Outpatient Encounter Medications as of 06/28/2018  Medication Sig  . Artificial Tear Solution (GENTEAL TEARS) 0.1-0.2-0.3 % SOLN Place 1 drop 3 times/day as needed-between meals & bedtime into both eyes (dry eyes).  . Biotin 5000 MCG TABS Take 1 tablet daily by mouth.   . Calcium Carb-Cholecalciferol (CALCIUM + D3 PO) Take 1 tablet daily by mouth.  . estradiol (ESTRACE) 0.1 MG/GM vaginal cream Place 1 Applicatorful at bedtime vaginally.  Marland Kitchen estradiol (ESTRACE) 1 MG tablet Take 0.5 mg daily by mouth.   . fluticasone (FLONASE) 50 MCG/ACT nasal spray instill 2 spray into each nostril once daily  . montelukast (SINGULAIR) 10 MG tablet Take 10 mg at bedtime by mouth.   . Multiple Vitamin (MULTIVITAMIN) tablet Take 1 tablet by mouth daily.  Marland Kitchen OVER THE COUNTER MEDICATION Take 1 capsule 2 (two) times daily by mouth. XEO Omega  . OVER THE COUNTER MEDICATION Take 1 capsule 2 (two) times daily by mouth. Micro Plex VM  . OVER THE COUNTER MEDICATION Take 1 capsule 2 (two) times daily by mouth. Alpha CRS  .  pantoprazole (PROTONIX) 40 MG tablet TAKE 1 TABLET BY MOUTH ONCE DAILY  . Probiotic Product (PROBIOTIC ADVANCED PO) Take 1 capsule daily by mouth.   . progesterone (PROMETRIUM) 100 MG capsule Take 100 mg daily by mouth.  . propranolol (INDERAL) 20 MG tablet Take 1 tablet (20 mg total) by mouth 3 (three) times daily as needed.  . traZODone (DESYREL) 50 MG tablet take 1/2 to 1 tablets by mouth at bedtime if needed for sleep  . [DISCONTINUED] diclofenac (VOLTAREN) 75 MG EC tablet Take 75 mg by mouth 2 (two) times daily.  . [DISCONTINUED] diclofenac (VOLTAREN) 75 MG EC tablet Take 1 tablet (75 mg total) by mouth 2 (two) times daily.  . [DISCONTINUED] nystatin (MYCOSTATIN) 100000 UNIT/ML suspension 5cc's rinse mouth and spit tid   No facility-administered encounter medications on file as of 06/28/2018.     Review of Systems  Constitutional: Negative for appetite change and unexpected weight change.  HENT: Positive for hearing loss. Negative for congestion and sinus pressure.   Respiratory: Negative for cough, chest tightness and shortness of breath.   Cardiovascular: Negative for chest pain, palpitations and leg swelling.  Gastrointestinal: Negative for abdominal pain, diarrhea, nausea and vomiting.  Genitourinary: Negative for difficulty urinating and dysuria.  Musculoskeletal: Negative for arthralgias and myalgias.  Skin: Negative for color change and rash.  Neurological: Negative for dizziness, light-headedness and headaches.  Psychiatric/Behavioral: Negative for agitation and dysphoric mood.       Objective:    Physical Exam Constitutional:      General: She is not in acute distress.    Appearance: Normal appearance.  HENT:     Right Ear: Ear canal normal. There is no impacted cerumen.     Left Ear: Ear canal normal. There is no impacted cerumen.     Nose: Nose normal. No congestion.     Mouth/Throat:     Pharynx: No oropharyngeal exudate or posterior oropharyngeal erythema.  Neck:      Musculoskeletal: Neck supple. No muscular tenderness.     Thyroid: No thyromegaly.  Cardiovascular:     Rate and Rhythm: Normal rate and regular rhythm.  Pulmonary:     Effort: No respiratory distress.     Breath sounds: Normal breath sounds. No wheezing.  Abdominal:     General: Bowel sounds are normal.     Palpations: Abdomen is soft.     Tenderness: There is  no abdominal tenderness.  Musculoskeletal:        General: No swelling or tenderness.  Lymphadenopathy:     Cervical: No cervical adenopathy.  Skin:    Findings: No erythema or rash.  Neurological:     Mental Status: She is alert.  Psychiatric:        Mood and Affect: Mood normal.        Behavior: Behavior normal.     BP 114/70   Pulse 74   Temp (!) 97.5 F (36.4 C) (Oral)   Resp 16   Wt 136 lb (61.7 kg)   LMP 07/15/2012   SpO2 97%   BMI 21.95 kg/m  Wt Readings from Last 3 Encounters:  06/28/18 136 lb (61.7 kg)  01/25/18 139 lb 12.8 oz (63.4 kg)  12/21/17 139 lb 3.2 oz (63.1 kg)     Lab Results  Component Value Date   WBC 7.5 01/25/2018   HGB 13.5 01/25/2018   HCT 39.7 01/25/2018   PLT 255 01/25/2018   GLUCOSE 89 01/25/2018   CHOL 165 01/01/2018   TRIG 88.0 01/01/2018   HDL 51.70 01/01/2018   LDLCALC 96 01/01/2018   ALT 16 01/25/2018   AST 17 01/25/2018   NA 138 01/25/2018   K 4.3 01/25/2018   CL 102 01/25/2018   CREATININE 0.80 01/25/2018   BUN 22 01/25/2018   CO2 30 01/25/2018   TSH 3.14 01/01/2018    US Breast Ltd Uni Left Inc Axilla  Result Date: 12/21/2017 CLINICAL DATA:  Left breast tenderness in the 4-6 o'clock region.Previously demonstrated cyst in the 2 o'clock position of the left breast. EXAM: DIGITAL DIAGNOSTIC BILATERAL MAMMOGRAM WITH CAD AND TOMO ULTRASOUND LEFT BREAST COMPARISON:  Previous exam(s). ACR Breast Density Category d: The breast tissue is extremely dense, which lowers the sensitivity of mammography. FINDINGS: Stable mammographic appearance of the breasts with no  findings suspicious for malignancy in either breast. Mammographic images were processed with CAD. On physical exam, the patient is tender to palpation in the 5 o'clock position of the left breast. There is no palpable mass in the areas of tenderness. Targeted ultrasound is performed, showing normal appearing breast tissue throughout the inferior left breast in the areas of tenderness. IMPRESSION: No evidence of malignancy. RECOMMENDATION: Bilateral screening mammogram in 1 year. I have discussed the findings and recommendations with the patient. Results were also provided in writing at the conclusion of the visit. If applicable, a reminder letter will be sent to the patient regarding the next appointment. BI-RADS CATEGORY  1: Negative. Electronically Signed   By: Claudie Revering M.D.   On: 12/21/2017 10:03   Mm Diag Breast Tomo Bilateral  Result Date: 12/21/2017 CLINICAL DATA:  Left breast tenderness in the 4-6 o'clock region.Previously demonstrated cyst in the 2 o'clock position of the left breast. EXAM: DIGITAL DIAGNOSTIC BILATERAL MAMMOGRAM WITH CAD AND TOMO ULTRASOUND LEFT BREAST COMPARISON:  Previous exam(s). ACR Breast Density Category d: The breast tissue is extremely dense, which lowers the sensitivity of mammography. FINDINGS: Stable mammographic appearance of the breasts with no findings suspicious for malignancy in either breast. Mammographic images were processed with CAD. On physical exam, the patient is tender to palpation in the 5 o'clock position of the left breast. There is no palpable mass in the areas of tenderness. Targeted ultrasound is performed, showing normal appearing breast tissue throughout the inferior left breast in the areas of tenderness. IMPRESSION: No evidence of malignancy. RECOMMENDATION: Bilateral screening mammogram in 1 year. I have  discussed the findings and recommendations with the patient. Results were also provided in writing at the conclusion of the visit. If applicable, a  reminder letter will be sent to the patient regarding the next appointment. BI-RADS CATEGORY  1: Negative. Electronically Signed   By: Claudie Revering M.D.   On: 12/21/2017 10:03       Assessment & Plan:   Problem List Items Addressed This Visit    Gastro-esophageal reflux disease without esophagitis    Controlled.        Hearing loss    Reports noticing some hearing loss as outlined.  No pain.  Refer to ENT for evaluation.        Relevant Orders   Ambulatory referral to ENT   Hemorrhoids    S/p hemorrhoid procedure by Dr Jamal Collin.  Did well for a while.  Had flare recently.  No bleeding now.  Declines further evaluation at this time.  Declined rectal exam. Wants to monitor.        Stress    Increased stress as outlined.  Discussed with her today.  She feels she is handling things relatively well.  Does not feel needs any further intervention.  Follow.        Supraventricular tachycardia (HCC)    Previously evaluated by Dr Rockey Situ.  Has propranolol to take prn.  Stable.  Follow.            Einar Pheasant, MD

## 2018-06-30 ENCOUNTER — Encounter: Payer: Self-pay | Admitting: Internal Medicine

## 2018-06-30 DIAGNOSIS — H9190 Unspecified hearing loss, unspecified ear: Secondary | ICD-10-CM | POA: Insufficient documentation

## 2018-06-30 NOTE — Assessment & Plan Note (Signed)
Previously evaluated by Dr Rockey Situ.  Has propranolol to take prn.  Stable.  Follow.

## 2018-06-30 NOTE — Assessment & Plan Note (Signed)
Increased stress as outlined.  Discussed with her today.  She feels she is handling things relatively well. Does not feel needs any further intervention.  Follow.   

## 2018-06-30 NOTE — Assessment & Plan Note (Signed)
Reports noticing some hearing loss as outlined.  No pain.  Refer to ENT for evaluation.

## 2018-06-30 NOTE — Assessment & Plan Note (Signed)
S/p hemorrhoid procedure by Dr Jamal Collin.  Did well for a while.  Had flare recently.  No bleeding now.  Declines further evaluation at this time.  Declined rectal exam. Wants to monitor.

## 2018-06-30 NOTE — Assessment & Plan Note (Signed)
Controlled.  

## 2018-10-25 ENCOUNTER — Ambulatory Visit: Payer: Self-pay

## 2018-10-25 NOTE — Telephone Encounter (Signed)
Patient called and says she's been having chest pain on the left side of her chest off and on since Wednesday evening and now her chest is tight and she feels the pain on the right side. She says she has sinus congestion and wonders if the congestion has gone to her chest. She denies cough, fever, denies other symptoms, denies pain radiation. I advised to go to the ED, care advice given, she verbalized understanding.  Reason for Disposition . Chest pain lasts > 5 minutes (Exceptions: chest pain occurring > 3 days ago and now asymptomatic; same as previously diagnosed heartburn and has accompanying sour taste in mouth)  Answer Assessment - Initial Assessment Questions 1. LOCATION: "Where does it hurt?"       Left side at times, now a tightness 2. RADIATION: "Does the pain go anywhere else?" (e.g., into neck, jaw, arms, back)     No 3. ONSET: "When did the chest pain begin?" (Minutes, hours or days)      Wednesday evening 4. PATTERN "Does the pain come and go, or has it been constant since it started?"  "Does it get worse with exertion?"      Comes and goes; not worse with movement 5. DURATION: "How long does it last" (e.g., seconds, minutes, hours)     Constant 6. SEVERITY: "How bad is the pain?"  (e.g., Scale 1-10; mild, moderate, or severe)    - MILD (1-3): doesn't interfere with normal activities     - MODERATE (4-7): interferes with normal activities or awakens from sleep    - SEVERE (8-10): excruciating pain, unable to do any normal activities       5 7. CARDIAC RISK FACTORS: "Do you have any history of heart problems or risk factors for heart disease?" (e.g., prior heart attack, angina; high blood pressure, diabetes, being overweight, high cholesterol, smoking, or strong family history of heart disease)     No 8. PULMONARY RISK FACTORS: "Do you have any history of lung disease?"  (e.g., blood clots in lung, asthma, emphysema, birth control pills)     Hormone pills 9. CAUSE: "What do you  think is causing the chest pain?"     No 10. OTHER SYMPTOMS: "Do you have any other symptoms?" (e.g., dizziness, nausea, vomiting, sweating, fever, difficulty breathing, cough)      Sinus congestion, chest tightness takes your breath; no difficulty breathing 11. PREGNANCY: "Is there any chance you are pregnant?" "When was your last menstrual period?"      No  Protocols used: CHEST PAIN-A-AH

## 2018-10-28 NOTE — Telephone Encounter (Signed)
Called and spoke to patient.  Patient did not go to the ED and said that she felt that it was anxiety.  Patient denies having any cardiac symptoms today.  Patient said that she thinks that she is ok now and feels it may also be the hormone therapy that she is currently taking.   Patient also stated that she has been having breast pain on the left side and inquired when her next mammogram would be.  Offered patient an appointment for tomorrow with provider.  Pt declined and said that she had to work.  Pt asked about an in-person appointment but said that she could not come into the office this week because of work.  Advised patient to go to ED if cardiac symptoms reoccur.  Patient said that she would wait to hear back from PCP to be advised on scheduling an appointment.

## 2018-10-28 NOTE — Telephone Encounter (Signed)
LMTCB

## 2018-10-28 NOTE — Telephone Encounter (Signed)
Please call pt and confirm doing ok.  Sounds like she is not having any chest pain now.  If any acute symptoms, needs to be evaluated.  I can schedule her for doxy appt at end of day if this helps with her work schedule.  Can schedule at 4:30 today or tomorrow pm.

## 2018-10-30 NOTE — Telephone Encounter (Signed)
Patient confirmed that she is doing ok. She has not had anymore symptoms since 7/3. She would like to just continue to monitor. Patient stated she would go to the hospital if symptoms return. Pt declined need for appt at this time.

## 2018-11-07 ENCOUNTER — Encounter: Payer: Self-pay | Admitting: Internal Medicine

## 2018-11-07 ENCOUNTER — Other Ambulatory Visit: Payer: Self-pay | Admitting: Internal Medicine

## 2018-11-13 NOTE — Telephone Encounter (Signed)
Spoke with pt and offered virtual visit. Pt is under some increased stress right now. Son is in emergency surgery, significant other had surgery last week. Pt is going to call me back about scheduling a virtual visit to discuss anxiety.

## 2018-11-14 DIAGNOSIS — I788 Other diseases of capillaries: Secondary | ICD-10-CM | POA: Diagnosis not present

## 2018-11-14 DIAGNOSIS — D224 Melanocytic nevi of scalp and neck: Secondary | ICD-10-CM | POA: Diagnosis not present

## 2018-11-14 DIAGNOSIS — L812 Freckles: Secondary | ICD-10-CM | POA: Diagnosis not present

## 2018-11-14 DIAGNOSIS — L820 Inflamed seborrheic keratosis: Secondary | ICD-10-CM | POA: Diagnosis not present

## 2018-12-10 ENCOUNTER — Other Ambulatory Visit: Payer: Self-pay | Admitting: Internal Medicine

## 2018-12-11 ENCOUNTER — Other Ambulatory Visit: Payer: Self-pay | Admitting: Internal Medicine

## 2018-12-17 DIAGNOSIS — H6063 Unspecified chronic otitis externa, bilateral: Secondary | ICD-10-CM | POA: Diagnosis not present

## 2018-12-17 DIAGNOSIS — H93291 Other abnormal auditory perceptions, right ear: Secondary | ICD-10-CM | POA: Diagnosis not present

## 2018-12-17 DIAGNOSIS — H93299 Other abnormal auditory perceptions, unspecified ear: Secondary | ICD-10-CM | POA: Diagnosis not present

## 2018-12-18 ENCOUNTER — Encounter: Payer: Self-pay | Admitting: Internal Medicine

## 2018-12-18 DIAGNOSIS — M542 Cervicalgia: Secondary | ICD-10-CM

## 2018-12-19 NOTE — Telephone Encounter (Signed)
Pt would like a referral to ortho for her neck pain. She has seen Dr Candelaria Stagers in the past.

## 2018-12-20 NOTE — Telephone Encounter (Signed)
Order placed for ortho referral.   

## 2018-12-25 NOTE — Telephone Encounter (Signed)
Talked to pt. She has another appt with ortho at 1:30. Advised that she should be able to make both appts.

## 2018-12-27 ENCOUNTER — Other Ambulatory Visit: Payer: Self-pay

## 2018-12-27 ENCOUNTER — Other Ambulatory Visit (HOSPITAL_COMMUNITY)
Admission: RE | Admit: 2018-12-27 | Discharge: 2018-12-27 | Disposition: A | Discharge status: Home / Self Care | Payer: BC Managed Care – PPO | Source: Ambulatory Visit | Attending: Internal Medicine | Admitting: Internal Medicine

## 2018-12-27 ENCOUNTER — Ambulatory Visit (INDEPENDENT_AMBULATORY_CARE_PROVIDER_SITE_OTHER): Payer: BC Managed Care – PPO | Admitting: Internal Medicine

## 2018-12-27 VITALS — BP 104/70 | HR 65 | Temp 98.0°F | Resp 16 | Ht 66.0 in | Wt 137.6 lb

## 2018-12-27 DIAGNOSIS — H9190 Unspecified hearing loss, unspecified ear: Secondary | ICD-10-CM

## 2018-12-27 DIAGNOSIS — Z124 Encounter for screening for malignant neoplasm of cervix: Secondary | ICD-10-CM

## 2018-12-27 DIAGNOSIS — F439 Reaction to severe stress, unspecified: Secondary | ICD-10-CM

## 2018-12-27 DIAGNOSIS — Z0000 Encounter for general adult medical examination without abnormal findings: Secondary | ICD-10-CM | POA: Diagnosis not present

## 2018-12-27 DIAGNOSIS — Z1239 Encounter for other screening for malignant neoplasm of breast: Secondary | ICD-10-CM | POA: Diagnosis not present

## 2018-12-27 DIAGNOSIS — I471 Supraventricular tachycardia: Secondary | ICD-10-CM

## 2018-12-27 DIAGNOSIS — K219 Gastro-esophageal reflux disease without esophagitis: Secondary | ICD-10-CM

## 2018-12-27 DIAGNOSIS — M47812 Spondylosis without myelopathy or radiculopathy, cervical region: Secondary | ICD-10-CM | POA: Diagnosis not present

## 2018-12-27 DIAGNOSIS — M5030 Other cervical disc degeneration, unspecified cervical region: Secondary | ICD-10-CM | POA: Diagnosis not present

## 2018-12-27 DIAGNOSIS — Z13220 Encounter for screening for lipoid disorders: Secondary | ICD-10-CM

## 2018-12-27 DIAGNOSIS — M542 Cervicalgia: Secondary | ICD-10-CM | POA: Diagnosis not present

## 2018-12-27 NOTE — Progress Notes (Signed)
Patient ID: Theresa Buck, female   DOB: 1955-09-13, 63 y.o.   MRN: [id number]   Subjective:    Patient ID: Theresa Buck, female    DOB: 1955/12/26, 63 y.o.   MRN: [id number]  HPI  Patient here for her physical exam.  States she is doing relatively well.  Has been having issues with neck pain.  Saw ortho.  Muscle relaxer prescribed.  Increased stress.  Son had emergent surgery. Has a history of bypass.  Is doing better.  Discussed the increased stress with her.  Overall she feels she is handling things relatively well.  Tries to stay active.  No chest pain.  No sob. No acid reflux.  No abdominal pain.  Bowels moving.     Past Medical History:  Diagnosis Date   Allergy    recurring sinus problems   Anxiety    Arthritis    Deviated septum    Deviated septum    GERD (gastroesophageal reflux disease)    H/O sinusitis    Headache    H/O MIGRAINES   Hx of degenerative disc disease    SVT (supraventricular tachycardia) (HCC)    Past Surgical History:  Procedure Laterality Date   BACK SURGERY  09/26/2013   LUMBAR   COLONOSCOPY  08/03/2015   Dr Vira Agar   HEMORRHOID SURGERY N/A 03/13/2017   Procedure: HEMORRHOIDECTOMY;  Surgeon: Christene Lye, MD;  Location: [facility] ORS;  Service: General;  Laterality: N/A;   UPPER GI ENDOSCOPY  08/03/2015   Dr Vira Agar   Family History  Problem Relation Age of Onset   Brain cancer Mother    ALS Father    Diabetes Brother        multiple   Heart disease Son        myocardial infarction, s/p CABG   Throat cancer Brother    Social History   Socioeconomic History   Marital status: Widowed    Spouse name: Not on file   Number of children: 3   Years of education: Not on file   Highest education level: Not on file  Occupational History   Not on file  Social Needs   Financial resource strain: Not on file   Food insecurity    Worry: Not on file    Inability: Not on file   Transportation needs    Medical:  Not on file    Non-medical: Not on file  Tobacco Use   Smoking status: Never Smoker   Smokeless tobacco: Never Used  Substance and Sexual Activity   Alcohol use: No    Alcohol/week: 0.0 standard drinks   Drug use: No   Sexual activity: Not on file  Lifestyle   Physical activity    Days per week: Not on file    Minutes per session: Not on file   Stress: Not on file  Relationships   Social connections    Talks on phone: Not on file    Gets together: Not on file    Attends religious service: Not on file    Active member of club or organization: Not on file    Attends meetings of clubs or organizations: Not on file    Relationship status: Not on file  Other Topics Concern   Not on file  Social History Narrative   Not on file    Outpatient Encounter Medications as of 12/27/2018  Medication Sig   Artificial Tear Solution (GENTEAL TEARS) 0.1-0.2-0.3 % SOLN Place 1 drop 3 times/day as  needed-between meals & bedtime into both eyes (dry eyes).   Biotin 5000 MCG TABS Take 1 tablet daily by mouth.    Calcium Carb-Cholecalciferol (CALCIUM + D3 PO) Take 1 tablet daily by mouth.   estradiol (ESTRACE) 0.1 MG/GM vaginal cream Place 1 Applicatorful at bedtime vaginally.   estradiol (ESTRACE) 1 MG tablet Take 0.5 mg daily by mouth.    fluticasone (FLONASE) 50 MCG/ACT nasal spray SHAKE LIQUID AND USE 2 SPRAYS IN EACH NOSTRIL ONCE DAILY   montelukast (SINGULAIR) 10 MG tablet Take 10 mg at bedtime by mouth.    Multiple Vitamin (MULTIVITAMIN) tablet Take 1 tablet by mouth daily.   OVER THE COUNTER MEDICATION Take 1 capsule 2 (two) times daily by mouth. XEO Omega   OVER THE COUNTER MEDICATION Take 1 capsule 2 (two) times daily by mouth. Micro Plex VM   OVER THE COUNTER MEDICATION Take 1 capsule 2 (two) times daily by mouth. Alpha CRS   pantoprazole (PROTONIX) 40 MG tablet TAKE 1 TABLET BY MOUTH EVERY DAY   Probiotic Product (PROBIOTIC ADVANCED PO) Take 1 capsule daily by  mouth.    progesterone (PROMETRIUM) 100 MG capsule Take 100 mg daily by mouth.   propranolol (INDERAL) 20 MG tablet Take 1 tablet (20 mg total) by mouth 3 (three) times daily as needed.   traZODone (DESYREL) 50 MG tablet take 1/2 to 1 tablets by mouth at bedtime if needed for sleep   No facility-administered encounter medications on file as of 12/27/2018.     Review of Systems  Constitutional: Negative for appetite change and unexpected weight change.  HENT: Negative for congestion and sinus pressure.   Eyes: Negative for pain and visual disturbance.  Respiratory: Negative for cough, chest tightness and shortness of breath.   Cardiovascular: Negative for chest pain, palpitations and leg swelling.  Gastrointestinal: Negative for abdominal pain, diarrhea, nausea and vomiting.  Genitourinary: Negative for difficulty urinating and dysuria.  Musculoskeletal: Negative for joint swelling and myalgias.  Skin: Negative for color change and rash.  Neurological: Negative for dizziness, light-headedness and headaches.  Hematological: Negative for adenopathy. Does not bruise/bleed easily.  Psychiatric/Behavioral: Negative for agitation and dysphoric mood.       Objective:    Physical Exam Constitutional:      General: She is not in acute distress.    Appearance: Normal appearance. She is well-developed.  HENT:     Right Ear: External ear normal.     Left Ear: External ear normal.  Eyes:     General: No scleral icterus.       Right eye: No discharge.        Left eye: No discharge.     Conjunctiva/sclera: Conjunctivae normal.  Neck:     Musculoskeletal: Neck supple. No muscular tenderness.     Thyroid: No thyromegaly.  Cardiovascular:     Rate and Rhythm: Normal rate and regular rhythm.  Pulmonary:     Effort: No tachypnea, accessory muscle usage or respiratory distress.     Breath sounds: Normal breath sounds. No decreased breath sounds or wheezing.  Chest:     Breasts:         Right: No inverted nipple, mass, nipple discharge or tenderness (no axillary adenopathy).        Left: No inverted nipple, mass, nipple discharge or tenderness (no axilarry adenopathy).  Abdominal:     General: Bowel sounds are normal.     Palpations: Abdomen is soft.     Tenderness: There is no abdominal  tenderness.  Genitourinary:    Comments: Normal external genitalia.  Vaginal vault without lesions.  Cervix identified.  Pap smear performed.  Could not appreciate any adnexal masses or tenderness.   Musculoskeletal:        General: No swelling or tenderness.  Lymphadenopathy:     Cervical: No cervical adenopathy.  Skin:    Findings: No erythema or rash.  Neurological:     Mental Status: She is alert and oriented to person, place, and time.  Psychiatric:        Mood and Affect: Mood normal.        Behavior: Behavior normal.     BP 104/70    Pulse 65    Temp 98 F (36.7 C) (Temporal)    Resp 16    Ht 5\' 6"  (1.676 m)    Wt 137 lb 9.6 oz (62.4 kg)    LMP 07/15/2012    SpO2 97%    BMI 22.21 kg/m  Wt Readings from Last 3 Encounters:  12/27/18 137 lb 9.6 oz (62.4 kg)  06/28/18 136 lb (61.7 kg)  01/25/18 139 lb 12.8 oz (63.4 kg)     Lab Results  Component Value Date   WBC 7.5 01/25/2018   HGB 13.5 01/25/2018   HCT 39.7 01/25/2018   PLT 255 01/25/2018   GLUCOSE 89 01/25/2018   CHOL 165 01/01/2018   TRIG 88.0 01/01/2018   HDL 51.70 01/01/2018   LDLCALC 96 01/01/2018   ALT 16 01/25/2018   AST 17 01/25/2018   NA 138 01/25/2018   K 4.3 01/25/2018   CL 102 01/25/2018   CREATININE 0.80 01/25/2018   BUN 22 01/25/2018   CO2 30 01/25/2018   TSH 3.14 01/01/2018    US Breast Ltd Uni Left Inc Axilla  Result Date: 12/21/2017 CLINICAL DATA:  Left breast tenderness in the 4-6 o'clock region.Previously demonstrated cyst in the 2 o'clock position of the left breast. EXAM: DIGITAL DIAGNOSTIC BILATERAL MAMMOGRAM WITH CAD AND TOMO ULTRASOUND LEFT BREAST COMPARISON:  Previous exam(s). ACR  Breast Density Category d: The breast tissue is extremely dense, which lowers the sensitivity of mammography. FINDINGS: Stable mammographic appearance of the breasts with no findings suspicious for malignancy in either breast. Mammographic images were processed with CAD. On physical exam, the patient is tender to palpation in the 5 o'clock position of the left breast. There is no palpable mass in the areas of tenderness. Targeted ultrasound is performed, showing normal appearing breast tissue throughout the inferior left breast in the areas of tenderness. IMPRESSION: No evidence of malignancy. RECOMMENDATION: Bilateral screening mammogram in 1 year. I have discussed the findings and recommendations with the patient. Results were also provided in writing at the conclusion of the visit. If applicable, a reminder letter will be sent to the patient regarding the next appointment. BI-RADS CATEGORY  1: Negative. Electronically Signed   By: Claudie Revering M.D.   On: 12/21/2017 10:03   Mm Diag Breast Tomo Bilateral  Result Date: 12/21/2017 CLINICAL DATA:  Left breast tenderness in the 4-6 o'clock region.Previously demonstrated cyst in the 2 o'clock position of the left breast. EXAM: DIGITAL DIAGNOSTIC BILATERAL MAMMOGRAM WITH CAD AND TOMO ULTRASOUND LEFT BREAST COMPARISON:  Previous exam(s). ACR Breast Density Category d: The breast tissue is extremely dense, which lowers the sensitivity of mammography. FINDINGS: Stable mammographic appearance of the breasts with no findings suspicious for malignancy in either breast. Mammographic images were processed with CAD. On physical exam, the patient is tender to palpation  in the 5 o'clock position of the left breast. There is no palpable mass in the areas of tenderness. Targeted ultrasound is performed, showing normal appearing breast tissue throughout the inferior left breast in the areas of tenderness. IMPRESSION: No evidence of malignancy. RECOMMENDATION: Bilateral screening  mammogram in 1 year. I have discussed the findings and recommendations with the patient. Results were also provided in writing at the conclusion of the visit. If applicable, a reminder letter will be sent to the patient regarding the next appointment. BI-RADS CATEGORY  1: Negative. Electronically Signed   By: Claudie Revering M.D.   On: 12/21/2017 10:03       Assessment & Plan:   Problem List Items Addressed This Visit    Gastro-esophageal reflux disease without esophagitis    Controlled.        Health care maintenance    Physical today 12/27/18.  PAP 12/27/18.  Mammogram 12/21/17.  Schedule f/u mammogram.  Colonoscopy 08/03/15.        Hearing loss    Seeing ENT.       Stress    Increased stress as outlined.  Discussed with her today. She overall feels she is handling things relatively well.  Follow.        Relevant Orders   CBC with Differential/Platelet   Comprehensive metabolic panel   TSH   Supraventricular tachycardia (HCC)    Previously evaluated by Dr Rockey Situ.  Stable.         Other Visit Diagnoses    Breast cancer screening    -  Primary   Relevant Orders   MM 3D SCREEN BREAST BILATERAL   Cervical cancer screening       Relevant Orders   Cytology - PAP( [place])   Screening cholesterol level       Relevant Orders   Lipid panel       Einar Pheasant, MD

## 2018-12-27 NOTE — Assessment & Plan Note (Addendum)
Physical today 12/27/18.  PAP 12/27/18.  Mammogram 12/21/17.  Schedule f/u mammogram.  Colonoscopy 08/03/15.

## 2018-12-31 ENCOUNTER — Encounter: Payer: Self-pay | Admitting: Internal Medicine

## 2018-12-31 NOTE — Assessment & Plan Note (Signed)
Seeing ENT

## 2018-12-31 NOTE — Assessment & Plan Note (Signed)
Controlled.  

## 2018-12-31 NOTE — Assessment & Plan Note (Signed)
Increased stress as outlined.  Discussed with her today. She overall feels she is handling things relatively well.  Follow.

## 2018-12-31 NOTE — Assessment & Plan Note (Signed)
Previously evaluated by Dr Rockey Situ.  Stable.

## 2019-01-01 LAB — CYTOLOGY - PAP
Diagnosis: NEGATIVE
HPV: NOT DETECTED

## 2019-01-02 ENCOUNTER — Encounter: Payer: Self-pay | Admitting: Internal Medicine

## 2019-01-07 NOTE — Telephone Encounter (Signed)
Theresa Buck, can you schedule her an appt for mammogram.  See her date.  She requested 01/28/19.  Thanks

## 2019-01-27 ENCOUNTER — Other Ambulatory Visit: Payer: Self-pay

## 2019-01-27 DIAGNOSIS — Z20822 Contact with and (suspected) exposure to covid-19: Secondary | ICD-10-CM

## 2019-01-27 DIAGNOSIS — Z20828 Contact with and (suspected) exposure to other viral communicable diseases: Secondary | ICD-10-CM | POA: Diagnosis not present

## 2019-01-28 ENCOUNTER — Inpatient Hospital Stay: Admission: RE | Admit: 2019-01-28 | Payer: BC Managed Care – PPO | Source: Ambulatory Visit

## 2019-01-28 ENCOUNTER — Other Ambulatory Visit: Payer: BC Managed Care – PPO

## 2019-01-29 LAB — NOVEL CORONAVIRUS, NAA: SARS-CoV-2, NAA: DETECTED — AB

## 2019-02-13 ENCOUNTER — Ambulatory Visit
Admission: RE | Admit: 2019-02-13 | Discharge: 2019-02-13 | Disposition: A | Discharge status: Home / Self Care | Payer: BLUE CROSS/BLUE SHIELD | Source: Ambulatory Visit | Attending: Internal Medicine | Admitting: Internal Medicine

## 2019-02-13 ENCOUNTER — Other Ambulatory Visit: Payer: Self-pay

## 2019-02-13 DIAGNOSIS — Z1239 Encounter for other screening for malignant neoplasm of breast: Secondary | ICD-10-CM

## 2019-02-13 DIAGNOSIS — Z1231 Encounter for screening mammogram for malignant neoplasm of breast: Secondary | ICD-10-CM | POA: Diagnosis not present

## 2019-02-19 ENCOUNTER — Other Ambulatory Visit: Payer: Self-pay

## 2019-02-21 ENCOUNTER — Other Ambulatory Visit: Payer: Self-pay

## 2019-02-21 ENCOUNTER — Other Ambulatory Visit (INDEPENDENT_AMBULATORY_CARE_PROVIDER_SITE_OTHER): Payer: BLUE CROSS/BLUE SHIELD

## 2019-02-21 ENCOUNTER — Ambulatory Visit: Payer: Self-pay

## 2019-02-21 DIAGNOSIS — F439 Reaction to severe stress, unspecified: Secondary | ICD-10-CM | POA: Diagnosis not present

## 2019-02-21 DIAGNOSIS — Z13220 Encounter for screening for lipoid disorders: Secondary | ICD-10-CM

## 2019-02-21 LAB — COMPREHENSIVE METABOLIC PANEL
ALT: 14 U/L (ref 0–35)
AST: 13 U/L (ref 0–37)
Albumin: 4.4 g/dL (ref 3.5–5.2)
Alkaline Phosphatase: 58 U/L (ref 39–117)
BUN: 18 mg/dL (ref 6–23)
CO2: 30 mEq/L (ref 19–32)
Calcium: 9.4 mg/dL (ref 8.4–10.5)
Chloride: 104 mEq/L (ref 96–112)
Creatinine, Ser: 0.74 mg/dL (ref 0.40–1.20)
GFR: 79.51 mL/min (ref >60.00)
Glucose, Bld: 101 mg/dL — ABNORMAL HIGH (ref 70–99)
Potassium: 4.3 mEq/L (ref 3.5–5.1)
Sodium: 140 mEq/L (ref 135–145)
Total Bilirubin: 0.7 mg/dL (ref 0.2–1.2)
Total Protein: 6.5 g/dL (ref 6.0–8.3)

## 2019-02-21 LAB — CBC WITH DIFFERENTIAL/PLATELET
Basophils Absolute: 0 10*3/uL (ref 0.0–0.1)
Basophils Relative: 0.6 % (ref 0.0–3.0)
Eosinophils Absolute: 0.3 10*3/uL (ref 0.0–0.7)
Eosinophils Relative: 5 % (ref 0.0–5.0)
HCT: 41.2 % (ref 36.0–46.0)
Hemoglobin: 13.9 g/dL (ref 12.0–15.0)
Lymphocytes Relative: 34.4 % (ref 12.0–46.0)
Lymphs Abs: 1.7 10*3/uL (ref 0.7–4.0)
MCHC: 33.7 g/dL (ref 30.0–36.0)
MCV: 95 fl (ref 78.0–100.0)
Monocytes Absolute: 0.4 10*3/uL (ref 0.1–1.0)
Monocytes Relative: 8.3 % (ref 3.0–12.0)
Neutro Abs: 2.6 10*3/uL (ref 1.4–7.7)
Neutrophils Relative %: 51.7 % (ref 43.0–77.0)
Platelets: 199 10*3/uL (ref 150.0–400.0)
RBC: 4.34 Mil/uL (ref 3.87–5.11)
RDW: 13 % (ref 11.5–15.5)
WBC: 5 10*3/uL (ref 4.0–10.5)

## 2019-02-21 LAB — LIPID PANEL
Cholesterol: 197 mg/dL (ref 0–200)
HDL: 50.6 mg/dL (ref >39.00)
LDL Cholesterol: 130 mg/dL — ABNORMAL HIGH (ref 0–99)
NonHDL: 146.83
Total CHOL/HDL Ratio: 4
Triglycerides: 85 mg/dL (ref 0.0–149.0)
VLDL: 17 mg/dL (ref 0.0–40.0)

## 2019-02-21 LAB — TSH: TSH: 2.11 u[IU]/mL (ref 0.35–4.50)

## 2019-02-25 ENCOUNTER — Encounter: Payer: Self-pay | Admitting: Internal Medicine

## 2019-03-31 ENCOUNTER — Encounter: Payer: Self-pay | Admitting: Internal Medicine

## 2019-03-31 MED ORDER — HYDROCORTISONE ACETATE 25 MG RE SUPP: 25.0000 mg | Freq: Two times a day (BID) | RECTAL | 0 refills | Status: DC

## 2019-03-31 NOTE — Telephone Encounter (Signed)
rx sent in for anusol hc suppositories.

## 2019-03-31 NOTE — Telephone Encounter (Signed)
Pt having increased issues with hemorrhoids. Has history of hemorrhoids. Nothing else acute noted at this time. Are you ok with sending in the rectal cream for her to use or would you prefer to do a virtual visit with her if agreeable?

## 2019-05-29 DIAGNOSIS — N951 Menopausal and female climacteric states: Secondary | ICD-10-CM | POA: Diagnosis not present

## 2019-07-04 ENCOUNTER — Ambulatory Visit: Payer: BC Managed Care – PPO | Admitting: Internal Medicine

## 2019-08-01 ENCOUNTER — Ambulatory Visit: Payer: BLUE CROSS/BLUE SHIELD | Admitting: Internal Medicine

## 2019-09-05 ENCOUNTER — Ambulatory Visit: Payer: BLUE CROSS/BLUE SHIELD | Admitting: Internal Medicine

## 2019-10-01 DIAGNOSIS — H5213 Myopia, bilateral: Secondary | ICD-10-CM | POA: Diagnosis not present

## 2019-10-17 ENCOUNTER — Other Ambulatory Visit: Payer: Self-pay | Admitting: Internal Medicine

## 2019-10-27 DIAGNOSIS — M41125 Adolescent idiopathic scoliosis, thoracolumbar region: Secondary | ICD-10-CM | POA: Diagnosis not present

## 2019-10-27 DIAGNOSIS — M9903 Segmental and somatic dysfunction of lumbar region: Secondary | ICD-10-CM | POA: Diagnosis not present

## 2019-10-27 DIAGNOSIS — M5417 Radiculopathy, lumbosacral region: Secondary | ICD-10-CM | POA: Diagnosis not present

## 2019-10-27 DIAGNOSIS — M9905 Segmental and somatic dysfunction of pelvic region: Secondary | ICD-10-CM | POA: Diagnosis not present

## 2019-10-29 DIAGNOSIS — M5417 Radiculopathy, lumbosacral region: Secondary | ICD-10-CM | POA: Diagnosis not present

## 2019-10-29 DIAGNOSIS — M9905 Segmental and somatic dysfunction of pelvic region: Secondary | ICD-10-CM | POA: Diagnosis not present

## 2019-10-29 DIAGNOSIS — M9903 Segmental and somatic dysfunction of lumbar region: Secondary | ICD-10-CM | POA: Diagnosis not present

## 2019-10-29 DIAGNOSIS — M41125 Adolescent idiopathic scoliosis, thoracolumbar region: Secondary | ICD-10-CM | POA: Diagnosis not present

## 2019-10-31 ENCOUNTER — Ambulatory Visit: Payer: BLUE CROSS/BLUE SHIELD | Admitting: Internal Medicine

## 2019-10-31 ENCOUNTER — Other Ambulatory Visit: Payer: Self-pay

## 2019-10-31 DIAGNOSIS — I471 Supraventricular tachycardia: Secondary | ICD-10-CM | POA: Diagnosis not present

## 2019-10-31 DIAGNOSIS — K649 Unspecified hemorrhoids: Secondary | ICD-10-CM

## 2019-10-31 DIAGNOSIS — F439 Reaction to severe stress, unspecified: Secondary | ICD-10-CM | POA: Diagnosis not present

## 2019-10-31 DIAGNOSIS — K219 Gastro-esophageal reflux disease without esophagitis: Secondary | ICD-10-CM

## 2019-10-31 MED ORDER — PROPRANOLOL HCL 20 MG PO TABS: 20.0000 mg | ORAL_TABLET | Freq: Three times a day (TID) | ORAL | 0 refills | Status: DC | PRN

## 2019-10-31 MED ORDER — HYDROCORTISONE ACETATE 25 MG RE SUPP: 25.0000 mg | Freq: Two times a day (BID) | RECTAL | 0 refills | Status: DC

## 2019-10-31 NOTE — Progress Notes (Signed)
Patient ID: Theresa Buck, female   DOB: 06-Aug-1955, 64 y.o.   MRN: [id number]   Subjective:    Patient ID: Theresa Buck, female    DOB: 1955/11/04, 64 y.o.   MRN: [id number]  HPI This visit occurred during the SARS-CoV-2 public health emergency.  Safety protocols were in place, including screening questions prior to the visit, additional usage of staff PPE, and extensive cleaning of exam room while observing appropriate contact time as indicated for disinfecting solutions.  Patient here for a scheduled follow up.  She reports she is doing relatively well.  Increased stress at work.  Discussed with her today.  Overall handling things relatively well.  Sons are doing ok.  Tries to stay active.  No chest pain or sob.  No acid reflux reported.  No abdominal pain.  Discussed the need to keep bowels moving.  Still issues with hemorrhoids.  Some burning/irritation intermittently.  No bleeding.  Uses suppositories.  Takes propranolol prn if feels heart racing - which she relates to increased stress.  She has seen cardiology.  Gave her propranolol to take prn.    Past Medical History:  Diagnosis Date  . Allergy    recurring sinus problems  . Anxiety   . Arthritis   . Deviated septum   . Deviated septum   . GERD (gastroesophageal reflux disease)   . H/O sinusitis   . Headache    H/O MIGRAINES  . Hx of degenerative disc disease   . SVT (supraventricular tachycardia) (HCC)    Past Surgical History:  Procedure Laterality Date  . BACK SURGERY  09/26/2013   LUMBAR  . COLONOSCOPY  08/03/2015   Dr Vira Agar  . HEMORRHOID SURGERY N/A 03/13/2017   Procedure: HEMORRHOIDECTOMY;  Surgeon: Christene Lye, MD;  Location: [facility] ORS;  Service: General;  Laterality: N/A;  . UPPER GI ENDOSCOPY  08/03/2015   Dr Vira Agar   Family History  Problem Relation Age of Onset  . Brain cancer Mother   . ALS Father   . Diabetes Brother        multiple  . Heart disease Son        myocardial infarction, s/p  CABG  . Throat cancer Brother   . Breast cancer Neg Hx    Social History   Socioeconomic History  . Marital status: Widowed    Spouse name: Not on file  . Number of children: 3  . Years of education: Not on file  . Highest education level: Not on file  Occupational History  . Not on file  Tobacco Use  . Smoking status: Never Smoker  . Smokeless tobacco: Never Used  Vaping Use  . Vaping Use: Never used  Substance and Sexual Activity  . Alcohol use: No    Alcohol/week: 0.0 standard drinks  . Drug use: No  . Sexual activity: Not on file  Other Topics Concern  . Not on file  Social History Narrative  . Not on file   Social Determinants of Health   Financial Resource Strain:   . Difficulty of Paying Living Expenses:   Food Insecurity:   . Worried About Charity fundraiser in the Last Year:   . Arboriculturist in the Last Year:   Transportation Needs:   . Film/video editor (Medical):   Marland Kitchen Lack of Transportation (Non-Medical):   Physical Activity:   . Days of Exercise per Week:   . Minutes of Exercise per Session:   Stress:   .  Feeling of Stress :   Social Connections:   . Frequency of Communication with Friends and Family:   . Frequency of Social Gatherings with Friends and Family:   . Attends Religious Services:   . Active Member of Clubs or Organizations:   . Attends Archivist Meetings:   Marland Kitchen Marital Status:     Outpatient Encounter Medications as of 10/31/2019  Medication Sig  . Artificial Tear Solution (GENTEAL TEARS) 0.1-0.2-0.3 % SOLN Place 1 drop 3 times/day as needed-between meals & bedtime into both eyes (dry eyes).  . Biotin 5000 MCG TABS Take 1 tablet daily by mouth.   . Calcium Carb-Cholecalciferol (CALCIUM + D3 PO) Take 1 tablet daily by mouth.  . estradiol (ESTRACE) 0.1 MG/GM vaginal cream Place 1 Applicatorful at bedtime vaginally.  Marland Kitchen estradiol (ESTRACE) 1 MG tablet Take 0.5 mg daily by mouth.   . fluticasone (FLONASE) 50 MCG/ACT nasal  spray SHAKE LIQUID AND USE 2 SPRAYS IN EACH NOSTRIL EVERY DAY  . hydrocortisone (ANUSOL-HC) 25 MG suppository Place 1 suppository (25 mg total) rectally 2 (two) times daily.  . montelukast (SINGULAIR) 10 MG tablet Take 10 mg at bedtime by mouth.   . Multiple Vitamin (MULTIVITAMIN) tablet Take 1 tablet by mouth daily.  Marland Kitchen OVER THE COUNTER MEDICATION Take 1 capsule 2 (two) times daily by mouth. XEO Omega  . OVER THE COUNTER MEDICATION Take 1 capsule 2 (two) times daily by mouth. Micro Plex VM  . OVER THE COUNTER MEDICATION Take 1 capsule 2 (two) times daily by mouth. Alpha CRS  . pantoprazole (PROTONIX) 40 MG tablet TAKE 1 TABLET BY MOUTH EVERY DAY  . Probiotic Product (PROBIOTIC ADVANCED PO) Take 1 capsule daily by mouth.   . progesterone (PROMETRIUM) 100 MG capsule Take 100 mg daily by mouth.  . propranolol (INDERAL) 20 MG tablet Take 1 tablet (20 mg total) by mouth 3 (three) times daily as needed.  . traZODone (DESYREL) 50 MG tablet take 1/2 to 1 tablets by mouth at bedtime if needed for sleep  . [DISCONTINUED] hydrocortisone (ANUSOL-HC) 25 MG suppository Place 1 suppository (25 mg total) rectally 2 (two) times daily.  . [DISCONTINUED] propranolol (INDERAL) 20 MG tablet Take 1 tablet (20 mg total) by mouth 3 (three) times daily as needed.   No facility-administered encounter medications on file as of 10/31/2019.    Review of Systems  Constitutional: Negative for appetite change and unexpected weight change.  HENT: Negative for congestion and sinus pressure.   Respiratory: Negative for cough, chest tightness and shortness of breath.   Cardiovascular: Negative for chest pain, palpitations and leg swelling.  Gastrointestinal: Negative for abdominal pain, diarrhea, nausea and vomiting.       Irritation from hemorrhoids as outlined.    Genitourinary: Negative for difficulty urinating and dysuria.  Musculoskeletal: Negative for joint swelling and myalgias.  Neurological: Negative for dizziness,  light-headedness and headaches.  Psychiatric/Behavioral: Negative for agitation and dysphoric mood.       Increased stress as outlined.         Objective:    Physical Exam Vitals reviewed.  Constitutional:      General: She is not in acute distress.    Appearance: Normal appearance.  HENT:     Head: Normocephalic and atraumatic.     Right Ear: External ear normal.     Left Ear: External ear normal.  Eyes:     General: No scleral icterus.       Right eye: No discharge.  Left eye: No discharge.     Conjunctiva/sclera: Conjunctivae normal.  Neck:     Thyroid: No thyromegaly.  Cardiovascular:     Rate and Rhythm: Normal rate and regular rhythm.  Pulmonary:     Effort: No respiratory distress.     Breath sounds: Normal breath sounds. No wheezing.  Abdominal:     General: Bowel sounds are normal.     Palpations: Abdomen is soft.     Tenderness: There is no abdominal tenderness.  Genitourinary:    Comments: Declined rectal exam.  Musculoskeletal:        General: No swelling or tenderness.     Cervical back: Neck supple. No tenderness.  Lymphadenopathy:     Cervical: No cervical adenopathy.  Skin:    Findings: No erythema or rash.  Neurological:     Mental Status: She is alert.  Psychiatric:        Mood and Affect: Mood normal.        Behavior: Behavior normal.     BP 108/68   Pulse 85   Temp 97.9 F (36.6 C)   Resp 16   Ht 5\' 6"  (1.676 m)   Wt 139 lb (63 kg)   LMP 07/15/2012   SpO2 98%   BMI 22.44 kg/m  Wt Readings from Last 3 Encounters:  10/31/19 139 lb (63 kg)  12/27/18 137 lb 9.6 oz (62.4 kg)  06/28/18 136 lb (61.7 kg)     Lab Results  Component Value Date   WBC 5.0 02/21/2019   HGB 13.9 02/21/2019   HCT 41.2 02/21/2019   PLT 199.0 02/21/2019   GLUCOSE 101 (H) 02/21/2019   CHOL 197 02/21/2019   TRIG 85.0 02/21/2019   HDL 50.60 02/21/2019   LDLCALC 130 (H) 02/21/2019   ALT 14 02/21/2019   AST 13 02/21/2019   NA 140 02/21/2019   K 4.3  02/21/2019   CL 104 02/21/2019   CREATININE 0.74 02/21/2019   BUN 18 02/21/2019   CO2 30 02/21/2019   TSH 2.11 02/21/2019    MM 3D SCREEN BREAST BILATERAL  Result Date: 02/13/2019 CLINICAL DATA:  Screening. EXAM: DIGITAL SCREENING BILATERAL MAMMOGRAM WITH TOMO AND CAD COMPARISON:  Previous exam(s). ACR Breast Density Category c: The breast tissue is heterogeneously dense, which may obscure small masses. FINDINGS: There are no findings suspicious for malignancy. Images were processed with CAD. IMPRESSION: No mammographic evidence of malignancy. A result letter of this screening mammogram will be mailed directly to the patient. RECOMMENDATION: Screening mammogram in one year. (Code:SM-B-01Y) BI-RADS CATEGORY  1: Negative. Electronically Signed   By: Margarette Canada M.D.   On: 02/13/2019 12:22       Assessment & Plan:   Problem List Items Addressed This Visit    Gastro-esophageal reflux disease without esophagitis    Controlled.  On protonix.       Hemorrhoids    S/p hemorrhoid procedure by Dr Jamal Collin.  Now with persistent intermittent irritation as outlined. Declined exam today.  Refilled suppositories.  Discussed referral back to surgery for evaluation. Declines currently.  Will let me know if changes her mind.        Relevant Medications   propranolol (INDERAL) 20 MG tablet   Stress    Increased stress as outlined.  Overall appears to be handling things relatively well.  Follow.  Will notify me if feels needs any further intervention.  Follow.        Supraventricular tachycardia ([place])    Previously evaluated by  Dr Rockey Situ.  Overall feels is stable.  Takes propranolol prn.  Follow.        Relevant Medications   propranolol (INDERAL) 20 MG tablet       Einar Pheasant, MD

## 2019-11-01 ENCOUNTER — Encounter: Payer: Self-pay | Admitting: Internal Medicine

## 2019-11-01 NOTE — Assessment & Plan Note (Signed)
S/p hemorrhoid procedure by Dr Jamal Collin.  Now with persistent intermittent irritation as outlined. Declined exam today.  Refilled suppositories.  Discussed referral back to surgery for evaluation. Declines currently.  Will let me know if changes her mind.

## 2019-11-01 NOTE — Assessment & Plan Note (Signed)
Increased stress as outlined.  Overall appears to be handling things relatively well.  Follow.  Will notify me if feels needs any further intervention.  Follow.

## 2019-11-01 NOTE — Assessment & Plan Note (Signed)
Controlled.  On protonix.   

## 2019-11-01 NOTE — Assessment & Plan Note (Signed)
Previously evaluated by Dr Rockey Situ.  Overall feels is stable.  Takes propranolol prn.  Follow.

## 2019-11-04 DIAGNOSIS — M9903 Segmental and somatic dysfunction of lumbar region: Secondary | ICD-10-CM | POA: Diagnosis not present

## 2019-11-04 DIAGNOSIS — M5417 Radiculopathy, lumbosacral region: Secondary | ICD-10-CM | POA: Diagnosis not present

## 2019-11-04 DIAGNOSIS — M9905 Segmental and somatic dysfunction of pelvic region: Secondary | ICD-10-CM | POA: Diagnosis not present

## 2019-11-04 DIAGNOSIS — M41125 Adolescent idiopathic scoliosis, thoracolumbar region: Secondary | ICD-10-CM | POA: Diagnosis not present

## 2019-11-07 DIAGNOSIS — M9905 Segmental and somatic dysfunction of pelvic region: Secondary | ICD-10-CM | POA: Diagnosis not present

## 2019-11-07 DIAGNOSIS — M5417 Radiculopathy, lumbosacral region: Secondary | ICD-10-CM | POA: Diagnosis not present

## 2019-11-07 DIAGNOSIS — M41125 Adolescent idiopathic scoliosis, thoracolumbar region: Secondary | ICD-10-CM | POA: Diagnosis not present

## 2019-11-07 DIAGNOSIS — M9903 Segmental and somatic dysfunction of lumbar region: Secondary | ICD-10-CM | POA: Diagnosis not present

## 2019-11-10 DIAGNOSIS — M41125 Adolescent idiopathic scoliosis, thoracolumbar region: Secondary | ICD-10-CM | POA: Diagnosis not present

## 2019-11-10 DIAGNOSIS — M5417 Radiculopathy, lumbosacral region: Secondary | ICD-10-CM | POA: Diagnosis not present

## 2019-11-10 DIAGNOSIS — M9903 Segmental and somatic dysfunction of lumbar region: Secondary | ICD-10-CM | POA: Diagnosis not present

## 2019-11-10 DIAGNOSIS — M9905 Segmental and somatic dysfunction of pelvic region: Secondary | ICD-10-CM | POA: Diagnosis not present

## 2019-11-12 DIAGNOSIS — M41125 Adolescent idiopathic scoliosis, thoracolumbar region: Secondary | ICD-10-CM | POA: Diagnosis not present

## 2019-11-12 DIAGNOSIS — M5417 Radiculopathy, lumbosacral region: Secondary | ICD-10-CM | POA: Diagnosis not present

## 2019-11-12 DIAGNOSIS — M9903 Segmental and somatic dysfunction of lumbar region: Secondary | ICD-10-CM | POA: Diagnosis not present

## 2019-11-12 DIAGNOSIS — M9905 Segmental and somatic dysfunction of pelvic region: Secondary | ICD-10-CM | POA: Diagnosis not present

## 2019-11-14 DIAGNOSIS — M41125 Adolescent idiopathic scoliosis, thoracolumbar region: Secondary | ICD-10-CM | POA: Diagnosis not present

## 2019-11-14 DIAGNOSIS — M5417 Radiculopathy, lumbosacral region: Secondary | ICD-10-CM | POA: Diagnosis not present

## 2019-11-14 DIAGNOSIS — M9903 Segmental and somatic dysfunction of lumbar region: Secondary | ICD-10-CM | POA: Diagnosis not present

## 2019-11-14 DIAGNOSIS — M9905 Segmental and somatic dysfunction of pelvic region: Secondary | ICD-10-CM | POA: Diagnosis not present

## 2019-11-17 DIAGNOSIS — M41125 Adolescent idiopathic scoliosis, thoracolumbar region: Secondary | ICD-10-CM | POA: Diagnosis not present

## 2019-11-17 DIAGNOSIS — M5417 Radiculopathy, lumbosacral region: Secondary | ICD-10-CM | POA: Diagnosis not present

## 2019-11-17 DIAGNOSIS — M9903 Segmental and somatic dysfunction of lumbar region: Secondary | ICD-10-CM | POA: Diagnosis not present

## 2019-11-17 DIAGNOSIS — M9905 Segmental and somatic dysfunction of pelvic region: Secondary | ICD-10-CM | POA: Diagnosis not present

## 2019-11-19 DIAGNOSIS — M5417 Radiculopathy, lumbosacral region: Secondary | ICD-10-CM | POA: Diagnosis not present

## 2019-11-19 DIAGNOSIS — M41125 Adolescent idiopathic scoliosis, thoracolumbar region: Secondary | ICD-10-CM | POA: Diagnosis not present

## 2019-11-19 DIAGNOSIS — M9903 Segmental and somatic dysfunction of lumbar region: Secondary | ICD-10-CM | POA: Diagnosis not present

## 2019-11-19 DIAGNOSIS — M9905 Segmental and somatic dysfunction of pelvic region: Secondary | ICD-10-CM | POA: Diagnosis not present

## 2019-11-21 DIAGNOSIS — M5417 Radiculopathy, lumbosacral region: Secondary | ICD-10-CM | POA: Diagnosis not present

## 2019-11-21 DIAGNOSIS — M9905 Segmental and somatic dysfunction of pelvic region: Secondary | ICD-10-CM | POA: Diagnosis not present

## 2019-11-21 DIAGNOSIS — M41125 Adolescent idiopathic scoliosis, thoracolumbar region: Secondary | ICD-10-CM | POA: Diagnosis not present

## 2019-11-21 DIAGNOSIS — M9903 Segmental and somatic dysfunction of lumbar region: Secondary | ICD-10-CM | POA: Diagnosis not present

## 2019-11-24 DIAGNOSIS — M5417 Radiculopathy, lumbosacral region: Secondary | ICD-10-CM | POA: Diagnosis not present

## 2019-11-24 DIAGNOSIS — M9903 Segmental and somatic dysfunction of lumbar region: Secondary | ICD-10-CM | POA: Diagnosis not present

## 2019-11-24 DIAGNOSIS — M41125 Adolescent idiopathic scoliosis, thoracolumbar region: Secondary | ICD-10-CM | POA: Diagnosis not present

## 2019-11-24 DIAGNOSIS — M9905 Segmental and somatic dysfunction of pelvic region: Secondary | ICD-10-CM | POA: Diagnosis not present

## 2019-11-27 ENCOUNTER — Other Ambulatory Visit: Payer: Self-pay | Admitting: Internal Medicine

## 2019-11-27 DIAGNOSIS — Z1231 Encounter for screening mammogram for malignant neoplasm of breast: Secondary | ICD-10-CM

## 2019-11-28 ENCOUNTER — Telehealth: Payer: Self-pay | Admitting: Internal Medicine

## 2019-11-28 DIAGNOSIS — M9905 Segmental and somatic dysfunction of pelvic region: Secondary | ICD-10-CM | POA: Diagnosis not present

## 2019-11-28 DIAGNOSIS — M41125 Adolescent idiopathic scoliosis, thoracolumbar region: Secondary | ICD-10-CM | POA: Diagnosis not present

## 2019-11-28 DIAGNOSIS — M9903 Segmental and somatic dysfunction of lumbar region: Secondary | ICD-10-CM | POA: Diagnosis not present

## 2019-11-28 DIAGNOSIS — M5417 Radiculopathy, lumbosacral region: Secondary | ICD-10-CM | POA: Diagnosis not present

## 2019-11-28 NOTE — Telephone Encounter (Signed)
Pt wants to know if she needs to be seen or not for when she eats she has pains in her stomach. It is not all the time. She said she also have sores in her mouth. She wants to know what Dr. Nicki Reaper thinks. She said she thinks she needs a referral to a GI doctor. She didn't want to schedule until after she knows what Dr. Nicki Reaper suggests.

## 2019-11-28 NOTE — Telephone Encounter (Signed)
Patient says started last week around Friday, really not associated  With food just actually a pain in her stomach all the time. Does not keep her up at night. Denies nausea, patient reports BM as normal , no excessive flatus, patient has couple of like ulcers in her mouth  X 2 days. Denies any other symptoms. Offered appt for next Tuesday at 11 patient cannot come unless early morning or late afternoon so she refused works in [place]. Wants referral to GI.

## 2019-11-28 NOTE — Telephone Encounter (Signed)
Can you please triage her?

## 2019-11-29 NOTE — Telephone Encounter (Signed)
I do not mind seeing her and then can determine if needs referral to GI or what other tests needed.  Let her know that I will be out of office for part of week of 12/01/19. I can work her in on 12/09/19.  Can move the current 4:00 pt to 12:00 on 12/09/19 and schedule Ms Tencza for 4:00 on 12/09/19. If questions, just let me know.  If any acute issues will need to be seen earlier.

## 2019-12-01 NOTE — Telephone Encounter (Signed)
Left message to return call to office.

## 2019-12-02 DIAGNOSIS — M9903 Segmental and somatic dysfunction of lumbar region: Secondary | ICD-10-CM | POA: Diagnosis not present

## 2019-12-02 DIAGNOSIS — M5417 Radiculopathy, lumbosacral region: Secondary | ICD-10-CM | POA: Diagnosis not present

## 2019-12-02 DIAGNOSIS — M41125 Adolescent idiopathic scoliosis, thoracolumbar region: Secondary | ICD-10-CM | POA: Diagnosis not present

## 2019-12-02 DIAGNOSIS — M9905 Segmental and somatic dysfunction of pelvic region: Secondary | ICD-10-CM | POA: Diagnosis not present

## 2019-12-05 DIAGNOSIS — M9903 Segmental and somatic dysfunction of lumbar region: Secondary | ICD-10-CM | POA: Diagnosis not present

## 2019-12-05 DIAGNOSIS — M41125 Adolescent idiopathic scoliosis, thoracolumbar region: Secondary | ICD-10-CM | POA: Diagnosis not present

## 2019-12-05 DIAGNOSIS — M5417 Radiculopathy, lumbosacral region: Secondary | ICD-10-CM | POA: Diagnosis not present

## 2019-12-05 DIAGNOSIS — M9905 Segmental and somatic dysfunction of pelvic region: Secondary | ICD-10-CM | POA: Diagnosis not present

## 2019-12-09 DIAGNOSIS — M41125 Adolescent idiopathic scoliosis, thoracolumbar region: Secondary | ICD-10-CM | POA: Diagnosis not present

## 2019-12-09 DIAGNOSIS — M9905 Segmental and somatic dysfunction of pelvic region: Secondary | ICD-10-CM | POA: Diagnosis not present

## 2019-12-09 DIAGNOSIS — M5417 Radiculopathy, lumbosacral region: Secondary | ICD-10-CM | POA: Diagnosis not present

## 2019-12-09 DIAGNOSIS — M9903 Segmental and somatic dysfunction of lumbar region: Secondary | ICD-10-CM | POA: Diagnosis not present

## 2019-12-09 NOTE — Telephone Encounter (Signed)
Left message to call office

## 2019-12-12 DIAGNOSIS — M5417 Radiculopathy, lumbosacral region: Secondary | ICD-10-CM | POA: Diagnosis not present

## 2019-12-12 DIAGNOSIS — M9905 Segmental and somatic dysfunction of pelvic region: Secondary | ICD-10-CM | POA: Diagnosis not present

## 2019-12-12 DIAGNOSIS — M41125 Adolescent idiopathic scoliosis, thoracolumbar region: Secondary | ICD-10-CM | POA: Diagnosis not present

## 2019-12-12 DIAGNOSIS — M9903 Segmental and somatic dysfunction of lumbar region: Secondary | ICD-10-CM | POA: Diagnosis not present

## 2019-12-19 DIAGNOSIS — M9903 Segmental and somatic dysfunction of lumbar region: Secondary | ICD-10-CM | POA: Diagnosis not present

## 2019-12-19 DIAGNOSIS — M9905 Segmental and somatic dysfunction of pelvic region: Secondary | ICD-10-CM | POA: Diagnosis not present

## 2019-12-19 DIAGNOSIS — M41125 Adolescent idiopathic scoliosis, thoracolumbar region: Secondary | ICD-10-CM | POA: Diagnosis not present

## 2019-12-19 DIAGNOSIS — M5417 Radiculopathy, lumbosacral region: Secondary | ICD-10-CM | POA: Diagnosis not present

## 2019-12-24 DIAGNOSIS — M9905 Segmental and somatic dysfunction of pelvic region: Secondary | ICD-10-CM | POA: Diagnosis not present

## 2019-12-24 DIAGNOSIS — M5417 Radiculopathy, lumbosacral region: Secondary | ICD-10-CM | POA: Diagnosis not present

## 2019-12-24 DIAGNOSIS — M9903 Segmental and somatic dysfunction of lumbar region: Secondary | ICD-10-CM | POA: Diagnosis not present

## 2019-12-24 DIAGNOSIS — M41125 Adolescent idiopathic scoliosis, thoracolumbar region: Secondary | ICD-10-CM | POA: Diagnosis not present

## 2019-12-26 ENCOUNTER — Encounter: Payer: Self-pay | Admitting: Internal Medicine

## 2019-12-29 DIAGNOSIS — L03032 Cellulitis of left toe: Secondary | ICD-10-CM | POA: Diagnosis not present

## 2019-12-30 NOTE — Telephone Encounter (Signed)
I am ok to order the test.  Is she wanting to know if antibodies after vaccine?

## 2020-01-01 DIAGNOSIS — L03032 Cellulitis of left toe: Secondary | ICD-10-CM | POA: Diagnosis not present

## 2020-01-20 DIAGNOSIS — M5417 Radiculopathy, lumbosacral region: Secondary | ICD-10-CM | POA: Diagnosis not present

## 2020-01-20 DIAGNOSIS — M9903 Segmental and somatic dysfunction of lumbar region: Secondary | ICD-10-CM | POA: Diagnosis not present

## 2020-01-20 DIAGNOSIS — M41125 Adolescent idiopathic scoliosis, thoracolumbar region: Secondary | ICD-10-CM | POA: Diagnosis not present

## 2020-01-20 DIAGNOSIS — M9905 Segmental and somatic dysfunction of pelvic region: Secondary | ICD-10-CM | POA: Diagnosis not present

## 2020-01-29 ENCOUNTER — Other Ambulatory Visit: Payer: Self-pay | Admitting: Internal Medicine

## 2020-02-13 DIAGNOSIS — M9903 Segmental and somatic dysfunction of lumbar region: Secondary | ICD-10-CM | POA: Diagnosis not present

## 2020-02-13 DIAGNOSIS — M9905 Segmental and somatic dysfunction of pelvic region: Secondary | ICD-10-CM | POA: Diagnosis not present

## 2020-02-13 DIAGNOSIS — M5417 Radiculopathy, lumbosacral region: Secondary | ICD-10-CM | POA: Diagnosis not present

## 2020-02-13 DIAGNOSIS — M41125 Adolescent idiopathic scoliosis, thoracolumbar region: Secondary | ICD-10-CM | POA: Diagnosis not present

## 2020-02-20 ENCOUNTER — Other Ambulatory Visit: Payer: Self-pay

## 2020-02-20 ENCOUNTER — Ambulatory Visit
Admission: RE | Admit: 2020-02-20 | Discharge: 2020-02-20 | Disposition: A | Discharge status: Home / Self Care | Payer: BLUE CROSS/BLUE SHIELD | Source: Ambulatory Visit | Attending: Internal Medicine | Admitting: Internal Medicine

## 2020-02-20 DIAGNOSIS — Z1231 Encounter for screening mammogram for malignant neoplasm of breast: Secondary | ICD-10-CM | POA: Diagnosis not present

## 2020-03-05 ENCOUNTER — Encounter: Payer: Self-pay | Admitting: Internal Medicine

## 2020-03-05 ENCOUNTER — Other Ambulatory Visit: Payer: Self-pay

## 2020-03-05 ENCOUNTER — Ambulatory Visit (INDEPENDENT_AMBULATORY_CARE_PROVIDER_SITE_OTHER): Payer: BLUE CROSS/BLUE SHIELD | Admitting: Internal Medicine

## 2020-03-05 VITALS — BP 118/70 | HR 80 | Temp 98.0°F | Resp 16 | Ht 66.0 in | Wt 140.0 lb

## 2020-03-05 DIAGNOSIS — I471 Supraventricular tachycardia: Secondary | ICD-10-CM

## 2020-03-05 DIAGNOSIS — Z0000 Encounter for general adult medical examination without abnormal findings: Secondary | ICD-10-CM | POA: Diagnosis not present

## 2020-03-05 DIAGNOSIS — F439 Reaction to severe stress, unspecified: Secondary | ICD-10-CM | POA: Diagnosis not present

## 2020-03-05 DIAGNOSIS — K219 Gastro-esophageal reflux disease without esophagitis: Secondary | ICD-10-CM | POA: Diagnosis not present

## 2020-03-05 DIAGNOSIS — K649 Unspecified hemorrhoids: Secondary | ICD-10-CM

## 2020-03-05 DIAGNOSIS — Z13220 Encounter for screening for lipoid disorders: Secondary | ICD-10-CM

## 2020-03-05 NOTE — Progress Notes (Signed)
Patient ID: RUBIE FICCO, female   DOB: [birth year], 64 y.o.   MRN: [id number]   Subjective:    Patient ID: AIVAH PUTMAN, female    DOB: 19-Oct-1955, 64 y.o.   MRN: [id number]  HPI This visit occurred during the SARS-CoV-2 public health emergency.  Safety protocols were in place, including screening questions prior to the visit, additional usage of staff PPE, and extensive cleaning of exam room while observing appropriate contact time as indicated for disinfecting solutions.  Patient here for her physical exam.  She is doing relatively well.  Handling stress.  Tries to stay active.  No chest pain or sob reported.  No abdominal pain or bowel change reported.  Has persistent problems with rectal irritation and hemorrhoids.  Has been using suppositories - intermittently.  No bleeding.  Discussed reevaluated given persistent irritation and hemorrhoidal problems.     Past Medical History:  Diagnosis Date  . Allergy    recurring sinus problems  . Anxiety   . Arthritis   . Deviated septum   . Deviated septum   . GERD (gastroesophageal reflux disease)   . H/O sinusitis   . Headache    H/O MIGRAINES  . Hx of degenerative disc disease   . SVT (supraventricular tachycardia) (HCC)    Past Surgical History:  Procedure Laterality Date  . BACK SURGERY  09/26/2013   LUMBAR  . COLONOSCOPY  08/03/2015   Dr Vira Agar  . HEMORRHOID SURGERY N/A 03/13/2017   Procedure: HEMORRHOIDECTOMY;  Surgeon: Christene Lye, MD;  Location: [facility] ORS;  Service: General;  Laterality: N/A;  . UPPER GI ENDOSCOPY  08/03/2015   Dr Vira Agar   Family History  Problem Relation Age of Onset  . Brain cancer Mother   . ALS Father   . Diabetes Brother        multiple  . Heart disease Son        myocardial infarction, s/p CABG  . Throat cancer Brother   . Breast cancer Neg Hx    Social History   Socioeconomic History  . Marital status: Widowed    Spouse name: Not on file  . Number of children: 3  . Years  of education: Not on file  . Highest education level: Not on file  Occupational History  . Not on file  Tobacco Use  . Smoking status: Never Smoker  . Smokeless tobacco: Never Used  Vaping Use  . Vaping Use: Never used  Substance and Sexual Activity  . Alcohol use: No    Alcohol/week: 0.0 standard drinks  . Drug use: No  . Sexual activity: Not on file  Other Topics Concern  . Not on file  Social History Narrative  . Not on file   Social Determinants of Health   Financial Resource Strain:   . Difficulty of Paying Living Expenses: Not on file  Food Insecurity:   . Worried About Charity fundraiser in the Last Year: Not on file  . Ran Out of Food in the Last Year: Not on file  Transportation Needs:   . Lack of Transportation (Medical): Not on file  . Lack of Transportation (Non-Medical): Not on file  Physical Activity:   . Days of Exercise per Week: Not on file  . Minutes of Exercise per Session: Not on file  Stress:   . Feeling of Stress : Not on file  Social Connections:   . Frequency of Communication with Friends and Family: Not on file  . Frequency  of Social Gatherings with Friends and Family: Not on file  . Attends Religious Services: Not on file  . Active Member of Clubs or Organizations: Not on file  . Attends Archivist Meetings: Not on file  . Marital Status: Not on file    Outpatient Encounter Medications as of 03/05/2020  Medication Sig  . Artificial Tear Solution (GENTEAL TEARS) 0.1-0.2-0.3 % SOLN Place 1 drop 3 times/day as needed-between meals & bedtime into both eyes (dry eyes).  . Biotin 5000 MCG TABS Take 1 tablet daily by mouth.   . Calcium Carb-Cholecalciferol (CALCIUM + D3 PO) Take 1 tablet daily by mouth.  . estradiol (ESTRACE) 0.1 MG/GM vaginal cream Place 1 Applicatorful at bedtime vaginally.  Marland Kitchen estradiol (ESTRACE) 1 MG tablet Take 0.5 mg daily by mouth.   . fluticasone (FLONASE) 50 MCG/ACT nasal spray SHAKE LIQUID AND USE 2 SPRAYS IN  EACH NOSTRIL EVERY DAY  . hydrocortisone (ANUSOL-HC) 25 MG suppository Place 1 suppository (25 mg total) rectally 2 (two) times daily.  . montelukast (SINGULAIR) 10 MG tablet Take 10 mg at bedtime by mouth.   . Multiple Vitamin (MULTIVITAMIN) tablet Take 1 tablet by mouth daily.  . pantoprazole (PROTONIX) 40 MG tablet TAKE 1 TABLET BY MOUTH EVERY DAY  . Probiotic Product (PROBIOTIC ADVANCED PO) Take 1 capsule daily by mouth.   . progesterone (PROMETRIUM) 100 MG capsule Take 100 mg daily by mouth.  . propranolol (INDERAL) 20 MG tablet Take 1 tablet (20 mg total) by mouth 3 (three) times daily as needed.  . traZODone (DESYREL) 50 MG tablet take 1/2 to 1 tablets by mouth at bedtime if needed for sleep  . [DISCONTINUED] OVER THE COUNTER MEDICATION Take 1 capsule 2 (two) times daily by mouth. XEO Omega  . [DISCONTINUED] OVER THE COUNTER MEDICATION Take 1 capsule 2 (two) times daily by mouth. Micro Plex VM  . [DISCONTINUED] OVER THE COUNTER MEDICATION Take 1 capsule 2 (two) times daily by mouth. Alpha CRS   No facility-administered encounter medications on file as of 03/05/2020.    Review of Systems  Constitutional: Negative for appetite change and unexpected weight change.  HENT: Negative for congestion, sinus pressure and sore throat.   Eyes: Negative for pain and visual disturbance.  Respiratory: Negative for cough, chest tightness and shortness of breath.   Cardiovascular: Negative for chest pain, palpitations and leg swelling.  Gastrointestinal: Negative for abdominal pain, diarrhea, nausea and vomiting.  Genitourinary: Negative for difficulty urinating and dysuria.  Musculoskeletal: Negative for joint swelling and myalgias.  Skin: Negative for color change and rash.  Neurological: Negative for dizziness, light-headedness and headaches.  Hematological: Negative for adenopathy. Does not bruise/bleed easily.  Psychiatric/Behavioral: Negative for agitation and dysphoric mood.         Objective:    Physical Exam Vitals reviewed.  Constitutional:      General: She is not in acute distress.    Appearance: Normal appearance. She is well-developed.  HENT:     Head: Normocephalic and atraumatic.     Right Ear: External ear normal.     Left Ear: External ear normal.  Eyes:     General: No scleral icterus.       Right eye: No discharge.        Left eye: No discharge.     Conjunctiva/sclera: Conjunctivae normal.  Neck:     Thyroid: No thyromegaly.  Cardiovascular:     Rate and Rhythm: Normal rate and regular rhythm.  Pulmonary:  Effort: No tachypnea, accessory muscle usage or respiratory distress.     Breath sounds: Normal breath sounds. No decreased breath sounds or wheezing.  Chest:     Breasts:        Right: No inverted nipple, mass, nipple discharge or tenderness (no axillary adenopathy).        Left: No inverted nipple, mass, nipple discharge or tenderness (no axilarry adenopathy).  Abdominal:     General: Bowel sounds are normal.     Palpations: Abdomen is soft.     Tenderness: There is no abdominal tenderness.  Musculoskeletal:        General: No swelling or tenderness.     Cervical back: Neck supple. No tenderness.  Lymphadenopathy:     Cervical: No cervical adenopathy.  Skin:    Findings: No erythema or rash.  Neurological:     Mental Status: She is alert and oriented to person, place, and time.  Psychiatric:        Mood and Affect: Mood normal.        Behavior: Behavior normal.     BP 118/70   Pulse 80   Temp 98 F (36.7 C) (Oral)   Resp 16   Ht 5\' 6"  (1.676 m)   Wt 140 lb (63.5 kg)   LMP 07/15/2012   SpO2 98%   BMI 22.60 kg/m  Wt Readings from Last 3 Encounters:  03/05/20 140 lb (63.5 kg)  10/31/19 139 lb (63 kg)  12/27/18 137 lb 9.6 oz (62.4 kg)     Lab Results  Component Value Date   WBC 5.0 02/21/2019   HGB 13.9 02/21/2019   HCT 41.2 02/21/2019   PLT 199.0 02/21/2019   GLUCOSE 101 (H) 02/21/2019   CHOL 197  02/21/2019   TRIG 85.0 02/21/2019   HDL 50.60 02/21/2019   LDLCALC 130 (H) 02/21/2019   ALT 14 02/21/2019   AST 13 02/21/2019   NA 140 02/21/2019   K 4.3 02/21/2019   CL 104 02/21/2019   CREATININE 0.74 02/21/2019   BUN 18 02/21/2019   CO2 30 02/21/2019   TSH 2.11 02/21/2019    MM 3D SCREEN BREAST BILATERAL  Result Date: 02/20/2020 CLINICAL DATA:  Screening. EXAM: DIGITAL SCREENING BILATERAL MAMMOGRAM WITH TOMO AND CAD COMPARISON:  Previous exam(s). ACR Breast Density Category d: The breast tissue is extremely dense, which lowers the sensitivity of mammography FINDINGS: There are no findings suspicious for malignancy. Images were processed with CAD. IMPRESSION: No mammographic evidence of malignancy. A result letter of this screening mammogram will be mailed directly to the patient. RECOMMENDATION: Screening mammogram in one year. (Code:SM-B-01Y) BI-RADS CATEGORY  1: Negative. Electronically Signed   By: Claudie Revering M.D.   On: 02/20/2020 16:43       Assessment & Plan:   Problem List Items Addressed This Visit    Supraventricular tachycardia ([place])    On propranolol.  Stable.        Stress    Overall appears to be handling stress relatively well.  Follow.        Hemorrhoids    S/p hemorrhoid procedure by Dr Jamal Collin.  Persistent rectal irritation and hemorrhoid issues.  Refer to surgery for evaluation.        Relevant Orders   Ambulatory referral to [place] care maintenance    Physical today 03/05/20.  Mammogram 02/20/20 - Birads I. Colonoscopy 08/03/15.  PAP 12/27/18 - negative with negative HPV.       Gastro-esophageal reflux  disease without esophagitis    No upper symptoms reported. On protonix.        Relevant Orders   CBC with Differential/Platelet   Comprehensive metabolic panel   TSH    Other Visit Diagnoses    Routine general medical examination at a health care facility    -  Primary   Screening cholesterol level       Relevant Orders    Lipid panel       Einar Pheasant, MD

## 2020-03-06 ENCOUNTER — Encounter: Payer: Self-pay | Admitting: Internal Medicine

## 2020-03-06 NOTE — Assessment & Plan Note (Signed)
No upper symptoms reported.  On protonix.   

## 2020-03-06 NOTE — Assessment & Plan Note (Signed)
Overall appears to be handling stress relatively well.  Follow.  

## 2020-03-06 NOTE — Assessment & Plan Note (Signed)
On propranolol.  Stable.

## 2020-03-06 NOTE — Assessment & Plan Note (Signed)
S/p hemorrhoid procedure by Dr Jamal Collin.  Persistent rectal irritation and hemorrhoid issues.  Refer to surgery for evaluation.

## 2020-03-06 NOTE — Assessment & Plan Note (Addendum)
Physical today 03/05/20.  Mammogram 02/20/20 - Birads I. Colonoscopy 08/03/15.  PAP 12/27/18 - negative with negative HPV.

## 2020-03-12 DIAGNOSIS — M5417 Radiculopathy, lumbosacral region: Secondary | ICD-10-CM | POA: Diagnosis not present

## 2020-03-12 DIAGNOSIS — M9905 Segmental and somatic dysfunction of pelvic region: Secondary | ICD-10-CM | POA: Diagnosis not present

## 2020-03-12 DIAGNOSIS — M9903 Segmental and somatic dysfunction of lumbar region: Secondary | ICD-10-CM | POA: Diagnosis not present

## 2020-03-12 DIAGNOSIS — M41125 Adolescent idiopathic scoliosis, thoracolumbar region: Secondary | ICD-10-CM | POA: Diagnosis not present

## 2020-03-25 ENCOUNTER — Other Ambulatory Visit (INDEPENDENT_AMBULATORY_CARE_PROVIDER_SITE_OTHER): Payer: BLUE CROSS/BLUE SHIELD

## 2020-03-25 ENCOUNTER — Other Ambulatory Visit: Payer: Self-pay

## 2020-03-25 DIAGNOSIS — Z13220 Encounter for screening for lipoid disorders: Secondary | ICD-10-CM | POA: Diagnosis not present

## 2020-03-25 DIAGNOSIS — K219 Gastro-esophageal reflux disease without esophagitis: Secondary | ICD-10-CM | POA: Diagnosis not present

## 2020-03-25 LAB — CBC WITH DIFFERENTIAL/PLATELET
Basophils Absolute: 0 10*3/uL (ref 0.0–0.1)
Basophils Relative: 0.7 % (ref 0.0–3.0)
Eosinophils Absolute: 0.2 10*3/uL (ref 0.0–0.7)
Eosinophils Relative: 4 % (ref 0.0–5.0)
HCT: 42.5 % (ref 36.0–46.0)
Hemoglobin: 14.2 g/dL (ref 12.0–15.0)
Lymphocytes Relative: 29 % (ref 12.0–46.0)
Lymphs Abs: 1.6 10*3/uL (ref 0.7–4.0)
MCHC: 33.5 g/dL (ref 30.0–36.0)
MCV: 95.7 fl (ref 78.0–100.0)
Monocytes Absolute: 0.5 10*3/uL (ref 0.1–1.0)
Monocytes Relative: 9.6 % (ref 3.0–12.0)
Neutro Abs: 3.1 10*3/uL (ref 1.4–7.7)
Neutrophils Relative %: 56.7 % (ref 43.0–77.0)
Platelets: 215 10*3/uL (ref 150.0–400.0)
RBC: 4.43 Mil/uL (ref 3.87–5.11)
RDW: 12.6 % (ref 11.5–15.5)
WBC: 5.4 10*3/uL (ref 4.0–10.5)

## 2020-03-25 LAB — LIPID PANEL
Cholesterol: 199 mg/dL (ref 0–200)
HDL: 51.5 mg/dL (ref >39.00)
LDL Cholesterol: 129 mg/dL — ABNORMAL HIGH (ref 0–99)
NonHDL: 147.82
Total CHOL/HDL Ratio: 4
Triglycerides: 95 mg/dL (ref 0.0–149.0)
VLDL: 19 mg/dL (ref 0.0–40.0)

## 2020-03-25 LAB — COMPREHENSIVE METABOLIC PANEL
ALT: 12 U/L (ref 0–35)
AST: 13 U/L (ref 0–37)
Albumin: 4.2 g/dL (ref 3.5–5.2)
Alkaline Phosphatase: 57 U/L (ref 39–117)
BUN: 19 mg/dL (ref 6–23)
CO2: 29 mEq/L (ref 19–32)
Calcium: 9.4 mg/dL (ref 8.4–10.5)
Chloride: 104 mEq/L (ref 96–112)
Creatinine, Ser: 0.81 mg/dL (ref 0.40–1.20)
GFR: 77.4 mL/min (ref >60.00)
Glucose, Bld: 88 mg/dL (ref 70–99)
Potassium: 4.2 mEq/L (ref 3.5–5.1)
Sodium: 139 mEq/L (ref 135–145)
Total Bilirubin: 0.7 mg/dL (ref 0.2–1.2)
Total Protein: 6.8 g/dL (ref 6.0–8.3)

## 2020-03-25 LAB — TSH: TSH: 2 u[IU]/mL (ref 0.35–4.50)

## 2020-04-01 DIAGNOSIS — K648 Other hemorrhoids: Secondary | ICD-10-CM | POA: Diagnosis not present

## 2020-04-21 DIAGNOSIS — M9903 Segmental and somatic dysfunction of lumbar region: Secondary | ICD-10-CM | POA: Diagnosis not present

## 2020-04-21 DIAGNOSIS — M9905 Segmental and somatic dysfunction of pelvic region: Secondary | ICD-10-CM | POA: Diagnosis not present

## 2020-04-21 DIAGNOSIS — M41125 Adolescent idiopathic scoliosis, thoracolumbar region: Secondary | ICD-10-CM | POA: Diagnosis not present

## 2020-04-21 DIAGNOSIS — M5417 Radiculopathy, lumbosacral region: Secondary | ICD-10-CM | POA: Diagnosis not present

## 2020-04-30 ENCOUNTER — Other Ambulatory Visit: Payer: Self-pay

## 2020-04-30 ENCOUNTER — Encounter: Payer: Self-pay | Admitting: Internal Medicine

## 2020-04-30 ENCOUNTER — Telehealth: Payer: Self-pay | Admitting: Internal Medicine

## 2020-04-30 ENCOUNTER — Telehealth (INDEPENDENT_AMBULATORY_CARE_PROVIDER_SITE_OTHER): Payer: BLUE CROSS/BLUE SHIELD | Admitting: Internal Medicine

## 2020-04-30 DIAGNOSIS — F439 Reaction to severe stress, unspecified: Secondary | ICD-10-CM

## 2020-04-30 DIAGNOSIS — U071 COVID-19: Secondary | ICD-10-CM | POA: Diagnosis not present

## 2020-04-30 DIAGNOSIS — I471 Supraventricular tachycardia: Secondary | ICD-10-CM | POA: Diagnosis not present

## 2020-04-30 NOTE — Telephone Encounter (Signed)
Message sent to pt for update.

## 2020-04-30 NOTE — Progress Notes (Signed)
Patient ID: Theresa Buck, female   DOB: [birth year], 65 y.o.   MRN: [id number]   Virtual Visit via video Note  This visit type was conducted due to national recommendations for restrictions regarding the COVID-19 pandemic (e.g. social distancing).  This format is felt to be most appropriate for this patient at this time.  All issues noted in this document were discussed and addressed.  No physical exam was performed (except for noted visual exam findings with Video Visits).   I connected with Pihu Basil by a video enabled telemedicine application and verified that I am speaking with the correct person using two identifiers. Location patient: home Location provider: work  Persons participating in the virtual visit: patient, provider  The limitations, risks, security and privacy concerns of performing an evaluation and management service by video and the availability of in person appointments have been discussed.  it has also been discussed with the patient that there may be a patient responsible charge related to this service. The patient expressed understanding and agreed to proceed.   Reason for visit: work in appt  HPI: [place] trip recently.  Husband tested positive approximately one week ago.  She reports that her symptoms started 1//3/22 - runny nose.  Symptoms progressed over the next couple of days.  Headache last night.  Aching.  Some cough. Slight sore throat - worse in am.  Increased drainage.  No earache.  No chest pain, chest congestion or sob.  Occasional nausea - feels due to drainage.  No vomiting or diarrhea.  Notices cough more when lying down.  Taking tylenol. On singulair.  No fever. Tested positive last night.    ROS: See pertinent positives and negatives per HPI.  Past Medical History:  Diagnosis Date  . Allergy    recurring sinus problems  . Anxiety   . Arthritis   . Deviated septum   . Deviated septum   . GERD (gastroesophageal reflux disease)   . H/O sinusitis    . Headache    H/O MIGRAINES  . Hx of degenerative disc disease   . SVT (supraventricular tachycardia) (HCC)     Past Surgical History:  Procedure Laterality Date  . BACK SURGERY  09/26/2013   LUMBAR  . COLONOSCOPY  08/03/2015   Dr Vira Agar  . HEMORRHOID SURGERY N/A 03/13/2017   Procedure: HEMORRHOIDECTOMY;  Surgeon: Christene Lye, MD;  Location: [facility] ORS;  Service: General;  Laterality: N/A;  . UPPER GI ENDOSCOPY  08/03/2015   Dr Vira Agar    Family History  Problem Relation Age of Onset  . Brain cancer Mother   . ALS Father   . Diabetes Brother        multiple  . Heart disease Son        myocardial infarction, s/p CABG  . Throat cancer Brother   . Breast cancer Neg Hx     SOCIAL HX: reviewed.    Current Outpatient Medications:  .  Artificial Tear Solution (GENTEAL TEARS) 0.1-0.2-0.3 % SOLN, Place 1 drop 3 times/day as needed-between meals & bedtime into both eyes (dry eyes)., Disp: , Rfl:  .  Biotin 5000 MCG TABS, Take 1 tablet daily by mouth. , Disp: , Rfl:  .  Calcium Carb-Cholecalciferol (CALCIUM + D3 PO), Take 1 tablet daily by mouth., Disp: , Rfl:  .  estradiol (ESTRACE) 0.1 MG/GM vaginal cream, Place 1 Applicatorful at bedtime vaginally., Disp: , Rfl:  .  estradiol (ESTRACE) 1 MG tablet, Take 0.5 mg daily by mouth. ,  Disp: , Rfl:  .  fluticasone (FLONASE) 50 MCG/ACT nasal spray, SHAKE LIQUID AND USE 2 SPRAYS IN EACH NOSTRIL EVERY DAY, Disp: 16 g, Rfl: 5 .  hydrocortisone (ANUSOL-HC) 25 MG suppository, Place 1 suppository (25 mg total) rectally 2 (two) times daily., Disp: 20 suppository, Rfl: 0 .  montelukast (SINGULAIR) 10 MG tablet, Take 10 mg at bedtime by mouth. , Disp: , Rfl: 1 .  Multiple Vitamin (MULTIVITAMIN) tablet, Take 1 tablet by mouth daily., Disp: , Rfl:  .  pantoprazole (PROTONIX) 40 MG tablet, TAKE 1 TABLET BY MOUTH EVERY DAY, Disp: 90 tablet, Rfl: 3 .  Probiotic Product (PROBIOTIC ADVANCED PO), Take 1 capsule daily by mouth. , Disp: , Rfl:   .  progesterone (PROMETRIUM) 100 MG capsule, Take 100 mg daily by mouth., Disp: , Rfl: 0 .  propranolol (INDERAL) 20 MG tablet, Take 1 tablet (20 mg total) by mouth 3 (three) times daily as needed., Disp: 90 tablet, Rfl: 0 .  traZODone (DESYREL) 50 MG tablet, take 1/2 to 1 tablets by mouth at bedtime if needed for sleep, Disp: 30 tablet, Rfl: 3  EXAM:  VITALS per patient if applicable: 10.9  GENERAL: alert, oriented, appears well and in no acute distress  HEENT: atraumatic, conjunttiva clear, no obvious abnormalities on inspection of external nose and ears  NECK: normal movements of the head and neck  LUNGS: on inspection no signs of respiratory distress, breathing rate appears normal, no obvious gross SOB, gasping or wheezing  CV: no obvious cyanosis  PSYCH/NEURO: pleasant and cooperative, no obvious depression or anxiety, speech and thought processing grossly intact  ASSESSMENT AND PLAN:  Discussed the following assessment and plan:  Problem List Items Addressed This Visit    Supraventricular tachycardia ([place])    On propranolol.  Stable.       Stress    Overall appears to be handling things well.  Follow.       COVID-19 virus infection    covid positive.  Symptoms as outlined.  Increased drainage.  Some cough. No chest pain, sob or chest tightness.  Continue singulair.  Saline nasal spray, nasacort nasal spray as directed.  Robitussin DM/mucinex as directed.  Follow closely.  Have her send update on symptoms.            I discussed the assessment and treatment plan with the patient. The patient was provided an opportunity to ask questions and all were answered. The patient agreed with the plan and demonstrated an understanding of the instructions.   The patient was advised to call back or seek an in-person evaluation if the symptoms worsen or if the condition fails to improve as anticipated.    Einar Pheasant, MD

## 2020-04-30 NOTE — Telephone Encounter (Signed)
Need to schedule virtual. 12:00 or 4:00 is fine. LM with husband for her to call back

## 2020-04-30 NOTE — Telephone Encounter (Signed)
Pt is scheduled for 12pm today with Dr. Nicki Reaper

## 2020-05-02 ENCOUNTER — Encounter: Payer: Self-pay | Admitting: Internal Medicine

## 2020-05-02 ENCOUNTER — Telehealth: Payer: Self-pay | Admitting: Internal Medicine

## 2020-05-02 DIAGNOSIS — U071 COVID-19: Secondary | ICD-10-CM | POA: Insufficient documentation

## 2020-05-02 NOTE — Assessment & Plan Note (Signed)
On propranolol.  Stable.   

## 2020-05-02 NOTE — Telephone Encounter (Signed)
My chart message sent to pt for update.   

## 2020-05-02 NOTE — Assessment & Plan Note (Signed)
Overall appears to be handling things well.  Follow.  ?

## 2020-05-02 NOTE — Assessment & Plan Note (Signed)
covid positive.  Symptoms as outlined.  Increased drainage.  Some cough. No chest pain, sob or chest tightness.  Continue singulair.  Saline nasal spray, nasacort nasal spray as directed.  Robitussin DM/mucinex as directed.  Follow closely.  Have her send update on symptoms.

## 2020-05-04 ENCOUNTER — Telehealth: Payer: Self-pay | Admitting: Internal Medicine

## 2020-05-04 NOTE — Telephone Encounter (Signed)
Patient called in wanted to know if Dr.Scott would call her something in for her sinus having a lot of drainage

## 2020-05-05 NOTE — Telephone Encounter (Signed)
Called patient to confirm what symptoms she is having and what she is taking. Having some sinus drainage. She is COVID positive. She is using nasal spray, mucinex and robitussin. She says she is feeling better today. She is using the OTC meds that Dr Nicki Reaper recommended and let me know if she needs anything further.

## 2020-05-21 DIAGNOSIS — M41125 Adolescent idiopathic scoliosis, thoracolumbar region: Secondary | ICD-10-CM | POA: Diagnosis not present

## 2020-05-21 DIAGNOSIS — M9905 Segmental and somatic dysfunction of pelvic region: Secondary | ICD-10-CM | POA: Diagnosis not present

## 2020-05-21 DIAGNOSIS — M9903 Segmental and somatic dysfunction of lumbar region: Secondary | ICD-10-CM | POA: Diagnosis not present

## 2020-05-21 DIAGNOSIS — M5417 Radiculopathy, lumbosacral region: Secondary | ICD-10-CM | POA: Diagnosis not present

## 2020-06-15 ENCOUNTER — Other Ambulatory Visit: Payer: Self-pay

## 2020-06-15 ENCOUNTER — Ambulatory Visit: Payer: BLUE CROSS/BLUE SHIELD | Admitting: Dermatology

## 2020-06-15 DIAGNOSIS — L820 Inflamed seborrheic keratosis: Secondary | ICD-10-CM | POA: Diagnosis not present

## 2020-06-15 DIAGNOSIS — L821 Other seborrheic keratosis: Secondary | ICD-10-CM | POA: Diagnosis not present

## 2020-06-15 DIAGNOSIS — H0014 Chalazion left upper eyelid: Secondary | ICD-10-CM | POA: Diagnosis not present

## 2020-06-15 NOTE — Patient Instructions (Signed)
Halog Cream - Apply a small amount to pink spot on forehead twice a day for 3 days only. Then switch to VaniCream Hydrocortisone Cream twice daily until improved.

## 2020-06-15 NOTE — Progress Notes (Signed)
   Follow-Up Visit   Subjective  Theresa Buck is a 65 y.o. female who presents for the following: Spots to check.  Patient here to have a pink spot on her forehead checked. Itchy, irritated, and growing, came up 2 weeks ago. She also has a brown spot on her abdomen. The following portions of the chart were reviewed this encounter and updated as appropriate:       Review of Systems:  No other skin or systemic complaints except as noted in HPI or Assessment and Plan.  Objective  Well appearing patient in no apparent distress; mood and affect are within normal limits.  A focused examination was performed including face, abdomen. Relevant physical exam findings are noted in the Assessment and Plan.  Objective  Right Forehead: Pink scaly patch  Objective  Left Upper Eyelid: 3.78mm flesh papule at upper eyelid margin- area gets inflamed and sore at times.   Assessment & Plan    Seborrheic Keratoses - Stuck-on, waxy, tan-brown papules of the left abdomen - Discussed benign etiology and prognosis. - Observe - Call for any changes  Inflamed seborrheic keratosis Right Forehead  vs Dermatitis  Start Halog cream bid x 3 days, then start HC VaniCream bid until clear, sample given. If not improved with topical in 2 weeks, will treat with cryotherapy.   Chalazion of left upper eyelid Left Upper Eyelid  If flares, discussed doxycycline and warm compresses. Patient will call for Rx. Patient will discuss with eye doctor for further treatment.  Return in about 3 weeks (around 07/06/2020) for recheck ISK vs dermatitis.   IJamesetta Orleans, CMA, am acting as scribe for Brendolyn Patty, MD .  Documentation: I have reviewed the above documentation for accuracy and completeness, and I agree with the above.  Brendolyn Patty MD

## 2020-06-18 DIAGNOSIS — M9905 Segmental and somatic dysfunction of pelvic region: Secondary | ICD-10-CM | POA: Diagnosis not present

## 2020-06-18 DIAGNOSIS — M9903 Segmental and somatic dysfunction of lumbar region: Secondary | ICD-10-CM | POA: Diagnosis not present

## 2020-06-18 DIAGNOSIS — M41125 Adolescent idiopathic scoliosis, thoracolumbar region: Secondary | ICD-10-CM | POA: Diagnosis not present

## 2020-06-18 DIAGNOSIS — M5417 Radiculopathy, lumbosacral region: Secondary | ICD-10-CM | POA: Diagnosis not present

## 2020-06-24 ENCOUNTER — Encounter: Payer: Self-pay | Admitting: Internal Medicine

## 2020-06-24 DIAGNOSIS — R002 Palpitations: Secondary | ICD-10-CM

## 2020-06-25 NOTE — Telephone Encounter (Signed)
Patient is aware 

## 2020-06-25 NOTE — Telephone Encounter (Signed)
Order placed for cardiology referral.   

## 2020-06-25 NOTE — Telephone Encounter (Signed)
I have placed the order for the referral.  See if she feels she needs to be seen earlier.  I can work her in if needed.  Any acute symptoms, needs to be evaluated.

## 2020-07-02 ENCOUNTER — Encounter: Payer: Self-pay | Admitting: Dermatology

## 2020-07-06 ENCOUNTER — Ambulatory Visit: Payer: BLUE CROSS/BLUE SHIELD | Admitting: Dermatology

## 2020-07-15 ENCOUNTER — Emergency Department
Admission: EM | Admit: 2020-07-15 | Discharge: 2020-07-15 | Disposition: A | Discharge status: Home / Self Care | Payer: BLUE CROSS/BLUE SHIELD | Attending: Emergency Medicine | Admitting: Emergency Medicine

## 2020-07-15 ENCOUNTER — Telehealth: Payer: Self-pay

## 2020-07-15 ENCOUNTER — Emergency Department (HOSPITAL_BASED_OUTPATIENT_CLINIC_OR_DEPARTMENT_OTHER)
Admit: 2020-07-15 | Discharge: 2020-07-15 | Disposition: A | Discharge status: Home / Self Care | Payer: BLUE CROSS/BLUE SHIELD | Attending: Cardiovascular Disease | Admitting: Cardiovascular Disease

## 2020-07-15 ENCOUNTER — Other Ambulatory Visit: Payer: Self-pay

## 2020-07-15 ENCOUNTER — Emergency Department: Payer: BLUE CROSS/BLUE SHIELD

## 2020-07-15 DIAGNOSIS — I471 Supraventricular tachycardia: Secondary | ICD-10-CM

## 2020-07-15 DIAGNOSIS — R079 Chest pain, unspecified: Secondary | ICD-10-CM | POA: Diagnosis not present

## 2020-07-15 DIAGNOSIS — Z8616 Personal history of COVID-19: Secondary | ICD-10-CM | POA: Insufficient documentation

## 2020-07-15 DIAGNOSIS — R0989 Other specified symptoms and signs involving the circulatory and respiratory systems: Secondary | ICD-10-CM

## 2020-07-15 DIAGNOSIS — I493 Ventricular premature depolarization: Secondary | ICD-10-CM | POA: Diagnosis not present

## 2020-07-15 DIAGNOSIS — I429 Cardiomyopathy, unspecified: Secondary | ICD-10-CM | POA: Diagnosis not present

## 2020-07-15 DIAGNOSIS — I428 Other cardiomyopathies: Secondary | ICD-10-CM

## 2020-07-15 LAB — COMPREHENSIVE METABOLIC PANEL
ALT: 20 U/L (ref 0–44)
AST: 21 U/L (ref 15–41)
Albumin: 4.3 g/dL (ref 3.5–5.0)
Alkaline Phosphatase: 65 U/L (ref 38–126)
Anion gap: 9 (ref 5–15)
BUN: 19 mg/dL (ref 8–23)
CO2: 25 mmol/L (ref 22–32)
Calcium: 9.4 mg/dL (ref 8.9–10.3)
Chloride: 103 mmol/L (ref 98–111)
Creatinine, Ser: 0.77 mg/dL (ref 0.44–1.00)
GFR, Estimated: >60 mL/min (ref >60)
Glucose, Bld: 142 mg/dL — ABNORMAL HIGH (ref 70–99)
Potassium: 4.3 mmol/L (ref 3.5–5.1)
Sodium: 137 mmol/L (ref 135–145)
Total Bilirubin: 1.1 mg/dL (ref 0.3–1.2)
Total Protein: 7.3 g/dL (ref 6.5–8.1)

## 2020-07-15 LAB — CBC WITH DIFFERENTIAL/PLATELET
Abs Immature Granulocytes: 0.02 10*3/uL (ref 0.00–0.07)
Basophils Absolute: 0.1 10*3/uL (ref 0.0–0.1)
Basophils Relative: 1 %
Eosinophils Absolute: 0.3 10*3/uL (ref 0.0–0.5)
Eosinophils Relative: 4 %
HCT: 45.5 % (ref 36.0–46.0)
Hemoglobin: 15.4 g/dL — ABNORMAL HIGH (ref 12.0–15.0)
Immature Granulocytes: 0 %
Lymphocytes Relative: 33 %
Lymphs Abs: 2.1 10*3/uL (ref 0.7–4.0)
MCH: 32.3 pg (ref 26.0–34.0)
MCHC: 33.8 g/dL (ref 30.0–36.0)
MCV: 95.4 fL (ref 80.0–100.0)
Monocytes Absolute: 0.5 10*3/uL (ref 0.1–1.0)
Monocytes Relative: 9 %
Neutro Abs: 3.3 10*3/uL (ref 1.7–7.7)
Neutrophils Relative %: 53 %
Platelets: 214 10*3/uL (ref 150–400)
RBC: 4.77 MIL/uL (ref 3.87–5.11)
RDW: 11.9 % (ref 11.5–15.5)
WBC: 6.3 10*3/uL (ref 4.0–10.5)
nRBC: 0 % (ref 0.0–0.2)

## 2020-07-15 LAB — ECHOCARDIOGRAM COMPLETE
AR max vel: 2.23 cm2
AV Area VTI: 2.11 cm2
AV Area mean vel: 2.11 cm2
AV Mean grad: 2 mmHg
AV Peak grad: 4.1 mmHg
Ao pk vel: 1.01 m/s
Area-P 1/2: 3.27 cm2
Height: 66 in
MV VTI: 2.89 cm2
S' Lateral: 2.9 cm
Weight: 2240 oz

## 2020-07-15 LAB — TSH: TSH: 2.122 u[IU]/mL (ref 0.350–4.500)

## 2020-07-15 LAB — TROPONIN I (HIGH SENSITIVITY)
Troponin I (High Sensitivity): 4 ng/L (ref <18)
Troponin I (High Sensitivity): 26 ng/L — ABNORMAL HIGH (ref <18)

## 2020-07-15 LAB — MAGNESIUM: Magnesium: 2.1 mg/dL (ref 1.7–2.4)

## 2020-07-15 LAB — T4, FREE: Free T4: 1.19 ng/dL — ABNORMAL HIGH (ref 0.61–1.12)

## 2020-07-15 MED ORDER — METOPROLOL TARTRATE 25 MG PO TABS
25.0000 mg | ORAL_TABLET | Freq: Once | ORAL | Status: AC
  Administered 2020-07-15: 25 mg via ORAL
  Filled 2020-07-15: qty 1

## 2020-07-15 MED ORDER — METOPROLOL TARTRATE 25 MG PO TABS: 25.0000 mg | ORAL_TABLET | Freq: Two times a day (BID) | ORAL | 0 refills | Status: DC

## 2020-07-15 NOTE — ED Triage Notes (Signed)
Pt states that she was getting ready for work and was having chest pain and jaw pain since 0705- pt states it felt like her heart was racing- pt has had these episodes before but normally they do not last more than a couple seconds

## 2020-07-15 NOTE — Consult Note (Signed)
Cardiology Consultation:   Patient ID: Theresa Buck MRN: [id number]; DOB: 1955-12-31  Admit date: 07/15/2020 Date of Consult: 07/15/2020  PCP:  Einar Pheasant, [place]  Cardiologist:  Esmond Plants - [place] Provider:  No care team member to display Electrophysiologist:  New to [facility] Reason for consult: Wide-complex tachycardia Physician requesting consult: Dr. Jari Pigg  Patient Profile:   Theresa Buck is a 65 y.o. female with a hx of paroxysmal tachycardia dating back 10 years, presenting with acute onset tachycardia, shortness of breath, chest pain jaw pain, Friend brought her into the emergency room, noted to have wide-complex tachycardia 220 bpm    History of Present Illness:   Theresa Buck reports recent tachycardic episodes typically lasting about 5 minutes.  Symptoms date back many years but has been more frequent recently.  Woke up this morning with acute onset tachycardia, with some associated left jaw pain, neck pain shortness of breath diaphoresis.  Symptoms did not break, she took her propranolol which she had as needed, no relief of her symptoms.  Friend brought her into the emergency room  Initial EKG showing wide complex tachycardia rate 220 bpm Consistent with SVT Rhythm broke on its own without intervention, follow-up EKG with normal sinus rhythm, PVC or s  No further arrhythmia in the emergency room Currently feels well with no complaints Minimal elevation of troponin, other lab work within normal range Chest x-ray nonacute   Past Medical History:  Diagnosis Date  . Allergy    recurring sinus problems  . Anxiety   . Arthritis   . Deviated septum   . Deviated septum   . GERD (gastroesophageal reflux disease)   . H/O sinusitis   . Headache    H/O MIGRAINES  . Hx of degenerative disc disease   . SVT (supraventricular tachycardia) (HCC)     Past Surgical History:  Procedure Laterality Date  . BACK  SURGERY  09/26/2013   LUMBAR  . COLONOSCOPY  08/03/2015   Dr Vira Agar  . HEMORRHOID SURGERY N/A 03/13/2017   Procedure: HEMORRHOIDECTOMY;  Surgeon: Christene Lye, MD;  Location: [facility] ORS;  Service: General;  Laterality: N/A;  . UPPER GI ENDOSCOPY  08/03/2015   Dr Vira Agar     Home Medications:  Prior to Admission medications   Medication Sig Start Date End Date Taking? Authorizing Provider  metoprolol tartrate (LOPRESSOR) 25 MG tablet Take 1 tablet (25 mg total) by mouth 2 (two) times daily. 07/15/20 08/14/20 Yes Vanessa Lumberton, MD  Artificial Tear Solution (GENTEAL TEARS) 0.1-0.2-0.3 % SOLN Place 1 drop 3 times/day as needed-between meals & bedtime into both eyes (dry eyes).    [provider]  Biotin 5000 MCG TABS Take 1 tablet daily by mouth.     [provider]  Calcium Carb-Cholecalciferol (CALCIUM + D3 PO) Take 1 tablet daily by mouth.    [provider]  estradiol (ESTRACE) 0.1 MG/GM vaginal cream Place 1 Applicatorful at bedtime vaginally.    [provider]  estradiol (ESTRACE) 1 MG tablet Take 0.5 mg daily by mouth.     [provider]  fluticasone (FLONASE) 50 MCG/ACT nasal spray SHAKE LIQUID AND USE 2 SPRAYS IN EACH NOSTRIL EVERY DAY 10/17/19   Einar Pheasant, MD  hydrocortisone (ANUSOL-HC) 25 MG suppository Place 1 suppository (25 mg total) rectally 2 (two) times daily. 10/31/19   Einar Pheasant, MD  montelukast (SINGULAIR) 10 MG tablet Take 10 mg at bedtime by mouth.  09/29/16   [provider]  Multiple Vitamin (MULTIVITAMIN) tablet Take 1 tablet by mouth daily.    [provider]  pantoprazole (PROTONIX) 40 MG tablet TAKE 1 TABLET BY MOUTH EVERY DAY 01/29/20   Einar Pheasant, MD  Probiotic Product (PROBIOTIC ADVANCED PO) Take 1 capsule daily by mouth.     [provider]  progesterone (PROMETRIUM) 100 MG capsule Take 100 mg daily by mouth. 01/22/17   [provider]  propranolol (INDERAL) 20 MG  tablet Take 1 tablet (20 mg total) by mouth 3 (three) times daily as needed. 10/31/19   Einar Pheasant, MD  traZODone (DESYREL) 50 MG tablet take 1/2 to 1 tablets by mouth at bedtime if needed for sleep 12/27/17   Einar Pheasant, MD    Inpatient Medications: Scheduled Meds:  Continuous Infusions:  PRN Meds:   Allergies:    Allergies  Allergen Reactions  . Prednisone Other (See Comments)    Gi upset-CAN TAKE BUT NOT FOR LONG PERIODS OF TIME  . Ceftin [Cefuroxime Axetil] Rash    Social History:   Social History   Socioeconomic History  . Marital status: Widowed    Spouse name: Not on file  . Number of children: 3  . Years of education: Not on file  . Highest education level: Not on file  Occupational History  . Not on file  Tobacco Use  . Smoking status: Never Smoker  . Smokeless tobacco: Never Used  Vaping Use  . Vaping Use: Never used  Substance and Sexual Activity  . Alcohol use: No    Alcohol/week: 0.0 standard drinks  . Drug use: No  . Sexual activity: Not on file  Other Topics Concern  . Not on file  Social History Narrative  . Not on file   Social Determinants of Health   Financial Resource Strain: Not on file  Food Insecurity: Not on file  Transportation Needs: Not on file  Physical Activity: Not on file  Stress: Not on file  Social Connections: Not on file  Intimate Partner Violence: Not on file    Family History:    Family History  Problem Relation Age of Onset  . Brain cancer Mother   . ALS Father   . Diabetes Brother        multiple  . Heart disease Son        myocardial infarction, s/p CABG  . Throat cancer Brother   . Breast cancer Neg Hx      ROS:  Please see the history of present illness.  Review of Systems  Constitutional: Negative.   HENT: Negative.   Respiratory: Positive for shortness of breath.   Cardiovascular: Positive for palpitations.       Tachycardia  Gastrointestinal: Negative.   Musculoskeletal: Negative.    Neurological: Negative.   Psychiatric/Behavioral: Negative.   All other systems reviewed and are negative.   Physical Exam/Data:   Vitals:   07/15/20 0830 07/15/20 0900 07/15/20 1006 07/15/20 1030  BP: (!) 123/91 (!) 117/97 111/81 114/81  Pulse: 79 80 75 69  Resp: 14 16 16 18   Temp:      TempSrc:      SpO2: 99% 96% 98% 99%  Weight:      Height:       No intake or output data in the 24 hours ending 07/15/20 1054 Last 3 Weights 07/15/2020 03/05/2020 10/31/2019  Weight (lbs) 140 lb 140 lb 139 lb  Weight (kg) 63.504 kg 63.504 kg 63.05 kg  Body mass index is 22.6 kg/m.  General:  Well nourished, well developed, in no acute distress HEENT: normal Lymph: no adenopathy Neck: no JVD Endocrine:  No thryomegaly Vascular: No carotid bruits; FA pulses 2+ bilaterally without bruits  Cardiac:  normal S1, S2; RRR; no murmur  Lungs:  clear to auscultation bilaterally, no wheezing, rhonchi or rales  Abd: soft, nontender, no hepatomegaly  Ext: no edema Musculoskeletal:  No deformities, BUE and BLE strength normal and equal Skin: warm and dry  Neuro:  CNs 2-12 intact, no focal abnormalities noted Psych:  Normal affect   EKG:  The EKG was personally reviewed and demonstrates:   Wide-complex tachycardia rate 220 bpm Follow-up EKG normal sinus rhythm, PVCs  Telemetry:  Telemetry was personally reviewed and demonstrates:   Normal sinus rhythm  Relevant CV Studies: Echocardiogram Ejection fraction 45 to 50%, mild global hypokinesis Mild to moderate MR, TR  Laboratory Data:  High Sensitivity Troponin:   Recent Labs  Lab 07/15/20 0812 07/15/20 1004  TROPONINIHS 4 26*     Chemistry Recent Labs  Lab 07/15/20 0812  NA 137  K 4.3  CL 103  CO2 25  GLUCOSE 142*  BUN 19  CREATININE 0.77  CALCIUM 9.4  GFRNONAA >60  ANIONGAP 9    Recent Labs  Lab 07/15/20 0812  PROT 7.3  ALBUMIN 4.3  AST 21  ALT 20  ALKPHOS 65  BILITOT 1.1   Hematology Recent Labs  Lab  07/15/20 0812  WBC 6.3  RBC 4.77  HGB 15.4*  HCT 45.5  MCV 95.4  MCH 32.3  MCHC 33.8  RDW 11.9  PLT 214   BNPNo results for input(s): BNP, PROBNP in the last 168 hours.  DDimer No results for input(s): DDIMER in the last 168 hours.   Radiology/Studies:  DG Chest Portable 1 View  Result Date: 07/15/2020 CLINICAL DATA:  Chest pain. EXAM: PORTABLE CHEST 1 VIEW COMPARISON:  None. FINDINGS: The heart size and mediastinal contours are within normal limits. Both lungs are clear. No pneumothorax or pleural effusion is noted. The visualized skeletal structures are unremarkable. IMPRESSION: No active disease. Electronically Signed   By: Marijo Conception M.D.   On: 07/15/2020 08:28   ECHOCARDIOGRAM COMPLETE  Result Date: 07/15/2020    ECHOCARDIOGRAM REPORT   Patient Name:   Theresa Buck Date of Exam: 07/15/2020 Medical Rec #:  [id number]       Height:       66.0 in Accession #:    [id number]      Weight:       140.0 lb Date of Birth:  04/08/1956       BSA:          1.719 m Patient Age:    60 years        BP:           117/97 mmHg Patient Gender: F               HR:           66 bpm. Exam Location:  [facility] Procedure: 2D Echo, Color Doppler, Cardiac Doppler and Strain Analysis Indications:     Supraventricular tachycardia  History:         Patient has no prior history of Echocardiogram examinations.                  Arrythmias:SVT.  Sonographer:     Charmayne Sheer RDCS (AE) Referring Phys:  [id number] Minna Merritts Diagnosing  Phys: Ida Rogue MD  Sonographer Comments: Global longitudinal strain was attempted. IMPRESSIONS  1. Left ventricular ejection fraction, by estimation, is 45 to 50%. The left ventricle has mildly decreased function. The left ventricle demonstrates global hypokinesis. Left ventricular diastolic parameters are consistent with Grade I diastolic dysfunction (impaired relaxation). The average left ventricular global longitudinal strain is -12.5 %. The global longitudinal strain is abnormal.  2.  Right ventricular systolic function is normal. The right ventricular size is normal.  3. The mitral valve is normal in structure. Mild to moderate mitral valve regurgitation.  4. Tricuspid valve regurgitation is mild to moderate. FINDINGS  Left Ventricle: Left ventricular ejection fraction, by estimation, is 45 to 50%. The left ventricle has mildly decreased function. The left ventricle demonstrates global hypokinesis. The average left ventricular global longitudinal strain is -12.5 %. The global longitudinal strain is abnormal. The left ventricular internal cavity size was normal in size. There is no left ventricular hypertrophy. Left ventricular diastolic parameters are consistent with Grade I diastolic dysfunction (impaired relaxation). Right Ventricle: The right ventricular size is normal. No increase in right ventricular wall thickness. Right ventricular systolic function is normal. Left Atrium: Left atrial size was normal in size. Right Atrium: Right atrial size was normal in size. Pericardium: There is no evidence of pericardial effusion. Mitral Valve: The mitral valve is normal in structure. Mild to moderate mitral valve regurgitation. No evidence of mitral valve stenosis. MV peak gradient, 2.2 mmHg. The mean mitral valve gradient is 1.0 mmHg. Tricuspid Valve: The tricuspid valve is normal in structure. Tricuspid valve regurgitation is mild to moderate. No evidence of tricuspid stenosis. Aortic Valve: The aortic valve is normal in structure. Aortic valve regurgitation is not visualized. No aortic stenosis is present. Aortic valve mean gradient measures 2.0 mmHg. Aortic valve peak gradient measures 4.1 mmHg. Aortic valve area, by VTI measures 2.11 cm. Pulmonic Valve: The pulmonic valve was normal in structure. Pulmonic valve regurgitation is not visualized. No evidence of pulmonic stenosis. Aorta: The aortic root is normal in size and structure. Venous: The inferior vena cava is normal in size with greater  than 50% respiratory variability, suggesting right atrial pressure of 3 mmHg. IAS/Shunts: No atrial level shunt detected by color flow Doppler.  LEFT VENTRICLE PLAX 2D LVIDd:         4.30 cm  Diastology LVIDs:         2.90 cm  LV e' medial:    3.70 cm/s LV PW:         1.10 cm  LV E/e' medial:  10.6 LV IVS:        0.80 cm  LV e' lateral:   6.64 cm/s LVOT diam:     2.00 cm  LV E/e' lateral: 5.9 LV SV:         44 LV SV Index:   26       2D Longitudinal Strain LVOT Area:     3.14 cm 2D Strain GLS Avg:     -12.5 %  RIGHT VENTRICLE RV Basal diam:  2.50 cm LEFT ATRIUM             Index       RIGHT ATRIUM           Index LA diam:        3.20 cm 1.86 cm/m  RA Area:     16.00 cm LA Vol (A2C):   22.6 ml 13.15 ml/m RA Volume:   42.70 ml  24.85 ml/m  LA Vol (A4C):   24.5 ml 14.26 ml/m LA Biplane Vol: 23.7 ml 13.79 ml/m  AORTIC VALVE                   PULMONIC VALVE AV Area (Vmax):    2.23 cm    PV Vmax:       0.56 m/s AV Area (Vmean):   2.11 cm    PV Vmean:      38.400 cm/s AV Area (VTI):     2.11 cm    PV VTI:        0.096 m AV Vmax:           101.00 cm/s PV Peak grad:  1.3 mmHg AV Vmean:          75.500 cm/s PV Mean grad:  1.0 mmHg AV VTI:            0.208 m AV Peak Grad:      4.1 mmHg AV Mean Grad:      2.0 mmHg LVOT Vmax:         71.80 cm/s LVOT Vmean:        50.600 cm/s LVOT VTI:          0.140 m LVOT/AV VTI ratio: 0.67  AORTA Ao Root diam: 3.20 cm MITRAL VALVE               TRICUSPID VALVE MV Area (PHT): 3.27 cm    TR Peak grad:   19.9 mmHg MV Area VTI:   2.89 cm    TR Vmax:        223.00 cm/s MV Peak grad:  2.2 mmHg MV Mean grad:  1.0 mmHg    SHUNTS MV Vmax:       0.74 m/s    Systemic VTI:  0.14 m MV Vmean:      41.5 cm/s   Systemic Diam: 2.00 cm MV Decel Time: 232 msec MV E velocity: 39.40 cm/s MV A velocity: 69.00 cm/s MV E/A ratio:  0.57 Ida Rogue MD Electronically signed by Ida Rogue MD Signature Date/Time: 07/15/2020/10:48:41 AM    Final      Assessment and Plan:   1. Wide-complex  tachycardia/SVT Worsening symptoms she reports over the past several months, this is the worst episode she has had to date Case discussed with Dr. Caryl Comes, EP. rhythm concerning for SVT -Given mildly depressed ejection fraction we will avoid calcium channel blockers, will start metoprolol tartrate 25 twice daily with hope to transition to metoprolol succinate at a later date.  Blood pressure low limiting higher doses of metoprolol -Plan is for referral to EP, Dr. Quentin Ore consideration of ablation  2.  Cardiomyopathy/depressed ejection fraction Unable to exclude tachycardia mediated, stress mediated To rule out structural heart disease, coronary disease, cardiac CTA will be ordered as outpatient -Note sent to cardiology office nursing to help arrange  3.  PVCs Appears to be RV outflow tract based We will defer to EP Beta-blocker as above    Total encounter time more than 110 minutes  Greater than 50% was spent in counseling and coordination of care with the patient   For questions or updates, please contact [place] Please consult [email or web address] for contact info under    Signed, Ida Rogue, MD  07/15/2020 10:54 AM

## 2020-07-15 NOTE — ED Provider Notes (Signed)
[facility] Emergency Department Provider Note  ____________________________________________   Event Date/Time   First MD Initiated Contact with Patient 07/15/20 [phone]     (approximate)  I have reviewed the triage vital signs and the nursing notes.   HISTORY  Chief Complaint Tachycardia and Chest Pain    HPI Theresa Buck is a 65 y.o. female with anxiety, supraventricular tachycardia who comes in with chest pain and palpitations.  Patient reports being followed by Dr. Rockey Situ for brief episodes of chest pain and tachycardia.  She states that they never been able to capture anything on the monitor but they are treating her with propranolol for the tachycardia.  She states that she had called the office to try to get an appointment because he has been having these increased episodes that are every few days, intermittent, nothing makes it better, nothing makes him worse.  She reports they last for a few minutes and then go away normally.  However today she had an episode that started at 7 AM and lasted until arrival.  She denies any shortness of breath, leg swelling, history of blood clots.  She states that she was making coffee when it started       Past Medical History:  Diagnosis Date  . Allergy    recurring sinus problems  . Anxiety   . Arthritis   . Deviated septum   . Deviated septum   . GERD (gastroesophageal reflux disease)   . H/O sinusitis   . Headache    H/O MIGRAINES  . Hx of degenerative disc disease   . SVT (supraventricular tachycardia) [facility])     Patient Active Problem List   Diagnosis Date Noted  . COVID-19 virus infection 05/02/2020  . Hearing loss 06/30/2018  . Hemorrhoids 12/10/2016  . Fullness of breast 08/11/2015  . Epigastric pain 02/14/2015  . Pain of left foot 08/16/2014  . Health care maintenance 08/16/2014  . Other specified symptoms and signs involving the digestive system and abdomen 07/16/2014  . Mastodynia  04/06/2013  . Acute vaginitis 02/09/2013  . Supraventricular tachycardia ([place]) 08/27/2012  . Stress 08/27/2012  . Other nonmedicinal substance allergy status 04/07/2012  . Gastro-esophageal reflux disease without esophagitis 04/07/2012    Past Surgical History:  Procedure Laterality Date  . BACK SURGERY  09/26/2013   LUMBAR  . COLONOSCOPY  08/03/2015   Dr Vira Agar  . HEMORRHOID SURGERY N/A 03/13/2017   Procedure: HEMORRHOIDECTOMY;  Surgeon: Christene Lye, MD;  Location: [facility] ORS;  Service: General;  Laterality: N/A;  . UPPER GI ENDOSCOPY  08/03/2015   Dr Vira Agar    Prior to Admission medications   Medication Sig Start Date End Date Taking? Authorizing Provider  Artificial Tear Solution (GENTEAL TEARS) 0.1-0.2-0.3 % SOLN Place 1 drop 3 times/day as needed-between meals & bedtime into both eyes (dry eyes).    [provider]  Biotin 5000 MCG TABS Take 1 tablet daily by mouth.     [provider]  Calcium Carb-Cholecalciferol (CALCIUM + D3 PO) Take 1 tablet daily by mouth.    [provider]  estradiol (ESTRACE) 0.1 MG/GM vaginal cream Place 1 Applicatorful at bedtime vaginally.    [provider]  estradiol (ESTRACE) 1 MG tablet Take 0.5 mg daily by mouth.     [provider]  fluticasone (FLONASE) 50 MCG/ACT nasal spray SHAKE LIQUID AND USE 2 SPRAYS IN EACH NOSTRIL EVERY DAY 10/17/19   Einar Pheasant, MD  hydrocortisone (ANUSOL-HC) 25  MG suppository Place 1 suppository (25 mg total) rectally 2 (two) times daily. 10/31/19   Einar Pheasant, MD  montelukast (SINGULAIR) 10 MG tablet Take 10 mg at bedtime by mouth.  09/29/16   [provider]  Multiple Vitamin (MULTIVITAMIN) tablet Take 1 tablet by mouth daily.    [provider]  pantoprazole (PROTONIX) 40 MG tablet TAKE 1 TABLET BY MOUTH EVERY DAY 01/29/20   Einar Pheasant, MD  Probiotic Product (PROBIOTIC ADVANCED PO) Take 1 capsule daily by mouth.     [provider]  progesterone (PROMETRIUM) 100 MG capsule Take 100 mg daily by mouth. 01/22/17   [provider]  propranolol (INDERAL) 20 MG tablet Take 1 tablet (20 mg total) by mouth 3 (three) times daily as needed. 10/31/19   Einar Pheasant, MD  traZODone (DESYREL) 50 MG tablet take 1/2 to 1 tablets by mouth at bedtime if needed for sleep 12/27/17   Einar Pheasant, MD    Allergies Prednisone and Ceftin [cefuroxime axetil]  Family History  Problem Relation Age of Onset  . Brain cancer Mother   . ALS Father   . Diabetes Brother        multiple  . Heart disease Son        myocardial infarction, s/p CABG  . Throat cancer Brother   . Breast cancer Neg Hx     Social History Social History   Tobacco Use  . Smoking status: Never Smoker  . Smokeless tobacco: Never Used  Vaping Use  . Vaping Use: Never used  Substance Use Topics  . Alcohol use: No    Alcohol/week: 0.0 standard drinks  . Drug use: No      Review of Systems Constitutional: No fever/chills Eyes: No visual changes. ENT: No sore throat. Cardiovascular: Positive chest pain Respiratory: Denies shortness of breath. Gastrointestinal: No abdominal pain.  No nausea, no vomiting.  No diarrhea.  No constipation. Genitourinary: Negative for dysuria. Musculoskeletal: Negative for back pain. Skin: Negative for rash. Neurological: Negative for headaches, focal weakness or numbness. All other ROS negative ____________________________________________   PHYSICAL EXAM:  VITAL SIGNS: Blood pressure 114/81, pulse 69, temperature 97.6 F (36.4 C), temperature source Oral, resp. rate 18, height 5\' 6"  (1.676 m), weight 63.5 kg, last menstrual period 07/15/2012, SpO2 99 %.  Constitutional: Alert and oriented. Well appearing and in no acute distress. Eyes: Conjunctivae are normal. EOMI. Head: Atraumatic. Nose: No congestion/rhinnorhea. Mouth/Throat: Mucous membranes are moist.   Neck: No stridor. Trachea Midline.  FROM Cardiovascular: regular fast. Grossly normal heart sounds.  Good peripheral circulation. Respiratory: Normal respiratory effort.  No retractions. Lungs CTAB. Gastrointestinal: Soft and nontender. No distention. No abdominal bruits.  Musculoskeletal: No lower extremity tenderness nor edema.  No joint effusions. Neurologic:  Normal speech and language. No gross focal neurologic deficits are appreciated.  Skin:  Skin is warm, dry and intact. No rash noted. Psychiatric: Mood and affect are normal. Speech and behavior are normal. GU: Deferred   ____________________________________________   LABS (all labs ordered are listed, but only abnormal results are displayed)  Labs Reviewed  CBC WITH DIFFERENTIAL/PLATELET - Abnormal; Notable for the following components:      Result Value   Hemoglobin 15.4 (*)    All other components within normal limits  COMPREHENSIVE METABOLIC PANEL - Abnormal; Notable for the following components:   Glucose, Bld 142 (*)    All other components within normal limits  T4, FREE - Abnormal; Notable for the following components:   Free  T4 1.19 (*)    All other components within normal limits  TROPONIN I (HIGH SENSITIVITY) - Abnormal; Notable for the following components:   Troponin I (High Sensitivity) 26 (*)    All other components within normal limits  RESP PANEL BY RT-PCR (FLU A&B, COVID) ARPGX2  MAGNESIUM  TSH  TROPONIN I (HIGH SENSITIVITY)   ____________________________________________   ED ECG REPORT I, Vanessa Kingston, the attending physician, personally viewed and interpreted this ECG.  Wide-complex tachycardia rate of 225 concerning for VT versus super ventricular tachycardia with aberrancy Grossly abnormal intervals, unable to fully assess ST and T waves due to above  Repeat EKG is sinus tachycardia rate of 105, no ST elevation, no T wave inversions however does have frequent PVCs  ____________________________________________  RADIOLOGY Robert Bellow, personally viewed and evaluated these images (plain radiographs) as part of my medical decision making, as well as reviewing the written report by the radiologist.  ED MD interpretation: Clear lungs  Official radiology report(s): DG Chest Portable 1 View  Result Date: 07/15/2020 CLINICAL DATA:  Chest pain. EXAM: PORTABLE CHEST 1 VIEW COMPARISON:  None. FINDINGS: The heart size and mediastinal contours are within normal limits. Both lungs are clear. No pneumothorax or pleural effusion is noted. The visualized skeletal structures are unremarkable. IMPRESSION: No active disease. Electronically Signed   By: Marijo Conception M.D.   On: 07/15/2020 08:28    ____________________________________________   PROCEDURES  Procedure(s) performed (including Critical Care):  .1-3 Lead EKG Interpretation Performed by: Vanessa [place], MD Authorized by: Vanessa Healy, MD     Interpretation: abnormal     Rhythm: sinus rhythm     Ectopy: PVCs     Conduction: normal   Comments:     Patient initially presents with wide-complex tachycardia but converted to sinus tachycardia and occasionally sinus     ____________________________________________   INITIAL IMPRESSION / ASSESSMENT AND PLAN / ED COURSE   Theresa Buck was evaluated in Emergency Department on 07/15/2020 for the symptoms described in the history of present illness. She was evaluated in the context of the global COVID-19 pandemic, which necessitated consideration that the patient might be at risk for infection with the SARS-CoV-2 virus that causes COVID-19. Institutional protocols and algorithms that pertain to the evaluation of patients at risk for COVID-19 are in a state of rapid change based on information released by regulatory bodies including the CDC and federal and state organizations. These policies and algorithms were followed during the patient's care in the ED.    Most Likely DDx:  -Patient comes in with chest pain  and noted to be in wide-complex tachycardia.  We are working patient up to the monitor and the pads when patient self converts to sinus tachycardia and states that she is starting to feel better.  DDx that was also considered d/t potential to cause harm, but was found less likely based on history and physical (as detailed above): -PNA (no fevers, cough but CXR to evaluate) -PNX (reassured with equal b/l breath sounds, CXR to evaluate) -Symptomatic anemia (will get H&H) -Pulmonary embolism as no sob at rest, not pleuritic in nature, no hypoxia, no leg swelling -Aortic Dissection as no tearing pain and no radiation to the mid back, pulses equal -Pericarditis no rub on exam, EKG changes or hx to suggest dx -Tamponade (no notable SOB, tachycardic, hypotensive) -Esophageal rupture (no h/o diffuse vomitting/no crepitus)  8:37 AM discussed with Dr. Rockey Situ who reviewed the EKG  with Dr. Caryl Comes it looks more like SVT then vtach .  They recommend getting an echo if the echo looks okay patient can be potentially discharged home.  Patient's thyroid is slightly elevated at 1.19.  I did discuss this with patient she will follow this up with her primary care doctor.  Her troponin did slightly elevate from 4-26 but this is most likely demand from the tachycardia.  I did discuss with Dr. Rockey Situ and he reviewed her echocardiogram.  Discussed with Dr. Rockey Situ who stated that the EF was slightly down at 45 to 50%.  He is going to set up an outpatient cardiac CTA and this will help EP.  He is also can have her follow-up with EP for possible ablation.  He recommended starting metoprolol tartrate 25 twice daily until follow-up to help try to prevent recurrence.  I did discuss the above with patient she feels comfortable going home and she has not had further episodes in over 3 hours.    ____________________________________________   FINAL CLINICAL IMPRESSION(S) / ED DIAGNOSES   Final diagnoses:  SVT  (supraventricular tachycardia) (HCC)     MEDICATIONS GIVEN DURING THIS VISIT:  Medications  metoprolol tartrate (LOPRESSOR) tablet 25 mg (has no administration in time range)     ED Discharge Orders         Ordered    metoprolol tartrate (LOPRESSOR) 25 MG tablet  2 times daily        07/15/20 1056           Note:  This document was prepared using Dragon voice recognition software and may include unintentional dictation errors.   Vanessa [place], MD 07/15/20 1058

## 2020-07-15 NOTE — ED Notes (Signed)
Pt up to use bathroom 

## 2020-07-15 NOTE — Progress Notes (Signed)
*  PRELIMINARY RESULTS* Echocardiogram 2D Echocardiogram has been performed.  Theresa Buck 07/15/2020, 10:07 AM

## 2020-07-15 NOTE — ED Notes (Addendum)
Pt self converted from SVT to NS

## 2020-07-15 NOTE — ED Notes (Signed)
ECHO at bedside.

## 2020-07-15 NOTE — Discharge Instructions (Addendum)
Your thyroid level was slightly elevated at 1.19 Free T4.  You need to follow this up with your primary care doctor to decide if you need to be started on any medications or to see if you need to follow-up with endocrine doctor.  Dr. Rockey Situ from cardiology saw you and diagnosed you with you with supraventricular tachycardia.  He recommend taking metoprolol 25 mg twice a day.  Your cardiac function was slightly low hie will schedule your outpatient CT scan and have you follow-up with an electrophysiologist to discuss possible ablation

## 2020-07-15 NOTE — Telephone Encounter (Signed)
Was able to speak with via phone after her ED visit today. Went in for CP and palpitations, noted SVT with reate in 220s. Dr. Rockey Situ consulted pt in the ED, he advised  "Seen in the emergency room March 24 with SVT heart rate 220 bpm  Can we schedule close follow-up with Dr. Quentin Ore for consideration of ablation  Started on metoprolol tartrate 25 twice daily   Also needs a cardiac CTA as ejection fraction mildly low, 45 to 50% "  Advised pt that CTA has been order, takes a few weeks to schedule d/t waiting on insurance approval. Will review instructions at upcoming appt. Pt verbalized understanding, could take a few weeks to have schedule.   Reviewed new script for metoprolol tartrate 25 mg BID, had first dose while in the ED, will pick up script today, will taken 2nd dose this evening.  Understands need for referral for EP provider based on her SVT and echo while in the ED with low EF. Was able to schedule for April.   Otherwise all questions or concerns were address and no additional concerns at this time. Agreeable to plan, will call back for anything further.    Dr. Rockey Situ 3/29 at 3:20pm Dr. Quentin Ore 4/6 at 2:20 pm

## 2020-07-16 DIAGNOSIS — M9905 Segmental and somatic dysfunction of pelvic region: Secondary | ICD-10-CM | POA: Diagnosis not present

## 2020-07-16 DIAGNOSIS — M9903 Segmental and somatic dysfunction of lumbar region: Secondary | ICD-10-CM | POA: Diagnosis not present

## 2020-07-16 DIAGNOSIS — M5417 Radiculopathy, lumbosacral region: Secondary | ICD-10-CM | POA: Diagnosis not present

## 2020-07-16 DIAGNOSIS — M41125 Adolescent idiopathic scoliosis, thoracolumbar region: Secondary | ICD-10-CM | POA: Diagnosis not present

## 2020-07-19 ENCOUNTER — Telehealth: Payer: Self-pay | Admitting: Cardiovascular Disease

## 2020-07-19 NOTE — Telephone Encounter (Signed)
Patient calling reporting a sharp pain when taking a deep breath or when she moves wrong. Patient unsure if it is a strained muscle or anything.  Patient does have appt tomorrow w. Rockey Situ

## 2020-07-19 NOTE — Progress Notes (Signed)
Cardiology Office Note  Date:  07/20/2020   ID:  Theresa Buck, DOB December 16, 1955, MRN [id number]  PCP:  Einar Pheasant, MD   Chief Complaint  Patient presents with  . Follow-up    Follow up from ED visit for SVT. Medications verbally reviewed with patient.     HPI:  Theresa Buck is a 65 y.o. female with a hx of  paroxysmal tachycardia dating back 10 years,  Previously seen in 2014 for paroxysmal tachycardia, having severe episodes at that time, lost to follow-up With continued episodes and recent presentation to the hospital last month March 2022 EKG documented wide-complex tachycardia concerning for SVT With associated diaphoresis, near syncope, breaking on its own Who presents to establish care in the office, follow-up of her SVT  July 15, 2020, Had acute onset chest pain, shortness of breath, chest pain jaw pain, Symptoms did not break, she took propranolol, no improvement in her symptoms, Friend brought her into the emergency room, noted to have wide-complex tachycardia 220 bpm  EKG discussed with EP, concerning for SVT, rate 220 bpm  She did report symptoms date back 10 years but reports symptoms have been getting progressively worse  Work-up in the hospital including Minimal elevation of troponin, other lab work within normal range Chest x-ray nonacute  echo 1. Left ventricular ejection fraction, by estimation, is 45 to 50%. The  left ventricle has mildly decreased function. The left ventricle  demonstrates global hypokinesis. Left ventricular diastolic parameters are  consistent with Grade I diastolic  dysfunction (impaired relaxation). The average left ventricular global  longitudinal strain is -12.5 %. The global longitudinal strain is  abnormal.  2. Right ventricular systolic function is normal. The right ventricular  size is normal.  3. The mitral valve is normal in structure. Mild to moderate mitral valve  regurgitation.  4. Tricuspid valve  regurgitation is mild to moderate.   EKG personally reviewed by myself on todays visit Shows normal sinus rhythm with rate 57 bpm no significant ST-T wave changes Other past medical history reviewed  severe episode of tachycardia in February 2014 she described her event has acute onset, very fast, pounding heartbeat for least 10 minutes. She had just eaten when the events happened..It resolved without intervention, though she had to sit down. She was very symptomatic. This was the longest episode. Prior episodes did not last as long. Total episodes probably 10 or less. No triggers such as caffeine or exercise.    PMH:   has a past medical history of Allergy, Anxiety, Arthritis, Deviated septum, Deviated septum, GERD (gastroesophageal reflux disease), H/O sinusitis, Headache, degenerative disc disease, and SVT (supraventricular tachycardia) ([place]).  PSH:    Past Surgical History:  Procedure Laterality Date  . BACK SURGERY  09/26/2013   LUMBAR  . COLONOSCOPY  08/03/2015   Dr Vira Agar  . HEMORRHOID SURGERY N/A 03/13/2017   Procedure: HEMORRHOIDECTOMY;  Surgeon: Christene Lye, MD;  Location: [facility] ORS;  Service: General;  Laterality: N/A;  . UPPER GI ENDOSCOPY  08/03/2015   Dr Vira Agar    Current Outpatient Medications  Medication Sig Dispense Refill  . Artificial Tear Solution (GENTEAL TEARS) 0.1-0.2-0.3 % SOLN Place 1 drop 3 times/day as needed-between meals & bedtime into both eyes (dry eyes).    . Biotin 5000 MCG TABS Take 1 tablet daily by mouth.     . Calcium Carb-Cholecalciferol (CALCIUM + D3 PO) Take 1 tablet daily by mouth.    . estradiol (ESTRACE) 0.1 MG/GM vaginal cream Place  1 Applicatorful at bedtime vaginally.    Marland Kitchen estradiol (ESTRACE) 1 MG tablet Take 0.5 mg daily by mouth.     . fluticasone (FLONASE) 50 MCG/ACT nasal spray SHAKE LIQUID AND USE 2 SPRAYS IN EACH NOSTRIL EVERY DAY 16 g 5  . hydrocortisone (ANUSOL-HC) 25 MG suppository Place 1 suppository (25 mg total)  rectally 2 (two) times daily. 20 suppository 0  . metoprolol tartrate (LOPRESSOR) 25 MG tablet Take 1 tablet (25 mg total) by mouth 2 (two) times daily. 60 tablet 0  . montelukast (SINGULAIR) 10 MG tablet Take 10 mg at bedtime by mouth.   1  . Multiple Vitamin (MULTIVITAMIN) tablet Take 1 tablet by mouth daily.    . pantoprazole (PROTONIX) 40 MG tablet TAKE 1 TABLET BY MOUTH EVERY DAY 90 tablet 3  . Probiotic Product (PROBIOTIC ADVANCED PO) Take 1 capsule daily by mouth.     . progesterone (PROMETRIUM) 100 MG capsule Take 100 mg daily by mouth.  0  . progesterone (PROMETRIUM) 100 MG capsule Take 200 mg by mouth at bedtime.    . propranolol (INDERAL) 20 MG tablet Take 1 tablet (20 mg total) by mouth 3 (three) times daily as needed. 90 tablet 0  . traZODone (DESYREL) 50 MG tablet take 1/2 to 1 tablets by mouth at bedtime if needed for sleep 30 tablet 3   No current facility-administered medications for this visit.     Allergies:   Prednisone and Ceftin [cefuroxime axetil]   Social History:  The patient  reports that she has never smoked. She has never used smokeless tobacco. She reports that she does not drink alcohol and does not use drugs.   Family History:   family history includes ALS in her father; Brain cancer in her mother; Diabetes in her brother; Heart disease in her son; Throat cancer in her brother.    Review of Systems: Review of Systems  Constitutional: Negative.   HENT: Negative.   Respiratory: Negative.   Cardiovascular: Negative.   Gastrointestinal: Negative.   Musculoskeletal: Negative.   Neurological: Negative.   Psychiatric/Behavioral: Negative.   All other systems reviewed and are negative.   PHYSICAL EXAM: VS:  BP 118/74 (BP Location: Left Arm, Patient Position: Sitting, Cuff Size: Normal)   Pulse (!) 57   Ht 5\' 6"  (1.676 m)   Wt 139 lb (63 kg)   LMP 07/15/2012   SpO2 98%   BMI 22.44 kg/m  , BMI Body mass index is 22.44 kg/m. GEN: Well nourished, well  developed, in no acute distress HEENT: normal Neck: no JVD, carotid bruits, or masses Cardiac: RRR; no murmurs, rubs, or gallops,no edema  Respiratory:  clear to auscultation bilaterally, normal work of breathing GI: soft, nontender, nondistended, + BS MS: no deformity or atrophy Skin: warm and dry, no rash Neuro:  Strength and sensation are intact Psych: euthymic mood, full affect   Recent Labs: 07/15/2020: ALT 20; BUN 19; Creatinine, Ser 0.77; Hemoglobin 15.4; Magnesium 2.1; Platelets 214; Potassium 4.3; Sodium 137; TSH 2.122    Lipid Panel Lab Results  Component Value Date   CHOL 199 03/25/2020   HDL 51.50 03/25/2020   LDLCALC 129 (H) 03/25/2020   TRIG 95.0 03/25/2020      Wt Readings from Last 3 Encounters:  07/20/20 139 lb (63 kg)  07/15/20 140 lb (63.5 kg)  03/05/20 140 lb (63.5 kg)       ASSESSMENT AND PLAN:  Problem List Items Addressed This Visit   None   Visit  Diagnoses    SVT (supraventricular tachycardia) (HCC)    -  Primary   PVC (premature ventricular contraction)       Dilated cardiomyopathy ([place])         1. Wide-complex tachycardia/SVT Worsening symptoms she reports over the past several months, March 24 with the worst episode to date, did not terminate and was very symptomatic with chest tightness, shortness of breath, neck pain, near syncope Case discussed with Dr. Caryl Comes, EP. rhythm concerning for SVT -We will continue metoprolol tartrate 25 twice daily, propranolol as needed, Scheduled to see Dr. Quentin Ore for consideration of ablation next week  2.  Cardiomyopathy/depressed ejection fraction Unable to exclude tachycardia mediated, stress mediated To rule out structural heart disease, coronary disease, cardiac CTA has been ordered, waiting for insurance approval  3.  PVCs Appears to be RV outflow tract based We will defer to EP Beta-blocker as above    Total encounter time more than 25 minutes  Greater than 50% was spent in counseling  and coordination of care with the patient    Signed, Esmond Plants, M.D., Ph.D. [place], [place]

## 2020-07-19 NOTE — Telephone Encounter (Signed)
Able to reach back out to pt regarding her concern, reports CP only when taking a deep breath and sharp pain with movement. Reports the pain has eased off since when she first called this morning. Advised could be lung spasm with the pain while deep breathing and the pain with movement could be more musculoskeletal in nature. Denies SOB, dizziness, or radiation of pain. Did advised if pain changes then may want to seek ED for cardiac work-up following her recent SVT, otherwise will evaluate pt tomorrow at f/u appt with Dr. Rockey Situ. Pt verbalized understanding, grateful for the return call, nothing further at this time.

## 2020-07-20 ENCOUNTER — Encounter: Payer: Self-pay | Admitting: Cardiovascular Disease

## 2020-07-20 ENCOUNTER — Other Ambulatory Visit: Payer: Self-pay

## 2020-07-20 ENCOUNTER — Ambulatory Visit: Payer: BLUE CROSS/BLUE SHIELD | Admitting: Cardiovascular Disease

## 2020-07-20 VITALS — BP 118/74 | HR 57 | Ht 66.0 in | Wt 139.0 lb

## 2020-07-20 DIAGNOSIS — I493 Ventricular premature depolarization: Secondary | ICD-10-CM

## 2020-07-20 DIAGNOSIS — I471 Supraventricular tachycardia: Secondary | ICD-10-CM

## 2020-07-20 DIAGNOSIS — I420 Dilated cardiomyopathy: Secondary | ICD-10-CM

## 2020-07-20 NOTE — Patient Instructions (Addendum)
Medication Instructions:  No changes  If you need a refill on your cardiac medications before your next appointment, please call your pharmacy.    Lab work: No new labs needed   If you have labs (blood work) drawn today and your tests are completely normal, you will receive your results only by: . MyChart Message (if you have MyChart) OR . A paper copy in the mail If you have any lab test that is abnormal or we need to change your treatment, we will call you to review the results.   Testing/Procedures: No new testing needed   Follow-Up: At [facility], you and your health needs are our priority.  As part of our continuing mission to provide you with exceptional heart care, we have created designated Provider Care Teams.  These Care Teams include your primary Cardiologist (physician) and Advanced Practice Providers (APPs -  Physician Assistants and Nurse Practitioners) who all work together to provide you with the care you need, when you need it.  . You will need a follow up appointment as needed  . Providers on your designated Care Team:   . Christopher Berge, NP . Ryan Dunn, PA-C . Jacquelyn Visser, PA-C  Any Other Special Instructions Will Be Listed Below (If Applicable).  COVID-19 Vaccine Information can be found at: [email or web address] For questions related to vaccine distribution or appointments, please email [email or web address] or call [phone].     

## 2020-07-26 ENCOUNTER — Ambulatory Visit: Payer: BLUE CROSS/BLUE SHIELD | Admitting: Cardiovascular Disease

## 2020-07-27 MED ORDER — DOXYCYCLINE MONOHYDRATE 100 MG PO CAPS: 100.0000 mg | ORAL_CAPSULE | Freq: Two times a day (BID) | ORAL | 0 refills | Status: AC

## 2020-07-27 NOTE — Telephone Encounter (Signed)
Spoke to patient and advised we would send in Doxycycline 100mg  1 by mouth 2 times a day with food and drink for 10 days.  Also advised patient she could apply warm compresses.  Sent in Doxycycline to [facility] in [place]

## 2020-07-28 ENCOUNTER — Emergency Department: Payer: BLUE CROSS/BLUE SHIELD

## 2020-07-28 ENCOUNTER — Other Ambulatory Visit: Payer: Self-pay | Admitting: Cardiology

## 2020-07-28 ENCOUNTER — Emergency Department
Admission: EM | Admit: 2020-07-28 | Discharge: 2020-07-28 | Disposition: A | Discharge status: Home / Self Care | Payer: BLUE CROSS/BLUE SHIELD | Attending: Emergency Medicine | Admitting: Emergency Medicine

## 2020-07-28 ENCOUNTER — Ambulatory Visit (INDEPENDENT_AMBULATORY_CARE_PROVIDER_SITE_OTHER): Payer: BLUE CROSS/BLUE SHIELD

## 2020-07-28 ENCOUNTER — Ambulatory Visit: Payer: BLUE CROSS/BLUE SHIELD | Admitting: Cardiology

## 2020-07-28 ENCOUNTER — Encounter: Payer: Self-pay | Admitting: Cardiology

## 2020-07-28 ENCOUNTER — Telehealth: Payer: Self-pay | Admitting: Cardiovascular Disease

## 2020-07-28 ENCOUNTER — Other Ambulatory Visit: Payer: Self-pay

## 2020-07-28 ENCOUNTER — Telehealth (HOSPITAL_COMMUNITY): Payer: Self-pay | Admitting: *Deleted

## 2020-07-28 VITALS — BP 106/68 | HR 80 | Ht 66.0 in | Wt 138.0 lb

## 2020-07-28 DIAGNOSIS — I420 Dilated cardiomyopathy: Secondary | ICD-10-CM

## 2020-07-28 DIAGNOSIS — I428 Other cardiomyopathies: Secondary | ICD-10-CM

## 2020-07-28 DIAGNOSIS — I471 Supraventricular tachycardia: Secondary | ICD-10-CM | POA: Diagnosis not present

## 2020-07-28 DIAGNOSIS — I493 Ventricular premature depolarization: Secondary | ICD-10-CM

## 2020-07-28 DIAGNOSIS — R0789 Other chest pain: Secondary | ICD-10-CM | POA: Diagnosis not present

## 2020-07-28 DIAGNOSIS — R0602 Shortness of breath: Secondary | ICD-10-CM | POA: Diagnosis not present

## 2020-07-28 DIAGNOSIS — Z8616 Personal history of COVID-19: Secondary | ICD-10-CM | POA: Diagnosis not present

## 2020-07-28 DIAGNOSIS — F419 Anxiety disorder, unspecified: Secondary | ICD-10-CM | POA: Diagnosis not present

## 2020-07-28 DIAGNOSIS — R079 Chest pain, unspecified: Secondary | ICD-10-CM | POA: Diagnosis not present

## 2020-07-28 DIAGNOSIS — R002 Palpitations: Secondary | ICD-10-CM

## 2020-07-28 LAB — CBC WITH DIFFERENTIAL/PLATELET
Abs Immature Granulocytes: 0.03 10*3/uL (ref 0.00–0.07)
Basophils Absolute: 0 10*3/uL (ref 0.0–0.1)
Basophils Relative: 1 %
Eosinophils Absolute: 0.2 10*3/uL (ref 0.0–0.5)
Eosinophils Relative: 2 %
HCT: 43.5 % (ref 36.0–46.0)
Hemoglobin: 14.4 g/dL (ref 12.0–15.0)
Immature Granulocytes: 0 %
Lymphocytes Relative: 24 %
Lymphs Abs: 1.8 10*3/uL (ref 0.7–4.0)
MCH: 31.5 pg (ref 26.0–34.0)
MCHC: 33.1 g/dL (ref 30.0–36.0)
MCV: 95.2 fL (ref 80.0–100.0)
Monocytes Absolute: 0.5 10*3/uL (ref 0.1–1.0)
Monocytes Relative: 7 %
Neutro Abs: 4.8 10*3/uL (ref 1.7–7.7)
Neutrophils Relative %: 66 %
Platelets: 225 10*3/uL (ref 150–400)
RBC: 4.57 MIL/uL (ref 3.87–5.11)
RDW: 12 % (ref 11.5–15.5)
WBC: 7.4 10*3/uL (ref 4.0–10.5)
nRBC: 0 % (ref 0.0–0.2)

## 2020-07-28 LAB — COMPREHENSIVE METABOLIC PANEL
ALT: 16 U/L (ref 0–44)
AST: 17 U/L (ref 15–41)
Albumin: 4.1 g/dL (ref 3.5–5.0)
Alkaline Phosphatase: 59 U/L (ref 38–126)
Anion gap: 6 (ref 5–15)
BUN: 22 mg/dL (ref 8–23)
CO2: 27 mmol/L (ref 22–32)
Calcium: 9.4 mg/dL (ref 8.9–10.3)
Chloride: 106 mmol/L (ref 98–111)
Creatinine, Ser: 0.7 mg/dL (ref 0.44–1.00)
GFR, Estimated: >60 mL/min (ref >60)
Glucose, Bld: 95 mg/dL (ref 70–99)
Potassium: 4.4 mmol/L (ref 3.5–5.1)
Sodium: 139 mmol/L (ref 135–145)
Total Bilirubin: 0.9 mg/dL (ref 0.3–1.2)
Total Protein: 7.3 g/dL (ref 6.5–8.1)

## 2020-07-28 LAB — BRAIN NATRIURETIC PEPTIDE: B Natriuretic Peptide: 32.2 pg/mL (ref 0.0–100.0)

## 2020-07-28 LAB — MAGNESIUM: Magnesium: 2.1 mg/dL (ref 1.7–2.4)

## 2020-07-28 LAB — TROPONIN I (HIGH SENSITIVITY)
Troponin I (High Sensitivity): 5 ng/L (ref <18)
Troponin I (High Sensitivity): 3 ng/L (ref <18)

## 2020-07-28 MED ORDER — METOPROLOL TARTRATE 25 MG PO TABS: 37.5000 mg | ORAL_TABLET | Freq: Two times a day (BID) | ORAL | 3 refills | Status: DC

## 2020-07-28 MED ORDER — PROPRANOLOL HCL 20 MG PO TABS: 20.0000 mg | ORAL_TABLET | Freq: Three times a day (TID) | ORAL | 0 refills | Status: DC | PRN

## 2020-07-28 NOTE — ED Notes (Signed)
Pt pressed call light, states she was having some SHOB, came on out of nowhere and lasted around 30 seconds. Sats, HR, and bp wnl. Will continue to monitor.

## 2020-07-28 NOTE — ED Notes (Signed)
Pt unable to sign d/c signature due to failed topaz. Reviewed d/c instructions. Pt verbalized understanding.

## 2020-07-28 NOTE — Patient Instructions (Addendum)
Medication Instructions:  Your physician has recommended you make the following change in your medication:   ** Increase Metoprolol Tartrate to 37.5mg  (1.5 tablets by mouth twice daily.  *If you need a refill on your cardiac medications before your next appointment, please call your pharmacy*   Lab Work: None ordered.  If you have labs (blood work) drawn today and your tests are completely normal, you will receive your results only by: Marland Kitchen MyChart Message (if you have MyChart) OR . A paper copy in the mail If you have any lab test that is abnormal or we need to change your treatment, we will call you to review the results.   Testing/Procedures: [place]- Long Term Monitor Instructions   Your physician has requested you wear your ZIO patch monitor___7____days.   This is a single patch monitor.  [employer] supplies one patch monitor per enrollment.  Additional stickers are not available.   Please do not apply patch if you will be having a Nuclear Stress Test, Echocardiogram, Cardiac CT, MRI, or Chest Xray during the time frame you would be wearing the monitor. The patch cannot be worn during these tests.  You cannot remove and re-apply the ZIO XT patch monitor.   Your ZIO patch monitor will be sent USPS Priority mail from [facility] directly to your home address. The monitor may also be mailed to a PO BOX if home delivery is not available.   It may take 3-5 days to receive your monitor after you have been enrolled.   Once you have received you monitor, please review enclosed instructions.  Your monitor has already been registered assigning a specific monitor serial # to you.   Applying the monitor   Shave hair from upper left chest.   Hold abrader disc by orange tab.  Rub abrader in 40 strokes over left upper chest as indicated in your monitor instructions.   Clean area with 4 enclosed alcohol pads .  Use all pads to assure are is cleaned thoroughly.  Let dry.   Apply patch  as indicated in monitor instructions.  Patch will be place under collarbone on left side of chest with arrow pointing upward.   Rub patch adhesive wings for 2 minutes.Remove white label marked "1".  Remove white label marked "2".  Rub patch adhesive wings for 2 additional minutes.   While looking in a mirror, press and release button in center of patch.  A small green light will flash 3-4 times .  This will be your only indicator the monitor has been turned on.     Do not shower for the first 24 hours.  You may shower after the first 24 hours.   Press button if you feel a symptom. You will hear a small click.  Record Date, Time and Symptom in the Patient Log Book.   When you are ready to remove patch, follow instructions on last 2 pages of Patient Log Book.  Stick patch monitor onto last page of Patient Log Book.   Place Patient Log Book in [place] box.  Use locking tab on box and tape box closed securely.  The [employer] and [employer] has [employer] on it.  Please place in mailbox as soon as possible.  Your physician should have your test results approximately 7 days after the monitor has been mailed back to [facility].   Call [place] at [phone] if you have questions regarding your ZIO XT patch monitor.  Call them immediately  if you see an orange light blinking on your monitor.   If your monitor falls off in less than 4 days contact our Monitor department at [phone].  If your monitor becomes loose or falls off after 4 days call [employer] at [phone] for suggestions on securing your monitor.   Your physician has recommended that you have an ablation. Catheter ablation is a medical procedure used to treat some cardiac arrhythmias (irregular heartbeats). During catheter ablation, a long, thin, flexible tube is put into a blood vessel in your groin (upper thigh), or neck. This tube is called an ablation catheter. It is then guided to your heart through the blood  vessel. Radio frequency waves destroy small areas of heart tissue where abnormal heartbeats may cause an arrhythmia to start. Please see the instruction sheet given to you today.    Follow-Up: At [facility], you and your health needs are our priority.  As part of our continuing mission to provide you with exceptional heart care, we have created designated Provider Care Teams.  These Care Teams include your primary Cardiologist (physician) and Advanced Practice Providers (APPs -  Physician Assistants and Nurse Practitioners) who all work together to provide you with the care you need, when you need it.  We recommend signing up for the patient portal called "MyChart".  Sign up information is provided on this After Visit Summary.  MyChart is used to connect with patients for Virtual Visits (Telemedicine).  Patients are able to view lab/test results, encounter notes, upcoming appointments, etc.  Non-urgent messages can be sent to your provider as well.   To learn more about what you can do with MyChart, go to [email or web address].    Your next appointment:   To be scheduled   Other Instructions  Ablation dates to consider: 05/26, 05/31 06/02 06/10   Cardiac Ablation Cardiac ablation is a procedure to destroy (ablate) some heart tissue that is sending bad signals. These bad signals cause problems in heart rhythm. The heart has many areas that make these signals. If there are problems in these areas, they can make the heart beat in a way that is not normal. Destroying some tissues can help make the heart rhythm normal. Tell your doctor about:  Any allergies you have.  All medicines you are taking. These include vitamins, herbs, eye drops, creams, and over-the-counter medicines.  Any problems you or family members have had with medicines that make you fall asleep (anesthetics).  Any blood disorders you have.  Any surgeries you have had.  Any medical conditions you have, such as kidney  failure.  Whether you are pregnant or may be pregnant. What are the risks? This is a safe procedure. But problems may occur, including:  Infection.  Bruising and bleeding.  Bleeding into the chest.  Stroke or blood clots.  Damage to nearby areas of your body.  Allergies to medicines or dyes.  The need for a pacemaker if the normal system is damaged.  Failure of the procedure to treat the problem. What happens before the procedure? Medicines Ask your doctor about:  Changing or stopping your normal medicines. This is important.  Taking aspirin and ibuprofen. Do not take these medicines unless your doctor tells you to take them.  Taking other medicines, vitamins, herbs, and supplements. General instructions  Follow instructions from your doctor about what you cannot eat or drink.  Plan to have someone take you home from the hospital or clinic.  If you will be going home right after  the procedure, plan to have someone with you for 24 hours.  Ask your doctor what steps will be taken to prevent infection. What happens during the procedure?  An IV tube will be put into one of your veins.  You will be given a medicine to help you relax.  The skin on your neck or groin will be numbed.  A cut (incision) will be made in your neck or groin. A needle will be put through your cut and into a large vein.  A tube (catheter) will be put into the needle. The tube will be moved to your heart.  Dye may be put through the tube. This helps your doctor see your heart.  Small devices (electrodes) on the tube will send out signals.  A type of energy will be used to destroy some heart tissue.  The tube will be taken out.  Pressure will be held on your cut. This helps stop bleeding.  A bandage will be put over your cut. The exact procedure may vary among doctors and hospitals.   What happens after the procedure?  You will be watched until you leave the hospital or clinic. This  includes checking your heart rate, breathing rate, oxygen, and blood pressure.  Your cut will be watched for bleeding. You will need to lie still for a few hours.  Do not drive for 24 hours or as long as your doctor tells you. Summary  Cardiac ablation is a procedure to destroy some heart tissue. This is done to treat heart rhythm problems.  Tell your doctor about any medical conditions you may have. Tell him or her about all medicines you are taking to treat them.  This is a safe procedure. But problems may occur. These include infection, bruising, bleeding, and damage to nearby areas of your body.  Follow what your doctor tells you about food and drink. You may also be told to change or stop some of your medicines.  After the procedure, do not drive for 24 hours or as long as your doctor tells you. This information is not intended to replace advice given to you by your health care provider. Make sure you discuss any questions you have with your health care provider. Document Revised: 03/13/2019 Document Reviewed: 03/13/2019 Elsevier Patient Education  2021 [employer].

## 2020-07-28 NOTE — Progress Notes (Signed)
Electrophysiology Office Note:    Date:  07/28/2020   ID:  Theresa Buck, DOB April 30, 1955, MRN [id number]  PCP:  Einar Pheasant, MD  [place] Cardiologist:  No primary care provider on file.  [facility] Electrophysiologist:  None   Referring MD: Einar Pheasant, MD   Chief Complaint: Tachycardia  History of Present Illness:    Theresa Buck is a 65 y.o. female who presents for an evaluation of tachycardia at the request of Dr. Nicki Reaper..   The patient was last seen by Dr. Rockey Situ on July 20, 2020.  She has a long history of paroxysmal tachycardia that dates back least 10 years.  The EKG was thought to represent supraventricular tachycardia with aberrancy.  She had been previously treated with propranolol.  She presented to the emergency department on March 24 with more frequent episodes of palpitations.  When she woke up on the day of her ER presentation the palpitations did not go away.  She was ultimately seen in the ER and found to be in a wide-complex tachycardia with a ventricular rate of 220 bpm.  She tells me that she is never had a syncopal episode.  She thinks the episodes of tachycardia are becoming more frequent.  The episodes were started when she was in her teens but have become significantly more frequent.    Past Medical History:  Diagnosis Date  . Allergy    recurring sinus problems  . Anxiety   . Arthritis   . Deviated septum   . Deviated septum   . GERD (gastroesophageal reflux disease)   . H/O sinusitis   . Headache    H/O MIGRAINES  . Hx of degenerative disc disease   . SVT (supraventricular tachycardia) (HCC)     Past Surgical History:  Procedure Laterality Date  . BACK SURGERY  09/26/2013   LUMBAR  . COLONOSCOPY  08/03/2015   Dr Vira Agar  . HEMORRHOID SURGERY N/A 03/13/2017   Procedure: HEMORRHOIDECTOMY;  Surgeon: Christene Lye, MD;  Location: [facility] ORS;  Service: General;  Laterality: N/A;  . UPPER GI ENDOSCOPY  08/03/2015   Dr  Vira Agar    Current Medications: Current Meds  Medication Sig  . Artificial Tear Solution (GENTEAL TEARS) 0.1-0.2-0.3 % SOLN Place 1 drop 3 times/day as needed-between meals & bedtime into both eyes (dry eyes).  . Biotin 5000 MCG TABS Take 1 tablet daily by mouth.   . Calcium Carb-Cholecalciferol (CALCIUM + D3 PO) Take 1 tablet daily by mouth.  . doxycycline (MONODOX) 100 MG capsule Take 1 capsule (100 mg total) by mouth 2 (two) times daily for 10 days. Take with food and drink  . estradiol (ESTRACE) 0.1 MG/GM vaginal cream Place 1 Applicatorful at bedtime vaginally.  Marland Kitchen estradiol (ESTRACE) 1 MG tablet Take 0.5 mg daily by mouth.   . fluticasone (FLONASE) 50 MCG/ACT nasal spray SHAKE LIQUID AND USE 2 SPRAYS IN EACH NOSTRIL EVERY DAY  . hydrocortisone (ANUSOL-HC) 25 MG suppository Place 1 suppository (25 mg total) rectally 2 (two) times daily.  . montelukast (SINGULAIR) 10 MG tablet Take 10 mg at bedtime by mouth.   . Multiple Vitamin (MULTIVITAMIN) tablet Take 1 tablet by mouth daily.  . pantoprazole (PROTONIX) 40 MG tablet TAKE 1 TABLET BY MOUTH EVERY DAY  . Probiotic Product (PROBIOTIC ADVANCED PO) Take 1 capsule daily by mouth.   . progesterone (PROMETRIUM) 100 MG capsule Take 100 mg daily by mouth.  . traZODone (DESYREL) 50 MG tablet take 1/2 to 1 tablets  by mouth at bedtime if needed for sleep  . [DISCONTINUED] metoprolol tartrate (LOPRESSOR) 25 MG tablet Take 1 tablet (25 mg total) by mouth 2 (two) times daily.  . [DISCONTINUED] metoprolol tartrate (LOPRESSOR) 25 MG tablet Take 1.5 tablets (37.5 mg total) by mouth 2 (two) times daily.  . [DISCONTINUED] propranolol (INDERAL) 20 MG tablet Take 1 tablet (20 mg total) by mouth 3 (three) times daily as needed.     Allergies:   Prednisone and Ceftin [cefuroxime axetil]   Social History   Socioeconomic History  . Marital status: Widowed    Spouse name: Not on file  . Number of children: 3  . Years of education: Not on file  . Highest  education level: Not on file  Occupational History  . Not on file  Tobacco Use  . Smoking status: Never Smoker  . Smokeless tobacco: Never Used  Vaping Use  . Vaping Use: Never used  Substance and Sexual Activity  . Alcohol use: No    Alcohol/week: 0.0 standard drinks  . Drug use: No  . Sexual activity: Not on file  Other Topics Concern  . Not on file  Social History Narrative  . Not on file   Social Determinants of Health   Financial Resource Strain: Not on file  Food Insecurity: Not on file  Transportation Needs: Not on file  Physical Activity: Not on file  Stress: Not on file  Social Connections: Not on file     Family History: The patient's family history includes ALS in her father; Brain cancer in her mother; Diabetes in her brother; Heart disease in her son; Throat cancer in her brother. There is no history of Breast cancer.  ROS:   Please see the history of present illness.    All other systems reviewed and are negative.  EKGs/Labs/Other Studies Reviewed:    The following studies were reviewed today:  July 15, 2020 EKG Wide-complex tachycardia with a rapid initial deflection of the QRS.   July 20, 2020 EKG Sinus rhythm.  No evidence of preexcitation.      EKG:  The ekg ordered today demonstrates sinus rhythm frequent monomorphic PVCs.  PVCs have a sharply inferior axis.  Recent Labs: 07/15/2020: TSH 2.122 07/28/2020: ALT 16; B Natriuretic Peptide 32.2; BUN 22; Creatinine, Ser 0.70; Hemoglobin 14.4; Magnesium 2.1; Platelets 225; Potassium 4.4; Sodium 139  Recent Lipid Panel    Component Value Date/Time   CHOL 199 03/25/2020 0802   TRIG 95.0 03/25/2020 0802   HDL 51.50 03/25/2020 0802   CHOLHDL 4 03/25/2020 0802   VLDL 19.0 03/25/2020 0802   LDLCALC 129 (H) 03/25/2020 0802    Physical Exam:    VS:  BP 106/68 (BP Location: Left Arm, Patient Position: Sitting, Cuff Size: Normal)   Pulse 80   Ht 5\' 6"  (1.676 m)   Wt 138 lb (62.6 kg)   LMP  07/15/2012   SpO2 97%   BMI 22.27 kg/m     Wt Readings from Last 3 Encounters:  07/28/20 138 lb (62.6 kg)  07/28/20 138 lb (62.6 kg)  07/20/20 139 lb (63 kg)     GEN:  Well nourished, well developed in no acute distress HEENT: Normal NECK: No JVD; No carotid bruits LYMPHATICS: No lymphadenopathy CARDIAC: RRR, no murmurs, rubs, gallops RESPIRATORY:  Clear to auscultation without rales, wheezing or rhonchi  ABDOMEN: Soft, non-tender, non-distended MUSCULOSKELETAL:  No edema; No deformity  SKIN: Warm and dry NEUROLOGIC:  Alert and oriented x 3 PSYCHIATRIC:  Normal affect   ASSESSMENT:    1. SVT (supraventricular tachycardia) ([place])   2. Dilated cardiomyopathy ([place])   3. Nonischemic cardiomyopathy ([place])   4. PVC (premature ventricular contraction)    PLAN:    In order of problems listed above:  1. Tachycardia Wide-complex with a ventricular rate of approximately 220 bpm.  Favor this to be an SVT with aberrancy although I cannot completely exclude ventricular tachycardia.  Discussed management options for her tachycardia including pharmacologic suppression or EP study with possible ablation.  Given the highly symptomatic nature of the tachycardia and the increasing frequency of the episodes, she would like to proceed with EP study with possible ablation.  I discussed the procedure in detail with the patient and her husband who is with her today and they would like to proceed.  In the meantime she will increase her metoprolol to 37.5 mg twice daily.  I have asked her to hold her beta-blocker for 48 hours prior to the ablation procedure to maximize her chances of being able to induce the arrhythmia.  Therapeutic strategies for tachycardia including medicine and ablation were discussed in detail with the patient today. Risk, benefits, and alternatives to EP study and radiofrequency ablation were also discussed in detail today. These risks include but are not limited to stroke,  bleeding, vascular damage, tamponade, perforation, damage to the heart and other structures, AV block requiring pacemaker, worsening renal function, and death. The patient understands these risk and wishes to proceed.  We will therefore proceed with catheter ablation at the next available time.  2.  Frequent PVCs Appear to be originating from the outflow tract.  We will plan to quantify the PVCs using a ZIO monitor.  3.  Chest pain Prompted her most recent presentation to the emergency department.  High-sensitivity troponins were negative at that time which should exclude significant coronary disease.  Coronary CTA scheduled for tomorrow.   Total time of encounter: 65 minutes total time of encounter, including direct patient care, coordination of care and counseling regarding high complexity medical decision making re: SVT.   Medication Adjustments/Labs and Tests Ordered: Current medicines are reviewed at length with the patient today.  Concerns regarding medicines are outlined above.  Orders Placed This Encounter  Procedures  . LONG TERM MONITOR (3-14 DAYS)  . EKG 12-Lead   Meds ordered this encounter  Medications  . DISCONTD: metoprolol tartrate (LOPRESSOR) 25 MG tablet    Sig: Take 1.5 tablets (37.5 mg total) by mouth 2 (two) times daily.    Dispense:  135 tablet    Refill:  3  . propranolol (INDERAL) 20 MG tablet    Sig: Take 1 tablet (20 mg total) by mouth 3 (three) times daily as needed.    Dispense:  90 tablet    Refill:  0  . metoprolol tartrate (LOPRESSOR) 25 MG tablet    Sig: Take 1.5 tablets (37.5 mg total) by mouth 2 (two) times daily.    Dispense:  135 tablet    Refill:  3     Signed, Lars Mage, MD, [facility], [facility]  07/28/2020 9:47 PM    Electrophysiology [facility]

## 2020-07-28 NOTE — ED Provider Notes (Signed)
[facility] Emergency Department Provider Note  ____________________________________________   Event Date/Time   First MD Initiated Contact with Patient 07/28/20 [phone]     (approximate)  I have reviewed the triage vital signs and the nursing notes.   HISTORY  Chief Complaint Chest Pain    HPI Theresa Buck is a 65 y.o. female with history of migraines, recently diagnosed SVT, here with chest pain and palpitations.  The patient states that since her ED visit a week ago, she has seen Dr. Rockey Situ and is currently being set up to see electrophysiology today.  She states that she has had increasingly frequent palpitations since her visit.  However, over the last day or so, she has developed intermittent dull chest pressure as well.  This is new.  It is not necessarily correlated with her palpitations.  She has felt anxious and somewhat short of breath with these episodes.  Denies any fevers or chills.  She was started on metoprolol and has been taking this without any significant noticeable relief.  No other complaints.  No caffeine or stimulant use.  No known history of coronary disease.        Past Medical History:  Diagnosis Date  . Allergy    recurring sinus problems  . Anxiety   . Arthritis   . Deviated septum   . Deviated septum   . GERD (gastroesophageal reflux disease)   . H/O sinusitis   . Headache    H/O MIGRAINES  . Hx of degenerative disc disease   . SVT (supraventricular tachycardia) [facility])     Patient Active Problem List   Diagnosis Date Noted  . COVID-19 virus infection 05/02/2020  . Hearing loss 06/30/2018  . Hemorrhoids 12/10/2016  . Fullness of breast 08/11/2015  . Epigastric pain 02/14/2015  . Pain of left foot 08/16/2014  . Health care maintenance 08/16/2014  . Other specified symptoms and signs involving the digestive system and abdomen 07/16/2014  . Mastodynia 04/06/2013  . Acute vaginitis 02/09/2013  . Supraventricular  tachycardia ([address]) 08/27/2012  . Stress 08/27/2012  . Other nonmedicinal substance allergy status 04/07/2012  . Gastro-esophageal reflux disease without esophagitis 04/07/2012    Past Surgical History:  Procedure Laterality Date  . BACK SURGERY  09/26/2013   LUMBAR  . COLONOSCOPY  08/03/2015   Dr Vira Agar  . HEMORRHOID SURGERY N/A 03/13/2017   Procedure: HEMORRHOIDECTOMY;  Surgeon: Christene Lye, MD;  Location: [facility] ORS;  Service: General;  Laterality: N/A;  . UPPER GI ENDOSCOPY  08/03/2015   Dr Vira Agar    Prior to Admission medications   Medication Sig Start Date End Date Taking? Authorizing Provider  Artificial Tear Solution (GENTEAL TEARS) 0.1-0.2-0.3 % SOLN Place 1 drop 3 times/day as needed-between meals & bedtime into both eyes (dry eyes).    [provider]  Biotin 5000 MCG TABS Take 1 tablet daily by mouth.     [provider]  Calcium Carb-Cholecalciferol (CALCIUM + D3 PO) Take 1 tablet daily by mouth.    [provider]  doxycycline (MONODOX) 100 MG capsule Take 1 capsule (100 mg total) by mouth 2 (two) times daily for 10 days. Take with food and drink 07/27/20 08/06/20  Brendolyn Patty, MD  estradiol (ESTRACE) 0.1 MG/GM vaginal cream Place 1 Applicatorful at bedtime vaginally.    [provider]  estradiol (ESTRACE) 1 MG tablet Take 0.5 mg daily by mouth.     [provider]  fluticasone (FLONASE) 50 MCG/ACT nasal spray  SHAKE LIQUID AND USE 2 SPRAYS IN EACH NOSTRIL EVERY DAY 10/17/19   Einar Pheasant, MD  hydrocortisone (ANUSOL-HC) 25 MG suppository Place 1 suppository (25 mg total) rectally 2 (two) times daily. 10/31/19   Einar Pheasant, MD  metoprolol tartrate (LOPRESSOR) 25 MG tablet Take 1 tablet (25 mg total) by mouth 2 (two) times daily. 07/15/20 08/14/20  Vanessa Arion, MD  montelukast (SINGULAIR) 10 MG tablet Take 10 mg at bedtime by mouth.  09/29/16   [provider]  Multiple Vitamin (MULTIVITAMIN) tablet Take 1  tablet by mouth daily.    [provider]  pantoprazole (PROTONIX) 40 MG tablet TAKE 1 TABLET BY MOUTH EVERY DAY 01/29/20   Einar Pheasant, MD  Probiotic Product (PROBIOTIC ADVANCED PO) Take 1 capsule daily by mouth.     [provider]  progesterone (PROMETRIUM) 100 MG capsule Take 100 mg daily by mouth. 01/22/17   [provider]  progesterone (PROMETRIUM) 100 MG capsule Take 200 mg by mouth at bedtime. 05/18/20   [provider]  propranolol (INDERAL) 20 MG tablet Take 1 tablet (20 mg total) by mouth 3 (three) times daily as needed. 10/31/19   Einar Pheasant, MD  traZODone (DESYREL) 50 MG tablet take 1/2 to 1 tablets by mouth at bedtime if needed for sleep 12/27/17   Einar Pheasant, MD    Allergies Prednisone and Ceftin [cefuroxime axetil]  Family History  Problem Relation Age of Onset  . Brain cancer Mother   . ALS Father   . Diabetes Brother        multiple  . Heart disease Son        myocardial infarction, s/p CABG  . Throat cancer Brother   . Breast cancer Neg Hx     Social History Social History   Tobacco Use  . Smoking status: Never Smoker  . Smokeless tobacco: Never Used  Vaping Use  . Vaping Use: Never used  Substance Use Topics  . Alcohol use: No    Alcohol/week: 0.0 standard drinks  . Drug use: No    Review of Systems  Review of Systems  Constitutional: Positive for fatigue. Negative for fever.  HENT: Negative for congestion and sore throat.   Eyes: Negative for visual disturbance.  Respiratory: Positive for chest tightness. Negative for cough and shortness of breath.   Cardiovascular: Positive for chest pain and palpitations.  Gastrointestinal: Negative for abdominal pain, diarrhea, nausea and vomiting.  Genitourinary: Negative for flank pain.  Musculoskeletal: Negative for back pain and neck pain.  Skin: Negative for rash and wound.  Neurological: Positive for weakness.  All other systems reviewed and are negative.     ____________________________________________  PHYSICAL EXAM:      VITAL SIGNS: ED Triage Vitals  Enc Vitals Group     BP 07/28/20 0903 (!) 121/104     Pulse Rate 07/28/20 0903 77     Resp 07/28/20 0903 18     Temp 07/28/20 0903 98.1 F (36.7 C)     Temp Source 07/28/20 0903 Oral     SpO2 07/28/20 0903 97 %     Weight 07/28/20 0904 138 lb (62.6 kg)     Height 07/28/20 0904 5\' 6"  (1.676 m)     Head Circumference --      Peak Flow --      Pain Score 07/28/20 0904 0     Pain Loc --      Pain Edu? --      Excl. in [place]? --  Physical Exam Vitals and nursing note reviewed.  Constitutional:      General: She is not in acute distress.    Appearance: She is well-developed.  HENT:     Head: Normocephalic and atraumatic.  Eyes:     Conjunctiva/sclera: Conjunctivae normal.  Cardiovascular:     Rate and Rhythm: Normal rate and regular rhythm.     Heart sounds: Normal heart sounds. No murmur heard. No friction rub.  Pulmonary:     Effort: Pulmonary effort is normal. No respiratory distress.     Breath sounds: Normal breath sounds. No wheezing or rales.  Abdominal:     General: There is no distension.     Palpations: Abdomen is soft.     Tenderness: There is no abdominal tenderness.  Musculoskeletal:     Cervical back: Neck supple.  Skin:    General: Skin is warm.     Capillary Refill: Capillary refill takes less than 2 seconds.  Neurological:     Mental Status: She is alert and oriented to person, place, and time.     Motor: No abnormal muscle tone.       ____________________________________________   LABS (all labs ordered are listed, but only abnormal results are displayed)  Labs Reviewed  CBC WITH DIFFERENTIAL/PLATELET  COMPREHENSIVE METABOLIC PANEL  BRAIN NATRIURETIC PEPTIDE  MAGNESIUM  TROPONIN I (HIGH SENSITIVITY)  TROPONIN I (HIGH SENSITIVITY)    ____________________________________________  EKG: Normal sinus rhythm, ventricular rate 72.  PR 146,  QRS 90, QTc 418.  No acute ST elevations or depression but no acute evidence of acute ischemia or infarct. ________________________________________  RADIOLOGY All imaging, including plain films, CT scans, and ultrasounds, independently reviewed by me, and interpretations confirmed via formal radiology reads.  ED MD interpretation:   Chest x-ray: Negative  Official radiology report(s): DG Chest 2 View  Result Date: 07/28/2020 CLINICAL DATA:  Nonradiating right-sided chest pain EXAM: CHEST - 2 VIEW COMPARISON:  Prior chest x-ray 07/15/2020 FINDINGS: The lungs are clear and negative for focal airspace consolidation, pulmonary edema or suspicious pulmonary nodule. No pleural effusion or pneumothorax. Cardiac and mediastinal contours are within normal limits. No acute fracture or lytic or blastic osseous lesions. The visualized upper abdominal bowel gas pattern is unremarkable. IMPRESSION: Negative chest x-ray. Electronically Signed   By: Jacqulynn Cadet M.D.   On: 07/28/2020 09:40    ____________________________________________  PROCEDURES   Procedure(s) performed (including Critical Care):  Procedures  ____________________________________________  INITIAL IMPRESSION / MDM / [place] / ED COURSE  As part of my medical decision making, I reviewed the following data within the [place] notes reviewed and incorporated, Old chart reviewed, Notes from prior ED visits, and [place] Controlled Substance Database       *STUART GUILLEN was evaluated in Emergency Department on 07/28/2020 for the symptoms described in the history of present illness. She was evaluated in the context of the global COVID-19 pandemic, which necessitated consideration that the patient might be at risk for infection with the SARS-CoV-2 virus that causes COVID-19. Institutional protocols and algorithms that pertain to the evaluation of patients at risk for COVID-19 are in a state of rapid  change based on information released by regulatory bodies including the CDC and federal and state organizations. These policies and algorithms were followed during the patient's care in the ED.  Some ED evaluations and interventions may be delayed as a result of limited staffing during the pandemic.*     Medical Decision  Making: Pleasant 65 year old female here with intermittent palpitations and chest pressure.  EKG is nonischemic.  Chest x-ray clear.  Lab work is overall unremarkable with negative troponins x2.  BNP normal.  CBC and CMP unremarkable with normal electrolytes.  Patient maintained on telemetry for several hours here, without significant ectopy or arrhythmia.  She has occasional PVCs but no evidence of SVT or sustained or nonsustained V. tach.  Discussed case with Dr. Garen Lah and Dr. Quentin Ore (EP). Will d/c with outpt follow-up with Dr. Quentin Ore today. No apparent indication for admission, and pt can be appropriately seen, evaluated as outpt. Pt in agreement with plan. ____________________________________________  FINAL CLINICAL IMPRESSION(S) / ED DIAGNOSES  Final diagnoses:  Atypical chest pain  Palpitations     MEDICATIONS GIVEN DURING THIS VISIT:  Medications - No data to display   ED Discharge Orders    None       Note:  This document was prepared using Dragon voice recognition software and may include unintentional dictation errors.   Duffy Bruce, MD 07/28/20 1351

## 2020-07-28 NOTE — ED Triage Notes (Signed)
Pt to ER via POV with R sided non-radiating chest pain that started on Saturday and got worse today. Pt also reports shortness of breath this morning. Hx of SVT. Felt like her heart was racing this morning and was light headed.

## 2020-07-28 NOTE — Telephone Encounter (Signed)
Per family member patient going to the ED now to evaluate chest pain .

## 2020-07-28 NOTE — Telephone Encounter (Signed)
Unable to obtain additional information and patient not available to discuss .

## 2020-07-28 NOTE — ED Notes (Signed)
Pt roomed in 27, placed on monitor, states she is having "twinges" of sharp left sided cp that come and go. NAD. Bed locked and low, call light in reach.

## 2020-07-28 NOTE — Telephone Encounter (Signed)
Attempted to call patient regarding upcoming cardiac CT appointment. °Left message on voicemail with name and callback number ° °Mysha Peeler RN Navigator Cardiac Imaging °[facility] °[phone] Office °[phone] Cell ° °

## 2020-07-29 ENCOUNTER — Other Ambulatory Visit: Payer: Self-pay

## 2020-07-29 ENCOUNTER — Ambulatory Visit
Admission: RE | Admit: 2020-07-29 | Discharge: 2020-07-29 | Disposition: A | Discharge status: Home / Self Care | Payer: BLUE CROSS/BLUE SHIELD | Source: Ambulatory Visit | Attending: Cardiovascular Disease | Admitting: Cardiovascular Disease

## 2020-07-29 DIAGNOSIS — I428 Other cardiomyopathies: Secondary | ICD-10-CM | POA: Diagnosis not present

## 2020-07-29 DIAGNOSIS — R0989 Other specified symptoms and signs involving the circulatory and respiratory systems: Secondary | ICD-10-CM | POA: Insufficient documentation

## 2020-07-29 DIAGNOSIS — I471 Supraventricular tachycardia: Secondary | ICD-10-CM | POA: Insufficient documentation

## 2020-07-29 MED ORDER — METOPROLOL TARTRATE 5 MG/5ML IV SOLN: 10.0000 mg | Freq: Once | INTRAVENOUS | Status: DC

## 2020-07-29 MED ORDER — IOHEXOL 350 MG/ML SOLN
75.0000 mL | Freq: Once | INTRAVENOUS | Status: AC | PRN
  Administered 2020-07-29: 75 mL via INTRAVENOUS

## 2020-07-29 MED ORDER — NITROGLYCERIN 0.4 MG SL SUBL
0.8000 mg | SUBLINGUAL_TABLET | Freq: Once | SUBLINGUAL | Status: AC
  Administered 2020-07-29: 0.8 mg via SUBLINGUAL

## 2020-07-29 NOTE — Progress Notes (Signed)
Patient tolerated procedure well. Ambulate w/o difficulty. Sitting in chair drinking water provided. Encouraged to drink extra water today and reasoning explained. Verbalized understanding. All questions answered. ABC intact. No further needs. Discharge from procedure area w/o issues.  

## 2020-08-02 ENCOUNTER — Telehealth: Payer: Self-pay

## 2020-08-02 DIAGNOSIS — I471 Supraventricular tachycardia: Secondary | ICD-10-CM

## 2020-08-02 NOTE — Telephone Encounter (Signed)
Sent mychart message regarding SVT ablation

## 2020-08-04 ENCOUNTER — Telehealth: Payer: Self-pay

## 2020-08-04 NOTE — Telephone Encounter (Signed)
Able to reach pt regarding his recent cardiac CTA, Dr. Rockey Situ had a chance to review her results and advised   " Cardiac CTA  Very low calcium score  No significant stenosis noted  Great news"  Theresa Buck very pleased with her results, as she worried given her last few weeks with her heart history. All questions and concerns were address, reports will reach out to Dr. Quentin Ore of [facility] if she persist to have heart palpitation or fast HR.  Nothing further at this time.

## 2020-08-09 DIAGNOSIS — I493 Ventricular premature depolarization: Secondary | ICD-10-CM | POA: Diagnosis not present

## 2020-08-09 DIAGNOSIS — I471 Supraventricular tachycardia: Secondary | ICD-10-CM | POA: Diagnosis not present

## 2020-08-11 NOTE — Telephone Encounter (Signed)
Spoke with Pt.  Pt scheduled for SVT ablation on Sep 16, 2020  Labs /covid test scheduled for Sep 15, 2020 d/t Pt work schedule and unable to quarantine for an additional day prior to procedure.  Discussed instructions.  Also mailing instruction letter  Work up complete.

## 2020-08-11 NOTE — Telephone Encounter (Signed)
Outreach made to Pt.  Left detailed message per DPR.  Advised May 26 available with one spot left open for procedure.  Requested call back OR MyChart message as soon as possible to hold spot.  Await feedback.

## 2020-08-13 DIAGNOSIS — M9905 Segmental and somatic dysfunction of pelvic region: Secondary | ICD-10-CM | POA: Diagnosis not present

## 2020-08-13 DIAGNOSIS — M41125 Adolescent idiopathic scoliosis, thoracolumbar region: Secondary | ICD-10-CM | POA: Diagnosis not present

## 2020-08-13 DIAGNOSIS — M9903 Segmental and somatic dysfunction of lumbar region: Secondary | ICD-10-CM | POA: Diagnosis not present

## 2020-08-13 DIAGNOSIS — M5417 Radiculopathy, lumbosacral region: Secondary | ICD-10-CM | POA: Diagnosis not present

## 2020-08-25 ENCOUNTER — Other Ambulatory Visit: Payer: Self-pay

## 2020-08-25 ENCOUNTER — Telehealth: Payer: Self-pay | Admitting: Internal Medicine

## 2020-08-25 MED ORDER — BENZONATATE 100 MG PO CAPS: 100.0000 mg | ORAL_CAPSULE | Freq: Three times a day (TID) | ORAL | 0 refills | Status: DC | PRN

## 2020-08-25 NOTE — Telephone Encounter (Signed)
Patient stated she has had a cough since last Tuesday. Sore throat once last week, coughing up mucus, and nasal discharge. NO chest px, SOB,wheezing, fever, nausea, vomiting, watery eyes, and blurry vision. Partner has the same sx but he was negative for covid, so she did not get a test. She believes this is allergies. She has taken mucinex DM  for sx. Patient doesn't believe she needs to go to UC, she has appointment with PCP [id number]. If sx worsen she will go to UC if needed.

## 2020-08-25 NOTE — Telephone Encounter (Signed)
Confirmed no other acute symptoms. Sent in [place] for her to use for one week. She declined appt on Monday. She has appt on 5/13. She is going to call with update on Friday to determine if she needs to be seen sooner

## 2020-08-25 NOTE — Telephone Encounter (Signed)
Please triage

## 2020-08-25 NOTE — Telephone Encounter (Signed)
PT called in stating that she is having non stop coughing and is not sure if she is having allergies. She states she does not have a fever and wants to know what to do.

## 2020-08-25 NOTE — Telephone Encounter (Signed)
If just cough, can send in prescription for tessalon perles 100mg  tid prn cough.  I can see her on Monday - work in virtual - 11:30 spot.  If something more acute - will need to be seen earlier.

## 2020-09-03 ENCOUNTER — Other Ambulatory Visit: Payer: Self-pay

## 2020-09-03 ENCOUNTER — Ambulatory Visit: Payer: BLUE CROSS/BLUE SHIELD | Admitting: Internal Medicine

## 2020-09-03 VITALS — BP 118/70 | HR 63 | Temp 96.9°F | Resp 16 | Ht 66.0 in | Wt 139.0 lb

## 2020-09-03 DIAGNOSIS — K219 Gastro-esophageal reflux disease without esophagitis: Secondary | ICD-10-CM | POA: Diagnosis not present

## 2020-09-03 DIAGNOSIS — F439 Reaction to severe stress, unspecified: Secondary | ICD-10-CM | POA: Diagnosis not present

## 2020-09-03 DIAGNOSIS — R7989 Other specified abnormal findings of blood chemistry: Secondary | ICD-10-CM | POA: Diagnosis not present

## 2020-09-03 DIAGNOSIS — I471 Supraventricular tachycardia: Secondary | ICD-10-CM | POA: Diagnosis not present

## 2020-09-03 NOTE — Progress Notes (Signed)
Patient ID: Theresa Buck, female   DOB: [birth year], 65 y.o.   MRN: [id number]   Subjective:    Patient ID: Theresa Buck, female    DOB: 01-Nov-1955, 65 y.o.   MRN: [id number]  HPI This visit occurred during the SARS-CoV-2 public health emergency.  Safety protocols were in place, including screening questions prior to the visit, additional usage of staff PPE, and extensive cleaning of exam room while observing appropriate contact time as indicated for disinfecting solutions.  Patient here for a scheduled follow up.  Has a history of paroxysmal tachycardia.  Treated with propranolol.  Presented to ER 07/15/20 with palpitations.  ER - found to be in wide complex tachycardia with ventricular rate 220 - felt to be SVT with aberrancy.  Saw Dr Quentin Ore - cardiology.  Planning for ablation.  Metoprolol increased to 37.5mg  bid.  CT - calcium score 12.6.  States she is doing better.  Heart rate better.  No chest pain or increased sob reported.  Eating.  No abdominal pain.  Bowels moving.   Past Medical History:  Diagnosis Date  . Allergy    recurring sinus problems  . Anxiety   . Arthritis   . Deviated septum   . Deviated septum   . GERD (gastroesophageal reflux disease)   . H/O sinusitis   . Headache    H/O MIGRAINES  . Hx of degenerative disc disease   . SVT (supraventricular tachycardia) (HCC)    Past Surgical History:  Procedure Laterality Date  . BACK SURGERY  09/26/2013   LUMBAR  . COLONOSCOPY  08/03/2015   Dr Vira Agar  . HEMORRHOID SURGERY N/A 03/13/2017   Procedure: HEMORRHOIDECTOMY;  Surgeon: Christene Lye, MD;  Location: [facility] ORS;  Service: General;  Laterality: N/A;  . UPPER GI ENDOSCOPY  08/03/2015   Dr Vira Agar   Family History  Problem Relation Age of Onset  . Brain cancer Mother   . ALS Father   . Diabetes Brother        multiple  . Heart disease Son        myocardial infarction, s/p CABG  . Throat cancer Brother   . Breast cancer Neg Hx    Social History    Socioeconomic History  . Marital status: Widowed    Spouse name: Not on file  . Number of children: 3  . Years of education: Not on file  . Highest education level: Not on file  Occupational History  . Not on file  Tobacco Use  . Smoking status: Never Smoker  . Smokeless tobacco: Never Used  Vaping Use  . Vaping Use: Never used  Substance and Sexual Activity  . Alcohol use: No    Alcohol/week: 0.0 standard drinks  . Drug use: No  . Sexual activity: Not on file  Other Topics Concern  . Not on file  Social History Narrative  . Not on file   Social Determinants of Health   Financial Resource Strain: Not on file  Food Insecurity: Not on file  Transportation Needs: Not on file  Physical Activity: Not on file  Stress: Not on file  Social Connections: Not on file    Outpatient Encounter Medications as of 09/03/2020  Medication Sig  . Biotin 5000 MCG TABS Take 5,000 mcg by mouth daily.  . Calcium Carb-Cholecalciferol (CALCIUM + D3 PO) Take 1 tablet daily by mouth.  . estradiol (ESTRACE) 0.1 MG/GM vaginal cream Place 1 Applicatorful vaginally daily.  Marland Kitchen estradiol (ESTRACE) 1 MG tablet  Take 0.5 mg daily by mouth.   . fluticasone (FLONASE) 50 MCG/ACT nasal spray SHAKE LIQUID AND USE 2 SPRAYS IN EACH NOSTRIL EVERY DAY (Patient taking differently: Place 1 spray into both nostrils daily as needed for allergies.)  . hydrocortisone (ANUSOL-HC) 25 MG suppository Place 1 suppository (25 mg total) rectally 2 (two) times daily. (Patient taking differently: Place 25 mg rectally 2 (two) times daily as needed for hemorrhoids.)  . metoprolol tartrate (LOPRESSOR) 25 MG tablet Take 1.5 tablets (37.5 mg total) by mouth 2 (two) times daily.  . montelukast (SINGULAIR) 10 MG tablet Take 10 mg at bedtime by mouth.   . Multiple Vitamin (MULTIVITAMIN) tablet Take 1 tablet by mouth daily.  . pantoprazole (PROTONIX) 40 MG tablet TAKE 1 TABLET BY MOUTH EVERY DAY (Patient taking differently: Take 40 mg by  mouth daily.)  . Probiotic Product (PROBIOTIC ADVANCED PO) Take 1 capsule daily by mouth.   . progesterone (PROMETRIUM) 100 MG capsule Take 100 mg daily by mouth.  . propranolol (INDERAL) 20 MG tablet Take 1 tablet (20 mg total) by mouth 3 (three) times daily as needed. (Patient taking differently: Take 20 mg by mouth 3 (three) times daily as needed (SVT).)  . traZODone (DESYREL) 50 MG tablet take 1/2 to 1 tablets by mouth at bedtime if needed for sleep (Patient taking differently: Take 25-50 mg by mouth at bedtime as needed for sleep. take 1/2 to 1 tablets by mouth at bedtime if needed for sleep)  . [DISCONTINUED] Artificial Tear Solution (GENTEAL TEARS) 0.1-0.2-0.3 % SOLN Place 1 drop 3 times/day as needed-between meals & bedtime into both eyes (dry eyes).  . [DISCONTINUED] benzonatate (TESSALON) 100 MG capsule Take 1 capsule (100 mg total) by mouth 3 (three) times daily as needed for cough. (Patient not taking: Reported on 09/07/2020)   No facility-administered encounter medications on file as of 09/03/2020.    Review of Systems  Constitutional: Negative for appetite change and unexpected weight change.  HENT: Negative for congestion and sinus pressure.   Respiratory: Negative for cough, chest tightness and shortness of breath.   Cardiovascular: Negative for chest pain, palpitations and leg swelling.  Gastrointestinal: Negative for abdominal pain, diarrhea, nausea and vomiting.  Genitourinary: Negative for difficulty urinating and dysuria.  Musculoskeletal: Negative for joint swelling and myalgias.  Skin: Negative for color change and rash.  Neurological: Negative for dizziness, light-headedness and headaches.  Psychiatric/Behavioral: Negative for agitation and dysphoric mood.       Objective:    Physical Exam Vitals reviewed.  Constitutional:      General: She is not in acute distress.    Appearance: Normal appearance.  HENT:     Head: Normocephalic and atraumatic.     Right Ear:  External ear normal.     Left Ear: External ear normal.  Eyes:     General: No scleral icterus.       Right eye: No discharge.        Left eye: No discharge.     Conjunctiva/sclera: Conjunctivae normal.  Neck:     Thyroid: No thyromegaly.  Cardiovascular:     Rate and Rhythm: Normal rate and regular rhythm.  Pulmonary:     Effort: No respiratory distress.     Breath sounds: Normal breath sounds. No wheezing.  Abdominal:     General: Bowel sounds are normal.     Palpations: Abdomen is soft.     Tenderness: There is no abdominal tenderness.  Musculoskeletal:  General: No swelling or tenderness.     Cervical back: Neck supple. No tenderness.  Lymphadenopathy:     Cervical: No cervical adenopathy.  Skin:    Findings: No erythema or rash.  Neurological:     Mental Status: She is alert.  Psychiatric:        Mood and Affect: Mood normal.        Behavior: Behavior normal.     BP 118/70   Pulse 63   Temp (!) 96.9 F (36.1 C) (Temporal)   Resp 16   Ht 5\' 6"  (1.676 m)   Wt 139 lb (63 kg)   LMP 07/15/2012   SpO2 98%   BMI 22.44 kg/m  Wt Readings from Last 3 Encounters:  09/03/20 139 lb (63 kg)  07/28/20 138 lb (62.6 kg)  07/28/20 138 lb (62.6 kg)     Lab Results  Component Value Date   WBC 7.4 07/28/2020   HGB 14.4 07/28/2020   HCT 43.5 07/28/2020   PLT 225 07/28/2020   GLUCOSE 95 07/28/2020   CHOL 199 03/25/2020   TRIG 95.0 03/25/2020   HDL 51.50 03/25/2020   LDLCALC 129 (H) 03/25/2020   ALT 16 07/28/2020   AST 17 07/28/2020   NA 139 07/28/2020   K 4.4 07/28/2020   CL 106 07/28/2020   CREATININE 0.70 07/28/2020   BUN 22 07/28/2020   CO2 27 07/28/2020   TSH 1.18 09/03/2020    CT CORONARY MORPH W/CTA COR W/SCORE W/CA W/CM &/OR WO/CM  Addendum Date: 07/30/2020   ADDENDUM REPORT: 07/30/2020 08:16 EXAM: OVER-READ INTERPRETATION  CT CHEST The following report is an over-read performed by radiologist Dr. Norlene Duel [facility] on  07/30/2020. This over-read does not include interpretation of cardiac or coronary anatomy or pathology. The coronary CTA interpretation by the cardiologist is attached. COMPARISON:  None. FINDINGS: Mediastinum/nodes: No mass or adenopathy. Lungs/pleura: No pleural effusion, airspace consolidation or atelectasis. Upper abdomen: No acute abnormality. Musculoskeletal: No acute or suspicious findings. IMPRESSION: 1. Negative over-read. No significant noncardiac supplemental findings identified. Electronically Signed   By: Kerby Moors M.D.   On: 07/30/2020 08:16   Result Date: 07/30/2020 CLINICAL DATA:  Chest pain, SVT EXAM: Cardiac/Coronary  CTA TECHNIQUE: The patient was scanned on a Siemens Somatoform go.Top scanner. FINDINGS: A retrospective scan was triggered in the descending thoracic aorta. Axial non-contrast 3 mm slices were carried out through the heart. The data set was analyzed on a dedicated work station and scored using the [place]. Gantry rotation speed was 330 msecs and collimation was .6 mm. 25 mg of metoprolol and 0.8 mg of sl NTG was given. The 3D data set was reconstructed in 5% intervals of the 60-95 % of the R-R cycle. Diastolic phases were analyzed on a dedicated work station using MPR, MIP and VRT modes. The patient received 75 cc of contrast. Aorta:  Normal size.  No calcifications.  No dissection. Aortic Valve:  Trileaflet.  No calcifications. Coronary Arteries:  Normal coronary origin.  Right dominance. RCA is a dominant artery that gives rise to PDA and PLA. There is no plaque. Left main is a large artery that gives rise to LAD and LCX arteries. LAD has no evidence for plaque. LCX is a non-dominant artery that gives rise to one OM1 branch. There is no plaque. Other findings: Normal pulmonary vein drainage into the left atrium. Normal left atrial appendage without a thrombus. Normal size of the pulmonary artery. IMPRESSION: 1. Coronary calcium score of  12.6. This was 66th percentile for  age and sex matched control. 2. Normal coronary origin with right dominance. 3. No angiographic evidence of CAD. 4. No evidence of CAD. Consider non-atherosclerotic causes of chest pain. Electronically Signed: By: Kate Sable M.D. On: 07/29/2020 17:13       Assessment & Plan:   Problem List Items Addressed This Visit    Gastro-esophageal reflux disease without esophagitis    No upper symptoms reported. On protonix.       Stress    Overall appears to be handling things relatively well.  Follow.       Supraventricular tachycardia (HCC)    On metoprolol.  Dose increased recently.  Seeing Dr Quentin Ore and Dr Rockey Situ.  Planning for ablation.  Doing better.  Follow.        Other Visit Diagnoses    Elevated serum free T4 level    -  Primary   Relevant Orders   TSH (Completed)   T4, free (Completed)       Einar Pheasant, MD

## 2020-09-04 LAB — T4, FREE: Free T4: 1.1 ng/dL (ref 0.8–1.8)

## 2020-09-04 LAB — TSH: TSH: 1.18 mIU/L (ref 0.40–4.50)

## 2020-09-09 ENCOUNTER — Encounter: Payer: Self-pay | Admitting: Internal Medicine

## 2020-09-10 ENCOUNTER — Other Ambulatory Visit: Payer: Self-pay

## 2020-09-10 ENCOUNTER — Telehealth: Payer: Self-pay | Admitting: Cardiology

## 2020-09-10 DIAGNOSIS — M9903 Segmental and somatic dysfunction of lumbar region: Secondary | ICD-10-CM | POA: Diagnosis not present

## 2020-09-10 DIAGNOSIS — M41125 Adolescent idiopathic scoliosis, thoracolumbar region: Secondary | ICD-10-CM | POA: Diagnosis not present

## 2020-09-10 DIAGNOSIS — M9905 Segmental and somatic dysfunction of pelvic region: Secondary | ICD-10-CM | POA: Diagnosis not present

## 2020-09-10 DIAGNOSIS — M5417 Radiculopathy, lumbosacral region: Secondary | ICD-10-CM | POA: Diagnosis not present

## 2020-09-10 NOTE — Telephone Encounter (Signed)
Patient calling in to discuss upcoming ablation. Please advise

## 2020-09-12 ENCOUNTER — Encounter: Payer: Self-pay | Admitting: Internal Medicine

## 2020-09-12 MED ORDER — TRAZODONE HCL 50 MG PO TABS: 25.0000 mg | ORAL_TABLET | Freq: Every evening | ORAL | 1 refills | Status: DC | PRN

## 2020-09-12 NOTE — Assessment & Plan Note (Signed)
On metoprolol.  Dose increased recently.  Seeing Dr Quentin Ore and Dr Rockey Situ.  Planning for ablation.  Doing better.  Follow.

## 2020-09-12 NOTE — Assessment & Plan Note (Signed)
Overall appears to be handling things relatively well.  Follow.   

## 2020-09-12 NOTE — Assessment & Plan Note (Signed)
No upper symptoms reported.  On protonix.   

## 2020-09-12 NOTE — Telephone Encounter (Signed)
rx ok'd for trazodone.

## 2020-09-13 NOTE — Telephone Encounter (Signed)
Returned call to Pt.  Discussed ablation-all questions answered.  Reassured Pt.

## 2020-09-15 ENCOUNTER — Other Ambulatory Visit: Payer: BLUE CROSS/BLUE SHIELD

## 2020-09-15 ENCOUNTER — Other Ambulatory Visit
Admission: RE | Admit: 2020-09-15 | Discharge: 2020-09-15 | Disposition: A | Discharge status: Home / Self Care | Payer: BLUE CROSS/BLUE SHIELD | Source: Ambulatory Visit | Attending: Cardiology | Admitting: Cardiology

## 2020-09-15 DIAGNOSIS — I471 Supraventricular tachycardia: Secondary | ICD-10-CM | POA: Insufficient documentation

## 2020-09-15 LAB — CBC WITH DIFFERENTIAL/PLATELET
Abs Immature Granulocytes: 0.02 10*3/uL (ref 0.00–0.07)
Basophils Absolute: 0 10*3/uL (ref 0.0–0.1)
Basophils Relative: 1 %
Eosinophils Absolute: 0.2 10*3/uL (ref 0.0–0.5)
Eosinophils Relative: 3 %
HCT: 42.8 % (ref 36.0–46.0)
Hemoglobin: 14 g/dL (ref 12.0–15.0)
Immature Granulocytes: 0 %
Lymphocytes Relative: 27 %
Lymphs Abs: 1.7 10*3/uL (ref 0.7–4.0)
MCH: 31.6 pg (ref 26.0–34.0)
MCHC: 32.7 g/dL (ref 30.0–36.0)
MCV: 96.6 fL (ref 80.0–100.0)
Monocytes Absolute: 0.5 10*3/uL (ref 0.1–1.0)
Monocytes Relative: 8 %
Neutro Abs: 3.9 10*3/uL (ref 1.7–7.7)
Neutrophils Relative %: 61 %
Platelets: 219 10*3/uL (ref 150–400)
RBC: 4.43 MIL/uL (ref 3.87–5.11)
RDW: 12.1 % (ref 11.5–15.5)
WBC: 6.4 10*3/uL (ref 4.0–10.5)
nRBC: 0 % (ref 0.0–0.2)

## 2020-09-15 LAB — BASIC METABOLIC PANEL
Anion gap: 9 (ref 5–15)
BUN: 19 mg/dL (ref 8–23)
CO2: 26 mmol/L (ref 22–32)
Calcium: 9 mg/dL (ref 8.9–10.3)
Chloride: 103 mmol/L (ref 98–111)
Creatinine, Ser: 0.8 mg/dL (ref 0.44–1.00)
GFR, Estimated: >60 mL/min (ref >60)
Glucose, Bld: 89 mg/dL (ref 70–99)
Potassium: 3.9 mmol/L (ref 3.5–5.1)
Sodium: 138 mmol/L (ref 135–145)

## 2020-09-15 NOTE — Pre-Procedure Instructions (Signed)
Instructed patient on the following items: °Arrival time 0530 °Nothing to eat or drink after midnight °No meds AM of procedure °Responsible person to drive you home and stay with you for 24 hrs ° ° °   °

## 2020-09-15 NOTE — Anesthesia Preprocedure Evaluation (Addendum)
Anesthesia Evaluation  Patient identified by MRN, date of birth, ID band Patient awake    Reviewed: Allergy & Precautions, NPO status , Patient's Chart, lab work & pertinent test results, reviewed documented beta blocker date and time   History of Anesthesia Complications Negative for: history of anesthetic complications  Airway Mallampati: II  TM Distance: >3 FB Neck ROM: Full    Dental no notable dental hx.    Pulmonary neg pulmonary ROS,    Pulmonary exam normal        Cardiovascular Pt. on home beta blockers Normal cardiovascular exam+ dysrhythmias Supra Ventricular Tachycardia   TTE 07/15/20: EF 45-50%, global hypokinesis, grade I DD, mild to moderate MR, mild to moderate TR     Neuro/Psych  Headaches, Anxiety    GI/Hepatic Neg liver ROS, GERD  Medicated and Controlled,  Endo/Other  negative endocrine ROS  Renal/GU negative Renal ROS  negative genitourinary   Musculoskeletal negative musculoskeletal ROS (+)   Abdominal   Peds  Hematology negative hematology ROS (+)   Anesthesia Other Findings Day of surgery medications reviewed with patient.  Reproductive/Obstetrics negative OB ROS                            Anesthesia Physical Anesthesia Plan  ASA: III  Anesthesia Plan: MAC   Post-op Pain Management:    Induction:   PONV Risk Score and Plan: 2 and Treatment may vary due to age or medical condition, Midazolam and Propofol infusion  Airway Management Planned: Natural Airway and Simple Face Mask  Additional Equipment:   Intra-op Plan:   Post-operative Plan:   Informed Consent: I have reviewed the patients History and Physical, chart, labs and discussed the procedure including the risks, benefits and alternatives for the proposed anesthesia with the patient or authorized representative who has indicated his/her understanding and acceptance.       Plan Discussed with:  CRNA  Anesthesia Plan Comments:        Anesthesia Quick Evaluation

## 2020-09-16 ENCOUNTER — Ambulatory Visit (HOSPITAL_COMMUNITY): Payer: BLUE CROSS/BLUE SHIELD | Admitting: Certified Registered Nurse Anesthetist

## 2020-09-16 ENCOUNTER — Ambulatory Visit (HOSPITAL_COMMUNITY)
Admission: RE | Admit: 2020-09-16 | Discharge: 2020-09-16 | Disposition: A | Discharge status: Home / Self Care | Payer: BLUE CROSS/BLUE SHIELD | Attending: Cardiology | Admitting: Cardiology

## 2020-09-16 ENCOUNTER — Encounter (HOSPITAL_COMMUNITY): Payer: Self-pay | Admitting: Cardiology

## 2020-09-16 ENCOUNTER — Ambulatory Visit (HOSPITAL_COMMUNITY)
Admission: RE | Disposition: A | Discharge status: Home / Self Care | Payer: Self-pay | Source: Home / Self Care | Attending: Cardiology

## 2020-09-16 DIAGNOSIS — I493 Ventricular premature depolarization: Secondary | ICD-10-CM | POA: Diagnosis not present

## 2020-09-16 DIAGNOSIS — I471 Supraventricular tachycardia: Secondary | ICD-10-CM | POA: Insufficient documentation

## 2020-09-16 DIAGNOSIS — Z888 Allergy status to other drugs, medicaments and biological substances status: Secondary | ICD-10-CM | POA: Diagnosis not present

## 2020-09-16 DIAGNOSIS — F419 Anxiety disorder, unspecified: Secondary | ICD-10-CM | POA: Diagnosis not present

## 2020-09-16 DIAGNOSIS — K219 Gastro-esophageal reflux disease without esophagitis: Secondary | ICD-10-CM | POA: Diagnosis not present

## 2020-09-16 DIAGNOSIS — Z8719 Personal history of other diseases of the digestive system: Secondary | ICD-10-CM | POA: Insufficient documentation

## 2020-09-16 DIAGNOSIS — I420 Dilated cardiomyopathy: Secondary | ICD-10-CM | POA: Diagnosis not present

## 2020-09-16 DIAGNOSIS — K649 Unspecified hemorrhoids: Secondary | ICD-10-CM | POA: Diagnosis not present

## 2020-09-16 DIAGNOSIS — Z8249 Family history of ischemic heart disease and other diseases of the circulatory system: Secondary | ICD-10-CM | POA: Insufficient documentation

## 2020-09-16 HISTORY — PX: SVT ABLATION: EP1225

## 2020-09-16 SURGERY — SVT ABLATION
Anesthesia: General

## 2020-09-16 MED ORDER — LIDOCAINE 2% (20 MG/ML) 5 ML SYRINGE
INTRAMUSCULAR | Status: DC | PRN
  Administered 2020-09-16: 20 mg via INTRAVENOUS

## 2020-09-16 MED ORDER — METOPROLOL SUCCINATE ER 50 MG PO TB24: 50.0000 mg | ORAL_TABLET | Freq: Every day | ORAL | 3 refills | Status: DC

## 2020-09-16 MED ORDER — MIDAZOLAM HCL 2 MG/2ML IJ SOLN
INTRAMUSCULAR | Status: DC | PRN
  Administered 2020-09-16: 2 mg via INTRAVENOUS

## 2020-09-16 MED ORDER — ACETAMINOPHEN 325 MG PO TABS
650.0000 mg | ORAL_TABLET | ORAL | Status: DC | PRN
  Administered 2020-09-16: 650 mg via ORAL
  Filled 2020-09-16 (×3): qty 2

## 2020-09-16 MED ORDER — HEPARIN SODIUM (PORCINE) 1000 UNIT/ML IJ SOLN
INTRAMUSCULAR | Status: AC
  Filled 2020-09-16: qty 1

## 2020-09-16 MED ORDER — SODIUM CHLORIDE 0.9% FLUSH: 3.0000 mL | Freq: Two times a day (BID) | INTRAVENOUS | Status: DC

## 2020-09-16 MED ORDER — SODIUM CHLORIDE 0.9% FLUSH: 3.0000 mL | INTRAVENOUS | Status: DC | PRN

## 2020-09-16 MED ORDER — SODIUM CHLORIDE 0.9 % IV SOLN: 250.0000 mL | INTRAVENOUS | Status: DC | PRN

## 2020-09-16 MED ORDER — SODIUM CHLORIDE 0.9 % IV SOLN: INTRAVENOUS | Status: DC

## 2020-09-16 MED ORDER — HEPARIN SODIUM (PORCINE) 1000 UNIT/ML IJ SOLN
INTRAMUSCULAR | Status: DC | PRN
  Administered 2020-09-16: 1000 [IU] via INTRAVENOUS

## 2020-09-16 MED ORDER — PROPOFOL 10 MG/ML IV BOLUS
INTRAVENOUS | Status: DC | PRN
  Administered 2020-09-16 (×2): 10 mg via INTRAVENOUS

## 2020-09-16 MED ORDER — HEPARIN (PORCINE) IN NACL 1000-0.9 UT/500ML-% IV SOLN
INTRAVENOUS | Status: DC | PRN
  Administered 2020-09-16 (×2): 500 mL

## 2020-09-16 MED ORDER — PHENYLEPHRINE HCL-NACL 10-0.9 MG/250ML-% IV SOLN
INTRAVENOUS | Status: DC | PRN
  Administered 2020-09-16: 20 ug/min via INTRAVENOUS

## 2020-09-16 MED ORDER — ISOPROTERENOL HCL 0.2 MG/ML IJ SOLN
INTRAMUSCULAR | Status: AC
  Filled 2020-09-16: qty 5

## 2020-09-16 MED ORDER — ONDANSETRON HCL 4 MG/2ML IJ SOLN: 4.0000 mg | Freq: Four times a day (QID) | INTRAMUSCULAR | Status: DC | PRN

## 2020-09-16 MED ORDER — FENTANYL CITRATE (PF) 250 MCG/5ML IJ SOLN
INTRAMUSCULAR | Status: DC | PRN
  Administered 2020-09-16 (×2): 25 ug via INTRAVENOUS

## 2020-09-16 MED ORDER — PROPOFOL 500 MG/50ML IV EMUL
INTRAVENOUS | Status: DC | PRN
  Administered 2020-09-16: 80 ug/kg/min via INTRAVENOUS

## 2020-09-16 MED ORDER — ISOPROTERENOL HCL 0.2 MG/ML IJ SOLN
INTRAVENOUS | Status: DC | PRN
  Administered 2020-09-16: 2 ug/min via INTRAVENOUS

## 2020-09-16 MED ORDER — BUPIVACAINE HCL (PF) 0.25 % IJ SOLN
INTRAMUSCULAR | Status: AC
  Filled 2020-09-16: qty 30

## 2020-09-16 SURGICAL SUPPLY — 16 items
BAG SNAP BAND KOVER 36X36 (MISCELLANEOUS) ×2 IMPLANT
BLANKET WARM UNDERBOD FULL ACC (MISCELLANEOUS) ×2 IMPLANT
CATH CRD2 QUAD 6FR 5 (CATHETERS) ×2 IMPLANT
CATH JOSEPH QUAD ALLRED 6F REP (CATHETERS) ×2 IMPLANT
CATH SMTCH THERMOCOOL SF DF (CATHETERS) ×2 IMPLANT
CATH WEBSTER BI DIR CS D-F CRV (CATHETERS) ×2 IMPLANT
CLOSURE PERCLOSE PROSTYLE (VASCULAR PRODUCTS) ×6 IMPLANT
PACK EP LATEX FREE (CUSTOM PROCEDURE TRAY) ×2
PACK EP LF (CUSTOM PROCEDURE TRAY) ×1 IMPLANT
PAD PRO RADIOLUCENT 2001M-C (PAD) ×2 IMPLANT
PATCH CARTO3 (PAD) ×2 IMPLANT
SHEATH CARTO VIZIGO SM CVD (SHEATH) ×2 IMPLANT
SHEATH PINNACLE 7F 10CM (SHEATH) ×2 IMPLANT
SHEATH PINNACLE 8F 10CM (SHEATH) ×4 IMPLANT
SHEATH PROBE COVER 6X72 (BAG) ×2 IMPLANT
TUBING SMART ABLATE COOLFLOW (TUBING) ×2 IMPLANT

## 2020-09-16 NOTE — Anesthesia Procedure Notes (Signed)
Procedure Name: MAC Date/Time: 09/16/2020 7:47 AM Performed by: Dorthea Cove, CRNA Pre-anesthesia Checklist: Patient identified, Emergency Drugs available, Suction available, Patient being monitored and Timeout performed Patient Re-evaluated:Patient Re-evaluated prior to induction Oxygen Delivery Method: Nasal cannula Preoxygenation: Pre-oxygenation with 100% oxygen Induction Type: IV induction Placement Confirmation: CO2 detector

## 2020-09-16 NOTE — Discharge Instructions (Signed)
Post procedure care instructions No driving for 4 days. No lifting over 5 lbs for 1 week. No vigorous or sexual activity for 1 week. You may return to work/your usual activities on 09/24/20. Keep procedure site clean & dry. If you notice increased pain, swelling, bleeding or pus, call/return!  You may shower after 24 hours, but no soaking in baths/hot tubs/pools for 1 week.    Femoral Site Care  This sheet gives you information about how to care for yourself after your procedure. Your health care provider may also give you more specific instructions. If you have problems or questions, contact your health care provider. What can I expect after the procedure? After the procedure, it is common to have:  Bruising that usually fades within 1-2 weeks.  Tenderness at the site. Follow these instructions at home: Wound care  Follow instructions from your health care provider about how to take care of your insertion site. Make sure you: ? Wash your hands with soap and water before you change your bandage (dressing). If soap and water are not available, use hand sanitizer. ? Change your dressing as told by your health care provider. ? Leave stitches (sutures), skin glue, or adhesive strips in place. These skin closures may need to stay in place for 2 weeks or longer. If adhesive strip edges start to loosen and curl up, you may trim the loose edges. Do not remove adhesive strips completely unless your health care provider tells you to do that.  Do not take baths, swim, or use a hot tub until your health care provider approves.  You may shower 24-48 hours after the procedure or as told by your health care provider. ? Gently wash the site with plain soap and water. ? Pat the area dry with a clean towel. ? Do not rub the site. This may cause bleeding.  Do not apply powder or lotion to the site. Keep the site clean and dry.  Check your femoral site every day for signs of infection. Check for: ? Redness,  swelling, or pain. ? Fluid or blood. ? Warmth. ? Pus or a bad smell. Activity  For the first 2-3 days after your procedure, or as long as directed: ? Avoid climbing stairs as much as possible. ? Do not squat.  Do not lift anything that is heavier than 10 lb (4.5 kg), or the limit that you are told, until your health care provider says that it is safe.  Rest as directed. ? Avoid sitting for a long time without moving. Get up to take short walks every 1-2 hours.  Do not drive for 24 hours if you were given a medicine to help you relax (sedative). General instructions  Take over-the-counter and prescription medicines only as told by your health care provider.  Keep all follow-up visits as told by your health care provider. This is important. Contact a health care provider if you have:  A fever or chills.  You have redness, swelling, or pain around your insertion site. Get help right away if:  The catheter insertion area swells very fast.  You pass out.  You suddenly start to sweat or your skin gets clammy.  The catheter insertion area is bleeding, and the bleeding does not stop when you hold steady pressure on the area.  The area near or just beyond the catheter insertion site becomes pale, cool, tingly, or numb. These symptoms may represent a serious problem that is an emergency. Do not wait to see  if the symptoms will go away. Get medical help right away. Call your local emergency services (911 in the [place] Do not drive yourself to the hospital. Summary  After the procedure, it is common to have bruising that usually fades within 1-2 weeks.  Check your femoral site every day for signs of infection.  Do not lift anything that is heavier than 10 lb (4.5 kg), or the limit that you are told, until your health care provider says that it is safe. This information is not intended to replace advice given to you by your health care provider. Make sure you discuss any questions you  have with your health care provider. Document Revised: 12/12/2019 Document Reviewed: 12/12/2019 Elsevier Patient Education  [place].

## 2020-09-16 NOTE — Transfer of Care (Signed)
Immediate Anesthesia Transfer of Care Note  Patient: Theresa Buck  Procedure(s) Performed: SVT ABLATION (N/A )  Patient Location: Cath Lab  Anesthesia Type:MAC  Level of Consciousness: awake, alert  and oriented  Airway & Oxygen Therapy: Patient Spontanous Breathing  Post-op Assessment: Report given to RN and Post -op Vital signs reviewed and stable  Post vital signs: Reviewed and stable  Last Vitals:  Vitals Value Taken Time  BP 121/76 09/16/20 1031  Temp    Pulse 76 09/16/20 1034  Resp 14 09/16/20 1034  SpO2 100 % 09/16/20 1034  Vitals shown include unvalidated device data.  Last Pain:  Vitals:   09/16/20 0628  TempSrc:   PainSc: 0-No pain      Patients Stated Pain Goal: 2 (13/88/71 9597)  Complications: No complications documented.

## 2020-09-16 NOTE — H&P (Signed)
Electrophysiology Office Note:    Date:  07/28/2020   ID:  Theresa Buck, DOB [birth year], MRN [id number]  PCP:  Einar Pheasant, MD  [place] Cardiologist:  No primary care provider on file.  [facility] Electrophysiologist:  None   Referring MD: Einar Pheasant, MD   Chief Complaint: Tachycardia  History of Present Illness:    Theresa Buck is a 65 y.o. female who presents for an evaluation of tachycardia at the request of Dr. Nicki Reaper..   The patient was last seen by Dr. Rockey Situ on July 20, 2020.  She has a long history of paroxysmal tachycardia that dates back least 10 years.  The EKG was thought to represent supraventricular tachycardia with aberrancy.  She had been previously treated with propranolol.  She presented to the emergency department on March 24 with more frequent episodes of palpitations.  When she woke up on the day of her ER presentation the palpitations did not go away.  She was ultimately seen in the ER and found to be in a wide-complex tachycardia with a ventricular rate of 220 bpm.  She tells me that she is never had a syncopal episode.  She thinks the episodes of tachycardia are becoming more frequent.  The episodes were started when she was in her teens but have become significantly more frequent.        Past Medical History:  Diagnosis Date  . Allergy    recurring sinus problems  . Anxiety   . Arthritis   . Deviated septum   . Deviated septum   . GERD (gastroesophageal reflux disease)   . H/O sinusitis   . Headache    H/O MIGRAINES  . Hx of degenerative disc disease   . SVT (supraventricular tachycardia) (HCC)          Past Surgical History:  Procedure Laterality Date  . BACK SURGERY  09/26/2013   LUMBAR  . COLONOSCOPY  08/03/2015   Dr Vira Agar  . HEMORRHOID SURGERY N/A 03/13/2017   Procedure: HEMORRHOIDECTOMY;  Surgeon: Christene Lye, MD;  Location: [facility] ORS;  Service: General;  Laterality: N/A;   . UPPER GI ENDOSCOPY  08/03/2015   Dr Vira Agar    Current Medications: Active Medications      Current Meds  Medication Sig  . Artificial Tear Solution (GENTEAL TEARS) 0.1-0.2-0.3 % SOLN Place 1 drop 3 times/day as needed-between meals & bedtime into both eyes (dry eyes).  . Biotin 5000 MCG TABS Take 1 tablet daily by mouth.   . Calcium Carb-Cholecalciferol (CALCIUM + D3 PO) Take 1 tablet daily by mouth.  . doxycycline (MONODOX) 100 MG capsule Take 1 capsule (100 mg total) by mouth 2 (two) times daily for 10 days. Take with food and drink  . estradiol (ESTRACE) 0.1 MG/GM vaginal cream Place 1 Applicatorful at bedtime vaginally.  Marland Kitchen estradiol (ESTRACE) 1 MG tablet Take 0.5 mg daily by mouth.   . fluticasone (FLONASE) 50 MCG/ACT nasal spray SHAKE LIQUID AND USE 2 SPRAYS IN EACH NOSTRIL EVERY DAY  . hydrocortisone (ANUSOL-HC) 25 MG suppository Place 1 suppository (25 mg total) rectally 2 (two) times daily.  . montelukast (SINGULAIR) 10 MG tablet Take 10 mg at bedtime by mouth.   . Multiple Vitamin (MULTIVITAMIN) tablet Take 1 tablet by mouth daily.  . pantoprazole (PROTONIX) 40 MG tablet TAKE 1 TABLET BY MOUTH EVERY DAY  . Probiotic Product (PROBIOTIC ADVANCED PO) Take 1 capsule daily by mouth.   . progesterone (PROMETRIUM) 100 MG capsule Take 100  mg daily by mouth.  . traZODone (DESYREL) 50 MG tablet take 1/2 to 1 tablets by mouth at bedtime if needed for sleep  . [DISCONTINUED] metoprolol tartrate (LOPRESSOR) 25 MG tablet Take 1 tablet (25 mg total) by mouth 2 (two) times daily.  . [DISCONTINUED] metoprolol tartrate (LOPRESSOR) 25 MG tablet Take 1.5 tablets (37.5 mg total) by mouth 2 (two) times daily.  . [DISCONTINUED] propranolol (INDERAL) 20 MG tablet Take 1 tablet (20 mg total) by mouth 3 (three) times daily as needed.       Allergies:   Prednisone and Ceftin [cefuroxime axetil]   Social History        Socioeconomic History  . Marital status: Widowed    Spouse name:  Not on file  . Number of children: 3  . Years of education: Not on file  . Highest education level: Not on file  Occupational History  . Not on file  Tobacco Use  . Smoking status: Never Smoker  . Smokeless tobacco: Never Used  Vaping Use  . Vaping Use: Never used  Substance and Sexual Activity  . Alcohol use: No    Alcohol/week: 0.0 standard drinks  . Drug use: No  . Sexual activity: Not on file  Other Topics Concern  . Not on file  Social History Narrative  . Not on file   Social Determinants of Health   Financial Resource Strain: Not on file  Food Insecurity: Not on file  Transportation Needs: Not on file  Physical Activity: Not on file  Stress: Not on file  Social Connections: Not on file     Family History: The patient's family history includes ALS in her father; Brain cancer in her mother; Diabetes in her brother; Heart disease in her son; Throat cancer in her brother. There is no history of Breast cancer.  ROS:   Please see the history of present illness.    All other systems reviewed and are negative.  EKGs/Labs/Other Studies Reviewed:    The following studies were reviewed today:  July 15, 2020 EKG Wide-complex tachycardia with a rapid initial deflection of the QRS.   July 20, 2020 EKG Sinus rhythm.  No evidence of preexcitation.      EKG:  The ekg ordered today demonstrates sinus rhythm frequent monomorphic PVCs.  PVCs have a sharply inferior axis.  Recent Labs: 07/15/2020: TSH 2.122 07/28/2020: ALT 16; B Natriuretic Peptide 32.2; BUN 22; Creatinine, Ser 0.70; Hemoglobin 14.4; Magnesium 2.1; Platelets 225; Potassium 4.4; Sodium 139  Recent Lipid Panel Labs (Brief)          Component Value Date/Time   CHOL 199 03/25/2020 0802   TRIG 95.0 03/25/2020 0802   HDL 51.50 03/25/2020 0802   CHOLHDL 4 03/25/2020 0802   VLDL 19.0 03/25/2020 0802   LDLCALC 129 (H) 03/25/2020 0802      Physical Exam:    VS:  BP 106/68 (BP  Location: Left Arm, Patient Position: Sitting, Cuff Size: Normal)   Pulse 80   Ht 5\' 6"  (1.676 m)   Wt 138 lb (62.6 kg)   LMP 07/15/2012   SpO2 97%   BMI 22.27 kg/m        Wt Readings from Last 3 Encounters:  07/28/20 138 lb (62.6 kg)  07/28/20 138 lb (62.6 kg)  07/20/20 139 lb (63 kg)     GEN:  Well nourished, well developed in no acute distress HEENT: Normal NECK: No JVD; No carotid bruits LYMPHATICS: No lymphadenopathy CARDIAC: RRR, no murmurs, rubs,  gallops RESPIRATORY:  Clear to auscultation without rales, wheezing or rhonchi  ABDOMEN: Soft, non-tender, non-distended MUSCULOSKELETAL:  No edema; No deformity  SKIN: Warm and dry NEUROLOGIC:  Alert and oriented x 3 PSYCHIATRIC:  Normal affect   ASSESSMENT:    1. SVT (supraventricular tachycardia) ([place])   2. Dilated cardiomyopathy ([place])   3. Nonischemic cardiomyopathy ([place])   4. PVC (premature ventricular contraction)    PLAN:    In order of problems listed above:  1. Tachycardia Wide-complex with a ventricular rate of approximately 220 bpm.  Favor this to be an SVT with aberrancy although I cannot completely exclude ventricular tachycardia.  Discussed management options for her tachycardia including pharmacologic suppression or EP study with possible ablation.  Given the highly symptomatic nature of the tachycardia and the increasing frequency of the episodes, she would like to proceed with EP study with possible ablation.  I discussed the procedure in detail with the patient and her husband who is with her today and they would like to proceed.  In the meantime she will increase her metoprolol to 37.5 mg twice daily.  I have asked her to hold her beta-blocker for 48 hours prior to the ablation procedure to maximize her chances of being able to induce the arrhythmia.  Therapeutic strategies for tachycardia including medicine and ablation were discussed in detail with the patient today. Risk, benefits, and  alternatives to EP study and radiofrequency ablation were also discussed in detail today. These risks include but are not limited to stroke, bleeding, vascular damage, tamponade, perforation, damage to the heart and other structures, AV block requiring pacemaker, worsening renal function, and death. The patient understands these risk and wishes to proceed.  We will therefore proceed with catheter ablation at the next available time.  2.  Frequent PVCs Appear to be originating from the outflow tract.  We will plan to quantify the PVCs using a ZIO monitor.  3.  Chest pain Prompted her most recent presentation to the emergency department.  High-sensitivity troponins were negative at that time which should exclude significant coronary disease.  Coronary CTA scheduled for tomorrow.     ----------------------------------------------------------------------------------------  I have seen, examined the patient, and reviewed the above assessment and plan.    Plan for EP study and possible ablation for symptomatic tachycardia. Reviewed procedure and risk this AM.  Risk, benefits, and alternatives to EP study and radiofrequency ablation for SVT were also discussed in detail today. These risks include but are not limited to complete heart block, stroke, bleeding, vascular damage, tamponade, perforation, and death. The patient understands these risk and wishes to proceed.    Vickie Epley, MD 09/16/2020 7:16 AM

## 2020-09-16 NOTE — Progress Notes (Addendum)
Purewick placed for pt comfort, +void noted, see flowsheet for results  [place] place for co2 reading, pt began sneezing without stop, cannula removed, sneezing decreased, will obtain co2 reading at 30 minutes, right groin remains wnl, no bleeding noted, safety maintained

## 2020-09-16 NOTE — Anesthesia Postprocedure Evaluation (Addendum)
Anesthesia Post Note  Patient: DESHANTA LADY  Procedure(s) Performed: SVT ABLATION (N/A )     Patient location during evaluation: PACU Anesthesia Type: MAC Level of consciousness: awake and alert and oriented Pain management: pain level controlled Vital Signs Assessment: post-procedure vital signs reviewed and stable Respiratory status: spontaneous breathing, nonlabored ventilation and respiratory function stable Cardiovascular status: blood pressure returned to baseline Postop Assessment: no apparent nausea or vomiting Anesthetic complications: no   No complications documented.  Last Vitals:  Vitals:   09/16/20 1105 09/16/20 1110  BP: 117/78 127/83  Pulse: 73 65  Resp: 12 (!) 8  Temp:    SpO2: 100% 100%    Last Pain:  Vitals:   09/16/20 1100  TempSrc: Temporal  PainSc: 0-No pain                 Brennan Bailey

## 2020-09-16 NOTE — Addendum Note (Signed)
Addendum  created 09/16/20 1126 by Brennan Bailey, MD   Clinical Note Signed

## 2020-09-17 MED FILL — Lidocaine HCl Local Preservative Free (PF) Inj 1%: INTRAMUSCULAR | Qty: 30 | Status: AC

## 2020-09-20 ENCOUNTER — Encounter: Payer: Self-pay | Admitting: Intensive Care

## 2020-09-20 ENCOUNTER — Emergency Department
Admission: EM | Admit: 2020-09-20 | Discharge: 2020-09-20 | Disposition: A | Discharge status: Home / Self Care | Payer: BLUE CROSS/BLUE SHIELD | Attending: Emergency Medicine | Admitting: Emergency Medicine

## 2020-09-20 ENCOUNTER — Other Ambulatory Visit: Payer: Self-pay

## 2020-09-20 DIAGNOSIS — S161XXA Strain of muscle, fascia and tendon at neck level, initial encounter: Secondary | ICD-10-CM | POA: Diagnosis not present

## 2020-09-20 DIAGNOSIS — R1031 Right lower quadrant pain: Secondary | ICD-10-CM | POA: Insufficient documentation

## 2020-09-20 DIAGNOSIS — S199XXA Unspecified injury of neck, initial encounter: Secondary | ICD-10-CM | POA: Diagnosis not present

## 2020-09-20 DIAGNOSIS — Y92410 Unspecified street and highway as the place of occurrence of the external cause: Secondary | ICD-10-CM | POA: Insufficient documentation

## 2020-09-20 DIAGNOSIS — M545 Low back pain, unspecified: Secondary | ICD-10-CM | POA: Insufficient documentation

## 2020-09-20 DIAGNOSIS — I471 Supraventricular tachycardia: Secondary | ICD-10-CM | POA: Diagnosis not present

## 2020-09-20 DIAGNOSIS — M549 Dorsalgia, unspecified: Secondary | ICD-10-CM | POA: Diagnosis not present

## 2020-09-20 DIAGNOSIS — Z8616 Personal history of COVID-19: Secondary | ICD-10-CM | POA: Diagnosis not present

## 2020-09-20 DIAGNOSIS — M542 Cervicalgia: Secondary | ICD-10-CM | POA: Diagnosis not present

## 2020-09-20 HISTORY — DX: Other chronic pain: G89.29

## 2020-09-20 HISTORY — DX: Scoliosis, unspecified: M41.9
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
## 2020-09-20 NOTE — Discharge Instructions (Addendum)
Please seek medical attention for any worsening bruising, swelling, pain around the site of the incision for your ablation. Please seek medical attention for any significant chest pain, shortness of breath or any other new or concerning symptoms.
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
## 2020-09-22 ENCOUNTER — Telehealth: Payer: Self-pay | Admitting: Cardiology

## 2020-09-22 NOTE — Telephone Encounter (Signed)
Sent mychart message advising issue is normal

## 2020-09-22 NOTE — Telephone Encounter (Signed)
Patient has recent ablation on 5/26 and is now experiencing some pain. Patient reports pain in R upper leg when she moves and now some parts feel numb and tingly, inner thigh  Please advise

## 2020-09-30 ENCOUNTER — Encounter: Payer: Self-pay | Admitting: Internal Medicine

## 2020-10-08 DIAGNOSIS — M9905 Segmental and somatic dysfunction of pelvic region: Secondary | ICD-10-CM | POA: Diagnosis not present

## 2020-10-08 DIAGNOSIS — M5417 Radiculopathy, lumbosacral region: Secondary | ICD-10-CM | POA: Diagnosis not present

## 2020-10-08 DIAGNOSIS — M9903 Segmental and somatic dysfunction of lumbar region: Secondary | ICD-10-CM | POA: Diagnosis not present

## 2020-10-08 DIAGNOSIS — M41125 Adolescent idiopathic scoliosis, thoracolumbar region: Secondary | ICD-10-CM | POA: Diagnosis not present

## 2020-10-20 DIAGNOSIS — H18831 Recurrent erosion of cornea, right eye: Secondary | ICD-10-CM | POA: Diagnosis not present

## 2020-10-20 DIAGNOSIS — H01111 Allergic dermatitis of right upper eyelid: Secondary | ICD-10-CM | POA: Diagnosis not present

## 2020-10-20 DIAGNOSIS — H5213 Myopia, bilateral: Secondary | ICD-10-CM | POA: Diagnosis not present

## 2020-10-20 DIAGNOSIS — H02889 Meibomian gland dysfunction of unspecified eye, unspecified eyelid: Secondary | ICD-10-CM | POA: Diagnosis not present

## 2020-10-26 NOTE — Progress Notes (Signed)
Electrophysiology Office Follow up Visit Note:    Date:  10/27/2020   ID:  Theresa Buck, DOB [birth year], MRN [id number]  PCP:  Einar Pheasant, MD  [facility] Cardiologist:  None  [facility] Electrophysiologist:  Vickie Epley, MD    Interval History:    Theresa Buck is a 65 y.o. female who presents for a follow up visit after a successful SVT ablation on 09/16/2020. 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   She has done well since the ablation.  She has had no recurrence of her arrhythmia.  Her groin sites have healed well.  She does have some residual numbness medial to the puncture sites which has dramatically improved since the procedure.  Past Medical History:  Diagnosis Date   Allergy    recurring sinus problems   Anxiety    Arthritis    Chronic back pain    Deviated septum    Deviated septum    GERD (gastroesophageal reflux disease)    H/O sinusitis    Headache    H/O MIGRAINES   Hx of degenerative disc disease    Scoliosis    SVT (supraventricular tachycardia) ([place])     Past Surgical History:  Procedure Laterality Date   BACK SURGERY  09/26/2013   LUMBAR   COLONOSCOPY  08/03/2015   Dr Vira Agar   HEMORRHOID SURGERY N/A 03/13/2017   Procedure: HEMORRHOIDECTOMY;  Surgeon: Christene Lye, MD;  Location: [facility] ORS;  Service: General;  Laterality: N/A;   SVT ABLATION N/A 09/16/2020   Procedure: SVT ABLATION;  Surgeon: Vickie Epley, MD;  Location: [place] [facility];  Service: Cardiovascular;  Laterality: N/A;   UPPER GI ENDOSCOPY  08/03/2015   Dr Vira Agar    Current Medications: Current Meds  Medication Sig   Biotin 5000 MCG TABS Take 5,000 mcg by mouth daily.   Calcium Carb-Cholecalciferol (CALCIUM + D3 PO) Take 1 tablet daily by mouth.   carboxymethylcellulose (REFRESH PLUS) 0.5 % SOLN Place 1 drop into both eyes 3 (three) times daily as needed (dry eyes).   estradiol (ESTRACE)  0.1 MG/GM vaginal cream Place 1 Applicatorful vaginally daily.   estradiol (ESTRACE) 1 MG tablet Take 0.5 mg daily by mouth.    hydrocortisone (ANUSOL-HC) 25 MG suppository Place 1 suppository (25 mg total) rectally 2 (two) times daily. (Patient taking differently: Place 25 mg rectally 2 (two) times daily as needed for hemorrhoids.)   metoprolol succinate (TOPROL XL) 50 MG 24 hr tablet Take 1 tablet (50 mg total) by mouth daily. Take with or immediately following a meal.   montelukast (SINGULAIR) 10 MG tablet Take 10 mg at bedtime by mouth.    Multiple Vitamin (MULTIVITAMIN) tablet Take 1 tablet by mouth daily.   pantoprazole (PROTONIX) 40 MG tablet TAKE 1 TABLET BY MOUTH EVERY DAY   Probiotic Product (PROBIOTIC ADVANCED PO) Take 1 capsule daily by mouth.    progesterone (PROMETRIUM) 100 MG capsule Take 100 mg daily by mouth.   traZODone (DESYREL) 50 MG tablet Take 0.5-1 tablets (25-50 mg total) by mouth at bedtime as needed for sleep. take 1/2 to 1 tablets by mouth at bedtime if needed for sleep   vitamin C (ASCORBIC ACID) 500 MG tablet Take 500 mg by mouth daily.     Allergies:   Ceftin [cefuroxime axetil]   Social History   Socioeconomic History   Marital status: Widowed    Spouse name: Not on file  Number of children: 3   Years of education: Not on file   Highest education level: Not on file  Occupational History   Not on file  Tobacco Use   Smoking status: Never   Smokeless tobacco: Never  Vaping Use   Vaping Use: Never used  Substance and Sexual Activity   Alcohol use: No    Alcohol/week: 0.0 standard drinks   Drug use: No   Sexual activity: Not on file  Other Topics Concern   Not on file  Social History Narrative   Not on file   Social Determinants of Health   Financial Resource Strain: Not on file  Food Insecurity: Not on file  Transportation Needs: Not on file  Physical Activity: Not on file  Stress: Not on file  Social Connections: Not on file     Family  History: The patient's family history includes ALS in her father; Brain cancer in her mother; Diabetes in her brother; Heart disease in her son; Throat cancer in her brother. There is no history of Breast cancer.  ROS:   Please see the history of present illness.    All other systems reviewed and are negative.  EKGs/Labs/Other Studies Reviewed:    The following studies were reviewed today:  EP study    Recent Labs: 07/28/2020: ALT 16; B Natriuretic Peptide 32.2; Magnesium 2.1 09/03/2020: TSH 1.18 09/15/2020: BUN 19; Creatinine, Ser 0.80; Hemoglobin 14.0; Platelets 219; Potassium 3.9; Sodium 138  Recent Lipid Panel    Component Value Date/Time   CHOL 199 03/25/2020 0802   TRIG 95.0 03/25/2020 0802   HDL 51.50 03/25/2020 0802   CHOLHDL 4 03/25/2020 0802   VLDL 19.0 03/25/2020 0802   LDLCALC 129 (H) 03/25/2020 0802    Physical Exam:    VS:  BP 112/74 (BP Location: Left Arm, Patient Position: Sitting, Cuff Size: Normal)   Pulse 78   Ht 5\' 6"  (1.676 m)   Wt 141 lb 12.8 oz (64.3 kg)   LMP 07/15/2012   SpO2 98%   BMI 22.89 kg/m     Wt Readings from Last 3 Encounters:  10/27/20 141 lb 12.8 oz (64.3 kg)  09/20/20 138 lb (62.6 kg)  09/16/20 138 lb (62.6 kg)     GEN:  Well nourished, well developed in no acute distress HEENT: Normal NECK: No JVD; No carotid bruits LYMPHATICS: No lymphadenopathy CARDIAC: RRR, no murmurs, rubs, gallops RESPIRATORY:  Clear to auscultation without rales, wheezing or rhonchi  ABDOMEN: Soft, non-tender, non-distended MUSCULOSKELETAL:  No edema; No deformity  SKIN: Warm and dry NEUROLOGIC:  Alert and oriented x 3 PSYCHIATRIC:  Normal affect   ASSESSMENT:    1. SVT (supraventricular tachycardia) ([place])   2. AVNRT (AV nodal re-entry tachycardia) ([place])   3. Dilated cardiomyopathy ([place])   4. Nonischemic cardiomyopathy ([place])    PLAN:    In order of problems listed above:   1. SVT (supraventricular tachycardia) ([place]) 2. AVNRT (AV nodal  re-entry tachycardia) [facility])  Patient doing well after ablation.  No recurrence of arrhythmia.  I would recommend she continue taking Toprol-XL for now.  I will plan to see her back in 1 year or sooner as needed.  3. Dilated cardiomyopathy ([place]) 4. Nonischemic cardiomyopathy ([facility])  NYHA class II.  Warm and dry on exam.  Last ejection fraction measured as 45% in March 2022.   Medication Adjustments/Labs and Tests Ordered: Current medicines are reviewed at length with the patient today.  Concerns regarding medicines are outlined above.  No orders of the defined types were placed in this encounter.  No orders of the defined types were placed in this encounter.    Signed, Lars Mage, MD, [facility], [facility] 10/27/2020 6:51 PM    Electrophysiology [facility]

## 2020-10-27 ENCOUNTER — Encounter: Payer: Self-pay | Admitting: Cardiology

## 2020-10-27 ENCOUNTER — Other Ambulatory Visit: Payer: Self-pay

## 2020-10-27 ENCOUNTER — Ambulatory Visit: Payer: BLUE CROSS/BLUE SHIELD | Admitting: Cardiology

## 2020-10-27 VITALS — BP 112/74 | HR 78 | Ht 66.0 in | Wt 141.8 lb

## 2020-10-27 DIAGNOSIS — I428 Other cardiomyopathies: Secondary | ICD-10-CM

## 2020-10-27 DIAGNOSIS — I471 Supraventricular tachycardia: Secondary | ICD-10-CM | POA: Diagnosis not present

## 2020-10-27 DIAGNOSIS — I420 Dilated cardiomyopathy: Secondary | ICD-10-CM

## 2020-10-27 NOTE — Patient Instructions (Addendum)
Medication Instructions:  Your physician recommends that you continue on your current medications as directed. Please refer to the Current Medication list given to you today. *If you need a refill on your cardiac medications before your next appointment, please call your pharmacy*  Lab Work: None ordered. If you have labs (blood work) drawn today and your tests are completely normal, you will receive your results only by: [place] (if you have MyChart) OR A paper copy in the mail If you have any lab test that is abnormal or we need to change your treatment, we will call you to review the results.  Testing/Procedures: None ordered.  Follow-Up: At [facility], you and your health needs are our priority.  As part of our continuing mission to provide you with exceptional heart care, we have created designated Provider Care Teams.  These Care Teams include your primary Cardiologist (physician) and Advanced Practice Providers (APPs -  Physician Assistants and Nurse Practitioners) who all work together to provide you with the care you need, when you need it.  Your next appointment:   Your physician wants you to follow-up in: 9 months with Dr. Quentin Ore.   You will receive a reminder letter in the mail two months in advance. If you don't receive a letter, please call our office to schedule the follow-up appointment.

## 2020-11-04 MED ORDER — METOPROLOL SUCCINATE ER 25 MG PO TB24: 25.0000 mg | ORAL_TABLET | Freq: Every day | ORAL | 3 refills | Status: DC

## 2020-11-19 DIAGNOSIS — M41125 Adolescent idiopathic scoliosis, thoracolumbar region: Secondary | ICD-10-CM | POA: Diagnosis not present

## 2020-11-19 DIAGNOSIS — M5417 Radiculopathy, lumbosacral region: Secondary | ICD-10-CM | POA: Diagnosis not present

## 2020-11-19 DIAGNOSIS — M9905 Segmental and somatic dysfunction of pelvic region: Secondary | ICD-10-CM | POA: Diagnosis not present

## 2020-11-19 DIAGNOSIS — M9903 Segmental and somatic dysfunction of lumbar region: Secondary | ICD-10-CM | POA: Diagnosis not present

## 2020-12-10 ENCOUNTER — Encounter: Payer: Self-pay | Admitting: Internal Medicine

## 2020-12-10 ENCOUNTER — Ambulatory Visit (INDEPENDENT_AMBULATORY_CARE_PROVIDER_SITE_OTHER): Payer: BLUE CROSS/BLUE SHIELD | Admitting: Internal Medicine

## 2020-12-10 ENCOUNTER — Other Ambulatory Visit: Payer: Self-pay

## 2020-12-10 DIAGNOSIS — F439 Reaction to severe stress, unspecified: Secondary | ICD-10-CM | POA: Diagnosis not present

## 2020-12-10 DIAGNOSIS — M9903 Segmental and somatic dysfunction of lumbar region: Secondary | ICD-10-CM | POA: Diagnosis not present

## 2020-12-10 DIAGNOSIS — K219 Gastro-esophageal reflux disease without esophagitis: Secondary | ICD-10-CM | POA: Diagnosis not present

## 2020-12-10 DIAGNOSIS — M9905 Segmental and somatic dysfunction of pelvic region: Secondary | ICD-10-CM | POA: Diagnosis not present

## 2020-12-10 DIAGNOSIS — M41125 Adolescent idiopathic scoliosis, thoracolumbar region: Secondary | ICD-10-CM | POA: Diagnosis not present

## 2020-12-10 DIAGNOSIS — M5417 Radiculopathy, lumbosacral region: Secondary | ICD-10-CM | POA: Diagnosis not present

## 2020-12-10 DIAGNOSIS — M5489 Other dorsalgia: Secondary | ICD-10-CM

## 2020-12-10 DIAGNOSIS — Z79899 Other long term (current) drug therapy: Secondary | ICD-10-CM

## 2020-12-10 DIAGNOSIS — I471 Supraventricular tachycardia: Secondary | ICD-10-CM

## 2020-12-10 NOTE — Progress Notes (Signed)
Patient ID: Theresa Buck, female   DOB: Jun 29, 1955, 65 y.o.   MRN: [id number]   Subjective:    Patient ID: Theresa Buck, female    DOB: 1955/06/27, 65 y.o.   MRN: [id number]  HPI This visit occurred during the SARS-CoV-2 public health emergency.  Safety protocols were in place, including screening questions prior to the visit, additional usage of staff PPE, and extensive cleaning of exam room while observing appropriate contact time as indicated for disinfecting solutions.   Patient here for a scheduled follow up.  Follow up regarding SVT - s/p SVT ablation and cardiomyopathy - EF 45%.  She is on toprol and doing well.  No chest pain.  Improvement in increased heart rate or palpitations.  Breathing stable.  No increased cough or congestion.  Bowels moving.  Back pain - flared this week.  Saw chiropractor this week.  Is some better.  Discussed further w/up.  She declines.  Discussed taking scheduled tylenol.  No abdominal pain.  Increased stress.    Past Medical History:  Diagnosis Date   Allergy    recurring sinus problems   Anxiety    Arthritis    Chronic back pain    Deviated septum    Deviated septum    GERD (gastroesophageal reflux disease)    H/O sinusitis    Headache    H/O MIGRAINES   Hx of degenerative disc disease    Scoliosis    SVT (supraventricular tachycardia) ([place])    Past Surgical History:  Procedure Laterality Date   BACK SURGERY  09/26/2013   LUMBAR   COLONOSCOPY  08/03/2015   Dr Vira Agar   HEMORRHOID SURGERY N/A 03/13/2017   Procedure: HEMORRHOIDECTOMY;  Surgeon: Christene Lye, MD;  Location: [facility] ORS;  Service: General;  Laterality: N/A;   SVT ABLATION N/A 09/16/2020   Procedure: SVT ABLATION;  Surgeon: Vickie Epley, MD;  Location: [place] [facility];  Service: Cardiovascular;  Laterality: N/A;   UPPER GI ENDOSCOPY  08/03/2015   Dr Vira Agar   Family History  Problem Relation Age of Onset   Brain cancer Mother    ALS Father    Diabetes  Brother        multiple   Heart disease Son        myocardial infarction, s/p CABG   Throat cancer Brother    Breast cancer Neg Hx    Social History   Socioeconomic History   Marital status: Widowed    Spouse name: Not on file   Number of children: 3   Years of education: Not on file   Highest education level: Not on file  Occupational History   Not on file  Tobacco Use   Smoking status: Never   Smokeless tobacco: Never  Vaping Use   Vaping Use: Never used  Substance and Sexual Activity   Alcohol use: No    Alcohol/week: 0.0 standard drinks   Drug use: No   Sexual activity: Not on file  Other Topics Concern   Not on file  Social History Narrative   Not on file   Social Determinants of Health   Financial Resource Strain: Not on file  Food Insecurity: Not on file  Transportation Needs: Not on file  Physical Activity: Not on file  Stress: Not on file  Social Connections: Not on file    Review of Systems  Constitutional:  Negative for appetite change and unexpected weight change.  HENT:  Negative for congestion and sinus pressure.  Respiratory:  Negative for cough, chest tightness and shortness of breath.   Cardiovascular:  Negative for chest pain, palpitations and leg swelling.  Gastrointestinal:  Negative for abdominal pain, diarrhea, nausea and vomiting.  Genitourinary:  Negative for difficulty urinating and dysuria.  Musculoskeletal:  Positive for back pain. Negative for joint swelling and myalgias.  Skin:  Negative for color change and rash.  Neurological:  Negative for dizziness and headaches.  Psychiatric/Behavioral:  Negative for agitation and dysphoric mood.       Objective:    Physical Exam Vitals reviewed.  Constitutional:      General: She is not in acute distress.    Appearance: Normal appearance.  HENT:     Head: Normocephalic and atraumatic.     Right Ear: External ear normal.     Left Ear: External ear normal.  Eyes:     General: No  scleral icterus.       Right eye: No discharge.        Left eye: No discharge.     Conjunctiva/sclera: Conjunctivae normal.  Neck:     Thyroid: No thyromegaly.  Cardiovascular:     Rate and Rhythm: Normal rate and regular rhythm.  Pulmonary:     Effort: No respiratory distress.     Breath sounds: Normal breath sounds. No wheezing.  Abdominal:     General: Bowel sounds are normal.     Palpations: Abdomen is soft.     Tenderness: There is no abdominal tenderness.  Musculoskeletal:        General: No swelling.     Cervical back: Neck supple. No tenderness.     Comments: Back pain - increased pain - paraspinus muscle.    Lymphadenopathy:     Cervical: No cervical adenopathy.  Skin:    Findings: No erythema or rash.  Neurological:     Mental Status: She is alert.  Psychiatric:        Mood and Affect: Mood normal.        Behavior: Behavior normal.    BP 120/78   Pulse 80   Temp 97.8 F (36.6 C)   Ht 5' 5.98" (1.676 m)   Wt 139 lb 9.6 oz (63.3 kg)   LMP 07/15/2012   SpO2 99%   BMI 22.54 kg/m  Wt Readings from Last 3 Encounters:  12/10/20 139 lb 9.6 oz (63.3 kg)  10/27/20 141 lb 12.8 oz (64.3 kg)  09/20/20 138 lb (62.6 kg)    Outpatient Encounter Medications as of 12/10/2020  Medication Sig   Biotin 5000 MCG TABS Take 5,000 mcg by mouth daily.   Calcium Carb-Cholecalciferol (CALCIUM + D3 PO) Take 1 tablet daily by mouth.   carboxymethylcellulose (REFRESH PLUS) 0.5 % SOLN Place 1 drop into both eyes 3 (three) times daily as needed (dry eyes).   estradiol (ESTRACE) 1 MG tablet Take 0.5 mg daily by mouth.    hydrocortisone (ANUSOL-HC) 25 MG suppository Place 1 suppository (25 mg total) rectally 2 (two) times daily. (Patient taking differently: Place 25 mg rectally 2 (two) times daily as needed for hemorrhoids.)   metoprolol succinate (TOPROL XL) 25 MG 24 hr tablet Take 1 tablet (25 mg total) by mouth daily.   montelukast (SINGULAIR) 10 MG tablet Take 10 mg at bedtime by  mouth.    Multiple Vitamin (MULTIVITAMIN) tablet Take 1 tablet by mouth daily.   pantoprazole (PROTONIX) 40 MG tablet TAKE 1 TABLET BY MOUTH EVERY DAY   Probiotic Product (PROBIOTIC ADVANCED PO) Take 1  capsule daily by mouth.    progesterone (PROMETRIUM) 100 MG capsule Take 100 mg daily by mouth.   traZODone (DESYREL) 50 MG tablet Take 0.5-1 tablets (25-50 mg total) by mouth at bedtime as needed for sleep. take 1/2 to 1 tablets by mouth at bedtime if needed for sleep   vitamin C (ASCORBIC ACID) 500 MG tablet Take 500 mg by mouth daily.   [DISCONTINUED] estradiol (ESTRACE) 0.1 MG/GM vaginal cream Place 1 Applicatorful vaginally daily.   estradiol (ESTRACE) 0.1 MG/GM vaginal cream Place 1 Applicatorful vaginally daily.   [DISCONTINUED] fluticasone (FLONASE) 50 MCG/ACT nasal spray SHAKE LIQUID AND USE 2 SPRAYS IN EACH NOSTRIL EVERY DAY (Patient not taking: No sig reported)   [DISCONTINUED] polycarbophil (FIBERCON) 625 MG tablet Take 625 mg by mouth daily. (Patient not taking: No sig reported)   No facility-administered encounter medications on file as of 12/10/2020.     Lab Results  Component Value Date   WBC 6.4 09/15/2020   HGB 14.0 09/15/2020   HCT 42.8 09/15/2020   PLT 219 09/15/2020   GLUCOSE 89 09/15/2020   CHOL 199 03/25/2020   TRIG 95.0 03/25/2020   HDL 51.50 03/25/2020   LDLCALC 129 (H) 03/25/2020   ALT 16 07/28/2020   AST 17 07/28/2020   NA 138 09/15/2020   K 3.9 09/15/2020   CL 103 09/15/2020   CREATININE 0.80 09/15/2020   BUN 19 09/15/2020   CO2 26 09/15/2020   TSH 1.18 09/03/2020       Assessment & Plan:   Problem List Items Addressed This Visit     Back pain    Back pain as outlined.  Appears to be more msk in origin.  Has scoliosis.  Saw chiropractor.  Discussed muscle relaxer.  Will hold.  Tylenol as directed.  Follow.       Current use of estrogen therapy    Discussed.  Wants to continue.  Discussed possible taper in future.       Gastro-esophageal  reflux disease without esophagitis    No upper symptoms reported. On protonix.        Stress    Increased stress.  Will notify me if feels needs any further intervention.       Supraventricular tachycardia ([place])    S/p ablation.  On metoprolol.  Doing better.  No changes.         Einar Pheasant, MD

## 2020-12-11 ENCOUNTER — Encounter: Payer: Self-pay | Admitting: Internal Medicine

## 2020-12-12 DIAGNOSIS — Z79899 Other long term (current) drug therapy: Secondary | ICD-10-CM | POA: Insufficient documentation

## 2020-12-12 DIAGNOSIS — M549 Dorsalgia, unspecified: Secondary | ICD-10-CM | POA: Insufficient documentation

## 2020-12-12 MED ORDER — ESTRADIOL 0.1 MG/GM VA CREA: 1.0000 | TOPICAL_CREAM | Freq: Every day | VAGINAL | 1 refills | Status: DC

## 2020-12-12 NOTE — Assessment & Plan Note (Signed)
S/p ablation.  On metoprolol.  Doing better.  No changes.

## 2020-12-12 NOTE — Assessment & Plan Note (Signed)
Back pain as outlined.  Appears to be more msk in origin.  Has scoliosis.  Saw chiropractor.  Discussed muscle relaxer.  Will hold.  Tylenol as directed.  Follow.

## 2020-12-12 NOTE — Assessment & Plan Note (Signed)
No upper symptoms reported.  On protonix.   

## 2020-12-12 NOTE — Assessment & Plan Note (Signed)
Discussed.  Wants to continue.  Discussed possible taper in future.  

## 2020-12-12 NOTE — Assessment & Plan Note (Signed)
Increased stress.  Will notify me if feels needs any further intervention.

## 2020-12-15 ENCOUNTER — Ambulatory Visit
Admission: EM | Admit: 2020-12-15 | Discharge: 2020-12-15 | Disposition: A | Discharge status: Home / Self Care | Payer: BLUE CROSS/BLUE SHIELD | Attending: Emergency Medicine | Admitting: Emergency Medicine

## 2020-12-15 ENCOUNTER — Other Ambulatory Visit: Payer: Self-pay

## 2020-12-15 ENCOUNTER — Encounter: Payer: Self-pay | Admitting: Emergency Medicine

## 2020-12-15 ENCOUNTER — Ambulatory Visit: Payer: Self-pay

## 2020-12-15 DIAGNOSIS — N898 Other specified noninflammatory disorders of vagina: Secondary | ICD-10-CM

## 2020-12-15 LAB — POCT URINALYSIS DIP (MANUAL ENTRY)
Bilirubin, UA: NEGATIVE
Blood, UA: NEGATIVE
Glucose, UA: NEGATIVE mg/dL
Ketones, POC UA: NEGATIVE mg/dL
Leukocytes, UA: NEGATIVE
Nitrite, UA: NEGATIVE
Protein Ur, POC: NEGATIVE mg/dL
Spec Grav, UA: 1.015 (ref 1.010–1.025)
Urobilinogen, UA: 0.2 E.U./dL
pH, UA: 8.5 — AB (ref 5.0–8.0)

## 2020-12-15 NOTE — ED Triage Notes (Addendum)
Pt here with burning on urination x 3 days. Also has some clitoral swelling and constant burning. No discharge and no foul odor

## 2020-12-15 NOTE — Discharge Instructions (Signed)
Use topical hydrocortisone cream 0.5% to 2.5% strength once daily to the vulvar region.  We will call you with any positive results from your swab obtained in clinic today within the next 2 to 3 days.  If you do not hear from [place] within the next 2 to 3 days, please check your MyChart account for up-to-date health information.  If you have any worsening of pain to the area, worsening swelling, vaginal discharge, vaginal odor, fever, vomiting, painful urination return to clinic for recheck.

## 2020-12-15 NOTE — ED Provider Notes (Addendum)
Subjective:    MERRIANN FOGEL is a very pleasant 65 y.o. female who presents with concerns due to burning on urination onset 3 days ago along with clitoral swelling and constant burning.  Patient states that she has recently changed soaps to an antibacterial Dial soap.  Patient reports that she does use Estrace cream, but states that she has used this for many years without this issue occurring. No unilateral back pain, vomiting, fever, vaginal discharge, concern for STD.  Past medical history, past surgical history, current medications reviewed.  Allergies: is allergic to ceftin [cefuroxime axetil].  Review of Systems See HPI   Objective:     Vitals:   12/15/20 1950  BP: 132/80  Pulse: 74  Resp: 20  Temp: 98.2 F (36.8 C)  SpO2: 95%     General: Appears well-developed and well-nourished. No acute distress.  Cardiovascular: Normal rate Pulm/Chest: No respiratory distress Neurological: Alert and oriented to person, place, and time.  Skin: Skin is warm and dry.  Psychiatric: Normal mood, affect, behavior, and thought content.  GU: Normal genitalia with mild swelling to the clitoral hood region without discharge.  Laboratory:  Orders Placed This Encounter  Procedures   POCT urinalysis dipstick   Results for orders placed or performed during the hospital encounter of 12/15/20  POCT urinalysis dipstick  Result Value Ref Range   Color, UA yellow yellow   Clarity, UA clear clear   Glucose, UA negative negative mg/dL   Bilirubin, UA negative negative   Ketones, POC UA negative negative mg/dL   Spec Grav, UA 1.015 1.010 - 1.025   Blood, UA negative negative   pH, UA 8.5 (A) 5.0 - 8.0   Protein Ur, POC negative negative mg/dL   Urobilinogen, UA 0.2 0.2 or 1.0 E.U./dL   Nitrite, UA Negative Negative   Leukocytes, UA Negative Negative    -Urinalysis reveals 8.5 pH, otherwise normal. Assessment:   1. Vaginal irritation  Plan:   MDM: Patient presents with concerns due  to burning on urination onset 3 days ago along with clitoral swelling and constant burning.  Patient states that she has recently changed soaps to an antibacterial Dial soap.  Patient reports that she does use Estrace cream, but states that she has used this for many years without this issue occurring. No unilateral back pain, vomiting, fever, vaginal discharge, concern for STD.  Chart review completed.  Given symptoms along with assessment findings, likely vaginal irritation to the vulvar region.  Advised use of over-the-counter hydrocortisone cream 0.5% to 2.5% strength once daily to the vulvar region.  We did complete a urinalysis in clinic today which reveals an increased pH of 8.5, otherwise normal.  APTIMA swabbed obtained in clinic today, advised that we would call with any positive results.  Advised that negative results would upload directly to her MyChart account.  Advised to return with any worsening of pain to the area, worsening swelling, vaginal discharge, vaginal odor, fever, vomiting, painful urination.  Patient verbalized understanding and agreed with plan.  Patient stable upon discharge.    Discharge Instructions      Use topical hydrocortisone cream 0.5% to 2.5% strength once daily to the vulvar region.  We will call you with any positive results from your swab obtained in clinic today within the next 2 to 3 days.  If you do not hear from [place] within the next 2 to 3 days, please check your MyChart account for up-to-date health information.  If you have any worsening  of pain to the area, worsening swelling, vaginal discharge, vaginal odor, fever, vomiting, painful urination return to clinic for recheck.         Serafina Royals, [place] 12/15/20 2013    Serafina Royals, [place] 12/15/20 2014

## 2020-12-17 ENCOUNTER — Ambulatory Visit: Payer: BLUE CROSS/BLUE SHIELD

## 2020-12-17 DIAGNOSIS — M5417 Radiculopathy, lumbosacral region: Secondary | ICD-10-CM | POA: Diagnosis not present

## 2020-12-17 DIAGNOSIS — M9903 Segmental and somatic dysfunction of lumbar region: Secondary | ICD-10-CM | POA: Diagnosis not present

## 2020-12-17 DIAGNOSIS — M9905 Segmental and somatic dysfunction of pelvic region: Secondary | ICD-10-CM | POA: Diagnosis not present

## 2020-12-17 DIAGNOSIS — M41125 Adolescent idiopathic scoliosis, thoracolumbar region: Secondary | ICD-10-CM | POA: Diagnosis not present

## 2020-12-17 LAB — CERVICOVAGINAL ANCILLARY ONLY
Bacterial Vaginitis (gardnerella): NEGATIVE
Candida Glabrata: NEGATIVE
Candida Vaginitis: NEGATIVE
Chlamydia: NEGATIVE
Comment: NEGATIVE
Comment: NEGATIVE
Comment: NEGATIVE
Comment: NEGATIVE
Comment: NEGATIVE
Comment: NORMAL
Neisseria Gonorrhea: NEGATIVE
Trichomonas: NEGATIVE

## 2020-12-21 DIAGNOSIS — H02889 Meibomian gland dysfunction of unspecified eye, unspecified eyelid: Secondary | ICD-10-CM | POA: Diagnosis not present

## 2020-12-21 DIAGNOSIS — H18831 Recurrent erosion of cornea, right eye: Secondary | ICD-10-CM | POA: Diagnosis not present

## 2020-12-21 DIAGNOSIS — H04129 Dry eye syndrome of unspecified lacrimal gland: Secondary | ICD-10-CM | POA: Diagnosis not present

## 2020-12-21 DIAGNOSIS — H5213 Myopia, bilateral: Secondary | ICD-10-CM | POA: Diagnosis not present

## 2020-12-22 ENCOUNTER — Encounter: Payer: Self-pay | Admitting: Internal Medicine

## 2020-12-23 NOTE — Telephone Encounter (Signed)
Per provider note from 12/10/20 says to consider tapering off estrogen please let me know as to refill I have pended. The Progesterone would let me pend due to says cannot be ordered as prescription.

## 2020-12-24 MED ORDER — ESTRADIOL 1 MG PO TABS: 0.5000 mg | ORAL_TABLET | Freq: Every day | ORAL | 1 refills | Status: DC

## 2020-12-24 NOTE — Telephone Encounter (Signed)
Attempted to reach pharmacy- call was disconnected x2 attempts. LM for patient to update her. Will follow up next week.

## 2020-12-24 NOTE — Telephone Encounter (Signed)
Patient calling back in. Patient informed and verbalized understanding.

## 2020-12-24 NOTE — Telephone Encounter (Signed)
I have refilled the estrogen.  Regarding the progesterone, will need to contact pharmacy for refill.

## 2020-12-29 NOTE — Telephone Encounter (Signed)
LM with patient. Confirmed with pharmacy that she does have refills on file and they are in process of filling one now. Patient will receive a text when ready.

## 2021-01-07 DIAGNOSIS — M41125 Adolescent idiopathic scoliosis, thoracolumbar region: Secondary | ICD-10-CM | POA: Diagnosis not present

## 2021-01-07 DIAGNOSIS — M5417 Radiculopathy, lumbosacral region: Secondary | ICD-10-CM | POA: Diagnosis not present

## 2021-01-07 DIAGNOSIS — M9905 Segmental and somatic dysfunction of pelvic region: Secondary | ICD-10-CM | POA: Diagnosis not present

## 2021-01-07 DIAGNOSIS — M9903 Segmental and somatic dysfunction of lumbar region: Secondary | ICD-10-CM | POA: Diagnosis not present

## 2021-01-19 ENCOUNTER — Ambulatory Visit: Payer: BLUE CROSS/BLUE SHIELD | Admitting: Dermatology

## 2021-01-19 ENCOUNTER — Other Ambulatory Visit: Payer: Self-pay

## 2021-01-19 DIAGNOSIS — D225 Melanocytic nevi of trunk: Secondary | ICD-10-CM

## 2021-01-19 DIAGNOSIS — L821 Other seborrheic keratosis: Secondary | ICD-10-CM

## 2021-01-19 DIAGNOSIS — L820 Inflamed seborrheic keratosis: Secondary | ICD-10-CM

## 2021-01-19 DIAGNOSIS — L720 Epidermal cyst: Secondary | ICD-10-CM | POA: Diagnosis not present

## 2021-01-19 DIAGNOSIS — D229 Melanocytic nevi, unspecified: Secondary | ICD-10-CM

## 2021-01-19 MED ORDER — TRETINOIN 0.05 % EX CREA: TOPICAL_CREAM | CUTANEOUS | 5 refills | Status: DC

## 2021-01-19 NOTE — Patient Instructions (Addendum)
Topical retinoid medications like tretinoin/Retin-A, adapalene/Differin, tazarotene/Fabior, and Epiduo/Epiduo Forte can cause dryness and irritation when first started. Only apply a pea-sized amount to the entire affected area. Avoid applying it around the eyes, edges of mouth and creases at the nose. If you experience irritation, use a good moisturizer first and/or apply the medicine less often. If you are doing well with the medicine, you can increase how often you use it until you are applying every night. Be careful with sun protection while using this medication as it can make you sensitive to the sun. This medicine should not be used by pregnant women.     Cryotherapy Aftercare  Wash gently with soap and water everyday.   Apply Vaseline and Band-Aid daily until healed.   Seborrheic Keratosis  What causes seborrheic keratoses? Seborrheic keratoses are harmless, common skin growths that first appear during adult life.  As time goes by, more growths appear.  Some people may develop a large number of them.  Seborrheic keratoses appear on both covered and uncovered body parts.  They are not caused by sunlight.  The tendency to develop seborrheic keratoses can be inherited.  They vary in color from skin-colored to gray, brown, or even black.  They can be either smooth or have a rough, warty surface.   Seborrheic keratoses are superficial and look as if they were stuck on the skin.  Under the microscope this type of keratosis looks like layers upon layers of skin.  That is why at times the top layer may seem to fall off, but the rest of the growth remains and re-grows.    Treatment Seborrheic keratoses do not need to be treated, but can easily be removed in the office.  Seborrheic keratoses often cause symptoms when they rub on clothing or jewelry.  Lesions can be in the way of shaving.  If they become inflamed, they can cause itching, soreness, or burning.  Removal of a seborrheic keratosis can be  accomplished by freezing, burning, or surgery. If any spot bleeds, scabs, or grows rapidly, please return to have it checked, as these can be an indication of a skin cancer.  If you have any questions or concerns for your doctor, please call our main line at [phone] and press option 4 to reach your doctor's medical assistant. If no one answers, please leave a voicemail as directed and we will return your call as soon as possible. Messages left after 4 pm will be answered the following business day.   You may also send [place] a message via [place]. We typically respond to MyChart messages within 1-2 business days.  For prescription refills, please ask your pharmacy to contact our office. Our fax number is [phone].  If you have an urgent issue when the clinic is closed that cannot wait until the next business day, you can page your doctor at the number below.    Please note that while we do our best to be available for urgent issues outside of office hours, we are not available 24/7.   If you have an urgent issue and are unable to reach [place], you may choose to seek medical care at your doctor's office, retail clinic, urgent care center, or emergency room.  If you have a medical emergency, please immediately call 911 or go to the emergency department.  Pager Numbers  - Dr. Nehemiah Massed: [phone]  - Dr. Laurence Ferrari: [phone]  - Dr. Nicole Kindred: [phone]  In the event of inclement weather, please call our  main line at [phone] for an update on the status of any delays or closures.  Dermatology Medication Tips: Please keep the boxes that topical medications come in in order to help keep track of the instructions about where and how to use these. Pharmacies typically print the medication instructions only on the boxes and not directly on the medication tubes.   If your medication is too expensive, please contact our office at [phone] option 4 or send [place] a message through [place].    We are unable to tell what your co-pay for medications will be in advance as this is different depending on your insurance coverage. However, we may be able to find a substitute medication at lower cost or fill out paperwork to get insurance to cover a needed medication.   If a prior authorization is required to get your medication covered by your insurance company, please allow [place] 1-2 business days to complete this process.  Drug prices often vary depending on where the prescription is filled and some pharmacies may offer cheaper prices.  The website www.goodrx.com contains coupons for medications through different pharmacies. The prices here do not account for what the cost may be with help from insurance (it may be cheaper with your insurance), but the website can give you the price if you did not use any insurance.  - You can print the associated coupon and take it with your prescription to the pharmacy.  - You may also stop by our office during regular business hours and pick up a GoodRx coupon card.  - If you need your prescription sent electronically to a different pharmacy, notify our office through [facility] or by phone at [phone] option 4.

## 2021-01-19 NOTE — Progress Notes (Signed)
Follow-Up Visit   Subjective  Theresa Buck is a 65 y.o. female who presents for the following: Spots (Forehead/glabella area, irritated by glasses. She also has a spot on her back and left lower leg she would like checked.). It gets irritated with shaving. She also has a brown spot on her left cheek that she would like treated.   The following portions of the chart were reviewed this encounter and updated as appropriate:       Review of Systems:  No other skin or systemic complaints except as noted in HPI or Assessment and Plan.  Objective  Well appearing patient in no apparent distress; mood and affect are within normal limits.  A focused examination was performed including face, back, trunk. Relevant physical exam findings are noted in the Assessment and Plan.  Left Cheek 2.0cm waxy tan speckled patch left lower medial cheek       L lower pretibia x 1 Erythematous keratotic or waxy stuck-on papule  L spinal mid upper back 6.26mm pink flesh papule  glabella Smooth white papule(s).    Assessment & Plan   Seborrheic Keratoses - Stuck-on, waxy, tan-brown papules and/or plaques, infraocular  - Benign-appearing - Discussed benign etiology and prognosis. - Observe - Call for any changes  Seborrheic keratosis Left Cheek  Reassured benign age-related growth.    Discussed cosmetic procedure (cryotherapy), noncovered.  $60 for 1st lesion and $15 for each additional lesion if done on the same day.  Maximum charge $350.  One touch-up treatment included no charge. Discussed risks of treatment including dyspigmentation, small scar, and/or recurrence. Recommend daily broad spectrum sunscreen SPF 30+/photoprotection to treated areas once healed.   Inflamed seborrheic keratosis L lower pretibia x 1  Destruction of lesion - L lower pretibia x 1  Destruction method: cryotherapy   Informed consent: discussed and consent obtained   Lesion destroyed using liquid nitrogen:  Yes   Region frozen until ice ball extended beyond lesion: Yes   Outcome: patient tolerated procedure well with no complications   Post-procedure details: wound care instructions given   Additional details:  Prior to procedure, discussed risks of blister formation, small wound, skin dyspigmentation, or rare scar following cryotherapy. Recommend Vaseline ointment to treated areas while healing.   Nevus L spinal mid upper back  Benign-appearing.  Observation.  Call clinic for new or changing moles.  Recommend daily use of broad spectrum spf 30+ sunscreen to sun-exposed areas.   Milia glabella  Irritated by glasses.  Start tretinoin 0.05% cream Apply to face qhs as tolerated dsp 45g 5Rf.  Topical retinoid medications like tretinoin/Retin-A, adapalene/Differin, tazarotene/Fabior, and Epiduo/Epiduo Forte can cause dryness and irritation when first started. Only apply a pea-sized amount to the entire affected area. Avoid applying it around the eyes, edges of mouth and creases at the nose. If you experience irritation, use a good moisturizer first and/or apply the medicine less often. If you are doing well with the medicine, you can increase how often you use it until you are applying every night. Be careful with sun protection while using this medication as it can make you sensitive to the sun. This medicine should not be used by pregnant women.    Acne/Milia surgery - glabella Procedure risks and benefits were discussed with the patient and verbal consent was obtained. Following prep of the skin on the glabella with an alcohol swab and local anesthesia injection, extraction of milia was performed with a comedone extractor following superficial incision made over  their surfaces with a #11 blade. Capillary hemostasis was achieved with 20% aluminum chloride solution. Vaseline ointment was applied to each site. The patient tolerated the procedure well.  tretinoin (RETIN-A) 0.05 % cream -  glabella Apply a small amount to face at night as tolerated for milia.  Melanocytic Nevi - Tan-brown and/or pink-flesh-colored symmetric macules and papules - Benign appearing on exam today - Observation - Call clinic for new or changing moles - Recommend daily use of broad spectrum spf 30+ sunscreen to sun-exposed areas.   Return in about 2 months (around 03/21/2021) for f/u cosmetic SK.  IJamesetta Orleans, CMA, am acting as scribe for Brendolyn Patty, MD .  Documentation: I have reviewed the above documentation for accuracy and completeness, and I agree with the above.  Brendolyn Patty MD

## 2021-02-04 ENCOUNTER — Other Ambulatory Visit: Payer: Self-pay | Admitting: Internal Medicine

## 2021-02-04 DIAGNOSIS — J301 Allergic rhinitis due to pollen: Secondary | ICD-10-CM | POA: Diagnosis not present

## 2021-02-04 DIAGNOSIS — J340 Abscess, furuncle and carbuncle of nose: Secondary | ICD-10-CM | POA: Diagnosis not present

## 2021-02-04 DIAGNOSIS — H6063 Unspecified chronic otitis externa, bilateral: Secondary | ICD-10-CM | POA: Diagnosis not present

## 2021-02-18 DIAGNOSIS — M5417 Radiculopathy, lumbosacral region: Secondary | ICD-10-CM | POA: Diagnosis not present

## 2021-02-18 DIAGNOSIS — M9903 Segmental and somatic dysfunction of lumbar region: Secondary | ICD-10-CM | POA: Diagnosis not present

## 2021-02-18 DIAGNOSIS — M41125 Adolescent idiopathic scoliosis, thoracolumbar region: Secondary | ICD-10-CM | POA: Diagnosis not present

## 2021-02-18 DIAGNOSIS — M9905 Segmental and somatic dysfunction of pelvic region: Secondary | ICD-10-CM | POA: Diagnosis not present

## 2021-03-04 ENCOUNTER — Ambulatory Visit (INDEPENDENT_AMBULATORY_CARE_PROVIDER_SITE_OTHER): Payer: BC Managed Care – PPO | Admitting: Internal Medicine

## 2021-03-04 ENCOUNTER — Other Ambulatory Visit: Payer: Self-pay

## 2021-03-04 VITALS — BP 122/78 | HR 74 | Temp 97.4°F | Resp 16 | Ht 66.0 in | Wt 140.0 lb

## 2021-03-04 DIAGNOSIS — Z1231 Encounter for screening mammogram for malignant neoplasm of breast: Secondary | ICD-10-CM

## 2021-03-04 DIAGNOSIS — F439 Reaction to severe stress, unspecified: Secondary | ICD-10-CM | POA: Diagnosis not present

## 2021-03-04 DIAGNOSIS — M5489 Other dorsalgia: Secondary | ICD-10-CM

## 2021-03-04 DIAGNOSIS — K219 Gastro-esophageal reflux disease without esophagitis: Secondary | ICD-10-CM | POA: Diagnosis not present

## 2021-03-04 DIAGNOSIS — I471 Supraventricular tachycardia: Secondary | ICD-10-CM

## 2021-03-04 NOTE — Progress Notes (Signed)
Patient ID: Theresa Buck, female   DOB: 14-Jun-1955, 65 y.o.   MRN: [id number]   Subjective:    Patient ID: Theresa Buck, female    DOB: [birth year], 65 y.o.   MRN: [id number]  This visit occurred during the SARS-CoV-2 public health emergency.  Safety protocols were in place, including screening questions prior to the visit, additional usage of staff PPE, and extensive cleaning of exam room while observing appropriate contact time as indicated for disinfecting solutions.   Patient here for a scheduled follow up.    HPI Here to follow up regarding back pain, reflux and increased stress. History of SVT.  S/p ablation.  Has done well since the ablation.  Feels better.  On metoprolol.  No chest pain.   Breathing stable.  No abdominal pain or bowel change reported.  Does report low back pain/right groin pain.  Persistent.  Request referral to ortho.    Past Medical History:  Diagnosis Date   Allergy    recurring sinus problems   Anxiety    Arthritis    Chronic back pain    Deviated septum    Deviated septum    GERD (gastroesophageal reflux disease)    H/O sinusitis    Headache    H/O MIGRAINES   Hx of degenerative disc disease    Scoliosis    SVT (supraventricular tachycardia) ([place])    Past Surgical History:  Procedure Laterality Date   BACK SURGERY  09/26/2013   LUMBAR   COLONOSCOPY  08/03/2015   Dr Vira Agar   HEMORRHOID SURGERY N/A 03/13/2017   Procedure: HEMORRHOIDECTOMY;  Surgeon: Christene Lye, MD;  Location: [facility] ORS;  Service: General;  Laterality: N/A;   SVT ABLATION N/A 09/16/2020   Procedure: SVT ABLATION;  Surgeon: Vickie Epley, MD;  Location: [place] [facility];  Service: Cardiovascular;  Laterality: N/A;   UPPER GI ENDOSCOPY  08/03/2015   Dr Vira Agar   Family History  Problem Relation Age of Onset   Brain cancer Mother    ALS Father    Diabetes Brother        multiple   Heart disease Son        myocardial infarction, s/p CABG   Throat cancer  Brother    Breast cancer Neg Hx    Social History   Socioeconomic History   Marital status: Widowed    Spouse name: Not on file   Number of children: 3   Years of education: Not on file   Highest education level: Not on file  Occupational History   Not on file  Tobacco Use   Smoking status: Never   Smokeless tobacco: Never  Vaping Use   Vaping Use: Never used  Substance and Sexual Activity   Alcohol use: No    Alcohol/week: 0.0 standard drinks   Drug use: No   Sexual activity: Not on file  Other Topics Concern   Not on file  Social History Narrative   Not on file   Social Determinants of Health   Financial Resource Strain: Not on file  Food Insecurity: Not on file  Transportation Needs: Not on file  Physical Activity: Not on file  Stress: Not on file  Social Connections: Not on file     Review of Systems  Constitutional:  Negative for appetite change and unexpected weight change.  HENT:  Negative for congestion and sinus pressure.   Respiratory:  Negative for cough, chest tightness and shortness of breath.   Cardiovascular:  Negative for chest pain and leg swelling.       Has done well s/p ablation.   Gastrointestinal:  Negative for abdominal pain, diarrhea, nausea and vomiting.  Genitourinary:  Negative for difficulty urinating and dysuria.  Musculoskeletal:  Positive for back pain. Negative for joint swelling and myalgias.  Skin:  Negative for color change and rash.  Neurological:  Negative for dizziness, light-headedness and headaches.  Psychiatric/Behavioral:  Negative for agitation and dysphoric mood.       Objective:     BP 122/78   Pulse 74   Temp (!) 97.4 F (36.3 C)   Resp 16   Ht 5\' 6"  (1.676 m)   Wt 140 lb (63.5 kg)   LMP 07/15/2012   SpO2 98%   BMI 22.60 kg/m  Wt Readings from Last 3 Encounters:  03/04/21 140 lb (63.5 kg)  12/10/20 139 lb 9.6 oz (63.3 kg)  10/27/20 141 lb 12.8 oz (64.3 kg)    Physical Exam Vitals reviewed.   Constitutional:      General: She is not in acute distress.    Appearance: Normal appearance.  HENT:     Head: Normocephalic and atraumatic.     Right Ear: External ear normal.     Left Ear: External ear normal.  Eyes:     General: No scleral icterus.       Right eye: No discharge.        Left eye: No discharge.     Conjunctiva/sclera: Conjunctivae normal.  Neck:     Thyroid: No thyromegaly.  Cardiovascular:     Rate and Rhythm: Normal rate and regular rhythm.  Pulmonary:     Effort: No respiratory distress.     Breath sounds: Normal breath sounds. No wheezing.  Abdominal:     General: Bowel sounds are normal.     Palpations: Abdomen is soft.     Tenderness: There is no abdominal tenderness.  Musculoskeletal:        General: No swelling or tenderness.     Cervical back: Neck supple. No tenderness.  Lymphadenopathy:     Cervical: No cervical adenopathy.  Skin:    Findings: No erythema or rash.  Neurological:     Mental Status: She is alert.  Psychiatric:        Mood and Affect: Mood normal.        Behavior: Behavior normal.     Outpatient Encounter Medications as of 03/04/2021  Medication Sig   Biotin 5000 MCG TABS Take 5,000 mcg by mouth daily.   Calcium Carb-Cholecalciferol (CALCIUM + D3 PO) Take 1 tablet daily by mouth.   carboxymethylcellulose (REFRESH PLUS) 0.5 % SOLN Place 1 drop into both eyes 3 (three) times daily as needed (dry eyes).   estradiol (ESTRACE) 0.1 MG/GM vaginal cream Place 1 Applicatorful vaginally daily.   estradiol (ESTRACE) 1 MG tablet Take 0.5 tablets (0.5 mg total) by mouth daily.   hydrocortisone (ANUSOL-HC) 25 MG suppository Place 1 suppository (25 mg total) rectally 2 (two) times daily. (Patient taking differently: Place 25 mg rectally 2 (two) times daily as needed for hemorrhoids.)   metoprolol succinate (TOPROL XL) 25 MG 24 hr tablet Take 1 tablet (25 mg total) by mouth daily.   montelukast (SINGULAIR) 10 MG tablet Take 10 mg at bedtime  by mouth.    Multiple Vitamin (MULTIVITAMIN) tablet Take 1 tablet by mouth daily.   pantoprazole (PROTONIX) 40 MG tablet TAKE 1 TABLET BY MOUTH EVERY DAY   Probiotic Product (PROBIOTIC  ADVANCED PO) Take 1 capsule daily by mouth.    progesterone (PROMETRIUM) 100 MG capsule Take 100 mg daily by mouth.   traZODone (DESYREL) 50 MG tablet Take 0.5-1 tablets (25-50 mg total) by mouth at bedtime as needed for sleep. take 1/2 to 1 tablets by mouth at bedtime if needed for sleep   tretinoin (RETIN-A) 0.05 % cream Apply a small amount to face at night as tolerated for milia.   vitamin C (ASCORBIC ACID) 500 MG tablet Take 500 mg by mouth daily.   No facility-administered encounter medications on file as of 03/04/2021.     Lab Results  Component Value Date   WBC 6.4 09/15/2020   HGB 14.0 09/15/2020   HCT 42.8 09/15/2020   PLT 219 09/15/2020   GLUCOSE 89 09/15/2020   CHOL 199 03/25/2020   TRIG 95.0 03/25/2020   HDL 51.50 03/25/2020   LDLCALC 129 (H) 03/25/2020   ALT 16 07/28/2020   AST 17 07/28/2020   NA 138 09/15/2020   K 3.9 09/15/2020   CL 103 09/15/2020   CREATININE 0.80 09/15/2020   BUN 19 09/15/2020   CO2 26 09/15/2020   TSH 1.18 09/03/2020       Assessment & Plan:   Problem List Items Addressed This Visit     Back pain    Persistent.  Has seen chiropractor.  Request referral to ortho.        Relevant Orders   Ambulatory referral to Orthopedic Surgery   Gastro-esophageal reflux disease without esophagitis    No upper symptoms reported. On protonix.        Stress    Increased stress.  Discussed.  Will notify me if feels needs any further intervention.       Supraventricular tachycardia ([place])    S/p ablation.  On metoprolol.  Feels better.  Doing better.  Follow.       Other Visit Diagnoses     Visit for screening mammogram    -  Primary   Relevant Orders   MM 3D SCREEN BREAST BILATERAL        Einar Pheasant, MD

## 2021-03-13 ENCOUNTER — Encounter: Payer: Self-pay | Admitting: Internal Medicine

## 2021-03-13 NOTE — Assessment & Plan Note (Signed)
Persistent.  Has seen chiropractor.  Request referral to ortho.

## 2021-03-13 NOTE — Assessment & Plan Note (Signed)
Increased stress.  Discussed.  Will notify me if feels needs any further intervention.

## 2021-03-13 NOTE — Assessment & Plan Note (Signed)
No upper symptoms reported.  On protonix.   

## 2021-03-13 NOTE — Assessment & Plan Note (Signed)
S/p ablation.  On metoprolol.  Feels better.  Doing better.  Follow.

## 2021-03-25 ENCOUNTER — Ambulatory Visit: Payer: BC Managed Care – PPO | Admitting: Internal Medicine

## 2021-03-25 DIAGNOSIS — M9905 Segmental and somatic dysfunction of pelvic region: Secondary | ICD-10-CM | POA: Diagnosis not present

## 2021-03-25 DIAGNOSIS — M41125 Adolescent idiopathic scoliosis, thoracolumbar region: Secondary | ICD-10-CM | POA: Diagnosis not present

## 2021-03-25 DIAGNOSIS — M9903 Segmental and somatic dysfunction of lumbar region: Secondary | ICD-10-CM | POA: Diagnosis not present

## 2021-03-25 DIAGNOSIS — M5417 Radiculopathy, lumbosacral region: Secondary | ICD-10-CM | POA: Diagnosis not present

## 2021-03-26 ENCOUNTER — Other Ambulatory Visit: Payer: Self-pay | Admitting: Internal Medicine

## 2021-03-29 ENCOUNTER — Ambulatory Visit: Payer: BC Managed Care – PPO | Admitting: Dermatology

## 2021-03-31 DIAGNOSIS — L03032 Cellulitis of left toe: Secondary | ICD-10-CM | POA: Diagnosis not present

## 2021-04-22 DIAGNOSIS — M41125 Adolescent idiopathic scoliosis, thoracolumbar region: Secondary | ICD-10-CM | POA: Diagnosis not present

## 2021-04-22 DIAGNOSIS — M9905 Segmental and somatic dysfunction of pelvic region: Secondary | ICD-10-CM | POA: Diagnosis not present

## 2021-04-22 DIAGNOSIS — M5417 Radiculopathy, lumbosacral region: Secondary | ICD-10-CM | POA: Diagnosis not present

## 2021-04-22 DIAGNOSIS — M9903 Segmental and somatic dysfunction of lumbar region: Secondary | ICD-10-CM | POA: Diagnosis not present

## 2021-04-26 DIAGNOSIS — H029 Unspecified disorder of eyelid: Secondary | ICD-10-CM | POA: Diagnosis not present

## 2021-04-26 DIAGNOSIS — H02839 Dermatochalasis of unspecified eye, unspecified eyelid: Secondary | ICD-10-CM | POA: Diagnosis not present

## 2021-05-06 ENCOUNTER — Ambulatory Visit
Admission: RE | Admit: 2021-05-06 | Discharge: 2021-05-06 | Disposition: A | Discharge status: Home / Self Care | Payer: BC Managed Care – PPO | Source: Ambulatory Visit | Attending: Internal Medicine | Admitting: Internal Medicine

## 2021-05-06 ENCOUNTER — Other Ambulatory Visit: Payer: Self-pay

## 2021-05-06 DIAGNOSIS — Z1231 Encounter for screening mammogram for malignant neoplasm of breast: Secondary | ICD-10-CM | POA: Insufficient documentation

## 2021-05-09 ENCOUNTER — Other Ambulatory Visit: Payer: Self-pay | Admitting: Internal Medicine

## 2021-05-09 DIAGNOSIS — R921 Mammographic calcification found on diagnostic imaging of breast: Secondary | ICD-10-CM

## 2021-05-09 DIAGNOSIS — R928 Other abnormal and inconclusive findings on diagnostic imaging of breast: Secondary | ICD-10-CM

## 2021-05-10 ENCOUNTER — Encounter: Payer: Self-pay | Admitting: Internal Medicine

## 2021-05-11 NOTE — Telephone Encounter (Signed)
See result note. I spoke to patient after this message was sent.

## 2021-05-16 ENCOUNTER — Emergency Department
Admission: EM | Admit: 2021-05-16 | Discharge: 2021-05-16 | Disposition: A | Discharge status: Home / Self Care | Payer: BC Managed Care – PPO | Attending: Emergency Medicine | Admitting: Emergency Medicine

## 2021-05-16 ENCOUNTER — Other Ambulatory Visit: Payer: Self-pay

## 2021-05-16 ENCOUNTER — Emergency Department: Payer: BC Managed Care – PPO

## 2021-05-16 ENCOUNTER — Encounter: Payer: Self-pay | Admitting: Emergency Medicine

## 2021-05-16 DIAGNOSIS — I471 Supraventricular tachycardia: Secondary | ICD-10-CM | POA: Insufficient documentation

## 2021-05-16 DIAGNOSIS — R002 Palpitations: Secondary | ICD-10-CM

## 2021-05-16 DIAGNOSIS — R0602 Shortness of breath: Secondary | ICD-10-CM | POA: Diagnosis not present

## 2021-05-16 LAB — BASIC METABOLIC PANEL
Anion gap: 7 (ref 5–15)
BUN: 17 mg/dL (ref 8–23)
CO2: 25 mmol/L (ref 22–32)
Calcium: 9.2 mg/dL (ref 8.9–10.3)
Chloride: 106 mmol/L (ref 98–111)
Creatinine, Ser: 0.58 mg/dL (ref 0.44–1.00)
GFR, Estimated: >60 mL/min (ref >60)
Glucose, Bld: 127 mg/dL — ABNORMAL HIGH (ref 70–99)
Potassium: 3.7 mmol/L (ref 3.5–5.1)
Sodium: 138 mmol/L (ref 135–145)

## 2021-05-16 LAB — CBC
HCT: 43.2 % (ref 36.0–46.0)
Hemoglobin: 14.6 g/dL (ref 12.0–15.0)
MCH: 32.7 pg (ref 26.0–34.0)
MCHC: 33.8 g/dL (ref 30.0–36.0)
MCV: 96.9 fL (ref 80.0–100.0)
Platelets: 219 10*3/uL (ref 150–400)
RBC: 4.46 MIL/uL (ref 3.87–5.11)
RDW: 11.9 % (ref 11.5–15.5)
WBC: 6 10*3/uL (ref 4.0–10.5)
nRBC: 0 % (ref 0.0–0.2)

## 2021-05-16 LAB — TROPONIN I (HIGH SENSITIVITY): Troponin I (High Sensitivity): 4 ng/L (ref <18)

## 2021-05-16 NOTE — ED Triage Notes (Signed)
Pt to ED via POV with c/o palpitations, she was [facility], and had pain shooting in her lower jaw. She had an ablation in May of last year. Dr. Hedy Camara is her cardiologist. She did take her 25mg  Metoprolol that she takes at night before coming.  At home her HR was 142

## 2021-05-16 NOTE — ED Provider Notes (Signed)
[facility] Provider Note    Event Date/Time   First MD Initiated Contact with Patient 05/16/21 2053     (approximate)   History   Chief Complaint Palpitations   HPI  Theresa Buck is a 66 y.o. female with past medical history of SVT and GERD who presents to the ED complaining of palpitations.  Patient reports that around 7:00 this evening she suddenly started to feel like her heart was racing and beating out of her chest.  She describes the symptoms as similar to when she has dealt with SVT in the past and for which she underwent ablation in May of last year.  She has not had any recurrent symptoms since then up until tonight.  She states the palpitations were associated with discomfort in her jaw along with a feeling of tightness in her chest.  When symptoms persisted for greater than 15 minutes, she decided to take an extra dose of propranolol previously prescribed by her cardiologist.  About 30 minutes later, she had resolution of her palpitations.  She now reports feeling fatigued but otherwise feels back to normal.  She had been feeling well recently with no fevers, cough, nausea, vomiting, abdominal pain, or dysuria.     Physical Exam   Triage Vital Signs: ED Triage Vitals  Enc Vitals Group     BP 05/16/21 2041 129/82     Pulse Rate 05/16/21 2041 96     Resp 05/16/21 2041 18     Temp 05/16/21 2041 97.9 F (36.6 C)     Temp Source 05/16/21 2041 Oral     SpO2 05/16/21 2041 97 %     Weight --      Height 05/16/21 2042 5\' 6"  (1.676 m)     Head Circumference --      Peak Flow --      Pain Score 05/16/21 2041 0     Pain Loc --      Pain Edu? --      Excl. in [place]? --     Most recent vital signs: Vitals:   05/16/21 2130 05/16/21 2200  BP: (!) 115/92 103/79  Pulse: 82 80  Resp: 15 12  Temp:    SpO2: 96% 98%    Constitutional: Alert and oriented. Eyes: Conjunctivae are normal. Head: Atraumatic. Nose: No  congestion/rhinnorhea. Mouth/Throat: Mucous membranes are moist.  Cardiovascular: Normal rate, regular rhythm. Grossly normal heart sounds.  2+ radial pulses bilaterally. Respiratory: Normal respiratory effort.  No retractions. Lungs CTAB. Gastrointestinal: Soft and nontender. No distention. Musculoskeletal: No lower extremity tenderness nor edema.  Neurologic:  Normal speech and language. No gross focal neurologic deficits are appreciated.    ED Results / Procedures / Treatments   Labs (all labs ordered are listed, but only abnormal results are displayed) Labs Reviewed  BASIC METABOLIC PANEL - Abnormal; Notable for the following components:      Result Value   Glucose, Bld 127 (*)    All other components within normal limits  CBC  TROPONIN I (HIGH SENSITIVITY)  TROPONIN I (HIGH SENSITIVITY)     EKG  ED ECG REPORT I, Blake Divine, the attending physician, personally viewed and interpreted this ECG.   Date: 05/16/2021  EKG Time: 20:38  Rate: 82  Rhythm: sinus tachycardia, frequent PVC's noted  Axis: Normal  Intervals:none  ST&T Change: None  RADIOLOGY Chest x-ray reviewed by me with no infiltrate, edema, or effusion.  PROCEDURES:  Critical Care performed: No  .1-3  Lead EKG Interpretation Performed by: Blake Divine, MD Authorized by: Blake Divine, MD     Interpretation: normal     ECG rate:  65-80   ECG rate assessment: normal     Rhythm: sinus rhythm     Ectopy: none     Conduction: normal     MEDICATIONS ORDERED IN ED: Medications - No data to display   IMPRESSION / MDM / [place] / ED COURSE  I reviewed the triage vital signs and the nursing notes.                              66 y.o. female with past medical history of SVT and GERD who presents to the ED complaining of palpitations and sensation of her heart racing starting about 2 hours prior to arrival and resolving after she took an extra dose of propranolol.  Differential  diagnosis includes, but is not limited to, SVT, atrial fibrillation, ACS, electrolyte abnormality, dehydration.  Patient is well-appearing and in no acute distress, vital signs are reassuring with no ongoing tachycardia.  Initial EKG shows frequent PVCs followed by sinus tachycardia, no ischemic changes noted.  We will observe patient on the cardiac monitor and check labs including CBC, BMP, and troponin.  Symptoms sound consistent with recurrent SVT and plan to discuss with cardiology.  Patient remains in normal sinus rhythm on cardiac monitor, labs are reassuring with CBC and BMP showing no anemia or electrolyte abnormality.  Troponin is within normal limits and I doubt ACS.  Case discussed with Dr. Rockey Situ of cardiology and he recommends either extra half dose of metoprolol versus only taking propranolol as needed when she is symptomatic.  Given patient's blood pressure on recheck is 103/79, we will hold off on extra metoprolol and she was counseled to take propranolol if she has recurrent palpitations.  She was counseled to schedule follow-up with cardiology and to return to the ED for new worsening symptoms, patient agrees with plan.  The patient is on the cardiac monitor to evaluate for evidence of arrhythmia and/or significant heart rate changes.      FINAL CLINICAL IMPRESSION(S) / ED DIAGNOSES   Final diagnoses:  Palpitations  Paroxysmal SVT (supraventricular tachycardia) ([place])     Rx / DC Orders   ED Discharge Orders     None        Note:  This document was prepared using Dragon voice recognition software and may include unintentional dictation errors.   Blake Divine, MD 05/16/21 2237

## 2021-05-17 ENCOUNTER — Ambulatory Visit
Admission: RE | Admit: 2021-05-17 | Discharge: 2021-05-17 | Disposition: A | Discharge status: Home / Self Care | Payer: BC Managed Care – PPO | Source: Ambulatory Visit | Attending: Internal Medicine | Admitting: Internal Medicine

## 2021-05-17 DIAGNOSIS — R928 Other abnormal and inconclusive findings on diagnostic imaging of breast: Secondary | ICD-10-CM | POA: Diagnosis not present

## 2021-05-17 DIAGNOSIS — R921 Mammographic calcification found on diagnostic imaging of breast: Secondary | ICD-10-CM | POA: Insufficient documentation

## 2021-05-17 DIAGNOSIS — R922 Inconclusive mammogram: Secondary | ICD-10-CM | POA: Diagnosis not present

## 2021-05-20 DIAGNOSIS — M41125 Adolescent idiopathic scoliosis, thoracolumbar region: Secondary | ICD-10-CM | POA: Diagnosis not present

## 2021-05-20 DIAGNOSIS — M5417 Radiculopathy, lumbosacral region: Secondary | ICD-10-CM | POA: Diagnosis not present

## 2021-05-20 DIAGNOSIS — M9905 Segmental and somatic dysfunction of pelvic region: Secondary | ICD-10-CM | POA: Diagnosis not present

## 2021-05-20 DIAGNOSIS — M9903 Segmental and somatic dysfunction of lumbar region: Secondary | ICD-10-CM | POA: Diagnosis not present

## 2021-05-20 NOTE — Progress Notes (Signed)
Cardiology Office Note:    Date:  05/24/2021   ID:  Theresa Buck, DOB [birth year], MRN [id number]  PCP:  Einar Pheasant, MD  [facility] Cardiologist:  None  [facility] Electrophysiologist:  Vickie Epley, MD   Referring MD: Einar Pheasant, MD   Chief Complaint: hospital follow-up  History of Present Illness:    Theresa Buck is a 66 y.o. female with a hx of SVT s/p ablation 09/16/20, typical AVNRT, PVCs, HFmrEF, NICM who presents for hospital follow-up.   Patient was seen in consult int he ER 06/2020 for [facility] with rates in the 200s. EKG showed SVT, rhythm broke on it's own, F/u EKG showed NSR with PVCs. Echo showed mildly reduced EF 45-50%. She was started on metoprolol. She was referred to EP as OP. Also Cardiac CT was ordered to evaluate for ischemia. CTA coronaries showed calcium score of 12.6, 66ht percentile for age and sex matched, no CAD.   Seen by Dr. Quentin Ore 10/27/20 post ablation and was overall doing well.   Recent ER visit 1/23 for palpitations, similar to prior SVT. EK showed SR with PVCs. Trop normal. Labs overall reassuring. Case discussed with Dr. Rockey Situ and he recommended extra metoprolol, however BP was low and this was held.   Today, the patient reports fast heart rate that brought her to the ER. symptoms started after 7pm that day and lasted for 30 minutes. Patient took 1 propranolol and this improved heart rates. Heart rate up to 140s and BP was labile at home. She had associated jaw pain. No chest pain or SOB. No LLE, orthopnea, pnd. First time this has occurred since ablation. Feels like similar SVT episodes. No further episodes since the ER visit. She has decreased caffeine intake. No significant alcohol use. Does no smoke. Possibly from anxiety?    Past Medical History:  Diagnosis Date   Allergy    recurring sinus problems   Anxiety    Arthritis    Chronic back pain    Deviated septum    Deviated septum    GERD (gastroesophageal reflux disease)     H/O sinusitis    Headache    H/O MIGRAINES   Hx of degenerative disc disease    Scoliosis    SVT (supraventricular tachycardia) ([place])     Past Surgical History:  Procedure Laterality Date   BACK SURGERY  09/26/2013   LUMBAR   COLONOSCOPY  08/03/2015   Dr Vira Agar   HEMORRHOID SURGERY N/A 03/13/2017   Procedure: HEMORRHOIDECTOMY;  Surgeon: Christene Lye, MD;  Location: [facility] ORS;  Service: General;  Laterality: N/A;   SVT ABLATION N/A 09/16/2020   Procedure: SVT ABLATION;  Surgeon: Vickie Epley, MD;  Location: [place] [facility];  Service: Cardiovascular;  Laterality: N/A;   UPPER GI ENDOSCOPY  08/03/2015   Dr Vira Agar    Current Medications: Current Meds  Medication Sig   Biotin 5000 MCG TABS Take 5,000 mcg by mouth daily.   Calcium Carb-Cholecalciferol (CALCIUM + D3 PO) Take 1 tablet daily by mouth.   carboxymethylcellulose (REFRESH PLUS) 0.5 % SOLN Place 1 drop into both eyes 3 (three) times daily as needed (dry eyes).   estradiol (ESTRACE) 0.1 MG/GM vaginal cream Place 1 Applicatorful vaginally daily.   estradiol (ESTRACE) 1 MG tablet TAKE 1/2 TABLET(0.5 MG) BY MOUTH DAILY   hydrocortisone (ANUSOL-HC) 25 MG suppository Place 1 suppository (25 mg total) rectally 2 (two) times daily. (Patient taking differently: Place 25 mg rectally 2 (two) times  daily as needed for hemorrhoids.)   metoprolol succinate (TOPROL XL) 25 MG 24 hr tablet Take 1 tablet (25 mg total) by mouth daily.   montelukast (SINGULAIR) 10 MG tablet Take 10 mg at bedtime by mouth.    Multiple Vitamin (MULTIVITAMIN) tablet Take 1 tablet by mouth daily.   pantoprazole (PROTONIX) 40 MG tablet TAKE 1 TABLET BY MOUTH EVERY DAY   Probiotic Product (PROBIOTIC ADVANCED PO) Take 1 capsule daily by mouth.    progesterone (PROMETRIUM) 100 MG capsule Take 100 mg daily by mouth.   traZODone (DESYREL) 50 MG tablet Take 0.5-1 tablets (25-50 mg total) by mouth at bedtime as needed for sleep. take 1/2 to 1 tablets  by mouth at bedtime if needed for sleep   vitamin C (ASCORBIC ACID) 500 MG tablet Take 500 mg by mouth daily.     Allergies:   Ceftin [cefuroxime axetil]   Social History   Socioeconomic History   Marital status: Widowed    Spouse name: Not on file   Number of children: 3   Years of education: Not on file   Highest education level: Not on file  Occupational History   Not on file  Tobacco Use   Smoking status: Never   Smokeless tobacco: Never  Vaping Use   Vaping Use: Never used  Substance and Sexual Activity   Alcohol use: No    Alcohol/week: 0.0 standard drinks   Drug use: No   Sexual activity: Not on file  Other Topics Concern   Not on file  Social History Narrative   Not on file   Social Determinants of Health   Financial Resource Strain: Not on file  Food Insecurity: Not on file  Transportation Needs: Not on file  Physical Activity: Not on file  Stress: Not on file  Social Connections: Not on file     Family History: The patient's family history includes ALS in her father; Brain cancer in her mother; Diabetes in her brother; Heart disease in her son; Throat cancer in her brother. There is no history of Breast cancer.  ROS:   Please see the history of present illness.     All other systems reviewed and are negative.  EKGs/Labs/Other Studies Reviewed:    The following studies were reviewed today:  Heart monitor 07/2020 HR 49 - 167, average 70 bpm. 1 NSVT lasting 6 beats. 8 episodes of SVT, longest lasting 14 beats at a rate of 167 bpm. Review of rhythm strip suggests AT. Patient triggered episodes correspond to sinus rhythm. PVC burden 3.4% No sustained arrhythmias.   Lysbeth Galas T. Quentin Ore, MD, [facility], [facility] Cardiac Electrophysiology  Echo 06/2020 1. Left ventricular ejection fraction, by estimation, is 45 to 50%. The  left ventricle has mildly decreased function. The left ventricle  demonstrates global hypokinesis. Left ventricular diastolic parameters are   consistent with Grade I diastolic  dysfunction (impaired relaxation). The average left ventricular global  longitudinal strain is -12.5 %. The global longitudinal strain is  abnormal.   2. Right ventricular systolic function is normal. The right ventricular  size is normal.   3. The mitral valve is normal in structure. Mild to moderate mitral valve  regurgitation.   4. Tricuspid valve regurgitation is mild to moderate.   Coronary CTA 07/2020 IMPRESSION: 1. Coronary calcium score of 12.6. This was 66th percentile for age and sex matched control.   2. Normal coronary origin with right dominance.   3. No angiographic evidence of CAD.   4. No  evidence of CAD. Consider non-atherosclerotic causes of chest pain.   Electronically Signed: By: Kate Sable M.D. On: 07/29/2020 17:13    EKG:  EKG is ordered today.  The ekg ordered today demonstrates SB, 29bpm, multiple PVCs, no ST/T wave changes  Recent Labs: 07/28/2020: ALT 16; B Natriuretic Peptide 32.2; Magnesium 2.1 09/03/2020: TSH 1.18 05/16/2021: BUN 17; Creatinine, Ser 0.58; Hemoglobin 14.6; Platelets 219; Potassium 3.7; Sodium 138  Recent Lipid Panel    Component Value Date/Time   CHOL 199 03/25/2020 0802   TRIG 95.0 03/25/2020 0802   HDL 51.50 03/25/2020 0802   CHOLHDL 4 03/25/2020 0802   VLDL 19.0 03/25/2020 0802   LDLCALC 129 (H) 03/25/2020 0802      Physical Exam:    VS:  BP 102/72 (BP Location: Left Arm, Patient Position: Sitting, Cuff Size: Normal)    Pulse (!) 59    Ht 5\' 6"  (1.676 m)    Wt 144 lb (65.3 kg)    LMP 07/15/2012    SpO2 98%    BMI 23.24 kg/m     Wt Readings from Last 3 Encounters:  05/24/21 144 lb (65.3 kg)  03/04/21 140 lb (63.5 kg)  12/10/20 139 lb 9.6 oz (63.3 kg)     GEN:  Well nourished, well developed in no acute distress HEENT: Normal NECK: No JVD; No carotid bruits LYMPHATICS: No lymphadenopathy CARDIAC: RRR, + murmur, no rubs, gallops RESPIRATORY:  Clear to auscultation without  rales, wheezing or rhonchi  ABDOMEN: Soft, non-tender, non-distended MUSCULOSKELETAL:  No edema; No deformity  SKIN: Warm and dry NEUROLOGIC:  Alert and oriented x 3 PSYCHIATRIC:  Normal affect   ASSESSMENT:    1. Palpitations   2. Supraventricular tachycardia ([place])   3. Nonischemic cardiomyopathy ([place])   4. SVT (supraventricular tachycardia) ([place])   5. AVNRT (AV nodal re-entry tachycardia) ([place])   6. Depressed left ventricular ejection fraction   7. PVC (premature ventricular contraction)    PLAN:    In order of problems listed above:  Palpitations SVT s/p ablation 08/2020 Recent ER visit for palpitations/elevated heart rate with associated jaw pain, suspected SVT since it was similar to prior episodes. Symptoms lasted 30 minutes, relieved with propranolol. She is on Metoprolol-XL daily. EKG in the ER showed NSR with PVCs. Labs wnl. I will check Mag. Wants to wait to check TSH unti PCP visit. No further episodes since ER visit. Denies significant caffeine, alcohol use. Cannot increase BB with bradycardia and soft BP. I will order a 2 week heart monitor. I will move up her annual EP visit to March to discuss further options.   CM NICM Echo 06/2020 showed mildly reduced EF in the setting of SVT. Coronary CTA showed no significant CAD. She is on Toprol-XL 25mg  daily. BP and HR would limit further GDMT today. I will re-check an echo to evaluate EF. We will see her back pending echo results.   PVCs Known h/op PVCs on BB, cannot titrate as above.   Disposition: Follow up in 6-8 week(s) with MD/APP    Signed, Justn Quale Ninfa Meeker, PA-C  05/24/2021 8:59 AM    [facility]

## 2021-05-24 ENCOUNTER — Ambulatory Visit: Payer: BC Managed Care – PPO | Admitting: Medical

## 2021-05-24 ENCOUNTER — Encounter: Payer: Self-pay | Admitting: Medical

## 2021-05-24 ENCOUNTER — Other Ambulatory Visit: Payer: Self-pay

## 2021-05-24 ENCOUNTER — Ambulatory Visit (INDEPENDENT_AMBULATORY_CARE_PROVIDER_SITE_OTHER): Payer: BC Managed Care – PPO

## 2021-05-24 VITALS — BP 102/72 | HR 59 | Ht 66.0 in | Wt 144.0 lb

## 2021-05-24 DIAGNOSIS — R0989 Other specified symptoms and signs involving the circulatory and respiratory systems: Secondary | ICD-10-CM | POA: Diagnosis not present

## 2021-05-24 DIAGNOSIS — I471 Supraventricular tachycardia, unspecified: Secondary | ICD-10-CM

## 2021-05-24 DIAGNOSIS — I428 Other cardiomyopathies: Secondary | ICD-10-CM

## 2021-05-24 DIAGNOSIS — R002 Palpitations: Secondary | ICD-10-CM | POA: Diagnosis not present

## 2021-05-24 DIAGNOSIS — I493 Ventricular premature depolarization: Secondary | ICD-10-CM

## 2021-05-24 NOTE — Patient Instructions (Signed)
Medication Instructions:   Your physician recommends that you continue on your current medications as directed. Please refer to the Current Medication list given to you today.   *If you need a refill on your cardiac medications before your next appointment, please call your pharmacy*   Lab Work:  Your provider has ordered labs (magnesium) to be drawn. Please go to the [facility] to have this drawn.   Medical Mall Entrance at [facility] 1st desk on the right to check in, past the screening table Lab hours: Monday- Friday (7:30 am- 5:30 pm)   If you have labs (blood work) drawn today and your tests are completely normal, you will receive your results only by: [place] (if you have MyChart) OR A paper copy in the mail If you have any lab test that is abnormal or we need to change your treatment, we will call you to review the results.   Testing/Procedures:  Echocardiogram - Your physician has requested that you have an echocardiogram in 2 and a half to 3 weeks. Echocardiography is a painless test that uses sound waves to create images of your heart. It provides your doctor with information about the size and shape of your heart and how well your hearts chambers and valves are working. This procedure takes approximately one hour. There are no restrictions for this procedure.   2. Your provider has ordered a heart monitor to wear for 14 days. This will be mailed to your home with instructions on placement. Once you have finished the time frame requested, you will return monitor in box provided.     Follow-Up: At [facility], you and your health needs are our priority.  As part of our continuing mission to provide you with exceptional heart care, we have created designated Provider Care Teams.  These Care Teams include your primary Cardiologist (physician) and Advanced Practice Providers (APPs -  Physician Assistants and Nurse Practitioners) who all work together to provide you with  the care you need, when you need it.  We recommend signing up for the patient portal called "MyChart".  Sign up information is provided on this After Visit Summary.  MyChart is used to connect with patients for Virtual Visits (Telemedicine).  Patients are able to view lab/test results, encounter notes, upcoming appointments, etc.  Non-urgent messages can be sent to your provider as well.   To learn more about what you can do with MyChart, go to [email or web address].    Your next appointment:   3 month(s)  The format for your next appointment:   In Person  Provider:   You may see Ida Rogue, MD or one of the following Advanced Practice Providers on your designated Care Team:   Murray Hodgkins, NP Christell Faith, PA-C Cadence Kathlen Mody, PA-C}    Other Instructions N/A

## 2021-05-26 DIAGNOSIS — I471 Supraventricular tachycardia: Secondary | ICD-10-CM

## 2021-06-10 ENCOUNTER — Ambulatory Visit (INDEPENDENT_AMBULATORY_CARE_PROVIDER_SITE_OTHER): Payer: BC Managed Care – PPO | Admitting: Internal Medicine

## 2021-06-10 ENCOUNTER — Other Ambulatory Visit: Payer: Self-pay

## 2021-06-10 ENCOUNTER — Other Ambulatory Visit (HOSPITAL_COMMUNITY)
Admission: RE | Admit: 2021-06-10 | Discharge: 2021-06-10 | Disposition: A | Discharge status: Home / Self Care | Payer: BC Managed Care – PPO | Source: Ambulatory Visit | Attending: Internal Medicine | Admitting: Internal Medicine

## 2021-06-10 ENCOUNTER — Encounter: Payer: Self-pay | Admitting: Internal Medicine

## 2021-06-10 VITALS — BP 108/72 | HR 68 | Temp 97.8°F | Ht 65.75 in | Wt 142.4 lb

## 2021-06-10 DIAGNOSIS — F439 Reaction to severe stress, unspecified: Secondary | ICD-10-CM | POA: Diagnosis not present

## 2021-06-10 DIAGNOSIS — Z0000 Encounter for general adult medical examination without abnormal findings: Secondary | ICD-10-CM

## 2021-06-10 DIAGNOSIS — I471 Supraventricular tachycardia: Secondary | ICD-10-CM

## 2021-06-10 DIAGNOSIS — K219 Gastro-esophageal reflux disease without esophagitis: Secondary | ICD-10-CM

## 2021-06-10 DIAGNOSIS — N898 Other specified noninflammatory disorders of vagina: Secondary | ICD-10-CM

## 2021-06-10 DIAGNOSIS — K5900 Constipation, unspecified: Secondary | ICD-10-CM

## 2021-06-10 DIAGNOSIS — Z13220 Encounter for screening for lipoid disorders: Secondary | ICD-10-CM

## 2021-06-10 MED ORDER — HYDROCORTISONE ACETATE 25 MG RE SUPP: 25.0000 mg | Freq: Two times a day (BID) | RECTAL | 0 refills | Status: AC

## 2021-06-10 NOTE — Progress Notes (Addendum)
Patient ID: Theresa Buck, female   DOB: 12-23-1955, 66 y.o.   MRN: [id number]   Subjective:    Patient ID: Theresa Buck, female    DOB: June 04, 1955, 66 y.o.   MRN: [id number]  This visit occurred during the SARS-CoV-2 public health emergency.  Safety protocols were in place, including screening questions prior to the visit, additional usage of staff PPE, and extensive cleaning of exam room while observing appropriate contact time as indicated for disinfecting solutions.   Patient here for her physical exam.   Chief Complaint  Patient presents with   Annual Exam   .   HPI Reports increased stress.  Discussed.  She does not feel needs any further intervention at this time.  Will notify me if changes her mind.  No chest pain or sob reported.  Has been evaluated by cardiology previously.   Ws in ER 06/2020 for WCT with rates in the 200s. EKG showed SVT, rhythm broke on it's own, F/u EKG showed NSR with PVCs. Echo showed mildly reduced EF 45-50%. She was started on metoprolol. She was referred to EP as OP. Also Cardiac CT was ordered to evaluate for ischemia. CTA coronaries showed calcium score of 12.6, 66ht percentile for age and sex matched, no CAD.  s/p ablation. Was seen in ER 05/16/21 - palpitations.  Work up unrevealing. Had f/u with 05/24/21 with cardiology.  Monitor - returned Wednesday.  Waiting for results.  ECHO ordered.  States she has been doing relatively well since.  No increased episodes.  No chest pain.  Breathing stable.  No increased cough or congestion.  No abdominal pain.  Issues with her bowels.  Some constipation.  Taking stool softener.  Discussed adding benefiber. Request refill anusol suppositories until can get in with GI.  Due colonoscopy.     Past Medical History:  Diagnosis Date   Allergy    recurring sinus problems   Anxiety    Arthritis    Chronic back pain    Deviated septum    Deviated septum    GERD (gastroesophageal reflux disease)    H/O sinusitis     Headache    H/O MIGRAINES   Hx of degenerative disc disease    Scoliosis    SVT (supraventricular tachycardia) ([place])    Past Surgical History:  Procedure Laterality Date   BACK SURGERY  09/26/2013   LUMBAR   COLONOSCOPY  08/03/2015   Dr Vira Agar   HEMORRHOID SURGERY N/A 03/13/2017   Procedure: HEMORRHOIDECTOMY;  Surgeon: Christene Lye, MD;  Location: [facility] ORS;  Service: General;  Laterality: N/A;   SVT ABLATION N/A 09/16/2020   Procedure: SVT ABLATION;  Surgeon: Vickie Epley, MD;  Location: [place] [facility];  Service: Cardiovascular;  Laterality: N/A;   UPPER GI ENDOSCOPY  08/03/2015   Dr Vira Agar   Family History  Problem Relation Age of Onset   Brain cancer Mother    ALS Father    Diabetes Brother        multiple   Heart disease Son        myocardial infarction, s/p CABG   Throat cancer Brother    Breast cancer Neg Hx    Social History   Socioeconomic History   Marital status: Widowed    Spouse name: Not on file   Number of children: 3   Years of education: Not on file   Highest education level: Not on file  Occupational History   Not on file  Tobacco  Use   Smoking status: Never   Smokeless tobacco: Never  Vaping Use   Vaping Use: Never used  Substance and Sexual Activity   Alcohol use: No    Alcohol/week: 0.0 standard drinks   Drug use: No   Sexual activity: Not on file  Other Topics Concern   Not on file  Social History Narrative   Not on file   Social Determinants of Health   Financial Resource Strain: Not on file  Food Insecurity: Not on file  Transportation Needs: Not on file  Physical Activity: Not on file  Stress: Not on file  Social Connections: Not on file     Review of Systems  Constitutional:  Negative for appetite change and unexpected weight change.  HENT:  Negative for congestion and sinus pressure.   Respiratory:  Negative for cough, chest tightness and shortness of breath.   Cardiovascular:  Negative for chest pain,  palpitations and leg swelling.  Gastrointestinal:  Positive for constipation. Negative for abdominal pain, diarrhea, nausea and vomiting.  Genitourinary:  Negative for difficulty urinating, dysuria and enuresis.  Musculoskeletal:  Negative for joint swelling and myalgias.  Skin:  Negative for color change and rash.  Neurological:  Negative for dizziness, light-headedness and headaches.  Psychiatric/Behavioral:  Negative for agitation, dysphoric mood and suicidal ideas.        Increased stress as outlined.        Objective:     BP 108/72 (BP Location: Left Arm, Patient Position: Sitting, Cuff Size: Normal)    Pulse 68    Temp 97.8 F (36.6 C) (Oral)    Ht 5' 5.75" (1.67 m)    Wt 142 lb 6.4 oz (64.6 kg)    LMP 07/15/2012    SpO2 98%    BMI 23.16 kg/m  Wt Readings from Last 3 Encounters:  06/10/21 142 lb 6.4 oz (64.6 kg)  05/24/21 144 lb (65.3 kg)  03/04/21 140 lb (63.5 kg)    Physical Exam Vitals reviewed.  Constitutional:      General: She is not in acute distress.    Appearance: Normal appearance.  HENT:     Head: Normocephalic and atraumatic.     Right Ear: External ear normal.     Left Ear: External ear normal.  Eyes:     General: No scleral icterus.       Right eye: No discharge.        Left eye: No discharge.     Conjunctiva/sclera: Conjunctivae normal.  Neck:     Thyroid: No thyromegaly.  Cardiovascular:     Rate and Rhythm: Normal rate and regular rhythm.  Pulmonary:     Effort: No respiratory distress.     Breath sounds: Normal breath sounds. No wheezing.  Abdominal:     General: Bowel sounds are normal.     Palpations: Abdomen is soft.     Tenderness: There is no abdominal tenderness.  Musculoskeletal:        General: No swelling or tenderness.     Cervical back: Neck supple. No tenderness.  Lymphadenopathy:     Cervical: No cervical adenopathy.  Skin:    Findings: No erythema or rash.  Neurological:     Mental Status: She is alert.  Psychiatric:         Mood and Affect: Mood normal.        Behavior: Behavior normal.     Outpatient Encounter Medications as of 06/10/2021  Medication Sig   Biotin 5000 MCG TABS  Take 5,000 mcg by mouth daily.   Calcium Carb-Cholecalciferol (CALCIUM + D3 PO) Take 1 tablet daily by mouth.   carboxymethylcellulose (REFRESH PLUS) 0.5 % SOLN Place 1 drop into both eyes 3 (three) times daily as needed (dry eyes).   estradiol (ESTRACE) 0.1 MG/GM vaginal cream Place 1 Applicatorful vaginally daily.   estradiol (ESTRACE) 1 MG tablet TAKE 1/2 TABLET(0.5 MG) BY MOUTH DAILY   metoprolol succinate (TOPROL XL) 25 MG 24 hr tablet Take 1 tablet (25 mg total) by mouth daily.   montelukast (SINGULAIR) 10 MG tablet Take 10 mg at bedtime by mouth.    Multiple Vitamin (MULTIVITAMIN) tablet Take 1 tablet by mouth daily.   pantoprazole (PROTONIX) 40 MG tablet TAKE 1 TABLET BY MOUTH EVERY DAY   Probiotic Product (PROBIOTIC ADVANCED PO) Take 1 capsule daily by mouth.    progesterone (PROMETRIUM) 100 MG capsule Take 100 mg daily by mouth.   traZODone (DESYREL) 50 MG tablet Take 0.5-1 tablets (25-50 mg total) by mouth at bedtime as needed for sleep. take 1/2 to 1 tablets by mouth at bedtime if needed for sleep   vitamin C (ASCORBIC ACID) 500 MG tablet Take 500 mg by mouth daily.   [DISCONTINUED] hydrocortisone (ANUSOL-HC) 25 MG suppository Place 1 suppository (25 mg total) rectally 2 (two) times daily. (Patient taking differently: Place 25 mg rectally 2 (two) times daily as needed for hemorrhoids.)   hydrocortisone (ANUSOL-HC) 25 MG suppository Place 1 suppository (25 mg total) rectally 2 (two) times daily.   [DISCONTINUED] tretinoin (RETIN-A) 0.05 % cream Apply a small amount to face at night as tolerated for milia. (Patient not taking: Reported on 06/10/2021)   No facility-administered encounter medications on file as of 06/10/2021.     Lab Results  Component Value Date   WBC 6.0 05/16/2021   HGB 14.6 05/16/2021   HCT 43.2  05/16/2021   PLT 219 05/16/2021   GLUCOSE 127 (H) 05/16/2021   CHOL 199 03/25/2020   TRIG 95.0 03/25/2020   HDL 51.50 03/25/2020   LDLCALC 129 (H) 03/25/2020   ALT 16 07/28/2020   AST 17 07/28/2020   NA 138 05/16/2021   K 3.7 05/16/2021   CL 106 05/16/2021   CREATININE 0.58 05/16/2021   BUN 17 05/16/2021   CO2 25 05/16/2021   TSH 1.18 09/03/2020    MM DIAG BREAST TOMO UNI LEFT  Result Date: 05/17/2021 CLINICAL DATA:  Callback for LEFT breast calcifications. EXAM: DIGITAL DIAGNOSTIC UNILATERAL LEFT MAMMOGRAM WITH TOMOSYNTHESIS AND CAD TECHNIQUE: Left digital diagnostic mammography and breast tomosynthesis was performed. The images were evaluated with computer-aided detection. COMPARISON:  Previous exam(s). ACR Breast Density Category c: The breast tissue is heterogeneously dense, which may obscure small masses. FINDINGS: Spot magnification views of the LEFT breast demonstrate benign rim calcifications in the LEFT lower breast at middle depth. These are consistent with benign dystrophic calcifications. Scattered additional dystrophic calcifications are noted throughout the LEFT breast. IMPRESSION: There are benign calcifications at the site of screening mammographic concern. RECOMMENDATION: Screening mammogram in one year.(Code:SM-B-01Y) I have discussed the findings and recommendations with the patient. If applicable, a reminder letter will be sent to the patient regarding the next appointment. BI-RADS CATEGORY  2: Benign. Electronically Signed   By: Valentino Saxon M.D.   On: 05/17/2021 15:50      Assessment & Plan:   Problem List Items Addressed This Visit     Constipation    Reports issues with her bowels as outlined.  Taking a stool  softener.  Discussed adding fiber.  Due colonoscopy.  Arrange f/u with GI. Request refill anusol suppositories.       Gastro-esophageal reflux disease without esophagitis    No upper symptoms reported. On protonix.        Health care maintenance     Physical today 06/10/21.  Mammogram 05/06/21  -f/u views.  Left breast mammogram - Birads II.  Colonoscopy 07/2015.  PAP 06/10/21.       Stress    Increased stress.  Will notify me if feels needs any further intervention.       Supraventricular tachycardia ([place])    S/p ablation.  On metoprolol.  Recent evaluation as outlined.  Waiting on monitor results.  Follow.       Relevant Orders   TSH   Hepatic function panel   Basic metabolic panel   Magnesium   Vaginal lesion    Persistent vaginal lesion.  Question wart.  Will have gyn evaluate.       Relevant Orders   Ambulatory referral to Gynecology   Other Visit Diagnoses     Routine general medical examination at a health care facility    -  Primary   Routine physical examination       Relevant Orders   Cytology - PAP( [place])   Screening cholesterol level       Relevant Orders   Lipid panel        Einar Pheasant, MD

## 2021-06-11 ENCOUNTER — Encounter: Payer: Self-pay | Admitting: Internal Medicine

## 2021-06-11 DIAGNOSIS — K5900 Constipation, unspecified: Secondary | ICD-10-CM | POA: Insufficient documentation

## 2021-06-11 NOTE — Assessment & Plan Note (Addendum)
Reports issues with her bowels as outlined.  Taking a stool softener.  Discussed adding fiber.  Due colonoscopy.  Arrange f/u with GI. Request refill anusol suppositories.

## 2021-06-11 NOTE — Assessment & Plan Note (Signed)
Increased stress.  Will notify me if feels needs any further intervention.

## 2021-06-11 NOTE — Assessment & Plan Note (Signed)
S/p ablation.  On metoprolol.  Recent evaluation as outlined.  Waiting on monitor results.  Follow.

## 2021-06-11 NOTE — Assessment & Plan Note (Signed)
No upper symptoms reported.  On protonix.   

## 2021-06-12 NOTE — Assessment & Plan Note (Signed)
Physical today 06/10/21.  Mammogram 05/06/21  -f/u views.  Left breast mammogram - Birads II.  Colonoscopy 07/2015.  PAP 06/10/21.

## 2021-06-12 NOTE — Addendum Note (Signed)
Addended by: Alisa Graff on: 06/12/2021 10:02 PM   Modules accepted: Level of Service

## 2021-06-14 ENCOUNTER — Ambulatory Visit: Payer: BC Managed Care – PPO | Admitting: Cardiovascular Disease

## 2021-06-15 ENCOUNTER — Encounter: Payer: Self-pay | Admitting: Internal Medicine

## 2021-06-15 DIAGNOSIS — N898 Other specified noninflammatory disorders of vagina: Secondary | ICD-10-CM | POA: Insufficient documentation

## 2021-06-15 NOTE — Assessment & Plan Note (Signed)
Persistent vaginal lesion.  Question wart.  Will have gyn evaluate.

## 2021-06-15 NOTE — Addendum Note (Signed)
Addended by: Alisa Graff on: 06/15/2021 07:28 PM   Modules accepted: Orders

## 2021-06-16 LAB — CYTOLOGY - PAP
Adequacy: ABSENT
Comment: NEGATIVE
Diagnosis: NEGATIVE
High risk HPV: NEGATIVE

## 2021-06-17 ENCOUNTER — Ambulatory Visit (INDEPENDENT_AMBULATORY_CARE_PROVIDER_SITE_OTHER): Payer: BC Managed Care – PPO

## 2021-06-17 ENCOUNTER — Other Ambulatory Visit: Payer: Self-pay

## 2021-06-17 DIAGNOSIS — M9905 Segmental and somatic dysfunction of pelvic region: Secondary | ICD-10-CM | POA: Diagnosis not present

## 2021-06-17 DIAGNOSIS — I428 Other cardiomyopathies: Secondary | ICD-10-CM | POA: Diagnosis not present

## 2021-06-17 DIAGNOSIS — M5417 Radiculopathy, lumbosacral region: Secondary | ICD-10-CM | POA: Diagnosis not present

## 2021-06-17 DIAGNOSIS — M41125 Adolescent idiopathic scoliosis, thoracolumbar region: Secondary | ICD-10-CM | POA: Diagnosis not present

## 2021-06-17 DIAGNOSIS — I471 Supraventricular tachycardia: Secondary | ICD-10-CM | POA: Diagnosis not present

## 2021-06-17 DIAGNOSIS — M9903 Segmental and somatic dysfunction of lumbar region: Secondary | ICD-10-CM | POA: Diagnosis not present

## 2021-06-17 LAB — ECHOCARDIOGRAM COMPLETE
AR max vel: 3.15 cm2
AV Area VTI: 2.94 cm2
AV Area mean vel: 2.59 cm2
AV Mean grad: 4 mmHg
AV Peak grad: 5.9 mmHg
Ao pk vel: 1.21 m/s
Area-P 1/2: 2.62 cm2
Calc EF: 66.5 %
S' Lateral: 2.8 cm
Single Plane A2C EF: 66.6 %
Single Plane A4C EF: 67.1 %

## 2021-06-20 ENCOUNTER — Encounter: Payer: Self-pay | Admitting: *Deleted

## 2021-06-21 ENCOUNTER — Telehealth: Payer: Self-pay | Admitting: Emergency Medicine

## 2021-06-21 NOTE — Telephone Encounter (Signed)
-----   Message from [place], PA-C sent at 06/21/2021 10:13 AM EST ----- Heart monitor showed predominately normal rhythm with 18 brief episodes of SVT, 3 NSVT runs. Overall reassuring, SVT episodes are not very significant.

## 2021-06-21 NOTE — Telephone Encounter (Signed)
Called and spoke with patient. Results reviewed with patient, pt verbalized understanding,  questions (if any) answered.   ?

## 2021-06-24 DIAGNOSIS — M41125 Adolescent idiopathic scoliosis, thoracolumbar region: Secondary | ICD-10-CM | POA: Diagnosis not present

## 2021-06-24 DIAGNOSIS — M5417 Radiculopathy, lumbosacral region: Secondary | ICD-10-CM | POA: Diagnosis not present

## 2021-06-24 DIAGNOSIS — M9905 Segmental and somatic dysfunction of pelvic region: Secondary | ICD-10-CM | POA: Diagnosis not present

## 2021-06-24 DIAGNOSIS — M9903 Segmental and somatic dysfunction of lumbar region: Secondary | ICD-10-CM | POA: Diagnosis not present

## 2021-06-29 ENCOUNTER — Ambulatory Visit: Payer: BC Managed Care – PPO | Admitting: Cardiology

## 2021-06-29 ENCOUNTER — Other Ambulatory Visit: Payer: Self-pay

## 2021-06-29 ENCOUNTER — Encounter: Payer: Self-pay | Admitting: Cardiology

## 2021-06-29 VITALS — BP 102/74 | HR 71 | Ht 65.75 in | Wt 143.0 lb

## 2021-06-29 DIAGNOSIS — I471 Supraventricular tachycardia, unspecified: Secondary | ICD-10-CM

## 2021-06-29 DIAGNOSIS — I4719 Other supraventricular tachycardia: Secondary | ICD-10-CM

## 2021-06-29 DIAGNOSIS — I493 Ventricular premature depolarization: Secondary | ICD-10-CM

## 2021-06-29 DIAGNOSIS — R002 Palpitations: Secondary | ICD-10-CM | POA: Diagnosis not present

## 2021-06-29 MED ORDER — METOPROLOL SUCCINATE ER 50 MG PO TB24
50.0000 mg | ORAL_TABLET | Freq: Every day | ORAL | 3 refills | Status: DC
Start: 2021-06-29 — End: 2021-11-14

## 2021-06-29 NOTE — Patient Instructions (Signed)
Medication Instructions:  ?- Your physician has recommended you make the following change in your medication:  ? ?1) INCREASE Toprol XL (metoprolol succinate) to 50 mg: ?- take 1 tablet by mouth once daily  ? ?*If you need a refill on your cardiac medications before your next appointment, please call your pharmacy* ? ? ?Lab Work: ?- none ordered ? ?If you have labs (blood work) drawn today and your tests are completely normal, you will receive your results only by: ?MyChart Message (if you have MyChart) OR ?A paper copy in the mail ?If you have any lab test that is abnormal or we need to change your treatment, we will call you to review the results. ? ? ?Testing/Procedures: ?- none ordered ? ? ?Follow-Up: ?At [facility], you and your health needs are our priority.  As part of our continuing mission to provide you with exceptional heart care, we have created designated Provider Care Teams.  These Care Teams include your primary Cardiologist (physician) and Advanced Practice Providers (APPs -  Physician Assistants and Nurse Practitioners) who all work together to provide you with the care you need, when you need it. ? ?We recommend signing up for the patient portal called "MyChart".  Sign up information is provided on this After Visit Summary.  MyChart is used to connect with patients for Virtual Visits (Telemedicine).  Patients are able to view lab/test results, encounter notes, upcoming appointments, etc.  Non-urgent messages can be sent to your provider as well.   ?To learn more about what you can do with MyChart, go to [email or web address].   ? ?Your next appointment:   ?November/ December 2023  ? ?The format for your next appointment:   ?In Person ? ?Provider:   ?Lars Mage, MD  ? ? ?Other Instructions ?N/a ? ?

## 2021-06-29 NOTE — Progress Notes (Signed)
Electrophysiology Office Follow up Visit Note:    Date:  06/29/2021   ID:  Theresa Buck, DOB [birth year], MRN [id number]  PCP:  Einar Pheasant, MD  [facility] Cardiologist:  None  [facility] Electrophysiologist:  Vickie Epley, MD    Interval History:    Theresa Buck is a 66 y.o. female who presents for a follow up visit. They were last seen in clinic October 27, 2020.  She underwent a AVNRT ablation on Sep 16, 2020.  At the time of my last appointment she had done well without recurrence.  She did have an episode of palpitations May 16, 2021 that took her to the emergency department.  I do not have any EKGs of NSVT from her ER visit.  EKGs from the ER shows sinus rhythm with frequent PVCs.  There was a period of bigeminal PVCs captured on a twelve-lead.  She has worn a ZIO monitor in February 2023.  The ZIO monitor showed 18 episodes of an atrial tachycardia with the longest lasting 11 beats.  She is with her husband today in clinic.  She tells me that when she had the tachycardia episode in January she was at rest.  She felt like her heart rate sped up.  Apparently the heart rate was about 140 beats a minute.  She took an extra dose of her beta-blocker and the symptoms resolved within 30 minutes.     Past Medical History:  Diagnosis Date   Allergy    recurring sinus problems   Anxiety    Arthritis    Chronic back pain    Deviated septum    Deviated septum    GERD (gastroesophageal reflux disease)    H/O sinusitis    Headache    H/O MIGRAINES   Hx of degenerative disc disease    Scoliosis    SVT (supraventricular tachycardia) ([place])     Past Surgical History:  Procedure Laterality Date   BACK SURGERY  09/26/2013   LUMBAR   COLONOSCOPY  08/03/2015   Dr Vira Agar   HEMORRHOID SURGERY N/A 03/13/2017   Procedure: HEMORRHOIDECTOMY;  Surgeon: Christene Lye, MD;  Location: [facility] ORS;  Service: General;  Laterality: N/A;   SVT ABLATION N/A 09/16/2020    Procedure: SVT ABLATION;  Surgeon: Vickie Epley, MD;  Location: [place] [facility];  Service: Cardiovascular;  Laterality: N/A;   UPPER GI ENDOSCOPY  08/03/2015   Dr Vira Agar    Current Medications: Current Meds  Medication Sig   Biotin 5000 MCG TABS Take 5,000 mcg by mouth daily.   Calcium Carb-Cholecalciferol (CALCIUM + D3 PO) Take 1 tablet daily by mouth.   carboxymethylcellulose (REFRESH PLUS) 0.5 % SOLN Place 1 drop into both eyes 3 (three) times daily as needed (dry eyes).   estradiol (ESTRACE) 0.1 MG/GM vaginal cream Place 1 Applicatorful vaginally daily.   estradiol (ESTRACE) 1 MG tablet TAKE 1/2 TABLET(0.5 MG) BY MOUTH DAILY   hydrocortisone (ANUSOL-HC) 25 MG suppository Place 1 suppository (25 mg total) rectally 2 (two) times daily.   metoprolol succinate (TOPROL XL) 25 MG 24 hr tablet Take 1 tablet (25 mg total) by mouth daily.   montelukast (SINGULAIR) 10 MG tablet Take 10 mg at bedtime by mouth.    Multiple Vitamin (MULTIVITAMIN) tablet Take 1 tablet by mouth daily.   pantoprazole (PROTONIX) 40 MG tablet TAKE 1 TABLET BY MOUTH EVERY DAY   Probiotic Product (PROBIOTIC ADVANCED PO) Take 1 capsule daily by mouth.  progesterone (PROMETRIUM) 100 MG capsule Take 100 mg daily by mouth.   traZODone (DESYREL) 50 MG tablet Take 0.5-1 tablets (25-50 mg total) by mouth at bedtime as needed for sleep. take 1/2 to 1 tablets by mouth at bedtime if needed for sleep   vitamin C (ASCORBIC ACID) 500 MG tablet Take 500 mg by mouth daily.     Allergies:   Ceftin [cefuroxime axetil]   Social History   Socioeconomic History   Marital status: Widowed    Spouse name: Not on file   Number of children: 3   Years of education: Not on file   Highest education level: Not on file  Occupational History   Not on file  Tobacco Use   Smoking status: Never   Smokeless tobacco: Never  Vaping Use   Vaping Use: Never used  Substance and Sexual Activity   Alcohol use: No    Alcohol/week: 0.0  standard drinks   Drug use: No   Sexual activity: Not on file  Other Topics Concern   Not on file  Social History Narrative   Not on file   Social Determinants of Health   Financial Resource Strain: Not on file  Food Insecurity: Not on file  Transportation Needs: Not on file  Physical Activity: Not on file  Stress: Not on file  Social Connections: Not on file     Family History: The patient's family history includes ALS in her father; Brain cancer in her mother; Diabetes in her brother; Heart disease in her son; Throat cancer in her brother. There is no history of Breast cancer.  ROS:   Please see the history of present illness.    All other systems reviewed and are negative.  EKGs/Labs/Other Studies Reviewed:    The following studies were reviewed today:  June 20, 2021 ZIO HR 50 - 200 bpm, average 72 bpm. 3 NSVT, longest 4 beats with rate of 148 bpm. 18 SVT, longest 11 beats with a rate of 139 bpm. Rhythm strips suggest atrial tachycardia. Rare supraventricular and occasional ventricular ectopy. Symptom triggered episodes correspond to sinus rhythm.      EKG:  The ekg ordered today demonstrates sinus rhythm.  No preexcitation.  Normal intervals.  Recent Labs: 07/28/2020: ALT 16; B Natriuretic Peptide 32.2; Magnesium 2.1 09/03/2020: TSH 1.18 05/16/2021: BUN 17; Creatinine, Ser 0.58; Hemoglobin 14.6; Platelets 219; Potassium 3.7; Sodium 138  Recent Lipid Panel    Component Value Date/Time   CHOL 199 03/25/2020 0802   TRIG 95.0 03/25/2020 0802   HDL 51.50 03/25/2020 0802   CHOLHDL 4 03/25/2020 0802   VLDL 19.0 03/25/2020 0802   LDLCALC 129 (H) 03/25/2020 0802    Physical Exam:    VS:  BP 102/74 (BP Location: Left Arm, Patient Position: Sitting, Cuff Size: Normal)    Pulse 71    Ht 5' 5.75" (1.67 m)    Wt 143 lb (64.9 kg)    LMP 07/15/2012    SpO2 99%    BMI 23.26 kg/m     Wt Readings from Last 3 Encounters:  06/29/21 143 lb (64.9 kg)  06/10/21 142 lb 6.4  oz (64.6 kg)  05/24/21 144 lb (65.3 kg)     GEN:  Well nourished, well developed in no acute distress HEENT: Normal NECK: No JVD; No carotid bruits LYMPHATICS: No lymphadenopathy CARDIAC: RRR, no murmurs, rubs, gallops RESPIRATORY:  Clear to auscultation without rales, wheezing or rhonchi  ABDOMEN: Soft, non-tender, non-distended MUSCULOSKELETAL:  No edema; No deformity  SKIN: Warm and dry NEUROLOGIC:  Alert and oriented x 3 PSYCHIATRIC:  Normal affect        ASSESSMENT:    1. AVNRT (AV nodal re-entry tachycardia) ([place])   2. SVT (supraventricular tachycardia) ([facility])   3. Palpitations   4. PVC (premature ventricular contraction)    PLAN:    In order of problems listed above:   #AVNRT #SVT #Palpitations #PVC  The patient is now post a successful EP study and ablation of the slow pathway for typical AVNRT with ventricular rates greater than 200 bpm.  I suspect based on her history that this arrhythmia has not recurred.  I think she is likely having atrial tachycardia which was also seen on her recent ZIO monitor.  I discussed the options with her including continued beta-blocker at the current dose versus increasing the dose of beta-blocker versus repeat EP study.  I do not think it is time right now for repeat EP study given the low burden of her arrhythmia.  I recommended that we increase her Toprol to 50 mg by mouth once daily.  If the symptoms continue to be present, consider loop recorder implant.  If she has a long-lasting episode of tachycardia, would favor repeat EP study to exclude another inducible arrhythmia.  She sees Dr. Rockey Situ in May.  I will plan to see her at the end of the year.  She will let us know if she has more persistent tachycardias and we will get her in sooner.      Total time spent with patient today 34 minutes. This includes reviewing records, evaluating the patient and coordinating care.   Medication Adjustments/Labs and Tests Ordered: Current  medicines are reviewed at length with the patient today.  Concerns regarding medicines are outlined above.  No orders of the defined types were placed in this encounter.  No orders of the defined types were placed in this encounter.    Signed, Lars Mage, MD, [facility], [facility] 06/29/2021 8:29 AM    Electrophysiology [facility]

## 2021-07-08 IMAGING — MG DIGITAL SCREENING BILAT W/ TOMO W/ CAD
6 of 10 series · 6 of 30 positions shown · non-contrast
Comparison: Previous exam(s).

CLINICAL DATA: Screening.

EXAM:
DIGITAL SCREENING BILATERAL MAMMOGRAM WITH TOMO AND CAD

[L MLO synth-2D (1 of 2)]
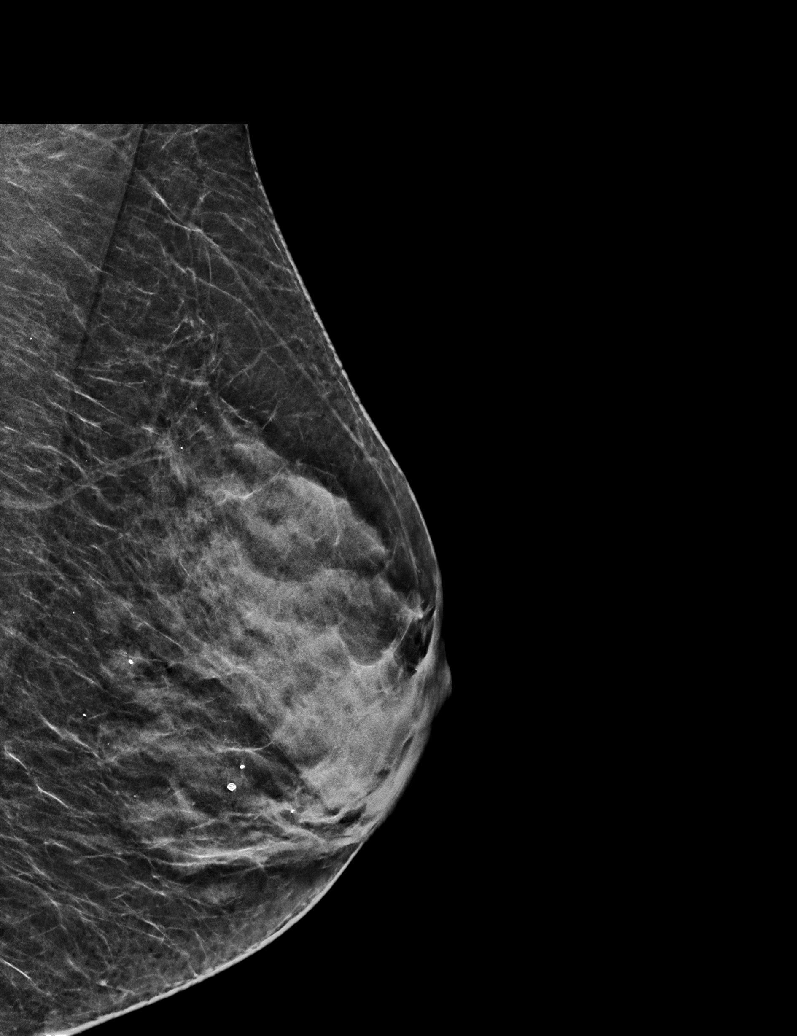

[L MLO synth-2D (2 of 2)]
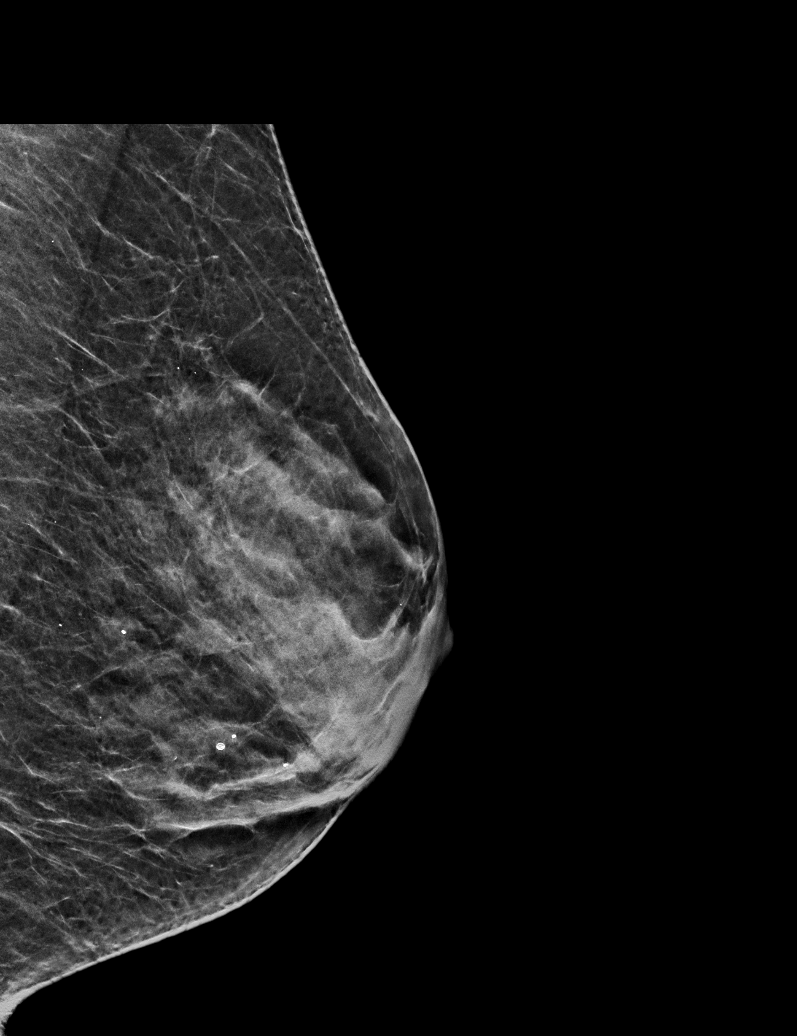

[L CC synth-2D]
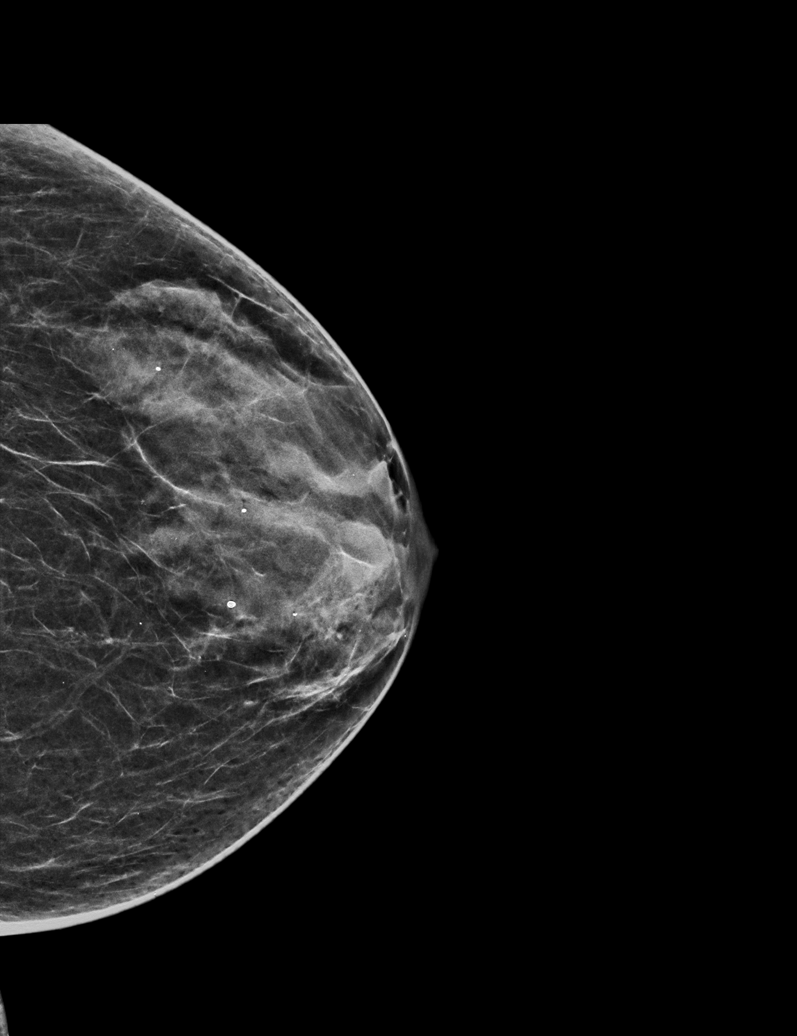

[R CC synth-2D]
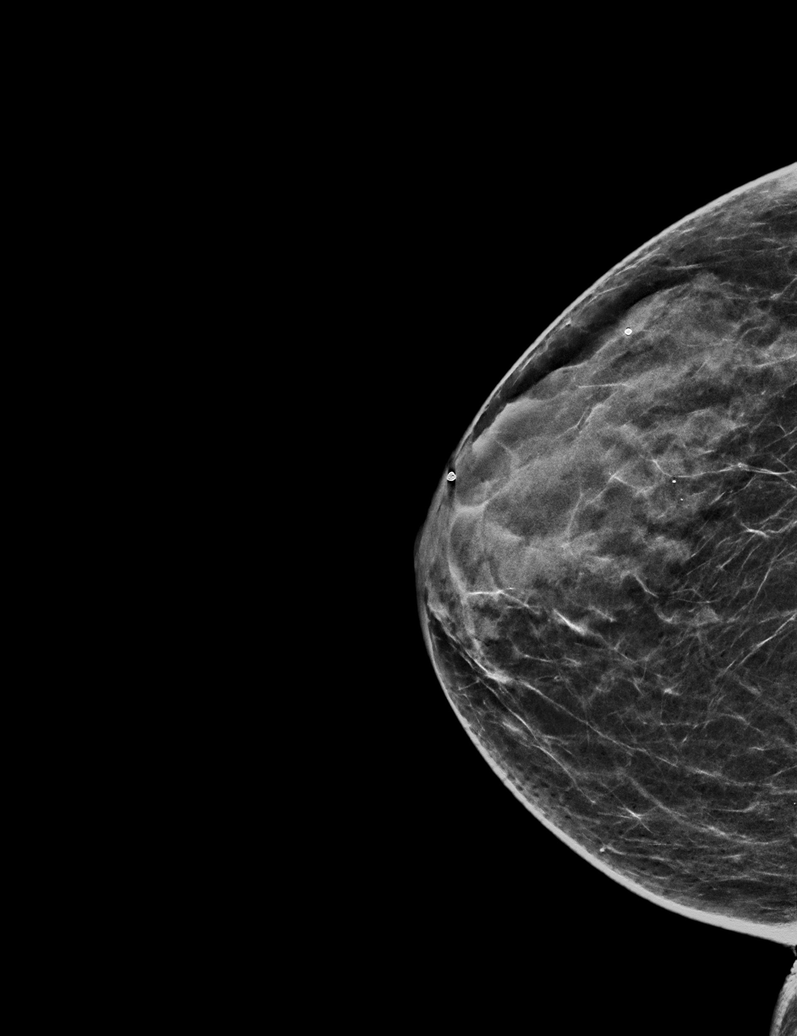

[R MLO synth-2D]
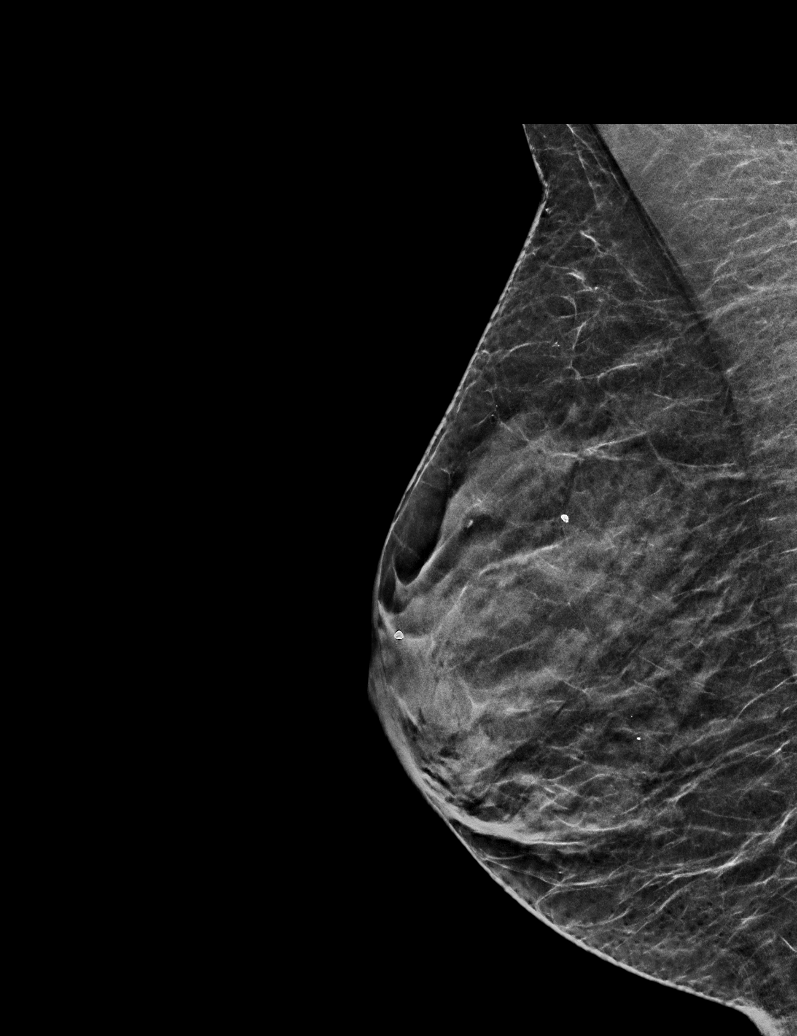

[L CC tomo · tomo slice 25/50.0]
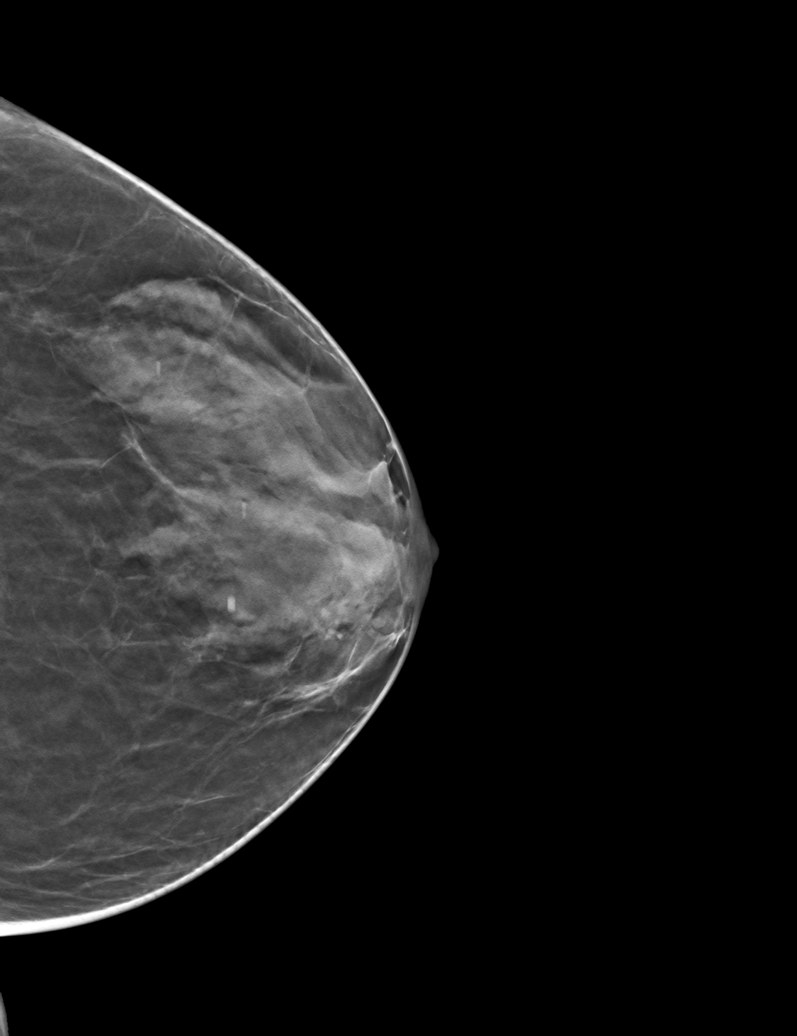

[6 of 30 positions shown; findings below may reference images not displayed]

ACR Breast Density Category c: The breast tissue is heterogeneously
dense, which may obscure small masses.
FINDINGS: There are no findings suspicious for malignancy. Images were
processed with CAD.
IMPRESSION: No mammographic evidence of malignancy. A result letter of this
screening mammogram will be mailed directly to the patient.

RECOMMENDATION:
Screening mammogram in one year. (Code:[id number])

BI-RADS CATEGORY  1: Negative.

## 2021-07-10 ENCOUNTER — Encounter: Payer: Self-pay | Admitting: Internal Medicine

## 2021-07-11 ENCOUNTER — Ambulatory Visit (INDEPENDENT_AMBULATORY_CARE_PROVIDER_SITE_OTHER): Payer: BC Managed Care – PPO | Admitting: Internal Medicine

## 2021-07-11 DIAGNOSIS — F439 Reaction to severe stress, unspecified: Secondary | ICD-10-CM | POA: Diagnosis not present

## 2021-07-11 DIAGNOSIS — N760 Acute vaginitis: Secondary | ICD-10-CM | POA: Diagnosis not present

## 2021-07-11 MED ORDER — FLUCONAZOLE 150 MG PO TABS: ORAL_TABLET | ORAL | 0 refills | Status: DC

## 2021-07-11 NOTE — Telephone Encounter (Signed)
Scheduled

## 2021-07-11 NOTE — Telephone Encounter (Signed)
Called patient to offer her an appointment. Declined appt due to unable to get off work. Co worker is on vacation this week. Patient was on abx and took last pill yesterday. Now has itching and vaginal discharge that looks like cottage cheese. She has not tried [employer]- states that she cant because that makes her burn. No urinary symptoms. Requesting to have diflucan sent in. ?

## 2021-07-11 NOTE — Telephone Encounter (Signed)
Will see her today

## 2021-07-11 NOTE — Progress Notes (Signed)
Patient ID: Theresa Buck, female   DOB: 02-Apr-1956, 66 y.o.   MRN: [id number] ? ? ?Virtual Visit via telephone Note ? ?This visit type was conducted due to national recommendations for restrictions regarding the COVID-19 pandemic (e.g. social distancing).  This format is felt to be most appropriate for this patient at this time.  All issues noted in this document were discussed and addressed.  No physical exam was performed (except for noted visual exam findings with Video Visits).  ? ?I connected with Yliana Gravois by telephone and verified that I am speaking with the correct person using two identifiers. ?Location patient: home ?Location provider: work  ?Persons participating in the telephone visit: patient, provider ? ?The limitations, risks, security and privacy concerns of performing an evaluation and management service by telephone and the availability of in person appointments have been discussed.  It has also been discussed with the patient that there may be a patient responsible charge related to this service. The patient expressed understanding and agreed to proceed. ? ? ?Reason for visit: work in appt ? ?HPI: ?Work in with concerns regarding yeast infection.  States recently treated for sinus infection.  Dr Pryor Ochoa prescribed abx.  Took for 10 days.  Better. No increased congestion. Sinus symptoms better.  No cough or chest congestion.  No sob.  States starting yesterday - noticed vaginal irritation and a cottage cheese discharge.  No urine symptoms.  No fever.  Has taken diflucan previously.  Nystatin - irritates.  States handling stress.   ? ? ?ROS: See pertinent positives and negatives per HPI. ? ?Past Medical History:  ?Diagnosis Date  ? Allergy   ? recurring sinus problems  ? Anxiety   ? Arthritis   ? Chronic back pain   ? Deviated septum   ? Deviated septum   ? GERD (gastroesophageal reflux disease)   ? H/O sinusitis   ? Headache   ? H/O MIGRAINES  ? Hx of degenerative disc disease   ? Scoliosis   ?  SVT (supraventricular tachycardia) ([place])   ? ? ?Past Surgical History:  ?Procedure Laterality Date  ? BACK SURGERY  09/26/2013  ? LUMBAR  ? COLONOSCOPY  08/03/2015  ? Dr Vira Agar  ? HEMORRHOID SURGERY N/A 03/13/2017  ? Procedure: HEMORRHOIDECTOMY;  Surgeon: Christene Lye, MD;  Location: [facility] ORS;  Service: General;  Laterality: N/A;  ? SVT ABLATION N/A 09/16/2020  ? Procedure: SVT ABLATION;  Surgeon: Vickie Epley, MD;  Location: [place] [facility];  Service: Cardiovascular;  Laterality: N/A;  ? UPPER GI ENDOSCOPY  08/03/2015  ? Dr Vira Agar  ? ? ?Family History  ?Problem Relation Age of Onset  ? Brain cancer Mother   ? ALS Father   ? Diabetes Brother   ?     multiple  ? Heart disease Son   ?     myocardial infarction, s/p CABG  ? Throat cancer Brother   ? Breast cancer Neg Hx   ? ? ?SOCIAL HX: reviewed.  ? ? ?Current Outpatient Medications:  ?  fluconazole (DIFLUCAN) 150 MG tablet, Take one tablet x 1 day.  May repeat x 1 if symptoms persists or do not resolve., Disp: 2 tablet, Rfl: 0 ?  Biotin 5000 MCG TABS, Take 5,000 mcg by mouth daily., Disp: , Rfl:  ?  Calcium Carb-Cholecalciferol (CALCIUM + D3 PO), Take 1 tablet daily by mouth., Disp: , Rfl:  ?  carboxymethylcellulose (REFRESH PLUS) 0.5 % SOLN, Place 1 drop into both eyes  3 (three) times daily as needed (dry eyes)., Disp: , Rfl:  ?  estradiol (ESTRACE) 0.1 MG/GM vaginal cream, Place 1 Applicatorful vaginally daily., Disp: 42.5 g, Rfl: 1 ?  estradiol (ESTRACE) 1 MG tablet, TAKE 1/2 TABLET(0.5 MG) BY MOUTH DAILY, Disp: 45 tablet, Rfl: 1 ?  hydrocortisone (ANUSOL-HC) 25 MG suppository, Place 1 suppository (25 mg total) rectally 2 (two) times daily., Disp: 20 suppository, Rfl: 0 ?  metoprolol succinate (TOPROL-XL) 50 MG 24 hr tablet, Take 1 tablet (50 mg total) by mouth daily. Take with or immediately following a meal., Disp: 90 tablet, Rfl: 3 ?  montelukast (SINGULAIR) 10 MG tablet, Take 10 mg at bedtime by mouth. , Disp: , Rfl: 1 ?  Multiple Vitamin  (MULTIVITAMIN) tablet, Take 1 tablet by mouth daily., Disp: , Rfl:  ?  pantoprazole (PROTONIX) 40 MG tablet, TAKE 1 TABLET BY MOUTH EVERY DAY, Disp: 90 tablet, Rfl: 3 ?  Probiotic Product (PROBIOTIC ADVANCED PO), Take 1 capsule daily by mouth. , Disp: , Rfl:  ?  progesterone (PROMETRIUM) 100 MG capsule, Take 100 mg daily by mouth., Disp: , Rfl: 0 ?  traZODone (DESYREL) 50 MG tablet, Take 0.5-1 tablets (25-50 mg total) by mouth at bedtime as needed for sleep. take 1/2 to 1 tablets by mouth at bedtime if needed for sleep, Disp: 30 tablet, Rfl: 1 ?  vitamin C (ASCORBIC ACID) 500 MG tablet, Take 500 mg by mouth daily., Disp: , Rfl:  ? ?EXAM: ? ?GENERAL: alert, oriented, appears well and in no acute distress ? ?PSYCH/NEURO: pleasant and cooperative, no obvious depression or anxiety, speech and thought processing grossly intact ? ?ASSESSMENT AND PLAN: ? ?Discussed the following assessment and plan: ? ?Problem List Items Addressed This Visit   ? ? Acute vaginitis  ?  Vaginal irritation and discharge as outlined.  Recent abx.  Appears to be c/w yeast infection.  Diflucan as directed.  Follow.  Call with update.  ?  ?  ? Stress  ?  Feel handling things well.  Follow.  ?  ?  ? ? ?Return if symptoms worsen or fail to improve, for keep scheduled. ?  ?I discussed the assessment and treatment plan with the patient. The patient was provided an opportunity to ask questions and all were answered. The patient agreed with the plan and demonstrated an understanding of the instructions. ?  ?The patient was advised to call back or seek an in-person evaluation if the symptoms worsen or if the condition fails to improve as anticipated. ? ?I provided 15 minutes of non-face-to-face time during this encounter. ? ? ?Einar Pheasant, MD   ?

## 2021-07-12 ENCOUNTER — Encounter: Payer: Self-pay | Admitting: Internal Medicine

## 2021-07-12 NOTE — Assessment & Plan Note (Signed)
Vaginal irritation and discharge as outlined.  Recent abx.  Appears to be c/w yeast infection.  Diflucan as directed.  Follow.  Call with update.  ?

## 2021-07-12 NOTE — Assessment & Plan Note (Signed)
Feel handling things well.  Follow.  ?

## 2021-07-14 ENCOUNTER — Other Ambulatory Visit (INDEPENDENT_AMBULATORY_CARE_PROVIDER_SITE_OTHER): Payer: BC Managed Care – PPO

## 2021-07-14 ENCOUNTER — Other Ambulatory Visit: Payer: Self-pay

## 2021-07-14 DIAGNOSIS — I471 Supraventricular tachycardia: Secondary | ICD-10-CM | POA: Diagnosis not present

## 2021-07-14 DIAGNOSIS — Z13220 Encounter for screening for lipoid disorders: Secondary | ICD-10-CM

## 2021-07-14 LAB — BASIC METABOLIC PANEL
BUN: 17 mg/dL (ref 6–23)
CO2: 33 mEq/L — ABNORMAL HIGH (ref 19–32)
Calcium: 9.2 mg/dL (ref 8.4–10.5)
Chloride: 103 mEq/L (ref 96–112)
Creatinine, Ser: 0.8 mg/dL (ref 0.40–1.20)
GFR: 77.85 mL/min (ref >60.00)
Glucose, Bld: 104 mg/dL — ABNORMAL HIGH (ref 70–99)
Potassium: 4.2 mEq/L (ref 3.5–5.1)
Sodium: 141 mEq/L (ref 135–145)

## 2021-07-14 LAB — HEPATIC FUNCTION PANEL
ALT: 18 U/L (ref 0–35)
AST: 16 U/L (ref 0–37)
Albumin: 4.4 g/dL (ref 3.5–5.2)
Alkaline Phosphatase: 71 U/L (ref 39–117)
Bilirubin, Direct: 0.1 mg/dL (ref 0.0–0.3)
Total Bilirubin: 0.6 mg/dL (ref 0.2–1.2)
Total Protein: 6.8 g/dL (ref 6.0–8.3)

## 2021-07-14 LAB — MAGNESIUM: Magnesium: 2 mg/dL (ref 1.5–2.5)

## 2021-07-14 LAB — LIPID PANEL
Cholesterol: 192 mg/dL (ref 0–200)
HDL: 53.9 mg/dL (ref >39.00)
LDL Cholesterol: 123 mg/dL — ABNORMAL HIGH (ref 0–99)
NonHDL: 138.35
Total CHOL/HDL Ratio: 4
Triglycerides: 77 mg/dL (ref 0.0–149.0)
VLDL: 15.4 mg/dL (ref 0.0–40.0)

## 2021-07-14 LAB — TSH: TSH: 1.98 u[IU]/mL (ref 0.35–5.50)

## 2021-07-15 DIAGNOSIS — M9905 Segmental and somatic dysfunction of pelvic region: Secondary | ICD-10-CM | POA: Diagnosis not present

## 2021-07-15 DIAGNOSIS — M9903 Segmental and somatic dysfunction of lumbar region: Secondary | ICD-10-CM | POA: Diagnosis not present

## 2021-07-15 DIAGNOSIS — M5417 Radiculopathy, lumbosacral region: Secondary | ICD-10-CM | POA: Diagnosis not present

## 2021-07-15 DIAGNOSIS — M41125 Adolescent idiopathic scoliosis, thoracolumbar region: Secondary | ICD-10-CM | POA: Diagnosis not present

## 2021-07-20 DIAGNOSIS — M778 Other enthesopathies, not elsewhere classified: Secondary | ICD-10-CM | POA: Diagnosis not present

## 2021-07-20 DIAGNOSIS — M7542 Impingement syndrome of left shoulder: Secondary | ICD-10-CM | POA: Diagnosis not present

## 2021-07-20 DIAGNOSIS — M25512 Pain in left shoulder: Secondary | ICD-10-CM | POA: Diagnosis not present

## 2021-07-20 DIAGNOSIS — M19012 Primary osteoarthritis, left shoulder: Secondary | ICD-10-CM | POA: Diagnosis not present

## 2021-07-25 ENCOUNTER — Encounter: Payer: Self-pay | Admitting: Internal Medicine

## 2021-07-25 NOTE — Telephone Encounter (Signed)
See if she is agreeable for swab tomorrow and me work her in over the next couple of days.   If she needs something earlier, ok to schedule with another provider.  ?

## 2021-07-25 NOTE — Telephone Encounter (Signed)
We do not have any openings until Thurs. Do you want to add her on tomorrow afternoon or should I have her see another provider this time? ? ?

## 2021-07-25 NOTE — Telephone Encounter (Signed)
Patient has already scheduled for UC visit. She will follow up after./ ?

## 2021-07-26 ENCOUNTER — Ambulatory Visit
Admission: RE | Admit: 2021-07-26 | Discharge: 2021-07-26 | Disposition: A | Discharge status: Home / Self Care | Payer: BC Managed Care – PPO | Source: Ambulatory Visit | Attending: Emergency Medicine | Admitting: Emergency Medicine

## 2021-07-26 ENCOUNTER — Ambulatory Visit: Payer: Self-pay

## 2021-07-26 VITALS — BP 123/83 | HR 64 | Temp 98.2°F | Resp 18

## 2021-07-26 DIAGNOSIS — J029 Acute pharyngitis, unspecified: Secondary | ICD-10-CM

## 2021-07-26 DIAGNOSIS — J0100 Acute maxillary sinusitis, unspecified: Secondary | ICD-10-CM | POA: Diagnosis not present

## 2021-07-26 LAB — POCT RAPID STREP A (OFFICE): Rapid Strep A Screen: NEGATIVE

## 2021-07-26 MED ORDER — PREDNISONE 10 MG (21) PO TBPK
ORAL_TABLET | Freq: Every day | ORAL | 0 refills | Status: DC
Start: 2021-07-26 — End: 2021-10-15

## 2021-07-26 MED ORDER — DOXYCYCLINE HYCLATE 100 MG PO CAPS: 100.0000 mg | ORAL_CAPSULE | Freq: Two times a day (BID) | ORAL | 0 refills | Status: AC

## 2021-07-26 NOTE — ED Triage Notes (Signed)
Pt here with recurrent post nasal drip and congestion and presents with sore throat x 1 week.  ?

## 2021-07-26 NOTE — Discharge Instructions (Addendum)
Take the doxycycline and prednisone as directed.  Follow up with your primary care provider or ENT if your symptoms are not improving.   ? ?

## 2021-07-26 NOTE — ED Provider Notes (Signed)
?[facility] ? ? ? ?CSN: [id number] ?Arrival date & time: 07/26/21  0803 ? ? ?  ? ?History   ?Chief Complaint ?Chief Complaint  ?Patient presents with  ? Sore Throat  ?  Entered by patient  ? Post Nasal Drip  ? ? ?HPI ?Theresa Buck is a 66 y.o. female.  Patient presents with sinus pressure, sinus congestion, postnasal drip x4 weeks.  She also has sore throat and swollen cervical lymph nodes.  Treatment at home with Mucinex and recent antibiotic.  Her symptoms improved on the antibiotic but then came back immediately.  She denies fever, chills, cough, shortness of breath, or other symptoms.  Patient contacted her ENT by telephone on 06/29/2021 and was prescribed Bactrim.  Her medical history includes deviated septum, sinusitis, seasonal allergies, migraine headaches, SVT, chronic back pain, scoliosis, degenerative disc disease, arthritis, anxiety. ? ?The history is provided by the patient and medical records.  ? ?Past Medical History:  ?Diagnosis Date  ? Allergy   ? recurring sinus problems  ? Anxiety   ? Arthritis   ? Chronic back pain   ? Deviated septum   ? Deviated septum   ? GERD (gastroesophageal reflux disease)   ? H/O sinusitis   ? Headache   ? H/O MIGRAINES  ? Hx of degenerative disc disease   ? Scoliosis   ? SVT (supraventricular tachycardia) ([place])   ? ? ?Patient Active Problem List  ? Diagnosis Date Noted  ? Vaginal lesion 06/15/2021  ? Constipation 06/11/2021  ? Back pain 12/12/2020  ? Current use of estrogen therapy 12/12/2020  ? COVID-19 virus infection 05/02/2020  ? Hearing loss 06/30/2018  ? Hemorrhoids 12/10/2016  ? Fullness of breast 08/11/2015  ? Epigastric pain 02/14/2015  ? Pain of left foot 08/16/2014  ? Health care maintenance 08/16/2014  ? Other specified symptoms and signs involving the digestive system and abdomen 07/16/2014  ? Mastodynia 04/06/2013  ? Acute vaginitis 02/09/2013  ? Supraventricular tachycardia ([place]) 08/27/2012  ? Stress 08/27/2012  ? Other nonmedicinal substance  allergy status 04/07/2012  ? Gastro-esophageal reflux disease without esophagitis 04/07/2012  ? ? ?Past Surgical History:  ?Procedure Laterality Date  ? BACK SURGERY  09/26/2013  ? LUMBAR  ? COLONOSCOPY  08/03/2015  ? Dr Vira Agar  ? HEMORRHOID SURGERY N/A 03/13/2017  ? Procedure: HEMORRHOIDECTOMY;  Surgeon: Christene Lye, MD;  Location: [facility];  Service: General;  Laterality: N/A;  ? SVT ABLATION N/A 09/16/2020  ? Procedure: SVT ABLATION;  Surgeon: Vickie Epley, MD;  Location: [place] [facility];  Service: Cardiovascular;  Laterality: N/A;  ? UPPER GI ENDOSCOPY  08/03/2015  ? Dr Vira Agar  ? ? ?OB History   ? ? Gravida  ?3  ? Para  ?3  ? Term  ?   ? Preterm  ?   ? AB  ?   ? Living  ?   ?  ? ? SAB  ?   ? IAB  ?   ? Ectopic  ?   ? Multiple  ?   ? Live Births  ?   ?   ?  ? Obstetric Comments  ?1st Menstrual Cycle:  17  ?1st Pregnancy:  19  ?  ? ?  ? ? ? ?Home Medications   ? ?Prior to Admission medications   ?Medication Sig Start Date End Date Taking? Authorizing Provider  ?doxycycline (VIBRAMYCIN) 100 MG capsule Take 1 capsule (100 mg total) by mouth 2 (two) times daily for  7 days. 07/26/21 08/02/21 Yes Sharion Balloon, NP  ?predniSONE (STERAPRED UNI-PAK 21 TAB) 10 MG (21) TBPK tablet Take by mouth daily. As directed 07/26/21  Yes Sharion Balloon, NP  ?Biotin 5000 MCG TABS Take 5,000 mcg by mouth daily.    [provider]  ?Calcium Carb-Cholecalciferol (CALCIUM + D3 PO) Take 1 tablet daily by mouth.    [provider]  ?carboxymethylcellulose (REFRESH PLUS) 0.5 % SOLN Place 1 drop into both eyes 3 (three) times daily as needed (dry eyes).    [provider]  ?estradiol (ESTRACE) 0.1 MG/GM vaginal cream Place 1 Applicatorful vaginally daily. 12/12/20   Einar Pheasant, MD  ?estradiol (ESTRACE) 1 MG tablet TAKE 1/2 TABLET(0.5 MG) BY MOUTH DAILY 03/28/21   Einar Pheasant, MD  ?fluconazole (DIFLUCAN) 150 MG tablet Take one tablet x 1 day.  May repeat x 1 if symptoms persists or do not  resolve. 07/11/21   Einar Pheasant, MD  ?hydrocortisone (ANUSOL-HC) 25 MG suppository Place 1 suppository (25 mg total) rectally 2 (two) times daily. 06/10/21   Einar Pheasant, MD  ?metoprolol succinate (TOPROL-XL) 50 MG 24 hr tablet Take 1 tablet (50 mg total) by mouth daily. Take with or immediately following a meal. 06/29/21 09/27/21  Vickie Epley, MD  ?montelukast (SINGULAIR) 10 MG tablet Take 10 mg at bedtime by mouth.  09/29/16   [provider]  ?Multiple Vitamin (MULTIVITAMIN) tablet Take 1 tablet by mouth daily.    [provider]  ?pantoprazole (PROTONIX) 40 MG tablet TAKE 1 TABLET BY MOUTH EVERY DAY 02/04/21   Einar Pheasant, MD  ?Probiotic Product (PROBIOTIC ADVANCED PO) Take 1 capsule daily by mouth.     [provider]  ?progesterone (PROMETRIUM) 100 MG capsule Take 100 mg daily by mouth. 01/22/17   [provider]  ?traZODone (DESYREL) 50 MG tablet Take 0.5-1 tablets (25-50 mg total) by mouth at bedtime as needed for sleep. take 1/2 to 1 tablets by mouth at bedtime if needed for sleep 09/12/20   Einar Pheasant, MD  ?vitamin C (ASCORBIC ACID) 500 MG tablet Take 500 mg by mouth daily.    [provider]  ? ? ?Family History ?Family History  ?Problem Relation Age of Onset  ? Brain cancer Mother   ? ALS Father   ? Diabetes Brother   ?     multiple  ? Heart disease Son   ?     myocardial infarction, s/p CABG  ? Throat cancer Brother   ? Breast cancer Neg Hx   ? ? ?Social History ?Social History  ? ?Tobacco Use  ? Smoking status: Never  ? Smokeless tobacco: Never  ?Vaping Use  ? Vaping Use: Never used  ?Substance Use Topics  ? Alcohol use: No  ?  Alcohol/week: 0.0 standard drinks  ? Drug use: No  ? ? ? ?Allergies   ?Ceftin [cefuroxime axetil] ? ? ?Review of Systems ?Review of Systems  ?Constitutional:  Negative for chills and fever.  ?HENT:  Positive for congestion, postnasal drip, rhinorrhea, sinus pressure, sinus pain and sore throat. Negative for ear pain.    ?Respiratory:  Negative for cough and shortness of breath.   ?Cardiovascular:  Negative for chest pain and palpitations.  ?Skin:  Negative for color change and rash.  ?All other systems reviewed and are negative. ? ? ?Physical Exam ?Triage Vital Signs ?ED Triage Vitals  ?Enc Vitals Group  ?   BP   ?   Pulse   ?  Resp   ?   Temp   ?   Temp src   ?   SpO2   ?   Weight   ?   Height   ?   Head Circumference   ?   Peak Flow   ?   Pain Score   ?   Pain Loc   ?   Pain Edu?   ?   Excl. in [place]?   ? ?No data found. ? ?Updated Vital Signs ?BP 123/83   Pulse 64   Temp 98.2 ?F (36.8 ?C)   Resp 18   LMP 07/15/2012   SpO2 98%  ? ?Visual Acuity ?Right Eye Distance:   ?Left Eye Distance:   ?Bilateral Distance:   ? ?Right Eye Near:   ?Left Eye Near:    ?Bilateral Near:    ? ?Physical Exam ?Vitals and nursing note reviewed.  ?Constitutional:   ?   General: She is not in acute distress. ?   Appearance: She is well-developed. She is not ill-appearing.  ?HENT:  ?   Right Ear: Tympanic membrane normal.  ?   Left Ear: Tympanic membrane normal.  ?   Nose: Congestion present.  ?   Mouth/Throat:  ?   Mouth: Mucous membranes are moist.  ?   Pharynx: Posterior oropharyngeal erythema present.  ?Cardiovascular:  ?   Rate and Rhythm: Normal rate and regular rhythm.  ?   Heart sounds: Normal heart sounds.  ?Pulmonary:  ?   Effort: Pulmonary effort is normal. No respiratory distress.  ?   Breath sounds: Normal breath sounds.  ?Musculoskeletal:  ?   Cervical back: Neck supple.  ?Skin: ?   General: Skin is warm and dry.  ?Neurological:  ?   Mental Status: She is alert.  ?Psychiatric:     ?   Mood and Affect: Mood normal.     ?   Behavior: Behavior normal.  ? ? ? ?UC Treatments / Results  ?Labs ?(all labs ordered are listed, but only abnormal results are displayed) ?Labs Reviewed  ?POCT RAPID STREP A (OFFICE)  ? ? ?EKG ? ? ?Radiology ?No results found. ? ?Procedures ?Procedures (including critical care time) ? ?Medications Ordered in  UC ?Medications - No data to display ? ?Initial Impression / Assessment and Plan / UC Course  ?I have reviewed the triage vital signs and the nursing notes. ? ?Pertinent labs & imaging results that were available during my care of

## 2021-07-27 ENCOUNTER — Ambulatory Visit: Payer: BC Managed Care – PPO | Admitting: Cardiology

## 2021-08-04 ENCOUNTER — Encounter: Payer: Self-pay | Admitting: Internal Medicine

## 2021-08-04 MED ORDER — FLUCONAZOLE 150 MG PO TABS: ORAL_TABLET | ORAL | 0 refills | Status: DC

## 2021-08-04 NOTE — Telephone Encounter (Signed)
Rx sent in for diflucan.   

## 2021-08-05 ENCOUNTER — Encounter: Payer: Self-pay | Admitting: Obstetrics

## 2021-08-19 DIAGNOSIS — M9905 Segmental and somatic dysfunction of pelvic region: Secondary | ICD-10-CM | POA: Diagnosis not present

## 2021-08-19 DIAGNOSIS — M5417 Radiculopathy, lumbosacral region: Secondary | ICD-10-CM | POA: Diagnosis not present

## 2021-08-19 DIAGNOSIS — M41125 Adolescent idiopathic scoliosis, thoracolumbar region: Secondary | ICD-10-CM | POA: Diagnosis not present

## 2021-08-19 DIAGNOSIS — M9903 Segmental and somatic dysfunction of lumbar region: Secondary | ICD-10-CM | POA: Diagnosis not present

## 2021-08-24 NOTE — Progress Notes (Signed)
Cardiology Office Note ? ?Date:  08/26/2021  ? ?ID:  Theresa Buck, DOB 10-Dec-1955, MRN [id number] ? ?PCP:  Einar Pheasant, MD  ? ?Chief Complaint  ?Patient presents with  ? 3 month follow up   ?  Follow up Echo. Medications reviewed by the patient verbally.   ? ? ?HPI:  ?Theresa Buck is a 66 y.o. female with a hx of  ?paroxysmal tachycardia dating back 10 years,  ?Previously seen in 2014 for paroxysmal tachycardia, having severe episodes at that time, lost to follow-up ?With continued episodes and recent presentation to the hospital March 2022 ?EKG documented wide-complex tachycardia concerning for SVT ?With associated diaphoresis, near syncope, breaking on its own ?Echo with ejection fraction 45 to 50% in March 2022 ?Who presents for follow-up of her SVT, status post ablation, cardiomyopathy ? ?Last seen by myself in clinic July 15, 2020, ?Seen in the emergency room May 16, 2021 for tachypalpitations ?Seen by one of our providers January 2023 ? ?Echocardiogram 06/17/2021 ejection fraction 55 to 60% ?Ejection fraction increased from 45 to 50% in 2022 ? 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 ?So far tolerating metoprolol succinate 50 mg daily ? ?EKG personally reviewed by myself on todays visit ?Normal sinus rhythm rate 72 bpm PVC ? ? ?PMH:   has a past medical history of Allergy, Anxiety, Arthritis, Chronic back pain, Deviated septum, Deviated septum, GERD (gastroesophageal reflux disease), H/O sinusitis, Headache, degenerative disc disease, Scoliosis, and SVT (supraventricular tachycardia) ([place]). ? ?PSH:    ?Past Surgical History:  ?Procedure Laterality Date  ? BACK SURGERY  09/26/2013  ? LUMBAR  ? COLONOSCOPY  08/03/2015  ? Dr Vira Agar  ? HEMORRHOID SURGERY N/A 03/13/2017  ? Procedure: HEMORRHOIDECTOMY;  Surgeon: Christene Lye, MD;  Location: [facility];  Service: General;  Laterality: N/A;  ? SVT ABLATION N/A 09/16/2020  ? Procedure: SVT ABLATION;  Surgeon: Vickie Epley, MD;  Location: [place] [facility];  Service: Cardiovascular;  Laterality: N/A;  ? UPPER GI ENDOSCOPY  08/03/2015  ? Dr Vira Agar  ? ? ?Current Outpatient Medications  ?Medication Sig Dispense Refill  ? Biotin 5000 MCG TABS Take 5,000 mcg by mouth daily.    ? Calcium Carb-Cholecalciferol (CALCIUM + D3 PO) Take 1 tablet daily by mouth.    ? carboxymethylcellulose (REFRESH PLUS) 0.5 % SOLN Place 1 drop into both eyes 3 (three) times daily as needed (dry eyes).    ? estradiol (ESTRACE) 0.1 MG/GM vaginal cream Place 1 Applicatorful vaginally daily. 42.5 g 1  ? estradiol (ESTRACE) 1 MG tablet TAKE 1/2 TABLET(0.5 MG) BY MOUTH DAILY 45 tablet 1  ? fluconazole (DIFLUCAN) 150 MG tablet Take one tablet x 1 day.  May repeat x 1 if symptoms persists or do not resolve. 2 tablet 0  ? hydrocortisone (ANUSOL-HC) 25 MG suppository Place 1 suppository (25 mg total) rectally 2 (two) times daily. 20 suppository 0  ? metoprolol succinate (TOPROL-XL) 50 MG 24 hr tablet Take 1 tablet (50 mg total) by mouth daily. Take with or immediately following a meal. 90 tablet 3  ? montelukast (SINGULAIR) 10 MG tablet Take 10 mg at bedtime by mouth.   1  ? Multiple Vitamin (MULTIVITAMIN) tablet Take 1 tablet by mouth daily.    ? pantoprazole (PROTONIX) 40 MG tablet TAKE 1 TABLET BY  MOUTH EVERY DAY 90 tablet 3  ? predniSONE (STERAPRED UNI-PAK 21 TAB) 10 MG (21) TBPK tablet Take by mouth daily. As directed 21 tablet 0  ? Probiotic Product (PROBIOTIC ADVANCED PO) Take 1 capsule daily by mouth.     ? progesterone (PROMETRIUM) 100 MG capsule Take 100 mg daily by mouth.  0  ? traZODone (DESYREL) 50 MG tablet Take 0.5-1 tablets (25-50 mg total) by mouth at bedtime as needed for sleep. take 1/2 to 1 tablets by mouth at bedtime if needed for sleep 30 tablet 1  ? vitamin C (ASCORBIC ACID) 500 MG  tablet Take 500 mg by mouth daily.    ? ?No current facility-administered medications for this visit.  ? ? ?Allergies:   Ceftin [cefuroxime axetil]  ? ?Social History:  The patient  reports that she has never smoked. She has never used smokeless tobacco. She reports that she does not drink alcohol and does not use drugs.  ? ?Family History:   family history includes ALS in her father; Brain cancer in her mother; Diabetes in her brother; Heart disease in her son; Throat cancer in her brother.  ? ?Review of Systems: ?Review of Systems  ?Constitutional: Negative.   ?HENT: Negative.    ?Respiratory: Negative.    ?Cardiovascular: Negative.   ?Gastrointestinal: Negative.   ?Musculoskeletal: Negative.   ?Neurological: Negative.   ?Psychiatric/Behavioral: Negative.    ?All other systems reviewed and are negative. ? ?PHYSICAL EXAM: ?VS:  BP 100/70 (BP Location: Left Arm, Patient Position: Sitting, Cuff Size: Normal)   Pulse 72   Ht '5\' 6"'$  (1.676 m)   Wt 141 lb 8 oz (64.2 kg)   LMP 07/15/2012   SpO2 98%   BMI 22.84 kg/m?  , BMI Body mass index is 22.84 kg/m?. ?GEN: Well nourished, well developed, in no acute distress ?HEENT: normal ?Neck: no JVD, carotid bruits, or masses ?Cardiac: RRR; no murmurs, rubs, or gallops,no edema  ?Respiratory:  clear to auscultation bilaterally, normal work of breathing ?GI: soft, nontender, nondistended, + BS ?MS: no deformity or atrophy ?Skin: warm and dry, no rash ?Neuro:  Strength and sensation are intact ?Psych: euthymic mood, full affect ? ? ?Recent Labs: ?05/16/2021: Hemoglobin 14.6; Platelets 219 ?07/14/2021: ALT 18; BUN 17; Creatinine, Ser 0.80; Magnesium 2.0; Potassium 4.2; Sodium 141; TSH 1.98  ? ? ?Lipid Panel ?Lab Results  ?Component Value Date  ? CHOL 192 07/14/2021  ? HDL 53.90 07/14/2021  ? LDLCALC 123 (H) 07/14/2021  ? TRIG 77.0 07/14/2021  ? ?  ? ?Wt Readings from Last 3 Encounters:  ?08/26/21 141 lb 8 oz (64.2 kg)  ?07/11/21 143 lb (64.9 kg)  ?06/29/21 143 lb (64.9 kg)  ?   ? ? ? ?ASSESSMENT AND PLAN: ? ?Problem List Items Addressed This Visit   ?None ?Visit Diagnoses   ? ? SVT (supraventricular tachycardia) ([place])    -  Primary  ? Relevant Orders  ? EKG 12-Lead  ? AVNRT (AV nodal re-entry tachycardia) ([place])      ? Relevant Orders  ? EKG 12-Lead  ? Palpitations      ? Relevant Orders  ? EKG 12-Lead  ? Nonischemic cardiomyopathy ([place])      ? Relevant Orders  ? EKG 12-Lead  ? Depressed left ventricular ejection fraction      ? Dilated cardiomyopathy ([place])      ? ?  ?Wide-complex tachycardia/SVT ?Status post ablation ?Minimal breakthrough arrhythmia ?Recommend we continue the metoprolol succinate 50 mg daily ?Followed by Dr.  Quentin Ore ? ?  ?2.  Cardiomyopathy/depressed ejection fraction ?Unable to exclude tachycardia mediated, stress mediated ?Ejection fraction has improved on repeat study ? ?3.  PVCs ?Appears to be RV outflow tract based ?Asymptomatic, on metoprolol succinate ? ? ? Total encounter time more than 30 minutes ? Greater than 50% was spent in counseling and coordination of care with the patient ? ? ? ?Signed, ?Esmond Plants, M.D., Ph.D. ?[facility] [place], [place] ?[phone] ?

## 2021-08-26 ENCOUNTER — Ambulatory Visit: Payer: BC Managed Care – PPO | Admitting: Cardiovascular Disease

## 2021-08-26 ENCOUNTER — Encounter: Payer: Self-pay | Admitting: Cardiovascular Disease

## 2021-08-26 VITALS — BP 100/70 | HR 72 | Ht 66.0 in | Wt 141.5 lb

## 2021-08-26 DIAGNOSIS — I471 Supraventricular tachycardia, unspecified: Secondary | ICD-10-CM

## 2021-08-26 DIAGNOSIS — R931 Abnormal findings on diagnostic imaging of heart and coronary circulation: Secondary | ICD-10-CM

## 2021-08-26 DIAGNOSIS — I428 Other cardiomyopathies: Secondary | ICD-10-CM

## 2021-08-26 DIAGNOSIS — R002 Palpitations: Secondary | ICD-10-CM | POA: Diagnosis not present

## 2021-08-26 DIAGNOSIS — R0989 Other specified symptoms and signs involving the circulatory and respiratory systems: Secondary | ICD-10-CM | POA: Diagnosis not present

## 2021-08-26 DIAGNOSIS — I4719 Other supraventricular tachycardia: Secondary | ICD-10-CM

## 2021-08-26 DIAGNOSIS — I420 Dilated cardiomyopathy: Secondary | ICD-10-CM

## 2021-08-26 NOTE — Patient Instructions (Signed)
Medication Instructions:  No changes  If you need a refill on your cardiac medications before your next appointment, please call your pharmacy.   Lab work: No new labs needed  Testing/Procedures: No new testing needed  Follow-Up: At [facility], you and your health needs are our priority.  As part of our continuing mission to provide you with exceptional heart care, we have created designated Provider Care Teams.  These Care Teams include your primary Cardiologist (physician) and Advanced Practice Providers (APPs -  Physician Assistants and Nurse Practitioners) who all work together to provide you with the care you need, when you need it.  You will need a follow up appointment in 12 months  Providers on your designated Care Team:   Christopher Berge, NP Ryan Dunn, PA-C Cadence Furth, PA-C  COVID-19 Vaccine Information can be found at: [email or web address] For questions related to vaccine distribution or appointments, please email [email or web address] or call [phone].   

## 2021-09-09 DIAGNOSIS — J320 Chronic maxillary sinusitis: Secondary | ICD-10-CM | POA: Diagnosis not present

## 2021-09-09 DIAGNOSIS — H6063 Unspecified chronic otitis externa, bilateral: Secondary | ICD-10-CM | POA: Diagnosis not present

## 2021-09-09 DIAGNOSIS — J301 Allergic rhinitis due to pollen: Secondary | ICD-10-CM | POA: Diagnosis not present

## 2021-09-16 ENCOUNTER — Ambulatory Visit: Payer: BC Managed Care – PPO | Admitting: Internal Medicine

## 2021-09-16 DIAGNOSIS — M41125 Adolescent idiopathic scoliosis, thoracolumbar region: Secondary | ICD-10-CM | POA: Diagnosis not present

## 2021-09-16 DIAGNOSIS — M9905 Segmental and somatic dysfunction of pelvic region: Secondary | ICD-10-CM | POA: Diagnosis not present

## 2021-09-16 DIAGNOSIS — M9903 Segmental and somatic dysfunction of lumbar region: Secondary | ICD-10-CM | POA: Diagnosis not present

## 2021-09-16 DIAGNOSIS — M5417 Radiculopathy, lumbosacral region: Secondary | ICD-10-CM | POA: Diagnosis not present

## 2021-09-23 ENCOUNTER — Ambulatory Visit: Payer: BC Managed Care – PPO | Admitting: Internal Medicine

## 2021-09-30 ENCOUNTER — Encounter: Payer: Self-pay | Admitting: Internal Medicine

## 2021-09-30 ENCOUNTER — Ambulatory Visit: Payer: BC Managed Care – PPO | Admitting: Internal Medicine

## 2021-09-30 DIAGNOSIS — I471 Supraventricular tachycardia: Secondary | ICD-10-CM | POA: Diagnosis not present

## 2021-09-30 DIAGNOSIS — K219 Gastro-esophageal reflux disease without esophagitis: Secondary | ICD-10-CM

## 2021-09-30 DIAGNOSIS — F439 Reaction to severe stress, unspecified: Secondary | ICD-10-CM | POA: Diagnosis not present

## 2021-09-30 MED ORDER — SERTRALINE HCL 50 MG PO TABS: ORAL_TABLET | ORAL | 2 refills | Status: DC

## 2021-09-30 NOTE — Progress Notes (Signed)
Patient ID: Theresa Buck, female   DOB: [birth year], 66 y.o.   MRN: [id number]   Subjective:    Patient ID: Theresa Buck, female    DOB: 1956-01-28, 66 y.o.   MRN: [id number]   Patient here for a scheduled follow up.    HPI Here to follow up regarding increased stress, anxiety and SVT. Discussed stress - work and family.  Discussed medication.  Feels needs something to help level things out.  Discussed treatment options.  Also with history of SVT.  S/p ablation. Per last visit 08/26/21 - minimal breakthrough arrhythmia.  Recommended continue metoprolol '50mg'$  q day.  Blood pressures per her report are running 100/70s.  No headache or light headedness.  No chest pain.  Breathing stable.  Eating.  No abdominal pain or bowel change reported.     Past Medical History:  Diagnosis Date   Allergy    recurring sinus problems   Anxiety    Arthritis    Chronic back pain    Deviated septum    Deviated septum    GERD (gastroesophageal reflux disease)    H/O sinusitis    Headache    H/O MIGRAINES   Hx of degenerative disc disease    Scoliosis    SVT (supraventricular tachycardia) ([place])    Past Surgical History:  Procedure Laterality Date   BACK SURGERY  09/26/2013   LUMBAR   COLONOSCOPY  08/03/2015   Dr Vira Agar   HEMORRHOID SURGERY N/A 03/13/2017   Procedure: HEMORRHOIDECTOMY;  Surgeon: Christene Lye, MD;  Location: [facility];  Service: General;  Laterality: N/A;   SVT ABLATION N/A 09/16/2020   Procedure: SVT ABLATION;  Surgeon: Vickie Epley, MD;  Location: [place] [facility];  Service: Cardiovascular;  Laterality: N/A;   UPPER GI ENDOSCOPY  08/03/2015   Dr Vira Agar   Family History  Problem Relation Age of Onset   Brain cancer Mother    ALS Father    Diabetes Brother        multiple   Heart disease Son        myocardial infarction, s/p CABG   Throat cancer Brother    Breast cancer Neg Hx    Social History   Socioeconomic History   Marital status: Widowed     Spouse name: Not on file   Number of children: 3   Years of education: Not on file   Highest education level: Not on file  Occupational History   Not on file  Tobacco Use   Smoking status: Never   Smokeless tobacco: Never  Vaping Use   Vaping Use: Never used  Substance and Sexual Activity   Alcohol use: No    Alcohol/week: 0.0 standard drinks of alcohol   Drug use: No   Sexual activity: Not on file  Other Topics Concern   Not on file  Social History Narrative   Not on file   Social Determinants of Health   Financial Resource Strain: Not on file  Food Insecurity: Not on file  Transportation Needs: Not on file  Physical Activity: Not on file  Stress: Not on file  Social Connections: Not on file     Review of Systems  Constitutional:  Negative for appetite change and unexpected weight change.  HENT:  Negative for congestion and sinus pressure.   Respiratory:  Negative for cough, chest tightness and shortness of breath.   Cardiovascular:  Negative for chest pain, palpitations and leg swelling.  Gastrointestinal:  Negative for  abdominal pain, diarrhea, nausea and vomiting.  Genitourinary:  Negative for difficulty urinating and dysuria.  Musculoskeletal:  Negative for joint swelling and myalgias.  Skin:  Negative for color change and rash.  Neurological:  Negative for dizziness, light-headedness and headaches.  Psychiatric/Behavioral:  Negative for agitation and dysphoric mood.        Objective:     BP 110/72 (BP Location: Right Arm, Patient Position: Sitting, Cuff Size: Small)   Pulse 68   Temp 98.1 F (36.7 C) (Temporal)   Resp 14   Ht '5\' 6"'$  (1.676 m)   Wt 143 lb 12.8 oz (65.2 kg)   LMP 07/15/2012   SpO2 97%   BMI 23.21 kg/m  Wt Readings from Last 3 Encounters:  09/30/21 143 lb 12.8 oz (65.2 kg)  08/26/21 141 lb 8 oz (64.2 kg)  07/11/21 143 lb (64.9 kg)    Physical Exam Vitals reviewed.  Constitutional:      General: She is not in acute distress.     Appearance: Normal appearance.  HENT:     Head: Normocephalic and atraumatic.     Right Ear: External ear normal.     Left Ear: External ear normal.  Eyes:     General: No scleral icterus.       Right eye: No discharge.        Left eye: No discharge.     Conjunctiva/sclera: Conjunctivae normal.  Neck:     Thyroid: No thyromegaly.  Cardiovascular:     Rate and Rhythm: Normal rate and regular rhythm.  Pulmonary:     Effort: No respiratory distress.     Breath sounds: Normal breath sounds. No wheezing.  Abdominal:     General: Bowel sounds are normal.     Palpations: Abdomen is soft.     Tenderness: There is no abdominal tenderness.  Musculoskeletal:        General: No swelling or tenderness.     Cervical back: Neck supple. No tenderness.  Lymphadenopathy:     Cervical: No cervical adenopathy.  Skin:    Findings: No erythema or rash.  Neurological:     Mental Status: She is alert.  Psychiatric:        Mood and Affect: Mood normal.        Behavior: Behavior normal.      Outpatient Encounter Medications as of 09/30/2021  Medication Sig   Biotin 5000 MCG TABS Take 5,000 mcg by mouth daily.   Calcium Carb-Cholecalciferol (CALCIUM + D3 PO) Take 1 tablet daily by mouth.   carboxymethylcellulose (REFRESH PLUS) 0.5 % SOLN Place 1 drop into both eyes 3 (three) times daily as needed (dry eyes).   estradiol (ESTRACE) 0.1 MG/GM vaginal cream Place 1 Applicatorful vaginally daily.   estradiol (ESTRACE) 1 MG tablet TAKE 1/2 TABLET(0.5 MG) BY MOUTH DAILY   fluconazole (DIFLUCAN) 150 MG tablet Take one tablet x 1 day.  May repeat x 1 if symptoms persists or do not resolve.   hydrocortisone (ANUSOL-HC) 25 MG suppository Place 1 suppository (25 mg total) rectally 2 (two) times daily.   metoprolol succinate (TOPROL-XL) 50 MG 24 hr tablet Take 1 tablet (50 mg total) by mouth daily. Take with or immediately following a meal.   montelukast (SINGULAIR) 10 MG tablet Take 10 mg at bedtime by mouth.     Multiple Vitamin (MULTIVITAMIN) tablet Take 1 tablet by mouth daily.   pantoprazole (PROTONIX) 40 MG tablet TAKE 1 TABLET BY MOUTH EVERY DAY   predniSONE (STERAPRED UNI-PAK  21 TAB) 10 MG (21) TBPK tablet Take by mouth daily. As directed   Probiotic Product (PROBIOTIC ADVANCED PO) Take 1 capsule daily by mouth.    progesterone (PROMETRIUM) 100 MG capsule Take 100 mg daily by mouth.   sertraline (ZOLOFT) 50 MG tablet 1/2 tablet q day for 2 weeks and then increase to one po q day.   vitamin C (ASCORBIC ACID) 500 MG tablet Take 500 mg by mouth daily.   [DISCONTINUED] traZODone (DESYREL) 50 MG tablet Take 0.5-1 tablets (25-50 mg total) by mouth at bedtime as needed for sleep. take 1/2 to 1 tablets by mouth at bedtime if needed for sleep   No facility-administered encounter medications on file as of 09/30/2021.     Lab Results  Component Value Date   WBC 6.0 05/16/2021   HGB 14.6 05/16/2021   HCT 43.2 05/16/2021   PLT 219 05/16/2021   GLUCOSE 104 (H) 07/14/2021   CHOL 192 07/14/2021   TRIG 77.0 07/14/2021   HDL 53.90 07/14/2021   LDLCALC 123 (H) 07/14/2021   ALT 18 07/14/2021   AST 16 07/14/2021   NA 141 07/14/2021   K 4.2 07/14/2021   CL 103 07/14/2021   CREATININE 0.80 07/14/2021   BUN 17 07/14/2021   CO2 33 (H) 07/14/2021   TSH 1.98 07/14/2021       Assessment & Plan:   Problem List Items Addressed This Visit     Gastro-esophageal reflux disease without esophagitis    No upper symptoms reported. On protonix.        Stress    Increased stress. Discussed.  Family and work stress.  Discussed treatment options.  Discussed starting zoloft.  zoloft as directed.  Get her back in soon to reassess.        Supraventricular tachycardia ([place])    S/p ablation.  On metoprolol '50mg'$  q day. Overall doing better.  Follow. Continue f/u with Dr Quentin Ore.         Einar Pheasant, MD

## 2021-10-06 DIAGNOSIS — N771 Vaginitis, vulvitis and vulvovaginitis in diseases classified elsewhere: Secondary | ICD-10-CM | POA: Diagnosis not present

## 2021-10-06 DIAGNOSIS — B3731 Acute candidiasis of vulva and vagina: Secondary | ICD-10-CM | POA: Diagnosis not present

## 2021-10-09 ENCOUNTER — Encounter: Payer: Self-pay | Admitting: Internal Medicine

## 2021-10-09 NOTE — Assessment & Plan Note (Signed)
S/p ablation.  On metoprolol '50mg'$  q day. Overall doing better.  Follow. Continue f/u with Dr Quentin Ore.

## 2021-10-09 NOTE — Assessment & Plan Note (Signed)
Increased stress. Discussed.  Family and work stress.  Discussed treatment options.  Discussed starting zoloft.  zoloft as directed.  Get her back in soon to reassess.

## 2021-10-09 NOTE — Assessment & Plan Note (Signed)
No upper symptoms reported.  On protonix.   

## 2021-10-12 ENCOUNTER — Other Ambulatory Visit: Payer: Self-pay

## 2021-10-12 ENCOUNTER — Inpatient Hospital Stay
Admission: EM | Admit: 2021-10-12 | Discharge: 2021-10-15 | DRG: 522 | Disposition: A | Discharge status: Home / Self Care | Payer: BC Managed Care – PPO | Attending: Internal Medicine | Admitting: Internal Medicine

## 2021-10-12 ENCOUNTER — Encounter: Payer: Self-pay | Admitting: Emergency Medicine

## 2021-10-12 ENCOUNTER — Emergency Department: Payer: BC Managed Care – PPO

## 2021-10-12 DIAGNOSIS — Z01818 Encounter for other preprocedural examination: Secondary | ICD-10-CM | POA: Diagnosis not present

## 2021-10-12 DIAGNOSIS — M549 Dorsalgia, unspecified: Secondary | ICD-10-CM | POA: Diagnosis not present

## 2021-10-12 DIAGNOSIS — I471 Supraventricular tachycardia, unspecified: Secondary | ICD-10-CM | POA: Diagnosis present

## 2021-10-12 DIAGNOSIS — S72002S Fracture of unspecified part of neck of left femur, sequela: Secondary | ICD-10-CM | POA: Diagnosis not present

## 2021-10-12 DIAGNOSIS — S72142A Displaced intertrochanteric fracture of left femur, initial encounter for closed fracture: Secondary | ICD-10-CM | POA: Diagnosis not present

## 2021-10-12 DIAGNOSIS — S72009A Fracture of unspecified part of neck of unspecified femur, initial encounter for closed fracture: Secondary | ICD-10-CM | POA: Diagnosis not present

## 2021-10-12 DIAGNOSIS — F32A Depression, unspecified: Secondary | ICD-10-CM | POA: Diagnosis not present

## 2021-10-12 DIAGNOSIS — G8929 Other chronic pain: Secondary | ICD-10-CM | POA: Diagnosis not present

## 2021-10-12 DIAGNOSIS — M1612 Unilateral primary osteoarthritis, left hip: Secondary | ICD-10-CM | POA: Diagnosis present

## 2021-10-12 DIAGNOSIS — R55 Syncope and collapse: Secondary | ICD-10-CM | POA: Diagnosis not present

## 2021-10-12 DIAGNOSIS — Z96642 Presence of left artificial hip joint: Secondary | ICD-10-CM | POA: Diagnosis not present

## 2021-10-12 DIAGNOSIS — Z79899 Other long term (current) drug therapy: Secondary | ICD-10-CM

## 2021-10-12 DIAGNOSIS — M25559 Pain in unspecified hip: Secondary | ICD-10-CM | POA: Diagnosis not present

## 2021-10-12 DIAGNOSIS — Z043 Encounter for examination and observation following other accident: Secondary | ICD-10-CM | POA: Diagnosis not present

## 2021-10-12 DIAGNOSIS — Z471 Aftercare following joint replacement surgery: Secondary | ICD-10-CM | POA: Diagnosis not present

## 2021-10-12 DIAGNOSIS — Z79890 Hormone replacement therapy (postmenopausal): Secondary | ICD-10-CM

## 2021-10-12 DIAGNOSIS — R5381 Other malaise: Secondary | ICD-10-CM | POA: Diagnosis not present

## 2021-10-12 DIAGNOSIS — F329 Major depressive disorder, single episode, unspecified: Secondary | ICD-10-CM

## 2021-10-12 DIAGNOSIS — S72002A Fracture of unspecified part of neck of left femur, initial encounter for closed fracture: Principal | ICD-10-CM

## 2021-10-12 DIAGNOSIS — Z881 Allergy status to other antibiotic agents status: Secondary | ICD-10-CM | POA: Diagnosis not present

## 2021-10-12 DIAGNOSIS — R001 Bradycardia, unspecified: Secondary | ICD-10-CM | POA: Diagnosis not present

## 2021-10-12 DIAGNOSIS — W19XXXA Unspecified fall, initial encounter: Principal | ICD-10-CM

## 2021-10-12 DIAGNOSIS — R110 Nausea: Secondary | ICD-10-CM | POA: Diagnosis not present

## 2021-10-12 DIAGNOSIS — I447 Left bundle-branch block, unspecified: Secondary | ICD-10-CM | POA: Diagnosis present

## 2021-10-12 DIAGNOSIS — W010XXA Fall on same level from slipping, tripping and stumbling without subsequent striking against object, initial encounter: Secondary | ICD-10-CM | POA: Diagnosis not present

## 2021-10-12 DIAGNOSIS — M25552 Pain in left hip: Secondary | ICD-10-CM | POA: Diagnosis not present

## 2021-10-12 DIAGNOSIS — K219 Gastro-esophageal reflux disease without esophagitis: Secondary | ICD-10-CM | POA: Diagnosis present

## 2021-10-12 DIAGNOSIS — S0990XA Unspecified injury of head, initial encounter: Secondary | ICD-10-CM | POA: Diagnosis not present

## 2021-10-12 DIAGNOSIS — F419 Anxiety disorder, unspecified: Secondary | ICD-10-CM | POA: Diagnosis present

## 2021-10-12 DIAGNOSIS — M4312 Spondylolisthesis, cervical region: Secondary | ICD-10-CM | POA: Diagnosis not present

## 2021-10-12 LAB — CBC WITH DIFFERENTIAL/PLATELET
Abs Immature Granulocytes: 0.08 10*3/uL — ABNORMAL HIGH (ref 0.00–0.07)
Basophils Absolute: 0 10*3/uL (ref 0.0–0.1)
Basophils Relative: 1 %
Eosinophils Absolute: 0.2 10*3/uL (ref 0.0–0.5)
Eosinophils Relative: 2 %
HCT: 43.3 % (ref 36.0–46.0)
Hemoglobin: 14.3 g/dL (ref 12.0–15.0)
Immature Granulocytes: 1 %
Lymphocytes Relative: 26 %
Lymphs Abs: 1.9 10*3/uL (ref 0.7–4.0)
MCH: 31.9 pg (ref 26.0–34.0)
MCHC: 33 g/dL (ref 30.0–36.0)
MCV: 96.7 fL (ref 80.0–100.0)
Monocytes Absolute: 0.5 10*3/uL (ref 0.1–1.0)
Monocytes Relative: 7 %
Neutro Abs: 4.6 10*3/uL (ref 1.7–7.7)
Neutrophils Relative %: 63 %
Platelets: 227 10*3/uL (ref 150–400)
RBC: 4.48 MIL/uL (ref 3.87–5.11)
RDW: 11.8 % (ref 11.5–15.5)
WBC: 7.2 10*3/uL (ref 4.0–10.5)
nRBC: 0 % (ref 0.0–0.2)

## 2021-10-12 LAB — PROTIME-INR
INR: 1 (ref 0.8–1.2)
Prothrombin Time: 13.5 seconds (ref 11.4–15.2)

## 2021-10-12 LAB — SURGICAL PCR SCREEN
MRSA, PCR: NEGATIVE
Staphylococcus aureus: NEGATIVE

## 2021-10-12 LAB — COMPREHENSIVE METABOLIC PANEL
ALT: 18 U/L (ref 0–44)
AST: 20 U/L (ref 15–41)
Albumin: 4.1 g/dL (ref 3.5–5.0)
Alkaline Phosphatase: 65 U/L (ref 38–126)
Anion gap: 5 (ref 5–15)
BUN: 17 mg/dL (ref 8–23)
CO2: 28 mmol/L (ref 22–32)
Calcium: 9.3 mg/dL (ref 8.9–10.3)
Chloride: 107 mmol/L (ref 98–111)
Creatinine, Ser: 0.67 mg/dL (ref 0.44–1.00)
GFR, Estimated: >60 mL/min (ref >60)
Glucose, Bld: 121 mg/dL — ABNORMAL HIGH (ref 70–99)
Potassium: 3.9 mmol/L (ref 3.5–5.1)
Sodium: 140 mmol/L (ref 135–145)
Total Bilirubin: 0.6 mg/dL (ref 0.3–1.2)
Total Protein: 7.3 g/dL (ref 6.5–8.1)

## 2021-10-12 LAB — APTT: aPTT: 31 seconds (ref 24–36)

## 2021-10-12 LAB — TYPE AND SCREEN
ABO/RH(D): A POS
Antibody Screen: NEGATIVE

## 2021-10-12 LAB — TROPONIN I (HIGH SENSITIVITY)
Troponin I (High Sensitivity): 3 ng/L (ref <18)
Troponin I (High Sensitivity): 3 ng/L (ref <18)

## 2021-10-12 MED ORDER — POLYVINYL ALCOHOL 1.4 % OP SOLN
1.0000 [drp] | Freq: Three times a day (TID) | OPHTHALMIC | Status: DC | PRN
Start: 2021-10-12 — End: 2021-10-15

## 2021-10-12 MED ORDER — PANTOPRAZOLE SODIUM 40 MG PO TBEC
40.0000 mg | DELAYED_RELEASE_TABLET | Freq: Every day | ORAL | Status: DC
  Administered 2021-10-12 – 2021-10-15 (×4): 40 mg via ORAL
  Filled 2021-10-12 (×4): qty 1

## 2021-10-12 MED ORDER — MORPHINE SULFATE (PF) 2 MG/ML IV SOLN
0.5000 mg | INTRAVENOUS | Status: DC | PRN
  Administered 2021-10-12 – 2021-10-15 (×10): 0.5 mg via INTRAVENOUS
  Filled 2021-10-12 (×10): qty 1

## 2021-10-12 MED ORDER — ADULT MULTIVITAMIN W/MINERALS CH
1.0000 | ORAL_TABLET | Freq: Every day | ORAL | Status: DC
  Administered 2021-10-12: 1 via ORAL
  Filled 2021-10-12 (×3): qty 1

## 2021-10-12 MED ORDER — CEFAZOLIN SODIUM-DEXTROSE 1-4 GM/50ML-% IV SOLN
1.0000 g | INTRAVENOUS | Status: AC
Start: 2021-10-13 — End: 2021-10-13
  Administered 2021-10-13: 1 g via INTRAVENOUS
  Filled 2021-10-12: qty 50

## 2021-10-12 MED ORDER — RISAQUAD PO CAPS
1.0000 | ORAL_CAPSULE | Freq: Every day | ORAL | Status: DC
  Administered 2021-10-12: 1 via ORAL
  Filled 2021-10-12 (×3): qty 1

## 2021-10-12 MED ORDER — METOPROLOL SUCCINATE ER 50 MG PO TB24
50.0000 mg | ORAL_TABLET | Freq: Every day | ORAL | Status: DC
  Administered 2021-10-14 – 2021-10-15 (×2): 50 mg via ORAL
  Filled 2021-10-12 (×4): qty 1

## 2021-10-12 MED ORDER — SENNA 8.6 MG PO TABS
1.0000 | ORAL_TABLET | Freq: Two times a day (BID) | ORAL | Status: DC
  Administered 2021-10-12 – 2021-10-15 (×5): 8.6 mg via ORAL
  Filled 2021-10-12 (×6): qty 1

## 2021-10-12 MED ORDER — METHOCARBAMOL 1000 MG/10ML IJ SOLN: 500.0000 mg | Freq: Four times a day (QID) | INTRAVENOUS | Status: DC | PRN

## 2021-10-12 MED ORDER — MUPIROCIN 2 % EX OINT
1.0000 | TOPICAL_OINTMENT | Freq: Two times a day (BID) | CUTANEOUS | Status: DC
  Administered 2021-10-13 – 2021-10-15 (×3): 1 via NASAL
  Filled 2021-10-12: qty 22

## 2021-10-12 MED ORDER — HYDROCODONE-ACETAMINOPHEN 5-325 MG PO TABS
1.0000 | ORAL_TABLET | Freq: Four times a day (QID) | ORAL | Status: DC | PRN
  Administered 2021-10-12: 1 via ORAL
  Administered 2021-10-12: 2 via ORAL
  Administered 2021-10-13: 1 via ORAL
  Administered 2021-10-13 – 2021-10-15 (×7): 2 via ORAL
  Filled 2021-10-12 (×7): qty 2
  Filled 2021-10-12: qty 1
  Filled 2021-10-12 (×2): qty 2
  Filled 2021-10-12: qty 1

## 2021-10-12 MED ORDER — MONTELUKAST SODIUM 10 MG PO TABS
10.0000 mg | ORAL_TABLET | Freq: Every day | ORAL | Status: DC
  Administered 2021-10-12 – 2021-10-14 (×3): 10 mg via ORAL
  Filled 2021-10-12 (×3): qty 1

## 2021-10-12 MED ORDER — ESTRADIOL 1 MG PO TABS
0.5000 mg | ORAL_TABLET | Freq: Every day | ORAL | Status: DC
  Administered 2021-10-12: 0.5 mg via ORAL
  Filled 2021-10-12 (×3): qty 0.5

## 2021-10-12 MED ORDER — CEFAZOLIN (ANCEF) 1 G IV SOLR
1.0000 g | INTRAVENOUS | Status: DC
Start: 2021-10-13 — End: 2021-10-12

## 2021-10-12 MED ORDER — SERTRALINE HCL 50 MG PO TABS
50.0000 mg | ORAL_TABLET | Freq: Every day | ORAL | Status: DC
  Filled 2021-10-12 (×3): qty 1

## 2021-10-12 MED ORDER — METHOCARBAMOL 500 MG PO TABS
500.0000 mg | ORAL_TABLET | Freq: Four times a day (QID) | ORAL | Status: DC | PRN
  Administered 2021-10-12 – 2021-10-14 (×6): 500 mg via ORAL
  Filled 2021-10-12 (×6): qty 1

## 2021-10-12 MED ORDER — FENTANYL CITRATE PF 50 MCG/ML IJ SOSY
50.0000 ug | PREFILLED_SYRINGE | Freq: Once | INTRAMUSCULAR | Status: AC
  Administered 2021-10-12: 50 ug via INTRAVENOUS
  Filled 2021-10-12: qty 1

## 2021-10-12 MED ORDER — HYDROMORPHONE HCL 1 MG/ML IJ SOLN
0.5000 mg | Freq: Once | INTRAMUSCULAR | Status: AC
  Administered 2021-10-12: 0.5 mg via INTRAVENOUS
  Filled 2021-10-12: qty 0.5

## 2021-10-12 NOTE — Progress Notes (Signed)
Admission profile updated. ?

## 2021-10-12 NOTE — Plan of Care (Signed)

## 2021-10-12 NOTE — Consult Note (Signed)
Reason for Consult: Displaced femoral neck fracture left Referring Physician: Dr. Robin Searing is an 66 y.o. female.  HPI: Patient is a 66 year old who is getting dressed this morning and stumbled falling and injuring her left hip.  She was unable to bear weight and was brought to the emergency room and found to have a displaced femoral neck fracture.  She normally is quite active working at a [employer] in [place] and has to climb 3 flights of stairs to get to the office.  She also is able to go up and down stairs at home without difficulty normally and is otherwise community ambulator without assistive device.  She denies prodromal symptoms although she did have a hip injury in the past.  No prior hip surgery or fracture.  Past Medical History:  Diagnosis Date   Allergy    recurring sinus problems   Anxiety    Arthritis    Chronic back pain    Deviated septum    Deviated septum    GERD (gastroesophageal reflux disease)    H/O sinusitis    Headache    H/O MIGRAINES   Hx of degenerative disc disease    Scoliosis    SVT (supraventricular tachycardia) ([place])     Past Surgical History:  Procedure Laterality Date   BACK SURGERY  09/26/2013   LUMBAR   COLONOSCOPY  08/03/2015   Dr Vira Agar   HEMORRHOID SURGERY N/A 03/13/2017   Procedure: HEMORRHOIDECTOMY;  Surgeon: Christene Lye, MD;  Location: [facility] ORS;  Service: General;  Laterality: N/A;   SVT ABLATION N/A 09/16/2020   Procedure: SVT ABLATION;  Surgeon: Vickie Epley, MD;  Location: [place] [facility];  Service: Cardiovascular;  Laterality: N/A;   UPPER GI ENDOSCOPY  08/03/2015   Dr Vira Agar    Family History  Problem Relation Age of Onset   Brain cancer Mother    ALS Father    Diabetes Brother        multiple   Heart disease Son        myocardial infarction, s/p CABG   Throat cancer Brother    Breast cancer Neg Hx     Social History:  reports that she has never smoked. She has never used  smokeless tobacco. She reports that she does not drink alcohol and does not use drugs.  Allergies:  Allergies  Allergen Reactions   Ceftin [Cefuroxime Axetil] Rash    Medications: I have reviewed the patient's current medications.  Results for orders placed or performed during the hospital encounter of 10/12/21 (from the past 48 hour(s))  CBC with Differential     Status: Abnormal   Collection Time: 10/12/21  7:25 AM  Result Value Ref Range   WBC 7.2 4.0 - 10.5 K/uL   RBC 4.48 3.87 - 5.11 MIL/uL   Hemoglobin 14.3 12.0 - 15.0 g/dL   HCT 43.3 36.0 - 46.0 %   MCV 96.7 80.0 - 100.0 fL   MCH 31.9 26.0 - 34.0 pg   MCHC 33.0 30.0 - 36.0 g/dL   RDW 11.8 11.5 - 15.5 %   Platelets 227 150 - 400 K/uL   nRBC 0.0 0.0 - 0.2 %   Neutrophils Relative % 63 %   Neutro Abs 4.6 1.7 - 7.7 K/uL   Lymphocytes Relative 26 %   Lymphs Abs 1.9 0.7 - 4.0 K/uL   Monocytes Relative 7 %   Monocytes Absolute 0.5 0.1 - 1.0 K/uL   Eosinophils Relative  2 %   Eosinophils Absolute 0.2 0.0 - 0.5 K/uL   Basophils Relative 1 %   Basophils Absolute 0.0 0.0 - 0.1 K/uL   Immature Granulocytes 1 %   Abs Immature Granulocytes 0.08 (H) 0.00 - 0.07 K/uL    Comment: Performed at [facility], [address] [place], [place] [address]  Comprehensive metabolic panel     Status: Abnormal   Collection Time: 10/12/21  7:25 AM  Result Value Ref Range   Sodium 140 135 - 145 mmol/L   Potassium 3.9 3.5 - 5.1 mmol/L   Chloride 107 98 - 111 mmol/L   CO2 28 22 - 32 mmol/L   Glucose, Bld 121 (H) 70 - 99 mg/dL    Comment: Glucose reference range applies only to samples taken after fasting for at least 8 hours.   BUN 17 8 - 23 mg/dL   Creatinine, Ser 0.67 0.44 - 1.00 mg/dL   Calcium 9.3 8.9 - 10.3 mg/dL   Total Protein 7.3 6.5 - 8.1 g/dL   Albumin 4.1 3.5 - 5.0 g/dL   AST 20 15 - 41 U/L   ALT 18 0 - 44 U/L   Alkaline Phosphatase 65 38 - 126 U/L   Total Bilirubin 0.6 0.3 - 1.2 mg/dL   GFR, Estimated >60 >60 mL/min     Comment: (NOTE) Calculated using the CKD-EPI Creatinine Equation (2021)    Anion gap 5 5 - 15    Comment: Performed at [facility], [address] [place], [place] [address]  Protime-INR     Status: None   Collection Time: 10/12/21  7:25 AM  Result Value Ref Range   Prothrombin Time 13.5 11.4 - 15.2 seconds   INR 1.0 0.8 - 1.2    Comment: (NOTE) INR goal varies based on device and disease states. Performed at [facility], [address] [place], [place] [address]   APTT     Status: None   Collection Time: 10/12/21  7:25 AM  Result Value Ref Range   aPTT 31 24 - 36 seconds    Comment: Performed at [facility], [place], [place] [address]  Troponin I (High Sensitivity)     Status: None   Collection Time: 10/12/21  7:25 AM  Result Value Ref Range   Troponin I (High Sensitivity) 3 <18 ng/L    Comment: (NOTE) Elevated high sensitivity troponin I (hsTnI) values and significant  changes across serial measurements may suggest ACS but many other  chronic and acute conditions are known to elevate hsTnI results.  Refer to the "Links" section for chest pain algorithms and additional  guidance. Performed at [facility], [place], [place] [address]   Troponin I (High Sensitivity)     Status: None   Collection Time: 10/12/21 10:22 AM  Result Value Ref Range   Troponin I (High Sensitivity) 3 <18 ng/L    Comment: (NOTE) Elevated high sensitivity troponin I (hsTnI) values and significant  changes across serial measurements may suggest ACS but many other  chronic and acute conditions are known to elevate hsTnI results.  Refer to the "Links" section for chest pain algorithms and additional  guidance. Performed at [facility], [address] [place], [place] [address]     CT HEAD WO CONTRAST (5MM)  Result Date: 10/12/2021 CLINICAL DATA:  [place] at home, neck trauma, shortening of LEFT leg, denies  loss of consciousness and head injury EXAM: CT HEAD WITHOUT  CONTRAST CT CERVICAL SPINE WITHOUT CONTRAST TECHNIQUE: Multidetector CT imaging of the head and cervical spine was performed following the standard protocol without intravenous contrast. Multiplanar CT image reconstructions of the cervical spine were also generated. RADIATION DOSE REDUCTION: This exam was performed according to the departmental dose-optimization program which includes automated exposure control, adjustment of the mA and/or kV according to patient size and/or use of iterative reconstruction technique. COMPARISON:  None FINDINGS: CT HEAD FINDINGS Brain: Normal ventricular morphology. No midline shift or mass effect. Normal appearance of brain parenchyma. No intracranial hemorrhage, mass lesion, evidence of acute infarction, or extra-axial fluid collection. Vascular: No hyperdense vessels Skull: Intact Sinuses/Orbits: Clear Other: N/A CT CERVICAL SPINE FINDINGS Alignment: Minimal retrolisthesis at C4-C5 and C5-C6. Remaining alignments normal. Skull base and vertebrae: Vertebral body heights maintained. Osseous mineralization normal. Skull base intact. Disc space narrowing with endplate spur formation and sclerosis at C4-C5 and C5-C6, minimally C6-C7. No fracture, additional subluxation, or bone destruction. Uncovertebral spurs encroach upon cervical neural foramina bilaterally at C4-C5 through C6-C7. Soft tissues and spinal canal: Prevertebral soft tissues normal thickness. Disc levels:  No additional abnormalities Upper chest: Minimal biapical scarring Other: N/A IMPRESSION: No acute intracranial abnormalities. Degenerative disc disease changes and uncovertebral degenerative changes at C4-C5 through C6-C7 as above. No acute cervical spine abnormalities. Electronically Signed   By: Lavonia Dana M.D.   On: 10/12/2021 08:27   CT Cervical Spine Wo Contrast  Result Date: 10/12/2021 CLINICAL DATA:  [place] at home, neck trauma, shortening of LEFT  leg, denies loss of consciousness and head injury EXAM: CT HEAD WITHOUT CONTRAST CT CERVICAL SPINE WITHOUT CONTRAST TECHNIQUE: Multidetector CT imaging of the head and cervical spine was performed following the standard protocol without intravenous contrast. Multiplanar CT image reconstructions of the cervical spine were also generated. RADIATION DOSE REDUCTION: This exam was performed according to the departmental dose-optimization program which includes automated exposure control, adjustment of the mA and/or kV according to patient size and/or use of iterative reconstruction technique. COMPARISON:  None FINDINGS: CT HEAD FINDINGS Brain: Normal ventricular morphology. No midline shift or mass effect. Normal appearance of brain parenchyma. No intracranial hemorrhage, mass lesion, evidence of acute infarction, or extra-axial fluid collection. Vascular: No hyperdense vessels Skull: Intact Sinuses/Orbits: Clear Other: N/A CT CERVICAL SPINE FINDINGS Alignment: Minimal retrolisthesis at C4-C5 and C5-C6. Remaining alignments normal. Skull base and vertebrae: Vertebral body heights maintained. Osseous mineralization normal. Skull base intact. Disc space narrowing with endplate spur formation and sclerosis at C4-C5 and C5-C6, minimally C6-C7. No fracture, additional subluxation, or bone destruction. Uncovertebral spurs encroach upon cervical neural foramina bilaterally at C4-C5 through C6-C7. Soft tissues and spinal canal: Prevertebral soft tissues normal thickness. Disc levels:  No additional abnormalities Upper chest: Minimal biapical scarring Other: N/A IMPRESSION: No acute intracranial abnormalities. Degenerative disc disease changes and uncovertebral degenerative changes at C4-C5 through C6-C7 as above. No acute cervical spine abnormalities. Electronically Signed   By: Lavonia Dana M.D.   On: 10/12/2021 08:27   DG Chest Portable 1 View  Result Date: 10/12/2021 CLINICAL DATA:  [place].  Left hip fracture. EXAM: PORTABLE  CHEST 1 VIEW COMPARISON:  05/16/2021 FINDINGS: The cardiac silhouette, mediastinal and hilar contours are normal. The lungs are clear. No pleural effusions. No pulmonary lesions. The bony thorax is intact. IMPRESSION: No acute cardiopulmonary findings. Electronically Signed   By: Marijo Sanes M.D.   On: 10/12/2021 08:09   DG Hip Unilat W or Wo Pelvis 2-3 Views Left  Result Date: 10/12/2021 CLINICAL  DATA:  Fell.  Left hip pain. EXAM: DG HIP (WITH OR WITHOUT PELVIS) 2-3V LEFT COMPARISON:  None Available. FINDINGS: There is a displaced left femoral neck fracture on the left side. The right hip is intact. The pubic symphysis and SI joints are intact. No pelvic fractures. IMPRESSION: Displaced left femoral neck fracture. Electronically Signed   By: Marijo Sanes M.D.   On: 10/12/2021 08:08    Review of Systems Blood pressure (!) 135/91, pulse 69, temperature 97.7 F (36.5 C), temperature source Oral, resp. rate 11, height 5' 6.5" (1.689 m), weight 64 kg, last menstrual period 07/15/2012, SpO2 99 %. Physical Exam The left leg is shortened and externally rotated with intact pulses and sensation.  Skin around the hip is intact with no ecchymosis swelling or bruising. Assessment/Plan: Displaced femoral neck fracture with mild hip arthritis and a 66 year old who is quite active.  Recommendation is for hip replacement as a hemiarthroplasty is likely to lead to significant subsequent groin pain from arthritis and revision surgery.  Explained to patient with her activity level anterior hip approach should allow resumption of normal activities in 6 to 10 weeks.  Risk benefits possible complications of the procedure were discussed along with pressure from the office explaining these in detail.  Hessie Knows 10/12/2021, 12:26 PM

## 2021-10-12 NOTE — Assessment & Plan Note (Signed)
Patient has a history of SVT and is status post ablation Continue metoprolol

## 2021-10-12 NOTE — ED Notes (Signed)
Pt requesting muscle relaxant states that she is having cramps in her L thigh. This RN gave pt Diluadid at this time and explained that she we would reassess her pain after the medication is able to work. Pt verbalized understanding.

## 2021-10-12 NOTE — Assessment & Plan Note (Signed)
Stable.  Continue PPI. 

## 2021-10-12 NOTE — Progress Notes (Signed)
Nutrition Brief Note  RD consulted for assessment of nutritional requirements/ status.   Wt Readings from Last 15 Encounters:  10/12/21 64 kg  09/30/21 65.2 kg  08/26/21 64.2 kg  07/11/21 64.9 kg  06/29/21 64.9 kg  06/10/21 64.6 kg  05/24/21 65.3 kg  03/04/21 63.5 kg  12/10/20 63.3 kg  10/27/20 64.3 kg  09/20/20 62.6 kg  09/16/20 62.6 kg  09/03/20 63 kg  07/28/20 62.6 kg  07/28/20 62.6 kg   Pt with medical history significant for SVT status post ablation, history of GERD, anxiety disorder who presents for evaluation of left hip pain.  Pt admitted with lt femoral neck fracture.   Per orthopedics notes, plan for surgery tomorrow.   Spoke with pt at bedside. She reports good appetite, ate some of her lunch today. She shares she has a set schedule and often consumes small snacks through the day to manage her acid reflux.   Pt denies any weight loss. Reviewed wt hx; wt has been stable over the past 5 months.   Nutrition-Focused physical exam completed. Findings are no fat depletion, no muscle depletion, and no edema.    Medications reviewed and include senokot.   Current diet order is 2 gram sodium (liberalize diet to regular), patient is consuming approximately n/a% of meals at this time. Labs and medications reviewed.   No nutrition interventions warranted at this time. If nutrition issues arise, please consult RD.   Loistine Chance, RD, LDN, [place] Registered Dietitian II Certified Diabetes Care and Education Specialist Please refer to [facility] for RD and/or RD on-call/weekend/after hours pager

## 2021-10-12 NOTE — Assessment & Plan Note (Signed)
-  Stable -Continue Zoloft 

## 2021-10-12 NOTE — Assessment & Plan Note (Signed)
Status post mechanical fall with a femoral neck fracture Immobilize left lower extremity Pain control Place patient on as needed muscle relaxants Orthopedic surgery consult

## 2021-10-12 NOTE — ED Provider Notes (Signed)
[facility] Provider Note    Event Date/Time   First MD Initiated Contact with Patient 10/12/21 [phone]     (approximate)   History   Fall and Hip Pain   HPI  Theresa Buck is a 66 y.o. female with history of SVT, GERD who comes in for a fall patient reports having left hip pain after falling.  Patient was trying to put her shorts on and her foot got caught and she fell to the left leg.  Patient is not on any blood thinners.  She got 100 mcg of fentanyl.  She states that she is not sure if she did it or did not hit her head or if she has any neck pain.  She denies any other injuries though from what she knows.  She denies any chest pain or shortness of breath prior to the fall.  Denies blacking out.      Physical Exam   Triage Vital Signs: Blood pressure 136/85, pulse (!) 59, temperature 97.7 F (36.5 C), temperature source Oral, resp. rate 20, height 5' 6.5" (1.689 m), weight 64 kg, last menstrual period 07/15/2012, SpO2 97 %.   Most recent vital signs: There were no vitals filed for this visit.   General: Awake, no distress.  CV:  Good peripheral perfusion.  Resp:  Normal effort.  Abd:  No distention.  Other:  Left hip shortened and rotated with good distal pulse able to dorsiflex and plantarflex.  Sensation intact.  No other tenderness noted on her extremities.  No chest wall tenderness, no abdominal tenderness.  No obvious injury to the head.  No obvious C-spine tenderness   ED Results / Procedures / Treatments   Labs (all labs ordered are listed, but only abnormal results are displayed) Labs Reviewed  CBC WITH DIFFERENTIAL/PLATELET  COMPREHENSIVE METABOLIC PANEL  PROTIME-INR  APTT  TYPE AND SCREEN     EKG  My interpretation of EKG:  Sinus rate of 80 without any ST elevation, T wave inversion in aVL with left bundle branch block  RADIOLOGY I have reviewed the xray personally and interpreted and patient has a left intertrochanteric  fracture   PROCEDURES:  Critical Care performed: No  .1-3 Lead EKG Interpretation  Performed by: Vanessa [place], MD Authorized by: Vanessa [place], MD     Interpretation: normal     ECG rate:  59   ECG rate assessment: bradycardic     Rhythm: sinus bradycardia     Ectopy: none     Conduction: normal      MEDICATIONS ORDERED IN ED: Medications  fentaNYL (SUBLIMAZE) injection 50 mcg (50 mcg Intravenous Given 10/12/21 0726)  HYDROmorphone (DILAUDID) injection 0.5 mg (0.5 mg Intravenous Given 10/12/21 0844)     IMPRESSION / MDM / [place] / ED COURSE  I reviewed the triage vital signs and the nursing notes.   Patient's presentation is most consistent with acute presentation with potential threat to life or bodily function.   This is concerning for hip dislocation, fracture.  Patient unsure if she hit her head or neck given distracting injury will get CT head, CT cervical.  Differential includes intracranial hemorrhage, cervical fracture  will get preop labs and preop EKG patient given a dose of IV Dilaudid to help with pain  CBC reassuring.  CMP normal creatinine.  Coags normal.  X-ray with femoral neck fracture CTs of the neck and head are negative.  Discussed with Dr. Rudene Christians who will do  the surgery tomorrow syncope n.p.o. at midnight.  Will discuss with the hospital team for admission.  Given the new left bundle branch block she denies any chest pain or shortness of breath but will get troponins and discussed with cardiology to get clearance prior to surgery  The patient is on the cardiac monitor to evaluate for evidence of arrhythmia and/or significant heart rate changes.      FINAL CLINICAL IMPRESSION(S) / ED DIAGNOSES   Final diagnoses:  Fall, initial encounter  Closed fracture of neck of left femur, initial encounter ([place])     Rx / DC Orders   ED Discharge Orders     None        Note:  This document was prepared using Dragon voice recognition  software and may include unintentional dictation errors.   Vanessa [place], MD 10/12/21 [phone]

## 2021-10-12 NOTE — ED Notes (Signed)
Pt transported to CT ?

## 2021-10-12 NOTE — ED Triage Notes (Signed)
Pt via EMS from home. Pt c/o L hip pain after falling. States that she was trying to put her shorts on and her foot got caught in the pants legs and she fell. Shortening noted to the L leg. Denies head injury. Denies LOC. Denies blood thinners. Pt is A&Ox4 and NAD.  161mg of Fentanyl  20 G L hand  73 HR  99% on RA  141/87 10 RR  143 CBG

## 2021-10-12 NOTE — Assessment & Plan Note (Signed)
Twelve-lead EKG shows a left bundle branch block which appears to be a new finding. We will request cardiology consult for preoperative evaluation

## 2021-10-12 NOTE — Consult Note (Signed)
Cardiology Consultation:   Patient ID: Theresa Buck MRN: [id number]; DOB: 1955/12/31  Admit date: 10/12/2021 Date of Consult: 10/12/2021  PCP:  Theresa Pheasant, Buck   [facility] Providers Cardiologist:  Theresa Rogue, Buck  Electrophysiologist:  Theresa Buck       Patient Profile:   Theresa Buck is a 66 y.o. female with a hx of SVT status post ablation, AVNRT, palpitations, HFmrEF, NICM, GERD, and anxiety disorder who is being seen 10/12/2021 for the evaluation of left hip fracture status post mechanical fall with findings of a new left bundle branch block on EKG at the request of Dr. Jari Buck.  History of Present Illness:   Theresa Buck has a history of SVT status post ablation in 09/16/2020, typical AVNRT, PVCs, HFmrEF, and ICM who was last seen in the office by Theresa Buck on 08/26/2021.  At that time she was having minimal breakthrough arrhythmia status post ablation and was continued on metoprolol succinate 50 mg daily.  She was followed on 05/14/2021 after recent ER visit for palpitations and elevated heart rate with associated jaw pain and suspected SVT.  Symptoms lasted approximately 30 minutes and was relieved with propanolol. She was to continue with her follow-up visits with EP.  Coronary CTA showed no significant CAD. She had been followed in the hospital in March 2022 where EKG documented wide-complex tachycardia concerning for SVT.  She had associated diaphoresis no syncope and it broke on its own.  Echocardiogram was completed with revealed ejection fraction 45% to 50% in March 2022.  Echocardiogram that was completed in 06/13/2021 revealed ejection fraction 55 to 60% which was increased from 45 to 50% in 2022.    This morning she presented to the emergency department via EMS for evaluation of left hip pain.  She states that she was trying to put her shorts on this morning she got her feet tangled up and landed on her left side.  Previously she been having some weakness and  had been engaging in muscle strengthening activities at home. She denied any chest pain, shortness of breath, syncope, or dizziness.  She was unable to bear weight on the left leg due to severe pain and EMS was called.  On arrival to the emergency department she had an EKG that was done which showed a new left bundle branch block.    Initial vital signs: Blood pressure 136/85, pulse 59, respirations 20, temperature 97.7  Labs: Sodium 140, potassium 3.9, chloride 107, CO2 28, glucose 121, BUN 17, creatinine 0.67, WBC 7.2, hemoglobin 14.3, hematocrit 43.3, platelets of 227, high-sensitivity troponin 3  Imaging: DG Hip revealed displaced left femoral neck fracture  Past Medical History:  Diagnosis Date   Allergy    recurring sinus problems   Anxiety    Arthritis    Chronic back pain    Deviated septum    Deviated septum    GERD (gastroesophageal reflux disease)    H/O sinusitis    Headache    H/O MIGRAINES   Hx of degenerative disc disease    Scoliosis    SVT (supraventricular tachycardia) ([place])     Past Surgical History:  Procedure Laterality Date   BACK SURGERY  09/26/2013   LUMBAR   COLONOSCOPY  08/03/2015   Dr Theresa Buck   HEMORRHOID SURGERY N/A 03/13/2017   Procedure: HEMORRHOIDECTOMY;  Surgeon: Theresa Lye, Buck;  Location: [facility] ORS;  Service: General;  Laterality: N/A;   SVT ABLATION N/A 09/16/2020   Procedure: SVT ABLATION;  Surgeon: Theresa Buck;  Location: [place] [facility];  Service: Cardiovascular;  Laterality: N/A;   UPPER GI ENDOSCOPY  08/03/2015   Dr Theresa Buck     Home Medications:  Prior to Admission medications   Medication Sig Start Date End Date Taking? Authorizing Provider  Biotin 5000 MCG TABS Take 5,000 mcg by mouth daily.   Yes Provider, Historical, Buck  Calcium Carb-Cholecalciferol (CALCIUM + D3 PO) Take 1 tablet daily by mouth.   Yes Provider, Historical, Buck  carboxymethylcellulose (REFRESH PLUS) 0.5 % SOLN Place 1 drop into both eyes 3  (three) times daily as needed (dry eyes).   Yes Provider, Historical, Buck  estradiol (ESTRACE) 0.1 MG/GM vaginal cream Place 1 Applicatorful vaginally daily. 12/12/20  Yes Theresa Pheasant, Buck  estradiol (ESTRACE) 1 MG tablet TAKE 1/2 TABLET(0.5 MG) BY MOUTH DAILY 03/28/21  Yes Theresa Pheasant, Buck  metoprolol succinate (TOPROL-XL) 50 MG 24 hr tablet Take 1 tablet (50 mg total) by mouth daily. Take with or immediately following a meal. 06/29/21 10/12/21 Yes Theresa Buck  montelukast (SINGULAIR) 10 MG tablet Take 10 mg at bedtime by mouth.  09/29/16  Yes Provider, Historical, Buck  Multiple Vitamin (MULTIVITAMIN) tablet Take 1 tablet by mouth daily.   Yes Provider, Historical, Buck  pantoprazole (PROTONIX) 40 MG tablet TAKE 1 TABLET BY MOUTH EVERY DAY 02/04/21  Yes Theresa Pheasant, Buck  Probiotic Product (PROBIOTIC ADVANCED PO) Take 1 capsule daily by mouth.    Yes Provider, Historical, Buck  progesterone (PROMETRIUM) 100 MG capsule Take 100 mg daily by mouth. 01/22/17  Yes Provider, Historical, Buck  vitamin C (ASCORBIC ACID) 500 MG tablet Take 500 mg by mouth daily.   Yes Provider, Historical, Buck  fluconazole (DIFLUCAN) 150 MG tablet Take one tablet x 1 day.  May repeat x 1 if symptoms persists or do not resolve. Patient not taking: Reported on 10/12/2021 08/04/21   Theresa Pheasant, Buck  hydrocortisone (ANUSOL-HC) 25 MG suppository Place 1 suppository (25 mg total) rectally 2 (two) times daily. 06/10/21   Theresa Pheasant, Buck  predniSONE (STERAPRED UNI-PAK 21 TAB) 10 MG (21) TBPK tablet Take by mouth daily. As directed Patient not taking: Reported on 10/12/2021 07/26/21   Theresa Balloon, NP  sertraline (ZOLOFT) 50 MG tablet 1/2 tablet q day for 2 weeks and then increase to one po q day. Patient not taking: Reported on 10/12/2021 09/30/21   Theresa Pheasant, Buck    Inpatient Medications: Scheduled Meds:  acidophilus  1 capsule Oral Daily   estradiol  0.5 mg Oral Daily   metoprolol succinate  50 mg Oral Daily    montelukast  10 mg Oral QHS   multivitamin with minerals  1 tablet Oral Daily   mupirocin ointment  1 Application Nasal BID   pantoprazole  40 mg Oral Daily   senna  1 tablet Oral BID   sertraline  50 mg Oral Daily   Continuous Infusions:  methocarbamol (ROBAXIN) IV     PRN Meds: HYDROcodone-acetaminophen, methocarbamol **OR** methocarbamol (ROBAXIN) IV, morphine injection, polyvinyl alcohol  Allergies:    Allergies  Allergen Reactions   Ceftin [Cefuroxime Axetil] Rash    Social History:   Social History   Socioeconomic History   Marital status: Widowed    Spouse name: Not on file   Number of children: 3   Years of education: Not on file   Highest education level: Not on file  Occupational History   Not on file  Tobacco Use  Smoking status: Never   Smokeless tobacco: Never  Vaping Use   Vaping Use: Never used  Substance and Sexual Activity   Alcohol use: No    Alcohol/week: 0.0 standard drinks of alcohol   Drug use: No   Sexual activity: Not on file  Other Topics Concern   Not on file  Social History Narrative   Not on file   Social Determinants of Health   Financial Resource Strain: Not on file  Food Insecurity: Not on file  Transportation Needs: Not on file  Physical Activity: Not on file  Stress: Not on file  Social Connections: Not on file  Intimate Partner Violence: Not on file    Family History:    Family History  Problem Relation Age of Onset   Brain cancer Mother    ALS Father    Diabetes Brother        multiple   Heart disease Son        myocardial infarction, s/p CABG   Throat cancer Brother    Breast cancer Neg Hx      ROS:  Please see the history of present illness.  Review of Systems  Constitutional: Negative.   HENT: Negative.    Eyes: Negative.   Respiratory: Negative.    Cardiovascular: Negative.   Gastrointestinal: Negative.   Genitourinary: Negative.   Musculoskeletal:  Positive for falls and joint pain.  Skin:  Negative.   Neurological: Negative.   Endo/Heme/Allergies: Negative.   Psychiatric/Behavioral: Negative.      All other ROS reviewed and negative.     Physical Exam/Data:   Vitals:   10/12/21 0930 10/12/21 0945 10/12/21 1000 10/12/21 1015  BP: (!) 141/87  (!) 135/91   Pulse: 67 70 70 69  Resp: '19 15 13 11  '$ Temp:      TempSrc:      SpO2: 97% 96% 96% 99%  Weight:      Height:       No intake or output data in the 24 hours ending 10/12/21 1040    10/12/2021    7:19 AM 09/30/2021    3:26 PM 08/26/2021    3:28 PM  Last 3 Weights  Weight (lbs) 141 lb 143 lb 12.8 oz 141 lb 8 oz  Weight (kg) 63.957 kg 65.227 kg 64.184 kg     Body mass index is 22.42 kg/m.  General:  Well nourished, well developed, in no acute distress, but visibly in pain  HEENT: normal Neck: no JVD Vascular: No carotid bruits; Distal pulses 2+ bilaterally Cardiac:  normal S1, S2; RRR; no murmur  Lungs:  clear to auscultation bilaterally, no wheezing, rhonchi or rales  Abd: soft, nontender, no hepatomegaly  Ext: no edema, left leg is noted shorter that the right, from fracture Musculoskeletal:  No deformities, BUE and BLE strength normal and equal Skin: warm and dry  Neuro:  CNs 2-12 intact, no focal abnormalities noted Psych:  Normal affect   EKG:  The EKG was personally reviewed and demonstrates:  SR rate of 80, with a new LBBB Telemetry:  Telemetry was personally reviewed and demonstrates:  SR rate of 70, but no BBB noted  Relevant CV Studies: Echocardiogram completed on 06/17/2021  1. Left ventricular ejection fraction, by estimation, is 55 to 60%. The  left ventricle has normal function. The left ventricle has no regional  wall motion abnormalities. Left ventricular diastolic parameters are  consistent with Buck I diastolic  dysfunction (impaired relaxation). The average left ventricular global  longitudinal strain is -17.6 %.   2. Right ventricular systolic function is normal. The right ventricular   size is normal. There is normal pulmonary artery systolic pressure. The  estimated right ventricular systolic pressure is 41.9 mmHg.   3. The mitral valve is normal in structure. Mild mitral valve  regurgitation. No evidence of mitral stenosis.   4. The aortic valve is tricuspid. Aortic valve regurgitation is not  visualized. No aortic stenosis is present.   5. The inferior vena cava is normal in size with greater than 50%  respiratory variability, suggesting right atrial pressure of 3 mmHg.   Laboratory Data:  High Sensitivity Troponin:   Recent Labs  Lab 10/12/21 0725  TROPONINIHS 3     Chemistry Recent Labs  Lab 10/12/21 0725  NA 140  K 3.9  CL 107  CO2 28  GLUCOSE 121*  BUN 17  CREATININE 0.67  CALCIUM 9.3  GFRNONAA >60  ANIONGAP 5    Recent Labs  Lab 10/12/21 0725  PROT 7.3  ALBUMIN 4.1  AST 20  ALT 18  ALKPHOS 65  BILITOT 0.6   Lipids No results for input(s): "CHOL", "TRIG", "HDL", "LABVLDL", "LDLCALC", "CHOLHDL" in the last 168 hours.  Hematology Recent Labs  Lab 10/12/21 0725  WBC 7.2  RBC 4.48  HGB 14.3  HCT 43.3  MCV 96.7  MCH 31.9  MCHC 33.0  RDW 11.8  PLT 227   Thyroid No results for input(s): "TSH", "FREET4" in the last 168 hours.  BNPNo results for input(s): "BNP", "PROBNP" in the last 168 hours.  DDimer No results for input(s): "DDIMER" in the last 168 hours.   Radiology/Studies:  CT HEAD WO CONTRAST (5MM)  Result Date: 10/12/2021 CLINICAL DATA:  [place] at home, neck trauma, shortening of LEFT leg, denies loss of consciousness and head injury EXAM: CT HEAD WITHOUT CONTRAST CT CERVICAL SPINE WITHOUT CONTRAST TECHNIQUE: Multidetector CT imaging of the head and cervical spine was performed following the standard protocol without intravenous contrast. Multiplanar CT image reconstructions of the cervical spine were also generated. RADIATION DOSE REDUCTION: This exam was performed according to the departmental dose-optimization program which  includes automated exposure control, adjustment of the mA and/or kV according to patient size and/or use of iterative reconstruction technique. COMPARISON:  None FINDINGS: CT HEAD FINDINGS Brain: Normal ventricular morphology. No midline shift or mass effect. Normal appearance of brain parenchyma. No intracranial hemorrhage, mass lesion, evidence of acute infarction, or extra-axial fluid collection. Vascular: No hyperdense vessels Skull: Intact Sinuses/Orbits: Clear Other: N/A CT CERVICAL SPINE FINDINGS Alignment: Minimal retrolisthesis at C4-C5 and C5-C6. Remaining alignments normal. Skull base and vertebrae: Vertebral body heights maintained. Osseous mineralization normal. Skull base intact. Disc space narrowing with endplate spur formation and sclerosis at C4-C5 and C5-C6, minimally C6-C7. No fracture, additional subluxation, or bone destruction. Uncovertebral spurs encroach upon cervical neural foramina bilaterally at C4-C5 through C6-C7. Soft tissues and spinal canal: Prevertebral soft tissues normal thickness. Disc levels:  No additional abnormalities Upper chest: Minimal biapical scarring Other: N/A IMPRESSION: No acute intracranial abnormalities. Degenerative disc disease changes and uncovertebral degenerative changes at C4-C5 through C6-C7 as above. No acute cervical spine abnormalities. Electronically Signed   By: Lavonia Dana M.D.   On: 10/12/2021 08:27   CT Cervical Spine Wo Contrast  Result Date: 10/12/2021 CLINICAL DATA:  [place] at home, neck trauma, shortening of LEFT leg, denies loss of consciousness and head injury EXAM: CT HEAD WITHOUT CONTRAST CT CERVICAL SPINE WITHOUT CONTRAST TECHNIQUE: Multidetector CT  imaging of the head and cervical spine was performed following the standard protocol without intravenous contrast. Multiplanar CT image reconstructions of the cervical spine were also generated. RADIATION DOSE REDUCTION: This exam was performed according to the departmental dose-optimization  program which includes automated exposure control, adjustment of the mA and/or kV according to patient size and/or use of iterative reconstruction technique. COMPARISON:  None FINDINGS: CT HEAD FINDINGS Brain: Normal ventricular morphology. No midline shift or mass effect. Normal appearance of brain parenchyma. No intracranial hemorrhage, mass lesion, evidence of acute infarction, or extra-axial fluid collection. Vascular: No hyperdense vessels Skull: Intact Sinuses/Orbits: Clear Other: N/A CT CERVICAL SPINE FINDINGS Alignment: Minimal retrolisthesis at C4-C5 and C5-C6. Remaining alignments normal. Skull base and vertebrae: Vertebral body heights maintained. Osseous mineralization normal. Skull base intact. Disc space narrowing with endplate spur formation and sclerosis at C4-C5 and C5-C6, minimally C6-C7. No fracture, additional subluxation, or bone destruction. Uncovertebral spurs encroach upon cervical neural foramina bilaterally at C4-C5 through C6-C7. Soft tissues and spinal canal: Prevertebral soft tissues normal thickness. Disc levels:  No additional abnormalities Upper chest: Minimal biapical scarring Other: N/A IMPRESSION: No acute intracranial abnormalities. Degenerative disc disease changes and uncovertebral degenerative changes at C4-C5 through C6-C7 as above. No acute cervical spine abnormalities. Electronically Signed   By: Lavonia Dana M.D.   On: 10/12/2021 08:27   DG Chest Portable 1 View  Result Date: 10/12/2021 CLINICAL DATA:  [place].  Left hip fracture. EXAM: PORTABLE CHEST 1 VIEW COMPARISON:  05/16/2021 FINDINGS: The cardiac silhouette, mediastinal and hilar contours are normal. The lungs are clear. No pleural effusions. No pulmonary lesions. The bony thorax is intact. IMPRESSION: No acute cardiopulmonary findings. Electronically Signed   By: Marijo Sanes M.D.   On: 10/12/2021 08:09   DG Hip Unilat W or Wo Pelvis 2-3 Views Left  Result Date: 10/12/2021 CLINICAL DATA:  [place].  Left hip pain.  EXAM: DG HIP (WITH OR WITHOUT PELVIS) 2-3V LEFT COMPARISON:  None Available. FINDINGS: There is a displaced left femoral neck fracture on the left side. The right hip is intact. The pubic symphysis and SI joints are intact. No pelvic fractures. IMPRESSION: Displaced left femoral neck fracture. Electronically Signed   By: Marijo Sanes M.D.   On: 10/12/2021 08:08     Assessment and Plan:   Femoral neck fracture -status post mechanical fall -ortho following -surgery pending for AM -pain control  2. New left bundle branch block on EKG -12 lead shows LBBB -transient as not noted on bedside cardiac monitor -patient is pain free  -Hs troponins negative -Revised cardiac risk Index reveals 1 point or 6% 30 day risk of death, MI, or cardiac arrest -Coronary CTA showed no significant CAD -2D echocardiogram revealed LVEF 55-60% with no WMA  3. History of SVT -s/p ablation -continue metoprolol -currently rate 70's -patient has remained without symptoms of palpitations or fatigue   Risk Assessment/Risk Scores:     For questions or updates, please contact [facility] Please consult [email or web address] for contact info under    Signed, Verenice Westrich, NP  10/12/2021 10:40 AM

## 2021-10-12 NOTE — H&P (Signed)
History and Physical    Patient: Theresa Buck:[id number] DOB: 01/15/1956 DOA: 10/12/2021 DOS: the patient was seen and examined on 10/12/2021 PCP: Einar Pheasant, MD  Patient coming from: Home  Chief Complaint:  Chief Complaint  Patient presents with   Fall   Hip Pain   HPI: Theresa Buck is a 66 y.o. female with medical history significant for SVT status post ablation, history of GERD, anxiety disorder who presents to the ER via EMS for evaluation of left hip pain. Patient states that she got up this morning and was trying to put her shorts on when her feet got tangled up and she fell landing on her left side.  She denies feeling dizzy or lightheaded prior to the fall and denies any head injury or loss of consciousness.  She was unable to bear weight on her left leg due to severe pain and so EMS was called.  Patient noted to have shortening of her left leg. She denies having any chest pain, no shortness of breath, no nausea, no vomiting, no headache, no fever, no chills, no cough, no abdominal pain, no urinary symptoms or any changes in her bowel habits. Left hip x-ray shows displaced left femoral neck fracture. She will be admitted to the hospital for further evaluation   Review of Systems: As mentioned in the history of present illness. All other systems reviewed and are negative. Past Medical History:  Diagnosis Date   Allergy    recurring sinus problems   Anxiety    Arthritis    Chronic back pain    Deviated septum    Deviated septum    GERD (gastroesophageal reflux disease)    H/O sinusitis    Headache    H/O MIGRAINES   Hx of degenerative disc disease    Scoliosis    SVT (supraventricular tachycardia) ([place])    Past Surgical History:  Procedure Laterality Date   BACK SURGERY  09/26/2013   LUMBAR   COLONOSCOPY  08/03/2015   Dr Vira Agar   HEMORRHOID SURGERY N/A 03/13/2017   Procedure: HEMORRHOIDECTOMY;  Surgeon: Christene Lye, MD;  Location: [facility]  ORS;  Service: General;  Laterality: N/A;   SVT ABLATION N/A 09/16/2020   Procedure: SVT ABLATION;  Surgeon: Vickie Epley, MD;  Location: [facility];  Service: Cardiovascular;  Laterality: N/A;   UPPER GI ENDOSCOPY  08/03/2015   Dr Vira Agar   Social History:  reports that she has never smoked. She has never used smokeless tobacco. She reports that she does not drink alcohol and does not use drugs.  Allergies  Allergen Reactions   Ceftin [Cefuroxime Axetil] Rash    Family History  Problem Relation Age of Onset   Brain cancer Mother    ALS Father    Diabetes Brother        multiple   Heart disease Son        myocardial infarction, s/p CABG   Throat cancer Brother    Breast cancer Neg Hx     Prior to Admission medications   Medication Sig Start Date End Date Taking? Authorizing Provider  Biotin 5000 MCG TABS Take 5,000 mcg by mouth daily.   Yes [provider]  Calcium Carb-Cholecalciferol (CALCIUM + D3 PO) Take 1 tablet daily by mouth.   Yes [provider]  carboxymethylcellulose (REFRESH PLUS) 0.5 % SOLN Place 1 drop into both eyes 3 (three) times daily as needed (dry eyes).   Yes [provider]  estradiol (ESTRACE) 0.1 MG/GM vaginal cream Place 1 Applicatorful vaginally daily. 12/12/20  Yes Einar Pheasant, MD  estradiol (ESTRACE) 1 MG tablet TAKE 1/2 TABLET(0.5 MG) BY MOUTH DAILY 03/28/21  Yes Einar Pheasant, MD  metoprolol succinate (TOPROL-XL) 50 MG 24 hr tablet Take 1 tablet (50 mg total) by mouth daily. Take with or immediately following a meal. 06/29/21 10/12/21 Yes Vickie Epley, MD  montelukast (SINGULAIR) 10 MG tablet Take 10 mg at bedtime by mouth.  09/29/16  Yes [provider]  Multiple Vitamin (MULTIVITAMIN) tablet Take 1 tablet by mouth daily.   Yes [provider]  pantoprazole (PROTONIX) 40 MG tablet TAKE 1 TABLET BY MOUTH EVERY DAY 02/04/21  Yes Einar Pheasant, MD  Probiotic Product (PROBIOTIC ADVANCED PO)  Take 1 capsule daily by mouth.    Yes [provider]  progesterone (PROMETRIUM) 100 MG capsule Take 100 mg daily by mouth. 01/22/17  Yes [provider]  vitamin C (ASCORBIC ACID) 500 MG tablet Take 500 mg by mouth daily.   Yes [provider]  fluconazole (DIFLUCAN) 150 MG tablet Take one tablet x 1 day.  May repeat x 1 if symptoms persists or do not resolve. Patient not taking: Reported on 10/12/2021 08/04/21   Einar Pheasant, MD  hydrocortisone (ANUSOL-HC) 25 MG suppository Place 1 suppository (25 mg total) rectally 2 (two) times daily. 06/10/21   Einar Pheasant, MD  predniSONE (STERAPRED UNI-PAK 21 TAB) 10 MG (21) TBPK tablet Take by mouth daily. As directed Patient not taking: Reported on 10/12/2021 07/26/21   Sharion Balloon, NP  sertraline (ZOLOFT) 50 MG tablet 1/2 tablet q day for 2 weeks and then increase to one po q day. Patient not taking: Reported on 10/12/2021 09/30/21   Einar Pheasant, MD    Physical Exam: Vitals:   10/12/21 0930 10/12/21 0945 10/12/21 1000 10/12/21 1015  BP: (!) 141/87  (!) 135/91   Pulse: 67 70 70 69  Resp: '19 15 13 11  '$ Temp:      TempSrc:      SpO2: 97% 96% 96% 99%  Weight:      Height:       Physical Exam Vitals and nursing note reviewed.  Constitutional:      Appearance: She is normal weight.     Comments: Appears to be in painful distress  HENT:     Head: Normocephalic and atraumatic.     Nose: Nose normal.     Mouth/Throat:     Mouth: Mucous membranes are moist.  Eyes:     Pupils: Pupils are equal, round, and reactive to light.  Cardiovascular:     Rate and Rhythm: Normal rate and regular rhythm.  Pulmonary:     Effort: Pulmonary effort is normal.     Breath sounds: Normal breath sounds.  Abdominal:     General: Abdomen is flat. Bowel sounds are normal.     Palpations: Abdomen is soft.  Musculoskeletal:        General: Normal range of motion.     Cervical back: Normal range of motion and neck supple.  Skin:     General: Skin is warm and dry.  Neurological:     General: No focal deficit present.     Mental Status: She is alert and oriented to person, place, and time.  Psychiatric:        Mood and Affect: Mood normal.        Behavior: Behavior normal.     Data Reviewed:  Relevant notes from primary care and specialist visits, past discharge summaries as available in EHR, including Care Everywhere. Prior diagnostic testing as pertinent to current admission diagnoses Updated medications and problem lists for reconciliation ED course, including vitals, labs, imaging, treatment and response to treatment Triage notes, nursing and pharmacy notes and ED provider's notes Notable results as noted in HPI Labs reviewed.  Sodium 140, potassium 3.9, chloride 107, bicarb 28, glucose 121, BUN 17, creatinine 0.67, calcium 9.3, total protein 7.3, albumin 4.1, AST 20, ALT 18, alkaline phosphatase 65, total bilirubin 0.6, white count 7.2, hemoglobin 14.3, hematocrit 43.3, RDW 11.8, platelet count 227 Left hip x-ray shows Displaced left femoral neck fracture. Chest x-ray reviewed by me shows No acute cardiopulmonary findings. CT head /cervical spine CT shows no acute intracranial abnormalities.Degenerative disc disease changes and uncovertebral degenerative changes at C4-C5 through C6-C7 as above. No acute cervical spine abnormalities. There are no new results to review at this time. Assessment and Plan: * Femoral neck fracture ([place]) Status post mechanical fall with a femoral neck fracture Immobilize left lower extremity Pain control Place patient on as needed muscle relaxants Orthopedic surgery consult  LBBB (left bundle branch block) Twelve-lead EKG shows a left bundle branch block which appears to be a new finding. We will request cardiology consult for preoperative evaluation  Supraventricular tachycardia ([place]) Patient has a history of SVT and is status post ablation Continue  metoprolol  Gastro-esophageal reflux disease without esophagitis Stable Continue PPI  Depression Stable Continue Zoloft      Advance Care Planning:   Code Status: Full Code   Consults: Orthopedic surgery  Family Communication: Greater than 50% of time was spent discussing patient's condition and plan of care with her at the bedside.  All questions and concerns have been addressed.  She verbalizes understanding and agrees to the plan.  Severity of Illness: The appropriate patient status for this patient is INPATIENT. Inpatient status is judged to be reasonable and necessary in order to provide the required intensity of service to ensure the patient's safety. The patient's presenting symptoms, physical exam findings, and initial radiographic and laboratory data in the context of their chronic comorbidities is felt to place them at high risk for further clinical deterioration. Furthermore, it is not anticipated that the patient will be medically stable for discharge from the hospital within 2 midnights of admission.   * I certify that at the point of admission it is my clinical judgment that the patient will require inpatient hospital care spanning beyond 2 midnights from the point of admission due to high intensity of service, high risk for further deterioration and high frequency of surveillance required.*  Author: Collier Bullock, MD 10/12/2021 10:30 AM  For on call review [email or web address].

## 2021-10-13 ENCOUNTER — Other Ambulatory Visit: Payer: Self-pay

## 2021-10-13 ENCOUNTER — Inpatient Hospital Stay: Payer: BC Managed Care – PPO | Admitting: Anesthesiology

## 2021-10-13 ENCOUNTER — Inpatient Hospital Stay: Payer: BC Managed Care – PPO

## 2021-10-13 ENCOUNTER — Encounter
Admission: EM | Disposition: A | Discharge status: Home / Self Care | Payer: Self-pay | Source: Home / Self Care | Attending: Internal Medicine

## 2021-10-13 ENCOUNTER — Encounter: Payer: Self-pay | Admitting: Internal Medicine

## 2021-10-13 DIAGNOSIS — K219 Gastro-esophageal reflux disease without esophagitis: Secondary | ICD-10-CM | POA: Diagnosis not present

## 2021-10-13 DIAGNOSIS — F32A Depression, unspecified: Secondary | ICD-10-CM

## 2021-10-13 DIAGNOSIS — F329 Major depressive disorder, single episode, unspecified: Secondary | ICD-10-CM | POA: Diagnosis not present

## 2021-10-13 DIAGNOSIS — S72002S Fracture of unspecified part of neck of left femur, sequela: Secondary | ICD-10-CM

## 2021-10-13 DIAGNOSIS — I447 Left bundle-branch block, unspecified: Secondary | ICD-10-CM | POA: Diagnosis not present

## 2021-10-13 HISTORY — PX: TOTAL HIP ARTHROPLASTY: SHX124

## 2021-10-13 LAB — CBC
HCT: 33.1 % — ABNORMAL LOW (ref 36.0–46.0)
Hemoglobin: 10.7 g/dL — ABNORMAL LOW (ref 12.0–15.0)
MCH: 31.8 pg (ref 26.0–34.0)
MCHC: 32.3 g/dL (ref 30.0–36.0)
MCV: 98.5 fL (ref 80.0–100.0)
Platelets: 132 10*3/uL — ABNORMAL LOW (ref 150–400)
RBC: 3.36 MIL/uL — ABNORMAL LOW (ref 3.87–5.11)
RDW: 11.8 % (ref 11.5–15.5)
WBC: 12.2 10*3/uL — ABNORMAL HIGH (ref 4.0–10.5)
nRBC: 0 % (ref 0.0–0.2)

## 2021-10-13 LAB — ABO/RH: ABO/RH(D): A POS

## 2021-10-13 LAB — CREATININE, SERUM
Creatinine, Ser: 0.41 mg/dL — ABNORMAL LOW (ref 0.44–1.00)
GFR, Estimated: >60 mL/min (ref >60)

## 2021-10-13 SURGERY — ARTHROPLASTY, HIP, TOTAL, ANTERIOR APPROACH
Anesthesia: General | Site: Hip | Laterality: Left

## 2021-10-13 MED ORDER — PROPOFOL 10 MG/ML IV BOLUS
INTRAVENOUS | Status: DC | PRN
  Administered 2021-10-13: 80 ug/kg/min via INTRAVENOUS
  Administered 2021-10-13: 30 mg via INTRAVENOUS

## 2021-10-13 MED ORDER — PROPOFOL 1000 MG/100ML IV EMUL
INTRAVENOUS | Status: AC
  Filled 2021-10-13: qty 100

## 2021-10-13 MED ORDER — ONDANSETRON HCL 4 MG/2ML IJ SOLN
4.0000 mg | Freq: Four times a day (QID) | INTRAMUSCULAR | Status: DC | PRN
  Administered 2021-10-15: 4 mg via INTRAVENOUS
  Filled 2021-10-13: qty 2

## 2021-10-13 MED ORDER — PHENOL 1.4 % MT LIQD: 1.0000 | OROMUCOSAL | Status: DC | PRN

## 2021-10-13 MED ORDER — BISACODYL 10 MG RE SUPP: 10.0000 mg | Freq: Every day | RECTAL | Status: DC | PRN

## 2021-10-13 MED ORDER — PHENYLEPHRINE HCL-NACL 20-0.9 MG/250ML-% IV SOLN
INTRAVENOUS | Status: DC | PRN
  Administered 2021-10-13: 20 ug/min via INTRAVENOUS

## 2021-10-13 MED ORDER — ZOLPIDEM TARTRATE 5 MG PO TABS
5.0000 mg | ORAL_TABLET | Freq: Every evening | ORAL | Status: DC | PRN
  Administered 2021-10-13 – 2021-10-14 (×2): 5 mg via ORAL
  Filled 2021-10-13 (×2): qty 1

## 2021-10-13 MED ORDER — PROGESTERONE MICRONIZED 100 MG PO CAPS: 100.0000 mg | ORAL_CAPSULE | Freq: Every day | ORAL | Status: DC

## 2021-10-13 MED ORDER — MAGNESIUM HYDROXIDE 400 MG/5ML PO SUSP
30.0000 mL | Freq: Every day | ORAL | Status: DC
  Filled 2021-10-13: qty 30

## 2021-10-13 MED ORDER — 0.9 % SODIUM CHLORIDE (POUR BTL) OPTIME
TOPICAL | Status: DC | PRN
  Administered 2021-10-13: 1000 mL

## 2021-10-13 MED ORDER — LIDOCAINE HCL (CARDIAC) PF 100 MG/5ML IV SOSY
PREFILLED_SYRINGE | INTRAVENOUS | Status: DC | PRN
  Administered 2021-10-13: 3 mL via INTRAVENOUS

## 2021-10-13 MED ORDER — ACETAMINOPHEN 10 MG/ML IV SOLN: 1000.0000 mg | Freq: Once | INTRAVENOUS | Status: DC | PRN

## 2021-10-13 MED ORDER — FENTANYL CITRATE (PF) 100 MCG/2ML IJ SOLN: 25.0000 ug | INTRAMUSCULAR | Status: DC | PRN

## 2021-10-13 MED ORDER — SODIUM CHLORIDE 0.9 % IV SOLN: INTRAVENOUS | Status: DC

## 2021-10-13 MED ORDER — LACTATED RINGERS IV SOLN: INTRAVENOUS | Status: DC | PRN

## 2021-10-13 MED ORDER — FENTANYL CITRATE (PF) 100 MCG/2ML IJ SOLN
INTRAMUSCULAR | Status: DC | PRN
  Administered 2021-10-13: 25 ug via INTRAVENOUS

## 2021-10-13 MED ORDER — BUPIVACAINE HCL (PF) 0.5 % IJ SOLN
INTRAMUSCULAR | Status: DC | PRN
  Administered 2021-10-13: 3 mL

## 2021-10-13 MED ORDER — OXYCODONE HCL 5 MG/5ML PO SOLN: 5.0000 mg | Freq: Once | ORAL | Status: DC | PRN

## 2021-10-13 MED ORDER — ONDANSETRON HCL 4 MG/2ML IJ SOLN
INTRAMUSCULAR | Status: DC | PRN
  Administered 2021-10-13: 4 mg via INTRAVENOUS

## 2021-10-13 MED ORDER — CEFAZOLIN SODIUM-DEXTROSE 1-4 GM/50ML-% IV SOLN
1.0000 g | Freq: Four times a day (QID) | INTRAVENOUS | Status: AC
  Administered 2021-10-13 – 2021-10-14 (×2): 1 g via INTRAVENOUS
  Filled 2021-10-13 (×2): qty 50

## 2021-10-13 MED ORDER — MIDAZOLAM HCL 5 MG/5ML IJ SOLN
INTRAMUSCULAR | Status: DC | PRN
  Administered 2021-10-13: 2 mg via INTRAVENOUS

## 2021-10-13 MED ORDER — DEXAMETHASONE SODIUM PHOSPHATE 4 MG/ML IJ SOLN
INTRAMUSCULAR | Status: DC | PRN
  Administered 2021-10-13: 10 mg via INTRAVENOUS

## 2021-10-13 MED ORDER — DOCUSATE SODIUM 100 MG PO CAPS
100.0000 mg | ORAL_CAPSULE | Freq: Two times a day (BID) | ORAL | Status: DC
  Administered 2021-10-13 – 2021-10-15 (×4): 100 mg via ORAL
  Filled 2021-10-13 (×4): qty 1

## 2021-10-13 MED ORDER — OXYCODONE HCL 5 MG PO TABS: 5.0000 mg | ORAL_TABLET | Freq: Once | ORAL | Status: DC | PRN

## 2021-10-13 MED ORDER — DIPHENHYDRAMINE HCL 12.5 MG/5ML PO ELIX: 12.5000 mg | ORAL_SOLUTION | ORAL | Status: DC | PRN

## 2021-10-13 MED ORDER — PROGESTERONE MICRONIZED 100 MG PO CAPS
100.0000 mg | ORAL_CAPSULE | Freq: Every day | ORAL | Status: DC
  Administered 2021-10-13 – 2021-10-14 (×2): 100 mg via ORAL
  Filled 2021-10-13 (×2): qty 1

## 2021-10-13 MED ORDER — CEFAZOLIN SODIUM-DEXTROSE 1-4 GM/50ML-% IV SOLN
INTRAVENOUS | Status: AC
  Filled 2021-10-13: qty 50

## 2021-10-13 MED ORDER — METOCLOPRAMIDE HCL 5 MG PO TABS: 5.0000 mg | ORAL_TABLET | Freq: Three times a day (TID) | ORAL | Status: DC | PRN

## 2021-10-13 MED ORDER — ALUM & MAG HYDROXIDE-SIMETH 200-200-20 MG/5ML PO SUSP: 30.0000 mL | ORAL | Status: DC | PRN

## 2021-10-13 MED ORDER — ENOXAPARIN SODIUM 40 MG/0.4ML IJ SOSY
40.0000 mg | PREFILLED_SYRINGE | INTRAMUSCULAR | Status: DC
  Administered 2021-10-14 – 2021-10-15 (×2): 40 mg via SUBCUTANEOUS
  Filled 2021-10-13 (×2): qty 0.4

## 2021-10-13 MED ORDER — BUPIVACAINE-EPINEPHRINE (PF) 0.25% -1:200000 IJ SOLN
INTRAMUSCULAR | Status: AC
  Filled 2021-10-13: qty 30

## 2021-10-13 MED ORDER — MIDAZOLAM HCL 2 MG/2ML IJ SOLN
INTRAMUSCULAR | Status: AC
  Filled 2021-10-13: qty 2

## 2021-10-13 MED ORDER — ONDANSETRON HCL 4 MG PO TABS: 4.0000 mg | ORAL_TABLET | Freq: Four times a day (QID) | ORAL | Status: DC | PRN

## 2021-10-13 MED ORDER — MENTHOL 3 MG MT LOZG: 1.0000 | LOZENGE | OROMUCOSAL | Status: DC | PRN

## 2021-10-13 MED ORDER — SURGIPHOR WOUND IRRIGATION SYSTEM - OPTIME
TOPICAL | Status: DC | PRN
  Administered 2021-10-13: 450 mL via TOPICAL

## 2021-10-13 MED ORDER — HEMOSTATIC AGENTS (NO CHARGE) OPTIME
TOPICAL | Status: DC | PRN
  Administered 2021-10-13: 2 via TOPICAL

## 2021-10-13 MED ORDER — METOCLOPRAMIDE HCL 5 MG/ML IJ SOLN: 5.0000 mg | Freq: Three times a day (TID) | INTRAMUSCULAR | Status: DC | PRN

## 2021-10-13 MED ORDER — BUPIVACAINE LIPOSOME 1.3 % IJ SUSP
INTRAMUSCULAR | Status: AC
  Filled 2021-10-13: qty 20

## 2021-10-13 MED ORDER — SODIUM CHLORIDE (PF) 0.9 % IJ SOLN
INTRAMUSCULAR | Status: DC | PRN
  Administered 2021-10-13: 90 mL

## 2021-10-13 MED ORDER — LACTATED RINGERS IV SOLN: INTRAVENOUS | Status: DC

## 2021-10-13 MED ORDER — EPHEDRINE SULFATE (PRESSORS) 50 MG/ML IJ SOLN
INTRAMUSCULAR | Status: DC | PRN
  Administered 2021-10-13: 10 mg via INTRAVENOUS

## 2021-10-13 MED ORDER — FENTANYL CITRATE (PF) 100 MCG/2ML IJ SOLN
INTRAMUSCULAR | Status: AC
  Filled 2021-10-13: qty 2

## 2021-10-13 MED ORDER — ONDANSETRON HCL 4 MG/2ML IJ SOLN
4.0000 mg | Freq: Once | INTRAMUSCULAR | Status: DC | PRN
Start: 2021-10-13 — End: 2021-10-13

## 2021-10-13 MED ORDER — ESTRADIOL 1 MG PO TABS
0.5000 mg | ORAL_TABLET | Freq: Every day | ORAL | Status: DC
  Administered 2021-10-13 – 2021-10-14 (×2): 0.5 mg via ORAL
  Filled 2021-10-13 (×2): qty 0.5

## 2021-10-13 SURGICAL SUPPLY — 56 items
APL PRP STRL LF DISP 70% ISPRP (MISCELLANEOUS) ×1
BLADE SAGITTAL AGGR TOOTH XLG (BLADE) ×2 IMPLANT
BNDG CMPR 5X6 CHSV STRCH STRL (GAUZE/BANDAGES/DRESSINGS) ×3
BNDG COHESIVE 6X5 TAN ST LF (GAUZE/BANDAGES/DRESSINGS) ×6 IMPLANT
CANISTER WOUND CARE 500ML ATS (WOUND CARE) ×2 IMPLANT
CHLORAPREP W/TINT 26 (MISCELLANEOUS) ×2 IMPLANT
COVER BACK TABLE REUSABLE LG (DRAPES) ×2 IMPLANT
DRAPE 3/4 80X56 (DRAPES) ×6 IMPLANT
DRAPE C-ARM XRAY 36X54 (DRAPES) ×2 IMPLANT
DRAPE INCISE IOBAN 66X60 STRL (DRAPES) IMPLANT
DRAPE POUCH INSTRU U-SHP 10X18 (DRAPES) ×2 IMPLANT
DRESSING SURGICEL FIBRLLR 1X2 (HEMOSTASIS) ×2 IMPLANT
DRSG MEPILEX SACRM 8.7X9.8 (GAUZE/BANDAGES/DRESSINGS) ×2 IMPLANT
DRSG SURGICEL FIBRILLAR 1X2 (HEMOSTASIS) ×4
ELECT BLADE 6.5 EXT (BLADE) ×2 IMPLANT
ELECT REM PT RETURN 9FT ADLT (ELECTROSURGICAL) ×2
ELECTRODE REM PT RTRN 9FT ADLT (ELECTROSURGICAL) ×1 IMPLANT
GLOVE BIOGEL PI IND STRL 9 (GLOVE) ×1 IMPLANT
GLOVE BIOGEL PI INDICATOR 9 (GLOVE) ×1
GLOVE SURG SYN 9.0  PF PI (GLOVE) ×2
GLOVE SURG SYN 9.0 PF PI (GLOVE) ×2 IMPLANT
GOWN SRG 2XL LVL 4 RGLN SLV (GOWNS) ×1 IMPLANT
GOWN STRL NON-REIN 2XL LVL4 (GOWNS) ×2
GOWN STRL REUS W/ TWL LRG LVL3 (GOWN DISPOSABLE) ×1 IMPLANT
GOWN STRL REUS W/TWL LRG LVL3 (GOWN DISPOSABLE) ×2
HEAD FEMORAL 28MM SZ S (Head) ×1 IMPLANT
HIP DBL LINER 54X28 (Liner) ×1 IMPLANT
HOLDER FOLEY CATH W/STRAP (MISCELLANEOUS) ×2 IMPLANT
HOOD PEEL AWAY FLYTE STAYCOOL (MISCELLANEOUS) ×2 IMPLANT
KIT PREVENA INCISION MGT 13 (CANNISTER) ×2 IMPLANT
MANIFOLD NEPTUNE II (INSTRUMENTS) ×2 IMPLANT
MASTERLOC HIP STD S8 (Hips) ×1 IMPLANT
MAT ABSORB  FLUID 56X50 GRAY (MISCELLANEOUS) ×1
MAT ABSORB FLUID 56X50 GRAY (MISCELLANEOUS) ×1 IMPLANT
NDL SPNL 20GX3.5 QUINCKE YW (NEEDLE) ×2 IMPLANT
NEEDLE SPNL 20GX3.5 QUINCKE YW (NEEDLE) ×4 IMPLANT
NS IRRIG 1000ML POUR BTL (IV SOLUTION) ×2 IMPLANT
PACK HIP COMPR (MISCELLANEOUS) ×2 IMPLANT
SCALPEL PROTECTED #10 DISP (BLADE) ×4 IMPLANT
SHELL ACETABULAR SZ 54 DM (Shell) ×1 IMPLANT
SOLUTION IRRIG SURGIPHOR (IV SOLUTION) ×2 IMPLANT
STAPLER SKIN PROX 35W (STAPLE) ×2 IMPLANT
STRAP SAFETY 5IN WIDE (MISCELLANEOUS) ×2 IMPLANT
SUT DVC 2 QUILL PDO  T11 36X36 (SUTURE) ×1
SUT DVC 2 QUILL PDO T11 36X36 (SUTURE) ×1 IMPLANT
SUT SILK 0 (SUTURE) ×2
SUT SILK 0 30XBRD TIE 6 (SUTURE) ×1 IMPLANT
SUT V-LOC 90 ABS DVC 3-0 CL (SUTURE) ×2 IMPLANT
SUT VIC AB 1 CT1 36 (SUTURE) ×2 IMPLANT
SYR 50ML LL SCALE MARK (SYRINGE) ×4 IMPLANT
SYR BULB IRRIG 60ML STRL (SYRINGE) ×2 IMPLANT
TAPE MICROFOAM 4IN (TAPE) IMPLANT
TAPE STRIPS DRAPE STRL (GAUZE/BANDAGES/DRESSINGS) ×2 IMPLANT
TOWEL OR 17X26 4PK STRL BLUE (TOWEL DISPOSABLE) IMPLANT
TRAY FOLEY MTR SLVR 16FR STAT (SET/KITS/TRAYS/PACK) ×2 IMPLANT
WATER STERILE IRR 1000ML POUR (IV SOLUTION) ×2 IMPLANT

## 2021-10-13 NOTE — Op Note (Signed)
10/13/2021  5:04 PM  PATIENT:  Theresa Buck  66 y.o. female  PRE-OPERATIVE DIAGNOSIS:  Left femoral neck fracture, left hip osteoarthritis  POST-OPERATIVE DIAGNOSIS:  Left femoral neck fracture, left hip osteoarthritis  PROCEDURE:  Procedure(s): TOTAL HIP ARTHROPLASTY ANTERIOR APPROACH (Left)  SURGEON: Laurene Footman, MD  ASSISTANTS: None  ANESTHESIA:   spinal  EBL:  Total I/O In: 500 [I.V.:500] Out: 550 [Urine:450; Blood:100]  BLOOD ADMINISTERED:none  DRAINS:  Incisional wound VAC    LOCAL MEDICATIONS USED:  MARCAINE    and OTHER Exparel  SPECIMEN:  Source of Specimen:    Femoral neck and head  DISPOSITION OF SPECIMEN:  PATHOLOGY  COUNTS:  YES  TOURNIQUET:  * No tourniquets in log *  IMPLANTS: Medacta Master lock 8 standard stem with 54 mm Mpact DM cup and liner with ceramic S 28 mm head  DICTATION: .Dragon Dictation   The patient was brought to the operating room and after spinal anesthesia was obtained patient was placed on the operative table with the ipsilateral foot into the Medacta attachment, contralateral leg on a well-padded table. C-arm was brought in and preop template x-ray taken. After prepping and draping in usual sterile fashion appropriate patient identification and timeout procedures were completed. Anterior approach to the hip was obtained and centered over the greater trochanter and TFL muscle. The subcutaneous tissue was incised hemostasis being achieved by electrocautery. TFL fascia was incised and the muscle retracted laterally deep retractor placed. The lateral femoral circumflex vessels were identified and ligated. The anterior capsule was exposed and a capsulotomy performed. The neck was identified and fracture was booked just below the femoral head and a femoral neck cut carried out with a saw. The head was removed without difficulty and showed mild femoral head and acetabulum degenerative change 50. Reaming was carried out to 54 mm and a 54 mm cup  trial gave appropriate tightness to the acetabular component a 54 DM cup was impacted into position. The leg was then externally rotated and ischiofemoral and pubofemoral releases carried out. The femur was sequentially broached to a size 8, size 8 standard with S head trials were placed and the final components chosen. The 8 standard stem was inserted along with a ceramic S 28 mm head and 54 mm liner. The hip was reduced and was stable the wound was thoroughly irrigated with fibrillar placed along the posterior capsule and medial neck. The deep fascia ws closed using a heavy Quill after infiltration of 30 cc of quarter percent Sensorcaine with epinephrine diluted with Exparel throughout the case .3-0 V-loc to close the skin with skin staples.  Incisional wound VAC applied and patient was sent to recovery in stable condition.   PLAN OF CARE:  Continue as inpatient

## 2021-10-13 NOTE — Progress Notes (Signed)
PROGRESS NOTE    Theresa Buck  BTD:[id number] DOB: 1955-12-12 DOA: 10/12/2021 PCP: Einar Pheasant, MD    Brief Narrative:  Theresa Buck is a 66 y.o. female with medical history significant for SVT status post ablation, history of GERD, anxiety disorder who presents to the ER via EMS for evaluation of left hip pain. Patient states that she got up this morning and was trying to put her shorts on when her feet got tangled up and she fell landing on her left side.  She denies feeling dizzy or lightheaded prior to the fall and denies any head injury or loss of consciousness.  She was unable to bear weight on her left leg due to severe pain and so EMS was called.  Patient noted to have shortening of her left leg. She denies having any chest pain, no shortness of breath, no nausea, no vomiting, no headache, no fever, no chills, no cough, no abdominal pain, no urinary symptoms or any changes in her bowel habits. Left hip x-ray shows displaced left femoral neck fracture. She will be admitted to the hospital for further evaluation  6/22 plan for OR today  Consultants:  orthopedics  Procedures:   Antimicrobials:      Subjective: Some nausea this am as she took meds without eating something. No other issues  Objective: Vitals:   10/12/21 1015 10/12/21 1346 10/12/21 2032 10/13/21 0757  BP:  134/85 127/77 (!) 114/92  Pulse: 69 69 75 70  Resp: '11 17 16 17  '$ Temp:  98 F (36.7 C) 98 F (36.7 C) 97.6 F (36.4 C)  TempSrc:      SpO2: 99% 98% 99% 96%  Weight:      Height:        Intake/Output Summary (Last 24 hours) at 10/13/2021 0837 Last data filed at 10/12/2021 2317 Gross per 24 hour  Intake --  Output 1000 ml  Net -1000 ml   Filed Weights   10/12/21 0719  Weight: 64 kg    Examination: Calm, NAD Cta no w/r Reg s1/s2 no gallop Soft benign +bs No edema Aaoxox3  Mood and affect appropriate in current setting     Data Reviewed: I have personally reviewed following  labs and imaging studies  CBC: Recent Labs  Lab 10/12/21 0725  WBC 7.2  NEUTROABS 4.6  HGB 14.3  HCT 43.3  MCV 96.7  PLT 106   Basic Metabolic Panel: Recent Labs  Lab 10/12/21 0725  NA 140  K 3.9  CL 107  CO2 28  GLUCOSE 121*  BUN 17  CREATININE 0.67  CALCIUM 9.3   GFR: Estimated Creatinine Clearance: 67.9 mL/min (by C-G formula based on SCr of 0.67 mg/dL). Liver Function Tests: Recent Labs  Lab 10/12/21 0725  AST 20  ALT 18  ALKPHOS 65  BILITOT 0.6  PROT 7.3  ALBUMIN 4.1   No results for input(s): "LIPASE", "AMYLASE" in the last 168 hours. No results for input(s): "AMMONIA" in the last 168 hours. Coagulation Profile: Recent Labs  Lab 10/12/21 0725  INR 1.0   Cardiac Enzymes: No results for input(s): "CKTOTAL", "CKMB", "CKMBINDEX", "TROPONINI" in the last 168 hours. BNP (last 3 results) No results for input(s): "PROBNP" in the last 8760 hours. HbA1C: No results for input(s): "HGBA1C" in the last 72 hours. CBG: No results for input(s): "GLUCAP" in the last 168 hours. Lipid Profile: No results for input(s): "CHOL", "HDL", "LDLCALC", "TRIG", "CHOLHDL", "LDLDIRECT" in the last 72 hours. Thyroid Function Tests: No results  for input(s): "TSH", "T4TOTAL", "FREET4", "T3FREE", "THYROIDAB" in the last 72 hours. Anemia Panel: No results for input(s): "VITAMINB12", "FOLATE", "FERRITIN", "TIBC", "IRON", "RETICCTPCT" in the last 72 hours. Sepsis Labs: No results for input(s): "PROCALCITON", "LATICACIDVEN" in the last 168 hours.  Recent Results (from the past 240 hour(s))  Surgical PCR screen     Status: None   Collection Time: 10/12/21  4:00 PM   Specimen: Nasal Mucosa; Nasal Swab  Result Value Ref Range Status   MRSA, PCR NEGATIVE NEGATIVE Final   Staphylococcus aureus NEGATIVE NEGATIVE Final    Comment: (NOTE) The Xpert SA Assay (FDA approved for NASAL specimens in patients 9 years of age and older), is one component of a comprehensive surveillance  program. It is not intended to diagnose infection nor to guide or monitor treatment. Performed at [facility], [address] [place], [place] [address]          Radiology Studies: CT HEAD WO CONTRAST (5MM)  Result Date: 10/12/2021 CLINICAL DATA:  [place] at home, neck trauma, shortening of LEFT leg, denies loss of consciousness and head injury EXAM: CT HEAD WITHOUT CONTRAST CT CERVICAL SPINE WITHOUT CONTRAST TECHNIQUE: Multidetector CT imaging of the head and cervical spine was performed following the standard protocol without intravenous contrast. Multiplanar CT image reconstructions of the cervical spine were also generated. RADIATION DOSE REDUCTION: This exam was performed according to the departmental dose-optimization program which includes automated exposure control, adjustment of the mA and/or kV according to patient size and/or use of iterative reconstruction technique. COMPARISON:  None FINDINGS: CT HEAD FINDINGS Brain: Normal ventricular morphology. No midline shift or mass effect. Normal appearance of brain parenchyma. No intracranial hemorrhage, mass lesion, evidence of acute infarction, or extra-axial fluid collection. Vascular: No hyperdense vessels Skull: Intact Sinuses/Orbits: Clear Other: N/A CT CERVICAL SPINE FINDINGS Alignment: Minimal retrolisthesis at C4-C5 and C5-C6. Remaining alignments normal. Skull base and vertebrae: Vertebral body heights maintained. Osseous mineralization normal. Skull base intact. Disc space narrowing with endplate spur formation and sclerosis at C4-C5 and C5-C6, minimally C6-C7. No fracture, additional subluxation, or bone destruction. Uncovertebral spurs encroach upon cervical neural foramina bilaterally at C4-C5 through C6-C7. Soft tissues and spinal canal: Prevertebral soft tissues normal thickness. Disc levels:  No additional abnormalities Upper chest: Minimal biapical scarring Other: N/A IMPRESSION: No acute intracranial abnormalities.  Degenerative disc disease changes and uncovertebral degenerative changes at C4-C5 through C6-C7 as above. No acute cervical spine abnormalities. Electronically Signed   By: Lavonia Dana M.D.   On: 10/12/2021 08:27   CT Cervical Spine Wo Contrast  Result Date: 10/12/2021 CLINICAL DATA:  [place] at home, neck trauma, shortening of LEFT leg, denies loss of consciousness and head injury EXAM: CT HEAD WITHOUT CONTRAST CT CERVICAL SPINE WITHOUT CONTRAST TECHNIQUE: Multidetector CT imaging of the head and cervical spine was performed following the standard protocol without intravenous contrast. Multiplanar CT image reconstructions of the cervical spine were also generated. RADIATION DOSE REDUCTION: This exam was performed according to the departmental dose-optimization program which includes automated exposure control, adjustment of the mA and/or kV according to patient size and/or use of iterative reconstruction technique. COMPARISON:  None FINDINGS: CT HEAD FINDINGS Brain: Normal ventricular morphology. No midline shift or mass effect. Normal appearance of brain parenchyma. No intracranial hemorrhage, mass lesion, evidence of acute infarction, or extra-axial fluid collection. Vascular: No hyperdense vessels Skull: Intact Sinuses/Orbits: Clear Other: N/A CT CERVICAL SPINE FINDINGS Alignment: Minimal retrolisthesis at C4-C5 and C5-C6. Remaining alignments normal. Skull base and vertebrae:  Vertebral body heights maintained. Osseous mineralization normal. Skull base intact. Disc space narrowing with endplate spur formation and sclerosis at C4-C5 and C5-C6, minimally C6-C7. No fracture, additional subluxation, or bone destruction. Uncovertebral spurs encroach upon cervical neural foramina bilaterally at C4-C5 through C6-C7. Soft tissues and spinal canal: Prevertebral soft tissues normal thickness. Disc levels:  No additional abnormalities Upper chest: Minimal biapical scarring Other: N/A IMPRESSION: No acute intracranial  abnormalities. Degenerative disc disease changes and uncovertebral degenerative changes at C4-C5 through C6-C7 as above. No acute cervical spine abnormalities. Electronically Signed   By: Lavonia Dana M.D.   On: 10/12/2021 08:27   DG Chest Portable 1 View  Result Date: 10/12/2021 CLINICAL DATA:  [place].  Left hip fracture. EXAM: PORTABLE CHEST 1 VIEW COMPARISON:  05/16/2021 FINDINGS: The cardiac silhouette, mediastinal and hilar contours are normal. The lungs are clear. No pleural effusions. No pulmonary lesions. The bony thorax is intact. IMPRESSION: No acute cardiopulmonary findings. Electronically Signed   By: Marijo Sanes M.D.   On: 10/12/2021 08:09   DG Hip Unilat W or Wo Pelvis 2-3 Views Left  Result Date: 10/12/2021 CLINICAL DATA:  [place].  Left hip pain. EXAM: DG HIP (WITH OR WITHOUT PELVIS) 2-3V LEFT COMPARISON:  None Available. FINDINGS: There is a displaced left femoral neck fracture on the left side. The right hip is intact. The pubic symphysis and SI joints are intact. No pelvic fractures. IMPRESSION: Displaced left femoral neck fracture. Electronically Signed   By: Marijo Sanes M.D.   On: 10/12/2021 08:08        Scheduled Meds:  acidophilus  1 capsule Oral Daily   estradiol  0.5 mg Oral Daily   metoprolol succinate  50 mg Oral Daily   montelukast  10 mg Oral QHS   multivitamin with minerals  1 tablet Oral Daily   mupirocin ointment  1 Application Nasal BID   pantoprazole  40 mg Oral Daily   senna  1 tablet Oral BID   sertraline  50 mg Oral Daily   Continuous Infusions:   ceFAZolin (ANCEF) IV     methocarbamol (ROBAXIN) IV      Assessment & Plan:   Principal Problem:   Femoral neck fracture ([facility]) Active Problems:   LBBB (left bundle branch block)   Supraventricular tachycardia ([facility])   Gastro-esophageal reflux disease without esophagitis   Depression   Femoral neck fracture ([facility]) Status post mechanical fall with a femoral neck fracture Immobilize left lower  extremity Pain control 6/22 plan for OR today   ILBBB (left bundle branch block) Twelve-lead EKG shows a left bundle branch block which appears to be a new finding. 6/22 cardiology cleared patient for surgery   Supraventricular tachycardia [facility] Patient has a history of SVT and is status post ablation 6/22 continue beta blk   Gastro-esophageal reflux disease without esophagitis Stable  Continue PPI   Depression Stable Continue Zoloft   DVT prophylaxis: scd Code Status:full Family Communication: husband at beside Disposition Plan:  Status is: Inpatient Remains inpatient appropriate because: IV tx. Needs surgery today        LOS: 1 day   Time spent: 20 min     Nolberto Hanlon, MD [facility] Pager [phone]  If 7PM-7AM, please contact night-coverage 10/13/2021, 8:37 AM

## 2021-10-13 NOTE — Transfer of Care (Signed)
Immediate Anesthesia Transfer of Care Note  Patient: Theresa Buck  Procedure(s) Performed: TOTAL HIP ARTHROPLASTY ANTERIOR APPROACH (Left: Hip)  Patient Location: PACU  Anesthesia Type:Spinal  Level of Consciousness: awake, alert  and oriented  Airway & Oxygen Therapy: Patient Spontanous Breathing and Patient connected to face mask oxygen  Post-op Assessment: Report given to RN and Post -op Vital signs reviewed and stable  Post vital signs: Reviewed and stable  Last Vitals:  Vitals Value Taken Time  BP 119/71 10/13/21 1702  Temp 36.4 C 10/13/21 1702  Pulse 82 10/13/21 1703  Resp 18 10/13/21 1704  SpO2 97 % 10/13/21 1703  Vitals shown include unvalidated device data.  Last Pain:  Vitals:   10/13/21 1445  TempSrc: Temporal  PainSc: 0-No pain         Complications: No notable events documented.

## 2021-10-13 NOTE — Plan of Care (Signed)

## 2021-10-13 NOTE — Progress Notes (Signed)
PT Cancellation Note  Patient Details Name: ELISANDRA DESHMUKH MRN: [id number] DOB: [birth year]   Cancelled Treatment:    Reason Eval/Treat Not Completed: Patient not medically ready. Patient having surgery today, evaluate tomorrow.   Harry Shuck 10/13/2021, 10:36 AM

## 2021-10-13 NOTE — Anesthesia Procedure Notes (Addendum)
Spinal  Patient location during procedure: OR Start time: 10/13/2021 3:22 PM End time: 10/13/2021 3:27 PM Reason for block: surgical anesthesia Staffing Performed: anesthesiologist  Other anesthesia staff: Carmelina Paddock, RN Performed by: Carmelina Paddock, RN Authorized by: Darrin Nipper, MD   Preanesthetic Checklist Completed: patient identified, IV checked, site marked, risks and benefits discussed, surgical consent, monitors and equipment checked, pre-op evaluation and timeout performed Spinal Block Patient position: sitting Prep: ChloraPrep and site prepped and draped Patient monitoring: heart rate, continuous pulse ox, blood pressure and cardiac monitor Approach: midline Location: L3-4 Injection technique: single-shot Needle Needle type: Whitacre and Introducer  Needle gauge: 25 G Needle length: 9 cm Assessment Sensory level: T10 Events: CSF return Additional Notes Sterile aseptic technique used throughout the procedure.  Negative paresthesia. Negative blood return. Positive free-flowing CSF. Expiration date of kit checked and confirmed. Patient tolerated procedure well, without complications.

## 2021-10-13 NOTE — Progress Notes (Signed)
OT Cancellation Note  Patient Details Name: VIOLA KINNICK MRN: [id number] DOB: Jan 10, 1956   Cancelled Treatment:    Reason Eval/Treat Not Completed: Patient not medically ready. Consult received, chart reviewed. Pt scheduled for surgery today. Will evaluate tomorrow.   Ardeth Perfect., MPH, MS, OTR/L ascom [phone] 10/13/21, 10:37 AM

## 2021-10-13 NOTE — Anesthesia Procedure Notes (Signed)
Date/Time: 10/13/2021 3:30 PM  Performed by: Nelda Marseille, CRNAPre-anesthesia Checklist: Patient identified, Emergency Drugs available, Suction available, Patient being monitored and Timeout performed Oxygen Delivery Method: Simple face mask

## 2021-10-14 ENCOUNTER — Encounter: Payer: Self-pay | Admitting: Orthopedic Surgery

## 2021-10-14 DIAGNOSIS — K219 Gastro-esophageal reflux disease without esophagitis: Secondary | ICD-10-CM | POA: Diagnosis not present

## 2021-10-14 DIAGNOSIS — F329 Major depressive disorder, single episode, unspecified: Secondary | ICD-10-CM | POA: Diagnosis not present

## 2021-10-14 DIAGNOSIS — S72002S Fracture of unspecified part of neck of left femur, sequela: Secondary | ICD-10-CM | POA: Diagnosis not present

## 2021-10-14 DIAGNOSIS — I447 Left bundle-branch block, unspecified: Secondary | ICD-10-CM | POA: Diagnosis not present

## 2021-10-14 LAB — CBC
HCT: 39 % (ref 36.0–46.0)
Hemoglobin: 12.7 g/dL (ref 12.0–15.0)
MCH: 31.4 pg (ref 26.0–34.0)
MCHC: 32.6 g/dL (ref 30.0–36.0)
MCV: 96.5 fL (ref 80.0–100.0)
Platelets: 187 10*3/uL (ref 150–400)
RBC: 4.04 MIL/uL (ref 3.87–5.11)
RDW: 11.5 % (ref 11.5–15.5)
WBC: 14.9 10*3/uL — ABNORMAL HIGH (ref 4.0–10.5)
nRBC: 0 % (ref 0.0–0.2)

## 2021-10-14 LAB — BASIC METABOLIC PANEL
Anion gap: 4 — ABNORMAL LOW (ref 5–15)
BUN: 9 mg/dL (ref 8–23)
CO2: 25 mmol/L (ref 22–32)
Calcium: 8.6 mg/dL — ABNORMAL LOW (ref 8.9–10.3)
Chloride: 108 mmol/L (ref 98–111)
Creatinine, Ser: 0.5 mg/dL (ref 0.44–1.00)
GFR, Estimated: >60 mL/min (ref >60)
Glucose, Bld: 135 mg/dL — ABNORMAL HIGH (ref 70–99)
Potassium: 4.3 mmol/L (ref 3.5–5.1)
Sodium: 137 mmol/L (ref 135–145)

## 2021-10-14 LAB — HIV ANTIBODY (ROUTINE TESTING W REFLEX): HIV Screen 4th Generation wRfx: NONREACTIVE

## 2021-10-14 MED ORDER — HYDROCODONE-ACETAMINOPHEN 5-325 MG PO TABS: 1.0000 | ORAL_TABLET | Freq: Four times a day (QID) | ORAL | 0 refills | Status: DC | PRN

## 2021-10-14 MED ORDER — SENNA 8.6 MG PO TABS: 1.0000 | ORAL_TABLET | Freq: Two times a day (BID) | ORAL | 0 refills | Status: DC

## 2021-10-14 MED ORDER — ENOXAPARIN SODIUM 40 MG/0.4ML IJ SOSY: 40.0000 mg | PREFILLED_SYRINGE | INTRAMUSCULAR | 0 refills | Status: DC

## 2021-10-14 MED ORDER — POLYETHYLENE GLYCOL 3350 17 G PO PACK
17.0000 g | PACK | Freq: Two times a day (BID) | ORAL | Status: DC
  Administered 2021-10-14 (×2): 17 g via ORAL
  Filled 2021-10-14 (×3): qty 1

## 2021-10-14 MED ORDER — METHOCARBAMOL 500 MG PO TABS: 500.0000 mg | ORAL_TABLET | Freq: Four times a day (QID) | ORAL | 0 refills | Status: DC | PRN

## 2021-10-15 DIAGNOSIS — K219 Gastro-esophageal reflux disease without esophagitis: Secondary | ICD-10-CM | POA: Diagnosis not present

## 2021-10-15 DIAGNOSIS — S72002S Fracture of unspecified part of neck of left femur, sequela: Secondary | ICD-10-CM | POA: Diagnosis not present

## 2021-10-15 DIAGNOSIS — I447 Left bundle-branch block, unspecified: Secondary | ICD-10-CM | POA: Diagnosis not present

## 2021-10-15 DIAGNOSIS — F329 Major depressive disorder, single episode, unspecified: Secondary | ICD-10-CM | POA: Diagnosis not present

## 2021-10-15 LAB — CBC
HCT: 33.7 % — ABNORMAL LOW (ref 36.0–46.0)
Hemoglobin: 11.1 g/dL — ABNORMAL LOW (ref 12.0–15.0)
MCH: 31.6 pg (ref 26.0–34.0)
MCHC: 32.9 g/dL (ref 30.0–36.0)
MCV: 96 fL (ref 80.0–100.0)
Platelets: 158 10*3/uL (ref 150–400)
RBC: 3.51 MIL/uL — ABNORMAL LOW (ref 3.87–5.11)
RDW: 11.8 % (ref 11.5–15.5)
WBC: 11.6 10*3/uL — ABNORMAL HIGH (ref 4.0–10.5)
nRBC: 0 % (ref 0.0–0.2)

## 2021-10-15 MED ORDER — DOCUSATE SODIUM 100 MG PO CAPS: 100.0000 mg | ORAL_CAPSULE | Freq: Two times a day (BID) | ORAL | 0 refills | Status: AC

## 2021-10-15 MED ORDER — POLYETHYLENE GLYCOL 3350 17 G PO PACK: 17.0000 g | PACK | Freq: Two times a day (BID) | ORAL | 0 refills | Status: DC

## 2021-10-15 MED ORDER — MUPIROCIN 2 % EX OINT
1.0000 | TOPICAL_OINTMENT | Freq: Two times a day (BID) | CUTANEOUS | 0 refills | Status: AC
Start: 2021-10-15 — End: 2021-10-26

## 2021-10-15 NOTE — Discharge Summary (Signed)
Theresa Buck OZD:[id number] DOB: 1956/02/21 DOA: 10/12/2021  PCP: Dale Craigmont, MD  Admit date: 10/12/2021 Discharge date: 10/15/2021  Admitted From: Home Disposition: Home  Recommendations for Outpatient Follow-up:  Follow up with PCP in 1 week Please obtain BMP/CBC in one week Please follow up orthopedics as scheduled  Home Health: Home   Discharge Condition:Stable CODE STATUS: Full Diet recommendation: Heart Healthy  Brief/Interim Summary: Per HPI: Theresa Buck is a 66 y.o. female with medical history significant for SVT status post ablation, history of GERD, anxiety disorder who presents to the ER via EMS for evaluation of left hip pain.Patient states that she got up this morning and was trying to put her shorts on when her feet got tangled up and she fell landing on her left side.Left hip x-ray shows displaced left femoral neck fracture.  Orthopedics was consulted.  She was taken to the OR.  When she was stable she was discharged home.   Femoral neck fracture (HCC) Status post mechanical fall with a femoral neck fracture Immobilize left lower extremity Pain control PT recommends home health Follow-up with [facility] orthopedics in 2 weeks Lovenox 40 mg subcu daily x14 days Remove Prevena and apply honeycomb dressing on 10/21/2021 Weightbearing as tolerated to the left leg   ILBBB (left bundle branch block) Twelve-lead EKG shows a left bundle branch block which appears to be a new finding. cardiology cleared patient for surgery no further cardiac work-up        Supraventricular tachycardia (HCC) Patient has a history of SVT and is status post ablation continue beta-blocker   Gastro-esophageal reflux disease without esophagitis Stable  continue PPI   Depression Continue home meds     Discharge Diagnoses:  Principal Problem:   Femoral neck fracture (HCC) Active Problems:   LBBB (left bundle branch block)   Supraventricular tachycardia (HCC)   Gastro-esophageal  reflux disease without esophagitis   Depression    Discharge Instructions  Discharge Instructions     Call MD for:  temperature >100.4   Complete by: As directed    Diet - low sodium heart healthy   Complete by: As directed    Discharge instructions   Complete by: As directed    Home Prevena unit attached. Patient instructed to remove Prevena in 7-10 days and apply honeycomb dressing.   Weight-Bearing as tolerated to left leg    F/u in 2 weeks with [facility] orthopedics.   Discharge wound care:   Complete by: As directed    As above   Increase activity slowly   Complete by: As directed       Allergies as of 10/15/2021       Reactions   Ceftin [cefuroxime Axetil] Rash        Medication List     STOP taking these medications    fluconazole 150 MG tablet Commonly known as: Diflucan   predniSONE 10 MG (21) Tbpk tablet Commonly known as: STERAPRED UNI-PAK 21 TAB       TAKE these medications    Biotin 5000 MCG Tabs Take 5,000 mcg by mouth daily.   CALCIUM + D3 PO Take 1 tablet daily by mouth.   carboxymethylcellulose 0.5 % Soln Commonly known as: REFRESH PLUS Place 1 drop into both eyes 3 (three) times daily as needed (dry eyes).   docusate sodium 100 MG capsule Commonly known as: COLACE Take 1 capsule (100 mg total) by mouth 2 (two) times daily for 7 days.   enoxaparin 40 MG/0.4ML injection Commonly  known as: LOVENOX Inject 0.4 mLs (40 mg total) into the skin daily for 14 days.   estradiol 0.1 MG/GM vaginal cream Commonly known as: ESTRACE Place 1 Applicatorful vaginally daily.   estradiol 1 MG tablet Commonly known as: ESTRACE TAKE 1/2 TABLET(0.5 MG) BY MOUTH DAILY   HYDROcodone-acetaminophen 5-325 MG tablet Commonly known as: NORCO/VICODIN Take 1-2 tablets by mouth every 6 (six) hours as needed for moderate pain.   hydrocortisone 25 MG suppository Commonly known as: ANUSOL-HC Place 1 suppository (25 mg total) rectally 2 (two) times daily.    methocarbamol 500 MG tablet Commonly known as: ROBAXIN Take 1 tablet (500 mg total) by mouth every 6 (six) hours as needed for muscle spasms.   metoprolol succinate 50 MG 24 hr tablet Commonly known as: TOPROL-XL Take 1 tablet (50 mg total) by mouth daily. Take with or immediately following a meal.   montelukast 10 MG tablet Commonly known as: SINGULAIR Take 10 mg at bedtime by mouth.   multivitamin tablet Take 1 tablet by mouth daily.   mupirocin ointment 2 % Commonly known as: BACTROBAN Place 1 Application into the nose 2 (two) times daily for 11 days.   pantoprazole 40 MG tablet Commonly known as: PROTONIX TAKE 1 TABLET BY MOUTH EVERY DAY   polyethylene glycol 17 g packet Commonly known as: MIRALAX / GLYCOLAX Take 17 g by mouth 2 (two) times daily.   PROBIOTIC ADVANCED PO Take 1 capsule daily by mouth.   progesterone 100 MG capsule Commonly known as: PROMETRIUM Take 100 mg daily by mouth.   senna 8.6 MG Tabs tablet Commonly known as: SENOKOT Take 1 tablet (8.6 mg total) by mouth 2 (two) times daily.   sertraline 50 MG tablet Commonly known as: ZOLOFT 1/2 tablet q day for 2 weeks and then increase to one po q day.   vitamin C 500 MG tablet Commonly known as: ASCORBIC ACID Take 500 mg by mouth daily.               Discharge Care Instructions  (From admission, onward)           Start     Ordered   10/15/21 0000  Discharge wound care:       Comments: As above   10/15/21 1241            Follow-up Information     Evon Slack, PA-C Follow up in 2 week(s).   Specialties: Orthopedic Surgery, Emergency Medicine Contact information: [address] [place] [address] [phone]                Allergies  Allergen Reactions   Ceftin [Cefuroxime Axetil] Rash    Consultations: Orthopedics   Procedures/Studies: DG HIP UNILAT WITH PELVIS 1V LEFT  Result Date: 10/13/2021 CLINICAL DATA:  Left anterior hip replacement  EXAM: DG HIP (WITH OR WITHOUT PELVIS) 1V*L* COMPARISON:  10/12/2021 FINDINGS: Three low resolution intraoperative spot views of the left hip. Total fluoroscopy time was 7.6 seconds, fluoroscopic dose of 0.75 mGy. Images demonstrate a left hip replacement with normal alignment IMPRESSION: Intraoperative fluoroscopic assistance provided during left hip replacement surgery Electronically Signed   By: Jasmine Pang M.D.   On: 10/13/2021 17:47   DG HIP UNILAT W OR W/O PELVIS 2-3 VIEWS LEFT  Result Date: 10/13/2021 CLINICAL DATA:  Status post hip replacement EXAM: DG HIP (WITH OR WITHOUT PELVIS) 2-3V LEFT COMPARISON:  10/12/2021 FINDINGS: Interval left hip replacement with intact hardware and normal alignment. Gas in  the soft tissues consistent with recent surgery IMPRESSION: Interval left hip replacement with expected surgical change Electronically Signed   By: Jasmine Pang M.D.   On: 10/13/2021 17:45   DG C-Arm 1-60 Min-No Report  Result Date: 10/13/2021 Fluoroscopy was utilized by the requesting physician.  No radiographic interpretation.   CT HEAD WO CONTRAST ( )  Result Date: 10/12/2021 CLINICAL DATA:  Larey Seat at home, neck trauma, shortening of LEFT leg, denies loss of consciousness and head injury EXAM: CT HEAD WITHOUT CONTRAST CT CERVICAL SPINE WITHOUT CONTRAST TECHNIQUE: Multidetector CT imaging of the head and cervical spine was performed following the standard protocol without intravenous contrast. Multiplanar CT image reconstructions of the cervical spine were also generated. RADIATION DOSE REDUCTION: This exam was performed according to the departmental dose-optimization program which includes automated exposure control, adjustment of the mA and/or kV according to patient size and/or use of iterative reconstruction technique. COMPARISON:  None FINDINGS: CT HEAD FINDINGS Brain: Normal ventricular morphology. No midline shift or mass effect. Normal appearance of brain parenchyma. No intracranial  hemorrhage, mass lesion, evidence of acute infarction, or extra-axial fluid collection. Vascular: No hyperdense vessels Skull: Intact Sinuses/Orbits: Clear Other: N/A CT CERVICAL SPINE FINDINGS Alignment: Minimal retrolisthesis at C4-C5 and C5-C6. Remaining alignments normal. Skull base and vertebrae: Vertebral body heights maintained. Osseous mineralization normal. Skull base intact. Disc space narrowing with endplate spur formation and sclerosis at C4-C5 and C5-C6, minimally C6-C7. No fracture, additional subluxation, or bone destruction. Uncovertebral spurs encroach upon cervical neural foramina bilaterally at C4-C5 through C6-C7. Soft tissues and spinal canal: Prevertebral soft tissues normal thickness. Disc levels:  No additional abnormalities Upper chest: Minimal biapical scarring Other: N/A IMPRESSION: No acute intracranial abnormalities. Degenerative disc disease changes and uncovertebral degenerative changes at C4-C5 through C6-C7 as above. No acute cervical spine abnormalities. Electronically Signed   By: Ulyses Southward M.D.   On: 10/12/2021 08:27   CT Cervical Spine Wo Contrast  Result Date: 10/12/2021 CLINICAL DATA:  Larey Seat at home, neck trauma, shortening of LEFT leg, denies loss of consciousness and head injury EXAM: CT HEAD WITHOUT CONTRAST CT CERVICAL SPINE WITHOUT CONTRAST TECHNIQUE: Multidetector CT imaging of the head and cervical spine was performed following the standard protocol without intravenous contrast. Multiplanar CT image reconstructions of the cervical spine were also generated. RADIATION DOSE REDUCTION: This exam was performed according to the departmental dose-optimization program which includes automated exposure control, adjustment of the mA and/or kV according to patient size and/or use of iterative reconstruction technique. COMPARISON:  None FINDINGS: CT HEAD FINDINGS Brain: Normal ventricular morphology. No midline shift or mass effect. Normal appearance of brain parenchyma. No  intracranial hemorrhage, mass lesion, evidence of acute infarction, or extra-axial fluid collection. Vascular: No hyperdense vessels Skull: Intact Sinuses/Orbits: Clear Other: N/A CT CERVICAL SPINE FINDINGS Alignment: Minimal retrolisthesis at C4-C5 and C5-C6. Remaining alignments normal. Skull base and vertebrae: Vertebral body heights maintained. Osseous mineralization normal. Skull base intact. Disc space narrowing with endplate spur formation and sclerosis at C4-C5 and C5-C6, minimally C6-C7. No fracture, additional subluxation, or bone destruction. Uncovertebral spurs encroach upon cervical neural foramina bilaterally at C4-C5 through C6-C7. Soft tissues and spinal canal: Prevertebral soft tissues normal thickness. Disc levels:  No additional abnormalities Upper chest: Minimal biapical scarring Other: N/A IMPRESSION: No acute intracranial abnormalities. Degenerative disc disease changes and uncovertebral degenerative changes at C4-C5 through C6-C7 as above. No acute cervical spine abnormalities. Electronically Signed   By: Ulyses Southward M.D.   On: 10/12/2021 08:27  DG Chest Portable 1 View  Result Date: 10/12/2021 CLINICAL DATA:  Larey Seat.  Left hip fracture. EXAM: PORTABLE CHEST 1 VIEW COMPARISON:  05/16/2021 FINDINGS: The cardiac silhouette, mediastinal and hilar contours are normal. The lungs are clear. No pleural effusions. No pulmonary lesions. The bony thorax is intact. IMPRESSION: No acute cardiopulmonary findings. Electronically Signed   By: Rudie Meyer M.D.   On: 10/12/2021 08:09   DG Hip Unilat W or Wo Pelvis 2-3 Views Left  Result Date: 10/12/2021 CLINICAL DATA:  Larey Seat.  Left hip pain. EXAM: DG HIP (WITH OR WITHOUT PELVIS) 2-3V LEFT COMPARISON:  None Available. FINDINGS: There is a displaced left femoral neck fracture on the left side. The right hip is intact. The pubic symphysis and SI joints are intact. No pelvic fractures. IMPRESSION: Displaced left femoral neck fracture. Electronically Signed    By: Rudie Meyer M.D.   On: 10/12/2021 08:08      Subjective: Pain controlled.  No shortness of breath or chest pain.  Doing better today  Discharge Exam: Vitals:   10/15/21 0810 10/15/21 1257  BP: 111/79 116/75  Pulse: 89 89  Resp: 16 16  Temp: 98 F (36.7 C) (!) 97.4 F (36.3 C)  SpO2: 96% 100%   Vitals:   10/14/21 2325 10/15/21 0443 10/15/21 0810 10/15/21 1257  BP: 118/70 114/72 111/79 116/75  Pulse: 85 88 89 89  Resp: 17 17 16 16   Temp: 98.2 F (36.8 C) 97.9 F (36.6 C) 98 F (36.7 C) (!) 97.4 F (36.3 C)  TempSrc:      SpO2: 99% 98% 96% 100%  Weight:      Height:        General: Pt is alert, awake, not in acute distress Cardiovascular: RRR, S1/S2 +, no rubs, no gallops Respiratory: CTA bilaterally, no wheezing, no rhonchi Abdominal: Soft, NT, ND, bowel sounds + Extremities: no edema, no cyanosis    The results of significant diagnostics from this hospitalization (including imaging, microbiology, ancillary and laboratory) are listed below for reference.     Microbiology: Recent Results (from the past 240 hour(s))  Surgical PCR screen     Status: None   Collection Time: 10/12/21  4:00 PM   Specimen: Nasal Mucosa; Nasal Swab  Result Value Ref Range Status   MRSA, PCR NEGATIVE NEGATIVE Final   Staphylococcus aureus NEGATIVE NEGATIVE Final    Comment: (NOTE) The Xpert SA Assay (FDA approved for NASAL specimens in patients 45 years of age and older), is one component of a comprehensive surveillance program. It is not intended to diagnose infection nor to guide or monitor treatment. Performed at [facility], [address] [place], [place] [address]      Labs: BNP (last 3 results) No results for input(s): "BNP" in the last 8760 hours. Basic Metabolic Panel: Recent Labs  Lab 10/12/21 0725 10/13/21 2013 10/14/21 0538  NA 140  --  137  K 3.9  --  4.3  CL 107  --  108  CO2 28  --  25  GLUCOSE 121*  --  135*  BUN 17  --  9   CREATININE 0.67 0.41* 0.50  CALCIUM 9.3  --  8.6*   Liver Function Tests: Recent Labs  Lab 10/12/21 0725  AST 20  ALT 18  ALKPHOS 65  BILITOT 0.6  PROT 7.3  ALBUMIN 4.1   No results for input(s): "LIPASE", "AMYLASE" in the last 168 hours. No results for input(s): "AMMONIA" in the last 168 hours.  CBC: Recent Labs  Lab 10/12/21 0725 10/13/21 2013 10/14/21 0538 10/15/21 0502  WBC 7.2 12.2* 14.9* 11.6*  NEUTROABS 4.6  --   --   --   HGB 14.3 10.7* 12.7 11.1*  HCT 43.3 33.1* 39.0 33.7*  MCV 96.7 98.5 96.5 96.0  PLT 227 132* 187 158   Cardiac Enzymes: No results for input(s): "CKTOTAL", "CKMB", "CKMBINDEX", "TROPONINI" in the last 168 hours. BNP: Invalid input(s): "POCBNP" CBG: No results for input(s): "GLUCAP" in the last 168 hours. D-Dimer No results for input(s): "DDIMER" in the last 72 hours. Hgb A1c No results for input(s): "HGBA1C" in the last 72 hours. Lipid Profile No results for input(s): "CHOL", "HDL", "LDLCALC", "TRIG", "CHOLHDL", "LDLDIRECT" in the last 72 hours. Thyroid function studies No results for input(s): "TSH", "T4TOTAL", "T3FREE", "THYROIDAB" in the last 72 hours.  Invalid input(s): "FREET3" Anemia work up No results for input(s): "VITAMINB12", "FOLATE", "FERRITIN", "TIBC", "IRON", "RETICCTPCT" in the last 72 hours. Urinalysis    Component Value Date/Time   COLORURINE Yellow 09/22/2013 2206   APPEARANCEUR Clear 09/22/2013 2206   LABSPEC 1.014 09/22/2013 2206   PHURINE 6.0 09/22/2013 2206   GLUCOSEU Negative 09/22/2013 2206   HGBUR Negative 09/22/2013 2206   BILIRUBINUR negative 12/15/2020 1952   BILIRUBINUR Negative 09/22/2013 2206   KETONESUR negative 12/15/2020 1952   KETONESUR Negative 09/22/2013 2206   PROTEINUR negative 12/15/2020 1952   PROTEINUR Negative 09/22/2013 2206   UROBILINOGEN 0.2 12/15/2020 1952   NITRITE Negative 12/15/2020 1952   NITRITE Negative 09/22/2013 2206   LEUKOCYTESUR Negative 12/15/2020 1952   LEUKOCYTESUR  Negative 09/22/2013 2206   Sepsis Labs Recent Labs  Lab 10/12/21 0725 10/13/21 2013 10/14/21 0538 10/15/21 0502  WBC 7.2 12.2* 14.9* 11.6*   Microbiology Recent Results (from the past 240 hour(s))  Surgical PCR screen     Status: None   Collection Time: 10/12/21  4:00 PM   Specimen: Nasal Mucosa; Nasal Swab  Result Value Ref Range Status   MRSA, PCR NEGATIVE NEGATIVE Final   Staphylococcus aureus NEGATIVE NEGATIVE Final    Comment: (NOTE) The Xpert SA Assay (FDA approved for NASAL specimens in patients 63 years of age and older), is one component of a comprehensive surveillance program. It is not intended to diagnose infection nor to guide or monitor treatment. Performed at [facility], [address] [place], [place] [address]      Time coordinating discharge: Over 30 minutes  SIGNED:   Lynn Ito, MD  [facility] 10/16/2021, 5:22 PM Pager   If 7PM-7AM, please contact night-coverage [email or web address] Password [email or web address]

## 2021-10-16 ENCOUNTER — Emergency Department: Payer: BC Managed Care – PPO

## 2021-10-16 ENCOUNTER — Other Ambulatory Visit: Payer: Self-pay

## 2021-10-16 DIAGNOSIS — Z8616 Personal history of COVID-19: Secondary | ICD-10-CM | POA: Diagnosis not present

## 2021-10-16 DIAGNOSIS — K5900 Constipation, unspecified: Secondary | ICD-10-CM | POA: Insufficient documentation

## 2021-10-16 DIAGNOSIS — R1030 Lower abdominal pain, unspecified: Secondary | ICD-10-CM | POA: Diagnosis not present

## 2021-10-16 LAB — COMPREHENSIVE METABOLIC PANEL
ALT: 19 U/L (ref 0–44)
AST: 20 U/L (ref 15–41)
Albumin: 3.6 g/dL (ref 3.5–5.0)
Alkaline Phosphatase: 67 U/L (ref 38–126)
Anion gap: 9 (ref 5–15)
BUN: 11 mg/dL (ref 8–23)
CO2: 23 mmol/L (ref 22–32)
Calcium: 9.1 mg/dL (ref 8.9–10.3)
Chloride: 102 mmol/L (ref 98–111)
Creatinine, Ser: 0.48 mg/dL (ref 0.44–1.00)
GFR, Estimated: >60 mL/min (ref >60)
Glucose, Bld: 152 mg/dL — ABNORMAL HIGH (ref 70–99)
Potassium: 4 mmol/L (ref 3.5–5.1)
Sodium: 134 mmol/L — ABNORMAL LOW (ref 135–145)
Total Bilirubin: 1.1 mg/dL (ref 0.3–1.2)
Total Protein: 6.9 g/dL (ref 6.5–8.1)

## 2021-10-16 LAB — CBC
HCT: 35.2 % — ABNORMAL LOW (ref 36.0–46.0)
Hemoglobin: 11.5 g/dL — ABNORMAL LOW (ref 12.0–15.0)
MCH: 31.2 pg (ref 26.0–34.0)
MCHC: 32.7 g/dL (ref 30.0–36.0)
MCV: 95.4 fL (ref 80.0–100.0)
Platelets: 249 10*3/uL (ref 150–400)
RBC: 3.69 MIL/uL — ABNORMAL LOW (ref 3.87–5.11)
RDW: 11.7 % (ref 11.5–15.5)
WBC: 15.6 10*3/uL — ABNORMAL HIGH (ref 4.0–10.5)
nRBC: 0 % (ref 0.0–0.2)

## 2021-10-16 LAB — LIPASE, BLOOD: Lipase: 27 U/L (ref 11–51)

## 2021-10-17 ENCOUNTER — Emergency Department
Admission: EM | Admit: 2021-10-17 | Discharge: 2021-10-17 | Disposition: A | Discharge status: Home / Self Care | Payer: BC Managed Care – PPO | Attending: Emergency Medicine | Admitting: Emergency Medicine

## 2021-10-17 ENCOUNTER — Telehealth: Payer: Self-pay

## 2021-10-17 DIAGNOSIS — R1030 Lower abdominal pain, unspecified: Secondary | ICD-10-CM | POA: Diagnosis not present

## 2021-10-17 DIAGNOSIS — K5900 Constipation, unspecified: Secondary | ICD-10-CM | POA: Diagnosis not present

## 2021-10-17 LAB — SURGICAL PATHOLOGY

## 2021-10-17 MED ORDER — DOCUSATE SODIUM 50 MG/5ML PO LIQD
100.0000 mg | Freq: Once | ORAL | Status: AC
  Administered 2021-10-17: 100 mg via ORAL
  Filled 2021-10-17: qty 10

## 2021-10-17 MED ORDER — LACTULOSE 10 GM/15ML PO SOLN
30.0000 g | Freq: Once | ORAL | Status: AC
  Administered 2021-10-17: 30 g via ORAL
  Filled 2021-10-17: qty 60

## 2021-10-17 MED ORDER — LIDOCAINE VISCOUS HCL 2 % MT SOLN
15.0000 mL | Freq: Once | OROMUCOSAL | Status: AC
  Administered 2021-10-17: 15 mL via OROMUCOSAL
  Filled 2021-10-17: qty 15

## 2021-10-17 MED ORDER — MINERAL OIL RE ENEM
1.0000 | ENEMA | Freq: Once | RECTAL | Status: AC
  Administered 2021-10-17: 1 via RECTAL

## 2021-10-17 MED ORDER — LACTULOSE 10 GM/15ML PO SOLN: 20.0000 g | Freq: Every day | ORAL | 0 refills | Status: DC | PRN

## 2021-10-18 DIAGNOSIS — I447 Left bundle-branch block, unspecified: Secondary | ICD-10-CM | POA: Diagnosis not present

## 2021-10-18 DIAGNOSIS — F32A Depression, unspecified: Secondary | ICD-10-CM | POA: Diagnosis not present

## 2021-10-18 DIAGNOSIS — Z471 Aftercare following joint replacement surgery: Secondary | ICD-10-CM | POA: Diagnosis not present

## 2021-10-18 DIAGNOSIS — S72009A Fracture of unspecified part of neck of unspecified femur, initial encounter for closed fracture: Secondary | ICD-10-CM | POA: Diagnosis not present

## 2021-10-18 DIAGNOSIS — M549 Dorsalgia, unspecified: Secondary | ICD-10-CM | POA: Diagnosis not present

## 2021-10-19 NOTE — Telephone Encounter (Signed)
I spoke with Theresa Buck & she stated that if I redid letter I could just send through patient's mychart. I have redone this with correct name & sent to her mychart so that they may print.

## 2021-10-19 NOTE — Telephone Encounter (Signed)
S/w pt son wife - stated pt needs letter from [place] for being absent from work when Ms Apps was in ED, and when he brings her here for f/u on 7/7. It is not FMLA, just 3 personal leave days. 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 Pt wife stated needs letter by Friday, and she will pick up. Letter drafted, and printed. Placed in quick sign box.

## 2021-10-19 NOTE — Telephone Encounter (Signed)
Letter printed and placed in [place] box for signature.

## 2021-10-19 NOTE — Telephone Encounter (Signed)
Pt wife called stating pt son came to pick up letter but it did not have his correct last name on the letter. Pt wife said it had [employer] and not [employer]

## 2021-10-19 NOTE — Telephone Encounter (Signed)
Dr Nicki Reaper has signed and I have placed upfront for Kendall to pick up. I have called & notified her of this.

## 2021-10-19 NOTE — Telephone Encounter (Signed)
Pt called in requesting update on FMLA letter... Pt requesting callback

## 2021-10-19 NOTE — Telephone Encounter (Signed)
Patient states that FMLA letter for her son, Theresa Buck, is needed today for his place of employment.  Patient states that her daughter-in-law, Karelyn Brisby, will be picking up letter.  Patient requests we please call Orlena Garmon at [phone] when letter is ready.

## 2021-10-21 DIAGNOSIS — F32A Depression, unspecified: Secondary | ICD-10-CM | POA: Diagnosis not present

## 2021-10-21 DIAGNOSIS — M549 Dorsalgia, unspecified: Secondary | ICD-10-CM | POA: Diagnosis not present

## 2021-10-21 DIAGNOSIS — S72009A Fracture of unspecified part of neck of unspecified femur, initial encounter for closed fracture: Secondary | ICD-10-CM | POA: Diagnosis not present

## 2021-10-21 DIAGNOSIS — I447 Left bundle-branch block, unspecified: Secondary | ICD-10-CM | POA: Diagnosis not present

## 2021-10-21 DIAGNOSIS — Z471 Aftercare following joint replacement surgery: Secondary | ICD-10-CM | POA: Diagnosis not present

## 2021-10-24 DIAGNOSIS — M549 Dorsalgia, unspecified: Secondary | ICD-10-CM | POA: Diagnosis not present

## 2021-10-24 DIAGNOSIS — F32A Depression, unspecified: Secondary | ICD-10-CM | POA: Diagnosis not present

## 2021-10-24 DIAGNOSIS — S72009A Fracture of unspecified part of neck of unspecified femur, initial encounter for closed fracture: Secondary | ICD-10-CM | POA: Diagnosis not present

## 2021-10-24 DIAGNOSIS — I447 Left bundle-branch block, unspecified: Secondary | ICD-10-CM | POA: Diagnosis not present

## 2021-10-24 DIAGNOSIS — Z471 Aftercare following joint replacement surgery: Secondary | ICD-10-CM | POA: Diagnosis not present

## 2021-10-27 ENCOUNTER — Encounter: Payer: Self-pay | Admitting: Obstetrics

## 2021-10-27 DIAGNOSIS — S72009A Fracture of unspecified part of neck of unspecified femur, initial encounter for closed fracture: Secondary | ICD-10-CM | POA: Diagnosis not present

## 2021-10-27 DIAGNOSIS — Z471 Aftercare following joint replacement surgery: Secondary | ICD-10-CM | POA: Diagnosis not present

## 2021-10-27 DIAGNOSIS — M549 Dorsalgia, unspecified: Secondary | ICD-10-CM | POA: Diagnosis not present

## 2021-10-27 DIAGNOSIS — I447 Left bundle-branch block, unspecified: Secondary | ICD-10-CM | POA: Diagnosis not present

## 2021-10-27 DIAGNOSIS — F32A Depression, unspecified: Secondary | ICD-10-CM | POA: Diagnosis not present

## 2021-10-28 ENCOUNTER — Ambulatory Visit: Payer: BC Managed Care – PPO | Admitting: Internal Medicine

## 2021-10-28 ENCOUNTER — Encounter: Payer: Self-pay | Admitting: Internal Medicine

## 2021-10-28 VITALS — BP 108/66 | HR 94 | Temp 98.2°F | Resp 18 | Ht 66.5 in | Wt 141.2 lb

## 2021-10-28 DIAGNOSIS — I447 Left bundle-branch block, unspecified: Secondary | ICD-10-CM | POA: Diagnosis not present

## 2021-10-28 DIAGNOSIS — Z0289 Encounter for other administrative examinations: Secondary | ICD-10-CM

## 2021-10-28 DIAGNOSIS — I471 Supraventricular tachycardia: Secondary | ICD-10-CM

## 2021-10-28 DIAGNOSIS — K219 Gastro-esophageal reflux disease without esophagitis: Secondary | ICD-10-CM

## 2021-10-28 DIAGNOSIS — K5900 Constipation, unspecified: Secondary | ICD-10-CM

## 2021-10-28 DIAGNOSIS — D72829 Elevated white blood cell count, unspecified: Secondary | ICD-10-CM

## 2021-10-28 DIAGNOSIS — S72002S Fracture of unspecified part of neck of left femur, sequela: Secondary | ICD-10-CM

## 2021-10-28 LAB — BASIC METABOLIC PANEL
BUN: 18 mg/dL (ref 6–23)
CO2: 29 mEq/L (ref 19–32)
Calcium: 9.2 mg/dL (ref 8.4–10.5)
Chloride: 101 mEq/L (ref 96–112)
Creatinine, Ser: 0.71 mg/dL (ref 0.40–1.20)
GFR: 89.65 mL/min (ref >60.00)
Glucose, Bld: 133 mg/dL — ABNORMAL HIGH (ref 70–99)
Potassium: 4 mEq/L (ref 3.5–5.1)
Sodium: 138 mEq/L (ref 135–145)

## 2021-10-28 LAB — CBC WITH DIFFERENTIAL/PLATELET
Basophils Absolute: 0.1 10*3/uL (ref 0.0–0.1)
Basophils Relative: 0.9 % (ref 0.0–3.0)
Eosinophils Absolute: 0.2 10*3/uL (ref 0.0–0.7)
Eosinophils Relative: 2 % (ref 0.0–5.0)
HCT: 33.9 % — ABNORMAL LOW (ref 36.0–46.0)
Hemoglobin: 11.2 g/dL — ABNORMAL LOW (ref 12.0–15.0)
Lymphocytes Relative: 16.5 % (ref 12.0–46.0)
Lymphs Abs: 1.5 10*3/uL (ref 0.7–4.0)
MCHC: 33.2 g/dL (ref 30.0–36.0)
MCV: 96.7 fl (ref 78.0–100.0)
Monocytes Absolute: 0.6 10*3/uL (ref 0.1–1.0)
Monocytes Relative: 6 % (ref 3.0–12.0)
Neutro Abs: 7 10*3/uL (ref 1.4–7.7)
Neutrophils Relative %: 74.6 % (ref 43.0–77.0)
Platelets: 401 10*3/uL — ABNORMAL HIGH (ref 150.0–400.0)
RBC: 3.5 Mil/uL — ABNORMAL LOW (ref 3.87–5.11)
RDW: 13.2 % (ref 11.5–15.5)
WBC: 9.3 10*3/uL (ref 4.0–10.5)

## 2021-10-28 NOTE — Progress Notes (Unsigned)
Patient ID: RAESHA COONROD, female   DOB: 11/26/1955, 66 y.o.   MRN: [id number]   Subjective:    Patient ID: LAKIAH DHINGRA, female    DOB: [birth year], 66 y.o.   MRN: [id number]   Patient here for hospital follow up.  Marland Kitchen   HPI Hospitalized 10/12/21 - 10/15/21 - left femoral neck fracture.  She is accompanied by her daughter-n-law.  History obtained from both of them.  Fracture  after falling - feet tangled getting dressed.  She was taken to the OR.  S/p total hip arthroplasty anterior approach. PT recommended home health therapy.  After discharge, she had problems with having a bowel movement.  Was see in ER 10/17/21 - disimpacted.  Good results after second enema.  Bowels are moving now.  She has started home PT - 2 days per week.  Staying with her son and his wife.  Walking with walker.  Started cane yesterday.  Daughter-n-law is making sure she gets up and walks, etc.  Completes course of lovenox.  Two more doses.  Taking ibuprofen and tylenol now for pain.  Eating.  No nausea or vomiting.  No chest pain.  No increased heart rate or palpitations reported.  Per review of hospital records, was seen by cardiology for LBBB on EKG.  Has f/u with cardiology at the end of this month.     Past Medical History:  Diagnosis Date   Allergy    recurring sinus problems   Anxiety    Arthritis    Chronic back pain    Deviated septum    Deviated septum    GERD (gastroesophageal reflux disease)    H/O sinusitis    Headache    H/O MIGRAINES   Hx of degenerative disc disease    Scoliosis    SVT (supraventricular tachycardia) ([place])    Past Surgical History:  Procedure Laterality Date   BACK SURGERY  09/26/2013   LUMBAR   COLONOSCOPY  08/03/2015   Dr Vira Agar   HEMORRHOID SURGERY N/A 03/13/2017   Procedure: HEMORRHOIDECTOMY;  Surgeon: Christene Lye, MD;  Location: [facility];  Service: General;  Laterality: N/A;   SVT ABLATION N/A 09/16/2020   Procedure: SVT ABLATION;  Surgeon: Vickie Epley, MD;  Location: [facility];  Service: Cardiovascular;  Laterality: N/A;   TOTAL HIP ARTHROPLASTY Left 10/13/2021   Procedure: TOTAL HIP ARTHROPLASTY ANTERIOR APPROACH;  Surgeon: Hessie Knows, MD;  Location: [facility];  Service: Orthopedics;  Laterality: Left;   UPPER GI ENDOSCOPY  08/03/2015   Dr Vira Agar   Family History  Problem Relation Age of Onset   Brain cancer Mother    ALS Father    Diabetes Brother        multiple   Heart disease Son        myocardial infarction, s/p CABG   Throat cancer Brother    Breast cancer Neg Hx    Social History   Socioeconomic History   Marital status: Widowed    Spouse name: Not on file   Number of children: 3   Years of education: Not on file   Highest education level: Not on file  Occupational History   Not on file  Tobacco Use   Smoking status: Never   Smokeless tobacco: Never  Vaping Use   Vaping Use: Never used  Substance and Sexual Activity   Alcohol use: No    Alcohol/week: 0.0 standard drinks of alcohol   Drug use: No  Sexual activity: Not on file  Other Topics Concern   Not on file  Social History Narrative   Not on file   Social Determinants of Health   Financial Resource Strain: Not on file  Food Insecurity: Not on file  Transportation Needs: Not on file  Physical Activity: Not on file  Stress: Not on file  Social Connections: Not on file     Review of Systems  Constitutional:  Negative for appetite change and unexpected weight change.  HENT:  Negative for congestion and sinus pressure.   Respiratory:  Negative for cough, chest tightness and shortness of breath.   Cardiovascular:  Negative for chest pain, palpitations and leg swelling.  Gastrointestinal:  Negative for nausea and vomiting.       Abdomina discomfort and bowels better.   Genitourinary:  Negative for difficulty urinating and dysuria.  Musculoskeletal:  Negative for myalgias.       Left hip fracture = pain being controlled with ibuprofen  and tylenol.   Skin:  Negative for color change and rash.  Neurological:  Negative for dizziness, light-headedness and headaches.  Psychiatric/Behavioral:  Negative for agitation and dysphoric mood.        Objective:     BP 108/66 (BP Location: Left Arm, Patient Position: Sitting, Cuff Size: Small)   Pulse 94   Temp 98.2 F (36.8 C) (Temporal)   Resp 18   Ht 5' 6.5" (1.689 m)   Wt 141 lb 3.2 oz (64 kg)   LMP 07/15/2012   SpO2 98%   BMI 22.45 kg/m  Wt Readings from Last 3 Encounters:  10/28/21 141 lb 3.2 oz (64 kg)  10/16/21 140 lb (63.5 kg)  10/13/21 141 lb 1.5 oz (64 kg)    Physical Exam   Outpatient Encounter Medications as of 10/28/2021  Medication Sig   Biotin 5000 MCG TABS Take 5,000 mcg by mouth daily.   Calcium Carb-Cholecalciferol (CALCIUM + D3 PO) Take 1 tablet daily by mouth.   carboxymethylcellulose (REFRESH PLUS) 0.5 % SOLN Place 1 drop into both eyes 3 (three) times daily as needed (dry eyes).   enoxaparin (LOVENOX) 40 MG/0.4ML injection Inject 0.4 mLs (40 mg total) into the skin daily for 14 days.   estradiol (ESTRACE) 0.1 MG/GM vaginal cream Place 1 Applicatorful vaginally daily.   estradiol (ESTRACE) 1 MG tablet TAKE 1/2 TABLET(0.5 MG) BY MOUTH DAILY   hydrocortisone (ANUSOL-HC) 25 MG suppository Place 1 suppository (25 mg total) rectally 2 (two) times daily.   metoprolol succinate (TOPROL-XL) 50 MG 24 hr tablet Take 1 tablet (50 mg total) by mouth daily. Take with or immediately following a meal.   montelukast (SINGULAIR) 10 MG tablet Take 10 mg at bedtime by mouth.    Multiple Vitamin (MULTIVITAMIN) tablet Take 1 tablet by mouth daily.   pantoprazole (PROTONIX) 40 MG tablet TAKE 1 TABLET BY MOUTH EVERY DAY   Probiotic Product (PROBIOTIC ADVANCED PO) Take 1 capsule daily by mouth.    progesterone (PROMETRIUM) 100 MG capsule Take 100 mg daily by mouth.   vitamin C (ASCORBIC ACID) 500 MG tablet Take 500 mg by mouth daily.   [DISCONTINUED]  HYDROcodone-acetaminophen (NORCO/VICODIN) 5-325 MG tablet Take 1-2 tablets by mouth every 6 (six) hours as needed for moderate pain. (Patient not taking: Reported on 10/28/2021)   [DISCONTINUED] lactulose (CHRONULAC) 10 GM/15ML solution Take 30 mLs (20 g total) by mouth daily as needed for mild constipation. (Patient not taking: Reported on 10/28/2021)   [DISCONTINUED] methocarbamol (ROBAXIN) 500 MG tablet  Take 1 tablet (500 mg total) by mouth every 6 (six) hours as needed for muscle spasms. (Patient not taking: Reported on 10/28/2021)   [DISCONTINUED] polyethylene glycol (MIRALAX / GLYCOLAX) 17 g packet Take 17 g by mouth 2 (two) times daily. (Patient not taking: Reported on 10/28/2021)   [DISCONTINUED] senna (SENOKOT) 8.6 MG TABS tablet Take 1 tablet (8.6 mg total) by mouth 2 (two) times daily. (Patient not taking: Reported on 10/28/2021)   [DISCONTINUED] sertraline (ZOLOFT) 50 MG tablet 1/2 tablet q day for 2 weeks and then increase to one po q day. (Patient not taking: Reported on 10/12/2021)   No facility-administered encounter medications on file as of 10/28/2021.     Lab Results  Component Value Date   WBC 9.3 10/28/2021   HGB 11.2 (L) 10/28/2021   HCT 33.9 (L) 10/28/2021   PLT 401.0 (H) 10/28/2021   GLUCOSE 133 (H) 10/28/2021   CHOL 192 07/14/2021   TRIG 77.0 07/14/2021   HDL 53.90 07/14/2021   LDLCALC 123 (H) 07/14/2021   ALT 19 10/16/2021   AST 20 10/16/2021   NA 138 10/28/2021   K 4.0 10/28/2021   CL 101 10/28/2021   CREATININE 0.71 10/28/2021   BUN 18 10/28/2021   CO2 29 10/28/2021   TSH 1.98 07/14/2021   INR 1.0 10/12/2021       Assessment & Plan:   Problem List Items Addressed This Visit     Constipation    Had significant problems recently with her bowels.  ER visit as outlined.  Doing well now.  Off narcotic pain medication.  Follow.       Encounter for completion of form with patient    FMLA form signed for her son.       Femoral neck fracture (HCC)    S/p  mechanical fall with resulting femoral neck fracture.  S/p surgery as outlined.  Has just started home health PT.  Walking with walker.  Starting to use a cane.  Follow.  Keep f/u with ortho.       Gastro-esophageal reflux disease without esophagitis    No upper symptoms reported. On protonix.        LBBB (left bundle branch block)    Per review, EKG hospital - LBBB.  Cardiology consulted for pre op evaluation.  Cleared for surgery.  Has history of SVT and has been seeing Dr Rockey Situ.  Has f/u planned end of this month.  No chest pain, increased heart rate, palpitations or sob reported.  Follow.       Leukocytosis    Noted while in hospital.  Recheck cbc to confirm wnl.       Relevant Orders   CBC w/Diff (Completed)   Supraventricular tachycardia ([place]) - Primary    S/p ablation.  Continue metoprolol.       Relevant Orders   Basic Metabolic Panel (BMET) (Completed)     Einar Pheasant, MD

## 2021-10-29 ENCOUNTER — Other Ambulatory Visit: Payer: Self-pay | Admitting: Internal Medicine

## 2021-10-29 ENCOUNTER — Encounter: Payer: Self-pay | Admitting: Internal Medicine

## 2021-10-29 DIAGNOSIS — Z0289 Encounter for other administrative examinations: Secondary | ICD-10-CM | POA: Insufficient documentation

## 2021-10-29 DIAGNOSIS — D649 Anemia, unspecified: Secondary | ICD-10-CM

## 2021-10-29 NOTE — Assessment & Plan Note (Signed)
S/p ablation.  Continue metoprolol.

## 2021-10-29 NOTE — Assessment & Plan Note (Signed)
FMLA form signed for her son.

## 2021-10-29 NOTE — Assessment & Plan Note (Signed)
Had significant problems recently with her bowels.  ER visit as outlined.  Doing well now.  Off narcotic pain medication.  Follow.

## 2021-10-29 NOTE — Progress Notes (Signed)
Order placed for f/u labs.  

## 2021-10-29 NOTE — Assessment & Plan Note (Signed)
No upper symptoms reported.  On protonix.   

## 2021-10-29 NOTE — Assessment & Plan Note (Signed)
S/p mechanical fall with resulting femoral neck fracture.  S/p surgery as outlined.  Has just started home health PT.  Walking with walker.  Starting to use a cane.  Follow.  Keep f/u with ortho.

## 2021-10-29 NOTE — Assessment & Plan Note (Signed)
Per review, EKG hospital - LBBB.  Cardiology consulted for pre op evaluation.  Cleared for surgery.  Has history of SVT and has been seeing Dr Rockey Situ.  Has f/u planned end of this month.  No chest pain, increased heart rate, palpitations or sob reported.  Follow.

## 2021-10-29 NOTE — Assessment & Plan Note (Signed)
Noted while in hospital.  Recheck cbc to confirm wnl.

## 2021-10-31 NOTE — Telephone Encounter (Signed)
Delilah Shan advised husband paperwork ready. Can pick up any time.  Left upfront for Nat to pick up

## 2021-10-31 NOTE — Telephone Encounter (Signed)
Pt called in requesting update on FMLA paperwork.... Pt requesting callback.Marland KitchenMarland Kitchen

## 2021-11-01 DIAGNOSIS — F32A Depression, unspecified: Secondary | ICD-10-CM | POA: Diagnosis not present

## 2021-11-01 DIAGNOSIS — I447 Left bundle-branch block, unspecified: Secondary | ICD-10-CM | POA: Diagnosis not present

## 2021-11-01 DIAGNOSIS — Z471 Aftercare following joint replacement surgery: Secondary | ICD-10-CM | POA: Diagnosis not present

## 2021-11-01 DIAGNOSIS — S72009A Fracture of unspecified part of neck of unspecified femur, initial encounter for closed fracture: Secondary | ICD-10-CM | POA: Diagnosis not present

## 2021-11-01 DIAGNOSIS — M549 Dorsalgia, unspecified: Secondary | ICD-10-CM | POA: Diagnosis not present

## 2021-11-03 DIAGNOSIS — I447 Left bundle-branch block, unspecified: Secondary | ICD-10-CM | POA: Diagnosis not present

## 2021-11-03 DIAGNOSIS — F32A Depression, unspecified: Secondary | ICD-10-CM | POA: Diagnosis not present

## 2021-11-03 DIAGNOSIS — S72009A Fracture of unspecified part of neck of unspecified femur, initial encounter for closed fracture: Secondary | ICD-10-CM | POA: Diagnosis not present

## 2021-11-03 DIAGNOSIS — Z471 Aftercare following joint replacement surgery: Secondary | ICD-10-CM | POA: Diagnosis not present

## 2021-11-03 DIAGNOSIS — M549 Dorsalgia, unspecified: Secondary | ICD-10-CM | POA: Diagnosis not present

## 2021-11-04 ENCOUNTER — Encounter: Payer: Self-pay | Admitting: Obstetrics

## 2021-11-07 DIAGNOSIS — I471 Supraventricular tachycardia: Secondary | ICD-10-CM | POA: Diagnosis not present

## 2021-11-07 DIAGNOSIS — M50821 Other cervical disc disorders at C4-C5 level: Secondary | ICD-10-CM | POA: Diagnosis not present

## 2021-11-07 DIAGNOSIS — K219 Gastro-esophageal reflux disease without esophagitis: Secondary | ICD-10-CM | POA: Diagnosis not present

## 2021-11-07 DIAGNOSIS — F419 Anxiety disorder, unspecified: Secondary | ICD-10-CM | POA: Diagnosis not present

## 2021-11-07 DIAGNOSIS — Z9181 History of falling: Secondary | ICD-10-CM | POA: Diagnosis not present

## 2021-11-07 DIAGNOSIS — Z79890 Hormone replacement therapy (postmenopausal): Secondary | ICD-10-CM | POA: Diagnosis not present

## 2021-11-07 DIAGNOSIS — I447 Left bundle-branch block, unspecified: Secondary | ICD-10-CM | POA: Diagnosis not present

## 2021-11-07 DIAGNOSIS — Z79891 Long term (current) use of opiate analgesic: Secondary | ICD-10-CM | POA: Diagnosis not present

## 2021-11-07 DIAGNOSIS — S72002D Fracture of unspecified part of neck of left femur, subsequent encounter for closed fracture with routine healing: Secondary | ICD-10-CM | POA: Diagnosis not present

## 2021-11-07 DIAGNOSIS — M549 Dorsalgia, unspecified: Secondary | ICD-10-CM | POA: Diagnosis not present

## 2021-11-07 DIAGNOSIS — G8929 Other chronic pain: Secondary | ICD-10-CM | POA: Diagnosis not present

## 2021-11-07 DIAGNOSIS — M419 Scoliosis, unspecified: Secondary | ICD-10-CM | POA: Diagnosis not present

## 2021-11-07 DIAGNOSIS — F32A Depression, unspecified: Secondary | ICD-10-CM | POA: Diagnosis not present

## 2021-11-07 DIAGNOSIS — M1990 Unspecified osteoarthritis, unspecified site: Secondary | ICD-10-CM | POA: Diagnosis not present

## 2021-11-07 DIAGNOSIS — G40909 Epilepsy, unspecified, not intractable, without status epilepticus: Secondary | ICD-10-CM | POA: Diagnosis not present

## 2021-11-09 DIAGNOSIS — G40909 Epilepsy, unspecified, not intractable, without status epilepticus: Secondary | ICD-10-CM | POA: Diagnosis not present

## 2021-11-09 DIAGNOSIS — S72002D Fracture of unspecified part of neck of left femur, subsequent encounter for closed fracture with routine healing: Secondary | ICD-10-CM | POA: Diagnosis not present

## 2021-11-09 DIAGNOSIS — F32A Depression, unspecified: Secondary | ICD-10-CM | POA: Diagnosis not present

## 2021-11-09 DIAGNOSIS — M419 Scoliosis, unspecified: Secondary | ICD-10-CM | POA: Diagnosis not present

## 2021-11-09 DIAGNOSIS — Z79891 Long term (current) use of opiate analgesic: Secondary | ICD-10-CM | POA: Diagnosis not present

## 2021-11-09 DIAGNOSIS — Z9181 History of falling: Secondary | ICD-10-CM | POA: Diagnosis not present

## 2021-11-09 DIAGNOSIS — F419 Anxiety disorder, unspecified: Secondary | ICD-10-CM | POA: Diagnosis not present

## 2021-11-09 DIAGNOSIS — I471 Supraventricular tachycardia: Secondary | ICD-10-CM | POA: Diagnosis not present

## 2021-11-09 DIAGNOSIS — G8929 Other chronic pain: Secondary | ICD-10-CM | POA: Diagnosis not present

## 2021-11-09 DIAGNOSIS — M50821 Other cervical disc disorders at C4-C5 level: Secondary | ICD-10-CM | POA: Diagnosis not present

## 2021-11-09 DIAGNOSIS — Z79890 Hormone replacement therapy (postmenopausal): Secondary | ICD-10-CM | POA: Diagnosis not present

## 2021-11-09 DIAGNOSIS — M549 Dorsalgia, unspecified: Secondary | ICD-10-CM | POA: Diagnosis not present

## 2021-11-09 DIAGNOSIS — M1990 Unspecified osteoarthritis, unspecified site: Secondary | ICD-10-CM | POA: Diagnosis not present

## 2021-11-09 DIAGNOSIS — I447 Left bundle-branch block, unspecified: Secondary | ICD-10-CM | POA: Diagnosis not present

## 2021-11-09 DIAGNOSIS — K219 Gastro-esophageal reflux disease without esophagitis: Secondary | ICD-10-CM | POA: Diagnosis not present

## 2021-11-11 NOTE — Progress Notes (Unsigned)
Cardiology Office Note  Date:  11/14/2021   ID:  Theresa Buck, DOB 29-Oct-1955, MRN [id number]  PCP:  Einar Pheasant, MD   Chief Complaint  Patient presents with   1 month follow up     Patient fell on October 12, 2021; had total left hip surgery, is here for evaluation from the fall. Medications reviewed by the patient verbally. "Doing well."     HPI:  Theresa Buck is a 66 y.o. female with a hx of  paroxysmal tachycardia dating back 10 years,  Previously seen in 2014 for paroxysmal tachycardia, having severe episodes at that time, lost to follow-up With continued episodes and recent presentation to the hospital March 2022 EKG documented wide-complex tachycardia concerning for SVT With associated diaphoresis, near syncope, breaking on its own Echo with ejection fraction 45 to 50% in March 2022 Cardiac CTA April 2022 no significant coronary disease, calcium score of 12 Who presents for follow-up of her SVT, status post ablation, cardiomyopathy  Last seen in clinic by myself May 2023 Reports having a fall October 12, 2021, taken by EMS to the ER for left hip pain X-ray showing displaced left femoral neck fracture, underwent surgery, discharged home June 24 No complications noted EKG  in the hospital with concern for new left bundle branch block reported  She has been doing physical therapy for hip since discharge Tracking blood pressures at home, blood pressure typically low 100, rarely high 90s Denies orthostasis symptoms Weight loss of 5 pounds over the past year through recent events Reports she continues to take metoprolol succinate 50 daily Denies any breakthrough tachypalpitations  Seen in the emergency room May 16, 2021 for tachypalpitations  Echocardiogram 06/17/2021 ejection fraction 55 to 60% Ejection fraction increased from 45 to 50% in 2022  Seen by EP March 2023 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  PSH:    Past Surgical History:  Procedure Laterality Date   BACK SURGERY  09/26/2013   LUMBAR   COLONOSCOPY  08/03/2015   Dr Vira Agar   HEMORRHOID SURGERY N/A 03/13/2017   Procedure: HEMORRHOIDECTOMY;  Surgeon: Christene Lye, MD;  Location: [facility];  Service: General;  Laterality: N/A;   SVT ABLATION N/A 09/16/2020   Procedure: SVT ABLATION;  Surgeon: Vickie Epley, MD;  Location: [facility];  Service: Cardiovascular;  Laterality: N/A;   TOTAL HIP ARTHROPLASTY Left 10/13/2021   Procedure: TOTAL HIP ARTHROPLASTY ANTERIOR APPROACH;  Surgeon: Hessie Knows, MD;  Location: [facility];  Service: Orthopedics;  Laterality: Left;   UPPER GI ENDOSCOPY  08/03/2015   Dr Vira Agar    Current Outpatient Medications  Medication Sig Dispense Refill   Biotin 5000 MCG TABS Take 5,000 mcg by mouth daily.     Calcium Carb-Cholecalciferol (CALCIUM + D3 PO) Take 1 tablet daily by mouth.     carboxymethylcellulose (REFRESH PLUS) 0.5 % SOLN Place 1 drop into both eyes 3 (three) times daily as needed (dry eyes).     estradiol (ESTRACE) 0.1 MG/GM vaginal cream Place 1 Applicatorful vaginally daily. 42.5 g 1   estradiol (ESTRACE) 1 MG tablet TAKE 1/2 TABLET(0.5  MG) BY MOUTH DAILY 45 tablet 1   hydrocortisone (ANUSOL-HC) 25 MG suppository Place 1 suppository (25 mg total) rectally 2 (two) times daily. 20 suppository 0   montelukast (SINGULAIR) 10 MG tablet Take 10 mg at bedtime by mouth.   1   Multiple Vitamin (MULTIVITAMIN) tablet Take 1 tablet by mouth daily.     pantoprazole (PROTONIX) 40 MG tablet TAKE 1 TABLET BY MOUTH EVERY DAY  90 tablet 3   Probiotic Product (PROBIOTIC ADVANCED PO) Take 1 capsule daily by mouth.      progesterone (PROMETRIUM) 100 MG capsule Take 100 mg daily by mouth.  0   vitamin C (ASCORBIC ACID) 500 MG tablet Take 500 mg by mouth daily.     metoprolol succinate (TOPROL-XL) 25 MG 24 hr tablet Take 1 tablet (25 mg total) by mouth daily. Take with or immediately following a meal. 90 tablet 3   No current facility-administered medications for this visit.    Allergies:   Ceftin [cefuroxime axetil]   Social History:  The patient  reports that she has never smoked. She has never used smokeless tobacco. She reports that she does not drink alcohol and does not use drugs.   Family History:   family history includes ALS in her father; Brain cancer in her mother; Diabetes in her brother; Heart disease in her son; Throat cancer in her brother.   Review of Systems: Review of Systems  Constitutional: Negative.   HENT: Negative.    Respiratory: Negative.    Cardiovascular: Negative.   Gastrointestinal: Negative.   Musculoskeletal: Negative.   Neurological: Negative.   Psychiatric/Behavioral: Negative.    All other systems reviewed and are negative.   PHYSICAL EXAM: VS:  BP 100/68 (BP Location: Left Arm, Patient Position: Sitting, Cuff Size: Normal)   Pulse 68   Ht '5\' 6"'$  (1.676 m)   Wt 139 lb 6 oz (63.2 kg)   LMP 07/15/2012   SpO2 98%   BMI 22.50 kg/m  , BMI Body mass index is 22.5 kg/m. Constitutional:  oriented to person, place, and time. No distress.  HENT:  Head: Grossly normal Eyes:  no discharge. No scleral icterus.  Neck: No JVD, no carotid bruits  Cardiovascular: Regular rate and rhythm, no murmurs appreciated Pulmonary/Chest: Clear to auscultation bilaterally, no wheezes or rails Abdominal: Soft.  no distension.  no tenderness.  Musculoskeletal: Normal range of motion Neurological:  normal muscle tone. Coordination normal. No atrophy Skin: Skin warm and dry Psychiatric: normal  affect, pleasant  Recent Labs: 07/14/2021: Magnesium 2.0; TSH 1.98 10/16/2021: ALT 19 10/28/2021: BUN 18; Creatinine, Ser 0.71; Hemoglobin 11.2; Platelets 401.0; Potassium 4.0; Sodium 138    Lipid Panel Lab Results  Component Value Date   CHOL 192 07/14/2021   HDL 53.90 07/14/2021   LDLCALC 123 (H) 07/14/2021   TRIG 77.0 07/14/2021      Wt Readings from Last 3 Encounters:  11/14/21 139 lb 6 oz (63.2 kg)  10/28/21 141 lb 3.2 oz (64 kg)  10/16/21 140 lb (63.5 kg)      ASSESSMENT AND PLAN:  Problem List Items Addressed This Visit   None Visit Diagnoses     SVT (supraventricular tachycardia) (HCC)    -  Primary   Relevant Medications   metoprolol succinate (TOPROL-XL) 25 MG 24 hr tablet   AVNRT (AV nodal re-entry tachycardia) (HCC)       Relevant Medications   metoprolol succinate (TOPROL-XL) 25 MG 24 hr tablet   Palpitations  Nonischemic cardiomyopathy (HCC)       Relevant Medications   metoprolol succinate (TOPROL-XL) 25 MG 24 hr tablet   Depressed left ventricular ejection fraction       Dilated cardiomyopathy (HCC)       Relevant Medications   metoprolol succinate (TOPROL-XL) 25 MG 24 hr tablet      Wide-complex tachycardia/SVT Status post ablation Minimal arrhythmia Low blood pressure, concern for recent fall in the bathroom with hip fracture Recommend she decrease metoprolol succinate back to 25 mg daily Could take metoprolol to tartrate for breakthrough tachypalpitations Followed by Dr. Quentin Ore  Left bundle branch block New finding on EKG following hip fracture No ischemic work-up needed, cardiac CTA April 2022 with no significant disease Discussed finding  Cardiomyopathy/depressed ejection fraction Ejection fraction has improved on repeat study No significant coronary disease  PVCs Appears to be RV outflow tract based Asymptomatic, on metoprolol succinate Recommend she decrease metoprolol succinate down to 25 daily given hypotension   Total  encounter time more than 30 minutes  Greater than 50% was spent in counseling and coordination of care with the patient   Signed, Esmond Plants, M.D., Ph.D. [place], [place]

## 2021-11-14 ENCOUNTER — Encounter: Payer: Self-pay | Admitting: Cardiovascular Disease

## 2021-11-14 ENCOUNTER — Ambulatory Visit: Payer: BC Managed Care – PPO | Admitting: Cardiovascular Disease

## 2021-11-14 DIAGNOSIS — Z9181 History of falling: Secondary | ICD-10-CM | POA: Diagnosis not present

## 2021-11-14 DIAGNOSIS — M549 Dorsalgia, unspecified: Secondary | ICD-10-CM | POA: Diagnosis not present

## 2021-11-14 DIAGNOSIS — M419 Scoliosis, unspecified: Secondary | ICD-10-CM | POA: Diagnosis not present

## 2021-11-14 DIAGNOSIS — F32A Depression, unspecified: Secondary | ICD-10-CM | POA: Diagnosis not present

## 2021-11-14 DIAGNOSIS — R0989 Other specified symptoms and signs involving the circulatory and respiratory systems: Secondary | ICD-10-CM

## 2021-11-14 DIAGNOSIS — M50821 Other cervical disc disorders at C4-C5 level: Secondary | ICD-10-CM | POA: Diagnosis not present

## 2021-11-14 DIAGNOSIS — I428 Other cardiomyopathies: Secondary | ICD-10-CM

## 2021-11-14 DIAGNOSIS — I447 Left bundle-branch block, unspecified: Secondary | ICD-10-CM | POA: Diagnosis not present

## 2021-11-14 DIAGNOSIS — Z79891 Long term (current) use of opiate analgesic: Secondary | ICD-10-CM | POA: Diagnosis not present

## 2021-11-14 DIAGNOSIS — K219 Gastro-esophageal reflux disease without esophagitis: Secondary | ICD-10-CM | POA: Diagnosis not present

## 2021-11-14 DIAGNOSIS — F419 Anxiety disorder, unspecified: Secondary | ICD-10-CM | POA: Diagnosis not present

## 2021-11-14 DIAGNOSIS — M1990 Unspecified osteoarthritis, unspecified site: Secondary | ICD-10-CM | POA: Diagnosis not present

## 2021-11-14 DIAGNOSIS — I420 Dilated cardiomyopathy: Secondary | ICD-10-CM

## 2021-11-14 DIAGNOSIS — I471 Supraventricular tachycardia: Secondary | ICD-10-CM | POA: Diagnosis not present

## 2021-11-14 DIAGNOSIS — R002 Palpitations: Secondary | ICD-10-CM | POA: Diagnosis not present

## 2021-11-14 DIAGNOSIS — G8929 Other chronic pain: Secondary | ICD-10-CM | POA: Diagnosis not present

## 2021-11-14 DIAGNOSIS — S72002D Fracture of unspecified part of neck of left femur, subsequent encounter for closed fracture with routine healing: Secondary | ICD-10-CM | POA: Diagnosis not present

## 2021-11-14 DIAGNOSIS — G40909 Epilepsy, unspecified, not intractable, without status epilepticus: Secondary | ICD-10-CM | POA: Diagnosis not present

## 2021-11-14 DIAGNOSIS — Z79890 Hormone replacement therapy (postmenopausal): Secondary | ICD-10-CM | POA: Diagnosis not present

## 2021-11-14 MED ORDER — METOPROLOL SUCCINATE ER 25 MG PO TB24: 25.0000 mg | ORAL_TABLET | Freq: Every day | ORAL | 3 refills | Status: DC

## 2021-11-14 NOTE — Patient Instructions (Addendum)
Medication Instructions:  Please decrease the metoprolol succinate down to 25 mg daily  If you need a refill on your cardiac medications before your next appointment, please call your pharmacy.   Lab work: No new labs needed  Testing/Procedures: No new testing needed  Follow-Up: At [facility], you and your health needs are our priority.  As part of our continuing mission to provide you with exceptional heart care, we have created designated Provider Care Teams.  These Care Teams include your primary Cardiologist (physician) and Advanced Practice Providers (APPs -  Physician Assistants and Nurse Practitioners) who all work together to provide you with the care you need, when you need it.  You will need a follow up appointment in 12 months  Providers on your designated Care Team:   Murray Hodgkins, NP Christell Faith, PA-C Cadence Kathlen Mody, [place]  COVID-19 Vaccine Information can be found at: [email or web address].uk For questions related to vaccine distribution or appointments, please email [email or web address] or call [phone].

## 2021-11-17 DIAGNOSIS — Z79891 Long term (current) use of opiate analgesic: Secondary | ICD-10-CM | POA: Diagnosis not present

## 2021-11-17 DIAGNOSIS — M549 Dorsalgia, unspecified: Secondary | ICD-10-CM | POA: Diagnosis not present

## 2021-11-17 DIAGNOSIS — S72002D Fracture of unspecified part of neck of left femur, subsequent encounter for closed fracture with routine healing: Secondary | ICD-10-CM | POA: Diagnosis not present

## 2021-11-17 DIAGNOSIS — M50821 Other cervical disc disorders at C4-C5 level: Secondary | ICD-10-CM | POA: Diagnosis not present

## 2021-11-17 DIAGNOSIS — I447 Left bundle-branch block, unspecified: Secondary | ICD-10-CM | POA: Diagnosis not present

## 2021-11-17 DIAGNOSIS — G40909 Epilepsy, unspecified, not intractable, without status epilepticus: Secondary | ICD-10-CM | POA: Diagnosis not present

## 2021-11-17 DIAGNOSIS — F419 Anxiety disorder, unspecified: Secondary | ICD-10-CM | POA: Diagnosis not present

## 2021-11-17 DIAGNOSIS — G8929 Other chronic pain: Secondary | ICD-10-CM | POA: Diagnosis not present

## 2021-11-17 DIAGNOSIS — I471 Supraventricular tachycardia: Secondary | ICD-10-CM | POA: Diagnosis not present

## 2021-11-17 DIAGNOSIS — F32A Depression, unspecified: Secondary | ICD-10-CM | POA: Diagnosis not present

## 2021-11-17 DIAGNOSIS — K219 Gastro-esophageal reflux disease without esophagitis: Secondary | ICD-10-CM | POA: Diagnosis not present

## 2021-11-17 DIAGNOSIS — M419 Scoliosis, unspecified: Secondary | ICD-10-CM | POA: Diagnosis not present

## 2021-11-17 DIAGNOSIS — Z79890 Hormone replacement therapy (postmenopausal): Secondary | ICD-10-CM | POA: Diagnosis not present

## 2021-11-17 DIAGNOSIS — Z9181 History of falling: Secondary | ICD-10-CM | POA: Diagnosis not present

## 2021-11-17 DIAGNOSIS — M1990 Unspecified osteoarthritis, unspecified site: Secondary | ICD-10-CM | POA: Diagnosis not present

## 2021-11-17 NOTE — Addendum Note (Signed)
Addended by: Nestor Ramp on: 11/17/2021 03:37 PM   Modules accepted: Orders

## 2021-11-21 ENCOUNTER — Ambulatory Visit: Payer: BC Managed Care – PPO | Admitting: Dermatology

## 2021-11-21 DIAGNOSIS — D1800 Hemangioma unspecified site: Secondary | ICD-10-CM | POA: Diagnosis not present

## 2021-11-21 DIAGNOSIS — L820 Inflamed seborrheic keratosis: Secondary | ICD-10-CM | POA: Diagnosis not present

## 2021-11-21 DIAGNOSIS — L720 Epidermal cyst: Secondary | ICD-10-CM

## 2021-11-21 DIAGNOSIS — M50821 Other cervical disc disorders at C4-C5 level: Secondary | ICD-10-CM | POA: Diagnosis not present

## 2021-11-21 DIAGNOSIS — F32A Depression, unspecified: Secondary | ICD-10-CM | POA: Diagnosis not present

## 2021-11-21 DIAGNOSIS — K219 Gastro-esophageal reflux disease without esophagitis: Secondary | ICD-10-CM | POA: Diagnosis not present

## 2021-11-21 DIAGNOSIS — Z79890 Hormone replacement therapy (postmenopausal): Secondary | ICD-10-CM | POA: Diagnosis not present

## 2021-11-21 DIAGNOSIS — I447 Left bundle-branch block, unspecified: Secondary | ICD-10-CM | POA: Diagnosis not present

## 2021-11-21 DIAGNOSIS — M549 Dorsalgia, unspecified: Secondary | ICD-10-CM | POA: Diagnosis not present

## 2021-11-21 DIAGNOSIS — L2389 Allergic contact dermatitis due to other agents: Secondary | ICD-10-CM

## 2021-11-21 DIAGNOSIS — L821 Other seborrheic keratosis: Secondary | ICD-10-CM | POA: Diagnosis not present

## 2021-11-21 DIAGNOSIS — G8929 Other chronic pain: Secondary | ICD-10-CM | POA: Diagnosis not present

## 2021-11-21 DIAGNOSIS — M1990 Unspecified osteoarthritis, unspecified site: Secondary | ICD-10-CM | POA: Diagnosis not present

## 2021-11-21 DIAGNOSIS — F419 Anxiety disorder, unspecified: Secondary | ICD-10-CM | POA: Diagnosis not present

## 2021-11-21 DIAGNOSIS — M419 Scoliosis, unspecified: Secondary | ICD-10-CM | POA: Diagnosis not present

## 2021-11-21 DIAGNOSIS — Z79891 Long term (current) use of opiate analgesic: Secondary | ICD-10-CM | POA: Diagnosis not present

## 2021-11-21 DIAGNOSIS — I471 Supraventricular tachycardia: Secondary | ICD-10-CM | POA: Diagnosis not present

## 2021-11-21 DIAGNOSIS — Z9181 History of falling: Secondary | ICD-10-CM | POA: Diagnosis not present

## 2021-11-21 DIAGNOSIS — G40909 Epilepsy, unspecified, not intractable, without status epilepticus: Secondary | ICD-10-CM | POA: Diagnosis not present

## 2021-11-21 DIAGNOSIS — S72002D Fracture of unspecified part of neck of left femur, subsequent encounter for closed fracture with routine healing: Secondary | ICD-10-CM | POA: Diagnosis not present

## 2021-11-21 MED ORDER — TRETINOIN 0.05 % EX CREA: TOPICAL_CREAM | CUTANEOUS | 1 refills | Status: DC

## 2021-11-21 MED ORDER — CLOBETASOL PROPIONATE 0.05 % EX CREA: TOPICAL_CREAM | CUTANEOUS | 0 refills | Status: AC

## 2021-11-21 NOTE — Patient Instructions (Addendum)
Cryotherapy Aftercare  Wash gently with soap and water everyday.   Apply Vaseline and Band-Aid daily until healed.    Start Tretinoin 0.05% cream - Apply a small amount to bumps on face every night as tolerated.  Topical retinoid medications like tretinoin/Retin-A, adapalene/Differin, tazarotene/Fabior, and Epiduo/Epiduo Forte can cause dryness and irritation when first started. Only apply a pea-sized amount to the entire affected area. Avoid applying it around the eyes, edges of mouth and creases at the nose. If you experience irritation, use a good moisturizer first and/or apply the medicine less often. If you are doing well with the medicine, you can increase how often you use it until you are applying every night. Be careful with sun protection while using this medication as it can make you sensitive to the sun. This medicine should not be used by pregnant women.   Start Clobetasol Cream - Apply to bumps on right side twice a day until improved. Avoid face, groin, underarms.   Topical steroids (such as triamcinolone, fluocinolone, fluocinonide, mometasone, clobetasol, halobetasol, betamethasone, hydrocortisone) can cause thinning and lightening of the skin if they are used for too long in the same area. Your physician has selected the right strength medicine for your problem and area affected on the body. Please use your medication only as directed by your physician to prevent side effects.     Due to recent changes in healthcare laws, you may see results of your pathology and/or laboratory studies on MyChart before the doctors have had a chance to review them. We understand that in some cases there may be results that are confusing or concerning to you. Please understand that not all results are received at the same time and often the doctors may need to interpret multiple results in order to provide you with the best plan of care or course of treatment. Therefore, we ask that you please give [place]  2 business days to thoroughly review all your results before contacting the office for clarification. Should we see a critical lab result, you will be contacted sooner.   If You Need Anything After Your Visit  If you have any questions or concerns for your doctor, please call our main line at [phone] and press option 4 to reach your doctor's medical assistant. If no one answers, please leave a voicemail as directed and we will return your call as soon as possible. Messages left after 4 pm will be answered the following business day.   You may also send [place] a message via [place]. We typically respond to MyChart messages within 1-2 business days.  For prescription refills, please ask your pharmacy to contact our office. Our fax number is [phone].  If you have an urgent issue when the clinic is closed that cannot wait until the next business day, you can page your doctor at the number below.    Please note that while we do our best to be available for urgent issues outside of office hours, we are not available 24/7.   If you have an urgent issue and are unable to reach [place], you may choose to seek medical care at your doctor's office, retail clinic, urgent care center, or emergency room.  If you have a medical emergency, please immediately call 911 or go to the emergency department.  Pager Numbers  - Dr. Nehemiah Massed: [phone]  - Dr. Laurence Ferrari: [phone]  - Dr. Nicole Kindred: [phone]  In the event of inclement weather, please call our main line at [phone] for an  update on the status of any delays or closures.  Dermatology Medication Tips: Please keep the boxes that topical medications come in in order to help keep track of the instructions about where and how to use these. Pharmacies typically print the medication instructions only on the boxes and not directly on the medication tubes.   If your medication is too expensive, please contact our office at [phone] option 4 or  send [place] a message through [place].   We are unable to tell what your co-pay for medications will be in advance as this is different depending on your insurance coverage. However, we may be able to find a substitute medication at lower cost or fill out paperwork to get insurance to cover a needed medication.   If a prior authorization is required to get your medication covered by your insurance company, please allow [place] 1-2 business days to complete this process.  Drug prices often vary depending on where the prescription is filled and some pharmacies may offer cheaper prices.  The website www.goodrx.com contains coupons for medications through different pharmacies. The prices here do not account for what the cost may be with help from insurance (it may be cheaper with your insurance), but the website can give you the price if you did not use any insurance.  - You can print the associated coupon and take it with your prescription to the pharmacy.  - You may also stop by our office during regular business hours and pick up a GoodRx coupon card.  - If you need your prescription sent electronically to a different pharmacy, notify our office through [facility] or by phone at [phone] option 4.     Si Usted Necesita Algo Despus de Su Visita  Tambin puede enviarnos un mensaje a travs de Pharmacist, community. Por lo general respondemos a los mensajes de MyChart en el transcurso de 1 a 2 das hbiles.  Para renovar recetas, por favor pida a su farmacia que se ponga en contacto con nuestra oficina. Harland Dingwall de fax es Ishmael Berkovich [phone].  Si tiene un asunto urgente cuando la clnica est cerrada y que no puede esperar hasta el siguiente da hbil, puede llamar/localizar a su doctor(a) al nmero que aparece a continuacin.   Por favor, tenga en cuenta que aunque hacemos todo lo posible para estar disponibles para asuntos urgentes fuera del horario de [place], no estamos disponibles las 24 horas del  da, los 7 das de la [place].   Si tiene un problema urgente y no puede comunicarse con nosotros, puede optar por buscar atencin mdica  en el consultorio de su doctor(a), en una clnica privada, en un centro de atencin urgente o en una sala de emergencias.  Si tiene Engineering geologist, por favor llame inmediatamente al 911 o vaya a la sala de emergencias.  Nmeros de bper  - Dr. Nehemiah Massed: [phone]  - Dra. Moye: [phone]  - Dra. Nicole Kindred: [phone]  En caso de inclemencias del [place], por favor llame a Johnsie Kindred principal al [phone] para una actualizacin sobre el [place] de cualquier retraso o cierre.  Consejos para la medicacin en dermatologa: Por favor, guarde las cajas en las que vienen los medicamentos de uso tpico para ayudarle a seguir las instrucciones sobre dnde y cmo usarlos. Las farmacias generalmente imprimen las instrucciones del medicamento slo en las cajas y no directamente en los tubos del [place].   Si su medicamento es [employer], por favor, pngase en contacto con nuestra oficina llamando  al [phone] y presione la opcin 4 o envenos un mensaje a travs de Pharmacist, community.   No podemos decirle cul ser su copago por los medicamentos por adelantado ya que esto es diferente dependiendo de la cobertura de su seguro. Sin embargo, es posible que podamos encontrar un medicamento sustituto a Electrical engineer un formulario para que el seguro cubra el medicamento que se considera necesario.   Si se requiere una autorizacin previa para que su compaa de seguros [place] su medicamento, por favor permtanos de 1 a 2 das hbiles para completar este proceso.  Los precios de los medicamentos varan con frecuencia dependiendo del Environmental consultant de dnde se surte la receta y alguna farmacias pueden ofrecer precios ms baratos.  El sitio web www.goodrx.com tiene cupones para medicamentos de Airline pilot. Los precios aqu no tienen en cuenta lo que podra  costar con la ayuda del seguro (puede ser ms barato con su seguro), pero el sitio web puede darle el precio si no utiliz Research scientist (physical sciences).  - Puede imprimir el cupn correspondiente y llevarlo con su receta a la farmacia.  - Tambin puede pasar por nuestra oficina durante el horario de atencin regular y Charity fundraiser una tarjeta de cupones de GoodRx.  - Si necesita que su receta se enve electrnicamente a una farmacia diferente, informe a nuestra oficina a travs de MyChart de [place] o por telfono llamando al [phone] y presione la opcin 4.

## 2021-11-21 NOTE — Progress Notes (Signed)
Follow-Up Visit   Subjective  Theresa Buck is a 66 y.o. female who presents for the following: Check several spots.  Patient here to have a few spots on her face checked. She has bumps on the glabella that will not go away. Also a spot on the left lateral canthus. She has a growth on the left side that gets hit and rubbed. She also thinks she was bit by something on her right flank. Was itchy at first, slight burn. She hasn't been working in the yard as she has had recent hip replacement. She does sit on porch at times.   The following portions of the chart were reviewed this encounter and updated as appropriate:       Review of Systems:  No other skin or systemic complaints except as noted in HPI or Assessment and Plan.  Objective  Well appearing patient in no apparent distress; mood and affect are within normal limits.  A focused examination was performed including face, trunk. Relevant physical exam findings are noted in the Assessment and Plan.  Glabella Tiny yellow/pink scaly firm papules   Left Flank x 2 (2) Erythematous stuck-on, waxy papule  Right Flank Linear pink papules with early vesiculation.    Assessment & Plan  Sebaceous Hyperplasia - Small yellow papules with a central dell - Benign - Observe  Milia Glabella  Vrs sebaceous hyperplasia, irritated  Avoid picking, discussed extraction to milia if doesn't clear  Start tretinoin 0.05% ceram Apply to AA qhs as tolerated dsp 45g 1Rf  Topical retinoid medications like tretinoin/Retin-A, adapalene/Differin, tazarotene/Fabior, and Epiduo/Epiduo Forte can cause dryness and irritation when first started. Only apply a pea-sized amount to the entire affected area. Avoid applying it around the eyes, edges of mouth and creases at the nose. If you experience irritation, use a good moisturizer first and/or apply the medicine less often. If you are doing well with the medicine, you can increase how often you use it  until you are applying every night. Be careful with sun protection while using this medication as it can make you sensitive to the sun. This medicine should not be used by pregnant women.    tretinoin (RETIN-A) 0.05 % cream - Glabella Apply a small amount to affected areas face every night as tolerated.  Inflamed seborrheic keratosis (2) Left Flank x 2  Symptomatic, irritating, patient would like treated.  Destruction of lesion - Left Flank x 2  Destruction method: cryotherapy   Informed consent: discussed and consent obtained   Lesion destroyed using liquid nitrogen: Yes   Region frozen until ice ball extended beyond lesion: Yes   Outcome: patient tolerated procedure well with no complications   Post-procedure details: wound care instructions given   Additional details:  Prior to procedure, discussed risks of blister formation, small wound, skin dyspigmentation, or rare scar following cryotherapy. Recommend Vaseline ointment to treated areas while healing.   Allergic contact dermatitis due to other agents Right Flank  vs Bite Reaction vs early Herpes Zoster  Patient states more itchy than painful Patient to observe for new lesions in a linear pattern, following a nerve. Patient will contact us if rash worsens  Start Clobetasol Cream Apply to AA BID until improved dsp 30g 0Rf. Avoid face, groin, axilla.  Topical steroids (such as triamcinolone, fluocinolone, fluocinonide, mometasone, clobetasol, halobetasol, betamethasone, hydrocortisone) can cause thinning and lightening of the skin if they are used for too long in the same area. Your physician has selected the right strength  medicine for your problem and area affected on the body. Please use your medication only as directed by your physician to prevent side effects.    clobetasol cream (TEMOVATE) 0.05 % - Right Flank Apply to bumps on right side twice daily until improved. Avoid face, groin, axilla.  Seborrheic Keratoses -  Stuck-on, waxy, tan-brown papules and/or plaques of the left lateral eye - Benign-appearing - Discussed benign etiology and prognosis. - Observe - Call for any changes - Discussed cryotherapy if becomes symptomatic  Hemangiomas - Red papules - Discussed benign nature - Observe - Call for any changes  Return if symptoms worsen or fail to improve.  IJamesetta Orleans, CMA, am acting as scribe for Brendolyn Patty, MD .  Documentation: I have reviewed the above documentation for accuracy and completeness, and I agree with the above.  Brendolyn Patty MD

## 2021-11-23 DIAGNOSIS — F419 Anxiety disorder, unspecified: Secondary | ICD-10-CM | POA: Diagnosis not present

## 2021-11-23 DIAGNOSIS — I471 Supraventricular tachycardia: Secondary | ICD-10-CM | POA: Diagnosis not present

## 2021-11-23 DIAGNOSIS — K219 Gastro-esophageal reflux disease without esophagitis: Secondary | ICD-10-CM | POA: Diagnosis not present

## 2021-11-23 DIAGNOSIS — Z9181 History of falling: Secondary | ICD-10-CM | POA: Diagnosis not present

## 2021-11-23 DIAGNOSIS — Z79890 Hormone replacement therapy (postmenopausal): Secondary | ICD-10-CM | POA: Diagnosis not present

## 2021-11-23 DIAGNOSIS — M1990 Unspecified osteoarthritis, unspecified site: Secondary | ICD-10-CM | POA: Diagnosis not present

## 2021-11-23 DIAGNOSIS — M549 Dorsalgia, unspecified: Secondary | ICD-10-CM | POA: Diagnosis not present

## 2021-11-23 DIAGNOSIS — F32A Depression, unspecified: Secondary | ICD-10-CM | POA: Diagnosis not present

## 2021-11-23 DIAGNOSIS — I447 Left bundle-branch block, unspecified: Secondary | ICD-10-CM | POA: Diagnosis not present

## 2021-11-23 DIAGNOSIS — G40909 Epilepsy, unspecified, not intractable, without status epilepticus: Secondary | ICD-10-CM | POA: Diagnosis not present

## 2021-11-23 DIAGNOSIS — G8929 Other chronic pain: Secondary | ICD-10-CM | POA: Diagnosis not present

## 2021-11-23 DIAGNOSIS — M419 Scoliosis, unspecified: Secondary | ICD-10-CM | POA: Diagnosis not present

## 2021-11-23 DIAGNOSIS — S72002D Fracture of unspecified part of neck of left femur, subsequent encounter for closed fracture with routine healing: Secondary | ICD-10-CM | POA: Diagnosis not present

## 2021-11-23 DIAGNOSIS — M50821 Other cervical disc disorders at C4-C5 level: Secondary | ICD-10-CM | POA: Diagnosis not present

## 2021-11-23 DIAGNOSIS — Z79891 Long term (current) use of opiate analgesic: Secondary | ICD-10-CM | POA: Diagnosis not present

## 2021-11-24 ENCOUNTER — Telehealth: Payer: Self-pay | Admitting: Cardiovascular Disease

## 2021-11-24 DIAGNOSIS — R072 Precordial pain: Secondary | ICD-10-CM

## 2021-11-24 NOTE — Telephone Encounter (Signed)
    Pt c/o of Chest Pain: STAT if CP now or developed within 24 hours  1. Are you having CP right now? No   2. Are you experiencing any other symptoms (ex. SOB, nausea, vomiting, sweating)? no  3. How long have you been experiencing CP? A couple days   4. Is your CP continuous or coming and going? Coming and going   5. Have you taken Nitroglycerin? No   Claudius Sis called on behalf on pt states that pt told him her heart has been hurting and wanted to make Dr. Rockey Situ aware to see what pt needs to do. He states the pain is on and off and pt denies any other symptoms.  ?

## 2021-11-24 NOTE — Telephone Encounter (Signed)
Spoke with patients boyfriend Zenia Resides. He stated that the patient, who is not with him at the moment, has been complaining of her "heart hurting". He stated that it has been racing, and that he could "see it beating through her chest'. He did not have any VS available and requested that I send the patient a MyChart requesting them.   He wanted her to be seen by Dr. Rockey Situ immediately. I informed him that we do not have any appointments available for him for the next few weeks. I offered an appointment with  Cadence Furth next week on 8/9, however he stated, "Hopefully she will still be alive by then".   Copied from Dr. 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 Rockey Situ and Dr. Quentin Ore of what is going on with her now, as he wants her to be seen ASAP. Gave ED precautions and confirmed that I would send the message to both providers.  MyChart sent to patient requesting vitals.

## 2021-11-25 ENCOUNTER — Ambulatory Visit: Payer: BC Managed Care – PPO | Admitting: Internal Medicine

## 2021-11-25 ENCOUNTER — Other Ambulatory Visit (INDEPENDENT_AMBULATORY_CARE_PROVIDER_SITE_OTHER): Payer: BC Managed Care – PPO

## 2021-11-25 DIAGNOSIS — M25562 Pain in left knee: Secondary | ICD-10-CM | POA: Diagnosis not present

## 2021-11-25 DIAGNOSIS — M1612 Unilateral primary osteoarthritis, left hip: Secondary | ICD-10-CM | POA: Diagnosis not present

## 2021-11-25 DIAGNOSIS — D649 Anemia, unspecified: Secondary | ICD-10-CM | POA: Diagnosis not present

## 2021-11-25 LAB — CBC WITH DIFFERENTIAL/PLATELET
Basophils Absolute: 0.1 10*3/uL (ref 0.0–0.1)
Basophils Relative: 0.8 % (ref 0.0–3.0)
Eosinophils Absolute: 0.1 10*3/uL (ref 0.0–0.7)
Eosinophils Relative: 1.8 % (ref 0.0–5.0)
HCT: 39.4 % (ref 36.0–46.0)
Hemoglobin: 13.1 g/dL (ref 12.0–15.0)
Lymphocytes Relative: 18.6 % (ref 12.0–46.0)
Lymphs Abs: 1.6 10*3/uL (ref 0.7–4.0)
MCHC: 33.1 g/dL (ref 30.0–36.0)
MCV: 96.8 fl (ref 78.0–100.0)
Monocytes Absolute: 0.6 10*3/uL (ref 0.1–1.0)
Monocytes Relative: 7.2 % (ref 3.0–12.0)
Neutro Abs: 6 10*3/uL (ref 1.4–7.7)
Neutrophils Relative %: 71.6 % (ref 43.0–77.0)
Platelets: 239 10*3/uL (ref 150.0–400.0)
RBC: 4.07 Mil/uL (ref 3.87–5.11)
RDW: 13.2 % (ref 11.5–15.5)
WBC: 8.4 10*3/uL (ref 4.0–10.5)

## 2021-11-25 LAB — IBC + FERRITIN
Ferritin: 63.7 ng/mL (ref 10.0–291.0)
Iron: 67 ug/dL (ref 42–145)
Saturation Ratios: 19.2 % — ABNORMAL LOW (ref 20.0–50.0)
TIBC: 348.6 ug/dL (ref 250.0–450.0)
Transferrin: 249 mg/dL (ref 212.0–360.0)

## 2021-11-25 LAB — VITAMIN B12: Vitamin B-12: 476 pg/mL (ref 211–911)

## 2021-12-01 ENCOUNTER — Other Ambulatory Visit: Payer: Self-pay | Admitting: Internal Medicine

## 2021-12-01 MED ORDER — METOPROLOL TARTRATE 100 MG PO TABS: 100.0000 mg | ORAL_TABLET | Freq: Once | ORAL | 0 refills | Status: DC

## 2021-12-02 ENCOUNTER — Other Ambulatory Visit
Admission: RE | Admit: 2021-12-02 | Discharge: 2021-12-02 | Disposition: A | Discharge status: Home / Self Care | Payer: BC Managed Care – PPO | Attending: Cardiovascular Disease | Admitting: Cardiovascular Disease

## 2021-12-02 DIAGNOSIS — R072 Precordial pain: Secondary | ICD-10-CM

## 2021-12-02 LAB — BASIC METABOLIC PANEL
Anion gap: 7 (ref 5–15)
BUN: 24 mg/dL — ABNORMAL HIGH (ref 8–23)
CO2: 27 mmol/L (ref 22–32)
Calcium: 9.3 mg/dL (ref 8.9–10.3)
Chloride: 105 mmol/L (ref 98–111)
Creatinine, Ser: 0.69 mg/dL (ref 0.44–1.00)
GFR, Estimated: >60 mL/min (ref >60)
Glucose, Bld: 93 mg/dL (ref 70–99)
Potassium: 4.1 mmol/L (ref 3.5–5.1)
Sodium: 139 mmol/L (ref 135–145)

## 2021-12-07 ENCOUNTER — Telehealth (HOSPITAL_COMMUNITY): Payer: Self-pay | Admitting: *Deleted

## 2021-12-07 ENCOUNTER — Ambulatory Visit: Payer: BC Managed Care – PPO | Admitting: Dermatology

## 2021-12-07 NOTE — Telephone Encounter (Signed)
Reaching out to patient to offer assistance regarding upcoming cardiac imaging study; pt verbalizes understanding of appt date/time, parking situation and where to check in, pre-test NPO status and medications ordered, and verified current allergies; name and call back number provided for further questions should they arise ? ?Chelsea Nusz RN Navigator Cardiac Imaging ?[facility] ?[phone] office ?[phone] cell ? ?Patient to take 100mg metoprolol tartrate two hours prior to her cardiac CT scan.  ?

## 2021-12-08 ENCOUNTER — Ambulatory Visit
Admission: RE | Admit: 2021-12-08 | Discharge: 2021-12-08 | Disposition: A | Discharge status: Home / Self Care | Payer: BC Managed Care – PPO | Source: Ambulatory Visit | Attending: Cardiovascular Disease | Admitting: Cardiovascular Disease

## 2021-12-08 DIAGNOSIS — R072 Precordial pain: Secondary | ICD-10-CM | POA: Insufficient documentation

## 2021-12-08 MED ORDER — NITROGLYCERIN 0.4 MG SL SUBL
0.8000 mg | SUBLINGUAL_TABLET | Freq: Once | SUBLINGUAL | Status: AC
  Administered 2021-12-08: 0.8 mg via SUBLINGUAL

## 2021-12-08 MED ORDER — IOHEXOL 350 MG/ML SOLN
75.0000 mL | Freq: Once | INTRAVENOUS | Status: AC | PRN
  Administered 2021-12-08: 75 mL via INTRAVENOUS

## 2021-12-08 NOTE — Progress Notes (Signed)
Patient tolerated CT well. Drank water after. Vital signs stable encourage to drink water throughout day.Reasons explained and verbalized understanding. Ambulated steady gait.  

## 2021-12-09 ENCOUNTER — Other Ambulatory Visit: Payer: Self-pay | Admitting: Orthopedic Surgery

## 2021-12-09 DIAGNOSIS — M25562 Pain in left knee: Secondary | ICD-10-CM

## 2021-12-19 ENCOUNTER — Other Ambulatory Visit: Payer: BC Managed Care – PPO

## 2021-12-22 ENCOUNTER — Other Ambulatory Visit: Payer: BC Managed Care – PPO

## 2021-12-23 ENCOUNTER — Ambulatory Visit
Admission: RE | Admit: 2021-12-23 | Discharge: 2021-12-23 | Disposition: A | Discharge status: Home / Self Care | Payer: BC Managed Care – PPO | Source: Ambulatory Visit | Attending: Orthopedic Surgery | Admitting: Orthopedic Surgery

## 2021-12-23 DIAGNOSIS — M25562 Pain in left knee: Secondary | ICD-10-CM | POA: Diagnosis not present

## 2021-12-30 DIAGNOSIS — M41125 Adolescent idiopathic scoliosis, thoracolumbar region: Secondary | ICD-10-CM | POA: Diagnosis not present

## 2021-12-30 DIAGNOSIS — M5417 Radiculopathy, lumbosacral region: Secondary | ICD-10-CM | POA: Diagnosis not present

## 2021-12-30 DIAGNOSIS — M9903 Segmental and somatic dysfunction of lumbar region: Secondary | ICD-10-CM | POA: Diagnosis not present

## 2021-12-30 DIAGNOSIS — M9905 Segmental and somatic dysfunction of pelvic region: Secondary | ICD-10-CM | POA: Diagnosis not present

## 2022-01-05 ENCOUNTER — Encounter: Payer: Self-pay | Admitting: Obstetrics

## 2022-01-05 ENCOUNTER — Telehealth: Payer: Self-pay | Admitting: Internal Medicine

## 2022-01-05 NOTE — Telephone Encounter (Signed)
Need to confirm if she has been taking.  When last took?

## 2022-01-05 NOTE — Telephone Encounter (Signed)
Pt need refill on trazodone sent to [facility]

## 2022-01-06 DIAGNOSIS — M9903 Segmental and somatic dysfunction of lumbar region: Secondary | ICD-10-CM | POA: Diagnosis not present

## 2022-01-06 DIAGNOSIS — M9905 Segmental and somatic dysfunction of pelvic region: Secondary | ICD-10-CM | POA: Diagnosis not present

## 2022-01-06 DIAGNOSIS — M5417 Radiculopathy, lumbosacral region: Secondary | ICD-10-CM | POA: Diagnosis not present

## 2022-01-06 DIAGNOSIS — M41125 Adolescent idiopathic scoliosis, thoracolumbar region: Secondary | ICD-10-CM | POA: Diagnosis not present

## 2022-01-06 MED ORDER — TRAZODONE HCL 50 MG PO TABS: 25.0000 mg | ORAL_TABLET | Freq: Every evening | ORAL | 1 refills | Status: DC | PRN

## 2022-01-06 NOTE — Telephone Encounter (Signed)
I called patient and he VM is full.  [name],cma

## 2022-01-06 NOTE — Telephone Encounter (Signed)
I called the patient to confirm if she had requested the trazadone and she stated she did request a refill, she stated she does not take it very often and the last time she took a tablet was 1 night ago.  Theresa Buck,cma

## 2022-01-06 NOTE — Telephone Encounter (Signed)
Rx sent in for trazodone #30 with 1 refill.  Pt notified.

## 2022-01-06 NOTE — Addendum Note (Signed)
Addended by: Alisa Graff on: 01/06/2022 05:45 PM   Modules accepted: Orders

## 2022-01-07 ENCOUNTER — Other Ambulatory Visit: Payer: Self-pay | Admitting: Internal Medicine

## 2022-01-13 ENCOUNTER — Telehealth: Payer: Self-pay | Admitting: Internal Medicine

## 2022-01-13 NOTE — Telephone Encounter (Signed)
error 

## 2022-01-14 DIAGNOSIS — N761 Subacute and chronic vaginitis: Secondary | ICD-10-CM | POA: Diagnosis not present

## 2022-01-26 ENCOUNTER — Telehealth: Payer: Self-pay | Admitting: Internal Medicine

## 2022-01-26 NOTE — Telephone Encounter (Addendum)
Pt daughter n law is calling about FMLA extended paperwork. Pt daughter stated she sent it through the [place] email to the office. Pt daughter n law needs it by 10/11 and wants to pick it up when ready

## 2022-01-27 ENCOUNTER — Telehealth: Payer: BC Managed Care – PPO | Admitting: Internal Medicine

## 2022-01-28 NOTE — Telephone Encounter (Signed)
Form is completed.  Please contact to let her know.

## 2022-01-28 NOTE — Telephone Encounter (Signed)
Form is completed.

## 2022-02-10 ENCOUNTER — Ambulatory Visit: Payer: BC Managed Care – PPO | Admitting: Internal Medicine

## 2022-02-15 ENCOUNTER — Telehealth: Payer: Self-pay | Admitting: Internal Medicine

## 2022-02-15 NOTE — Telephone Encounter (Signed)
Please call in estradiol (ESTRACE) 0.1 MG/GM vaginal cream and progesterone (PROMETRIUM) 100 MG capsule. Her appointment was moved from 02/17/22 to 02/24/22.

## 2022-02-16 ENCOUNTER — Other Ambulatory Visit: Payer: Self-pay

## 2022-02-16 MED ORDER — PROGESTERONE MICRONIZED 100 MG PO CAPS
100.0000 mg | ORAL_CAPSULE | Freq: Every day | ORAL | 3 refills | Status: DC
Start: 2022-02-16 — End: 2022-06-30

## 2022-02-16 MED ORDER — ESTRADIOL 0.1 MG/GM VA CREA: 1.0000 | TOPICAL_CREAM | Freq: Every day | VAGINAL | 1 refills | Status: DC

## 2022-02-16 NOTE — Telephone Encounter (Signed)
Ok to refill 

## 2022-02-16 NOTE — Telephone Encounter (Signed)
Refills sent

## 2022-02-17 ENCOUNTER — Ambulatory Visit: Payer: BC Managed Care – PPO | Admitting: Internal Medicine

## 2022-02-24 ENCOUNTER — Ambulatory Visit: Payer: BC Managed Care – PPO | Admitting: Internal Medicine

## 2022-02-24 ENCOUNTER — Encounter: Payer: Self-pay | Admitting: Internal Medicine

## 2022-02-24 VITALS — BP 124/78 | HR 70 | Temp 98.7°F | Resp 16 | Ht 66.0 in | Wt 143.4 lb

## 2022-02-24 DIAGNOSIS — F329 Major depressive disorder, single episode, unspecified: Secondary | ICD-10-CM | POA: Diagnosis not present

## 2022-02-24 DIAGNOSIS — K219 Gastro-esophageal reflux disease without esophagitis: Secondary | ICD-10-CM

## 2022-02-24 DIAGNOSIS — F439 Reaction to severe stress, unspecified: Secondary | ICD-10-CM | POA: Diagnosis not present

## 2022-02-24 DIAGNOSIS — E2839 Other primary ovarian failure: Secondary | ICD-10-CM

## 2022-02-24 DIAGNOSIS — I471 Supraventricular tachycardia, unspecified: Secondary | ICD-10-CM

## 2022-02-24 NOTE — Progress Notes (Unsigned)
Patient ID: Theresa MERRIHEW, female   DOB: 1956/03/05, 66 y.o.   MRN: [id number]   Subjective:    Patient ID: Theresa Buck, female    DOB: [birth year], 66 y.o.   MRN: [id number]   Patient here for  Chief Complaint  Patient presents with   Follow-up   .   HPI Here to follow up - recent femoral neck fracture (09/2021) - also history of SVT s/p ablation and reflux.  Saw Dr Rockey Situ - recommended decrease metoprolol to '25mg'$  q day.  CTA - minimal coronary calcification, minimal stenosis of proximal LAD vessel. Saw Dr Posey Pronto 01/16/22 - knee pain - MRI- left medial meniscus tear that does not appear to have significantly changed in appearance compared to prior MRI approximately 4 years ago. Mild degenerative changes present on MRI.  Recommended meloxicam and PT.    Past Medical History:  Diagnosis Date   Allergy    recurring sinus problems   Anxiety    Arthritis    Chronic back pain    Deviated septum    Deviated septum    GERD (gastroesophageal reflux disease)    H/O sinusitis    Headache    H/O MIGRAINES   Hx of degenerative disc disease    Scoliosis    SVT (supraventricular tachycardia)    Past Surgical History:  Procedure Laterality Date   BACK SURGERY  09/26/2013   LUMBAR   COLONOSCOPY  08/03/2015   Dr Vira Agar   HEMORRHOID SURGERY N/A 03/13/2017   Procedure: HEMORRHOIDECTOMY;  Surgeon: Christene Lye, MD;  Location: [facility];  Service: General;  Laterality: N/A;   SVT ABLATION N/A 09/16/2020   Procedure: SVT ABLATION;  Surgeon: Vickie Epley, MD;  Location: [facility];  Service: Cardiovascular;  Laterality: N/A;   TOTAL HIP ARTHROPLASTY Left 10/13/2021   Procedure: TOTAL HIP ARTHROPLASTY ANTERIOR APPROACH;  Surgeon: Hessie Knows, MD;  Location: [facility];  Service: Orthopedics;  Laterality: Left;   UPPER GI ENDOSCOPY  08/03/2015   Dr Vira Agar   Family History  Problem Relation Age of Onset   Brain cancer Mother    ALS Father    Diabetes Brother         multiple   Heart disease Son        myocardial infarction, s/p CABG   Throat cancer Brother    Breast cancer Neg Hx    Social History   Socioeconomic History   Marital status: Widowed    Spouse name: Not on file   Number of children: 3   Years of education: Not on file   Highest education level: Not on file  Occupational History   Not on file  Tobacco Use   Smoking status: Never   Smokeless tobacco: Never  Vaping Use   Vaping Use: Never used  Substance and Sexual Activity   Alcohol use: No    Alcohol/week: 0.0 standard drinks of alcohol   Drug use: No   Sexual activity: Not on file  Other Topics Concern   Not on file  Social History Narrative   Not on file   Social Determinants of Health   Financial Resource Strain: Not on file  Food Insecurity: Not on file  Transportation Needs: Not on file  Physical Activity: Not on file  Stress: Not on file  Social Connections: Not on file     Review of Systems     Objective:     BP 124/78 (BP Location: Left Arm, Patient Position:  Sitting, Cuff Size: Small)   Pulse 70   Temp 98.7 F (37.1 C) (Temporal)   Resp 16   Ht '5\' 6"'$  (1.676 m)   Wt 143 lb 6.4 oz (65 kg)   LMP 07/15/2012   SpO2 98%   BMI 23.15 kg/m  Wt Readings from Last 3 Encounters:  02/24/22 143 lb 6.4 oz (65 kg)  11/14/21 139 lb 6 oz (63.2 kg)  10/28/21 141 lb 3.2 oz (64 kg)    Physical Exam   Outpatient Encounter Medications as of 02/24/2022  Medication Sig   Biotin 5000 MCG TABS Take 5,000 mcg by mouth daily.   Calcium Carb-Cholecalciferol (CALCIUM + D3 PO) Take 1 tablet daily by mouth.   carboxymethylcellulose (REFRESH PLUS) 0.5 % SOLN Place 1 drop into both eyes 3 (three) times daily as needed (dry eyes).   clobetasol cream (TEMOVATE) 0.05 % Apply to bumps on right side twice daily until improved. Avoid face, groin, axilla.   estradiol (ESTRACE) 0.1 MG/GM vaginal cream Place 1 Applicatorful vaginally daily.   estradiol (ESTRACE) 1 MG tablet  TAKE 1/2 TABLET(0.5 MG) BY MOUTH DAILY   hydrocortisone (ANUSOL-HC) 25 MG suppository Place 1 suppository (25 mg total) rectally 2 (two) times daily.   metoprolol succinate (TOPROL-XL) 25 MG 24 hr tablet Take 1 tablet (25 mg total) by mouth daily. Take with or immediately following a meal.   montelukast (SINGULAIR) 10 MG tablet Take 10 mg at bedtime by mouth.    Multiple Vitamin (MULTIVITAMIN) tablet Take 1 tablet by mouth daily.   pantoprazole (PROTONIX) 40 MG tablet TAKE 1 TABLET BY MOUTH EVERY DAY   Probiotic Product (PROBIOTIC ADVANCED PO) Take 1 capsule daily by mouth.    progesterone (PROMETRIUM) 100 MG capsule Take 100 mg daily by mouth.   progesterone (PROMETRIUM) 100 MG capsule Take 1 capsule (100 mg total) by mouth daily.   traZODone (DESYREL) 50 MG tablet Take 0.5-1 tablets (25-50 mg total) by mouth at bedtime as needed for sleep.   tretinoin (RETIN-A) 0.05 % cream Apply a small amount to affected areas face every night as tolerated.   vitamin C (ASCORBIC ACID) 500 MG tablet Take 500 mg by mouth daily.   [DISCONTINUED] metoprolol tartrate (LOPRESSOR) 100 MG tablet Take 1 tablet (100 mg total) by mouth once for 1 dose. Take two hours prior to your CT.   No facility-administered encounter medications on file as of 02/24/2022.     Lab Results  Component Value Date   WBC 8.4 11/25/2021   HGB 13.1 11/25/2021   HCT 39.4 11/25/2021   PLT 239.0 11/25/2021   GLUCOSE 93 12/02/2021   CHOL 192 07/14/2021   TRIG 77.0 07/14/2021   HDL 53.90 07/14/2021   LDLCALC 123 (H) 07/14/2021   ALT 19 10/16/2021   AST 20 10/16/2021   NA 139 12/02/2021   K 4.1 12/02/2021   CL 105 12/02/2021   CREATININE 0.69 12/02/2021   BUN 24 (H) 12/02/2021   CO2 27 12/02/2021   TSH 1.98 07/14/2021   INR 1.0 10/12/2021    MR KNEE LEFT WO CONTRAST  Result Date: 12/27/2021 CLINICAL DATA:  Diffuse left knee pain for 10 weeks. EXAM: MRI OF THE LEFT KNEE WITHOUT CONTRAST TECHNIQUE: Multiplanar, multisequence MR  imaging of the knee was performed. No intravenous contrast was administered. COMPARISON:  None Available. FINDINGS: MENISCI Medial: Degeneration of the posterior horn of the medial meniscus. Oblique tear of the posterior horn-body junction of the medial meniscus extending to the inferior articular  surface. Lateral: Intact. LIGAMENTS Cruciates: ACL and PCL are intact. Collaterals: Medial collateral ligament is intact. Lateral collateral ligament complex is intact. CARTILAGE Patellofemoral:  No chondral defect. Medial: Mild partial-thickness cartilage loss of the medial femorotibial compartment. Lateral:  No chondral defect. JOINT: No joint effusion. Normal Hoffa's fat-pad. No plical thickening. POPLITEAL FOSSA: Popliteus tendon is intact. No Baker's cyst. EXTENSOR MECHANISM: Intact quadriceps tendon. Intact patellar tendon. Intact lateral patellar retinaculum. Intact medial patellar retinaculum. Intact MPFL. BONES: No aggressive osseous lesion. No fracture or dislocation. Other: No fluid collection or hematoma. Mild muscle strain in the proximal aspect of the medial gastrocnemius muscle. IMPRESSION: 1. 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 Oblique tear of the posterior horn-body junction of the medial meniscus extending to the inferior articular surface. 2. Mild partial-thickness cartilage loss of the medial femorotibial compartment. 3. Mild muscle strain in the proximal aspect of the medial gastrocnemius muscle. Electronically Signed   By: Kathreen Devoid M.D.   On: 12/27/2021 05:56       Assessment & Plan:   Problem List Items Addressed This Visit   None Visit Diagnoses     Estrogen deficiency    -  Primary   Relevant Orders   DG Bone Density        Einar Pheasant, MD

## 2022-02-25 ENCOUNTER — Encounter: Payer: Self-pay | Admitting: Internal Medicine

## 2022-02-25 DIAGNOSIS — E2839 Other primary ovarian failure: Secondary | ICD-10-CM | POA: Insufficient documentation

## 2022-02-25 NOTE — Assessment & Plan Note (Addendum)
Discussed. Notify me if feels needs anything more.

## 2022-02-25 NOTE — Assessment & Plan Note (Signed)
Discussed need for bone density.  Schedule bone density.

## 2022-02-25 NOTE — Assessment & Plan Note (Addendum)
Increased stress. Discussed.  Family and work stress.  Notify me if feels needs any further intervention.  Follow.  

## 2022-02-25 NOTE — Assessment & Plan Note (Signed)
No upper symptoms reported.  On protonix.   

## 2022-02-25 NOTE — Assessment & Plan Note (Signed)
Dr Rockey Situ decreased metoprolol to '25mg'$  q day.  She is wanting to stop. D/w cardiology regarding having metoprolol prn.  Follow.  Blood pressure on my check 122/70.

## 2022-03-09 ENCOUNTER — Telehealth: Payer: Self-pay | Admitting: Internal Medicine

## 2022-03-09 ENCOUNTER — Other Ambulatory Visit
Admission: RE | Admit: 2022-03-09 | Discharge: 2022-03-09 | Disposition: A | Discharge status: Home / Self Care | Payer: BC Managed Care – PPO | Source: Ambulatory Visit | Attending: Student | Admitting: Student

## 2022-03-09 DIAGNOSIS — R5383 Other fatigue: Secondary | ICD-10-CM | POA: Insufficient documentation

## 2022-03-09 DIAGNOSIS — R002 Palpitations: Secondary | ICD-10-CM | POA: Diagnosis present

## 2022-03-09 DIAGNOSIS — I471 Supraventricular tachycardia, unspecified: Secondary | ICD-10-CM | POA: Diagnosis not present

## 2022-03-09 DIAGNOSIS — R42 Dizziness and giddiness: Secondary | ICD-10-CM | POA: Insufficient documentation

## 2022-03-09 LAB — TROPONIN I (HIGH SENSITIVITY): Troponin I (High Sensitivity): 5 ng/L (ref <18)

## 2022-03-09 NOTE — Telephone Encounter (Signed)
S/w pt - stated feeling lightheaded, weak, nauseous, and can feel her heart pounding in her chest.  Pt advised to go over to urgent care for eval just to make sure nothing acute going on. Recommended urgent cares: [facility] across the street, [facility], and [facility].  Given 730 appt tomorrow morning w/ Dr Nicki Reaper for follow up.

## 2022-03-09 NOTE — Telephone Encounter (Signed)
Please confirm evaluated and doing ok.

## 2022-03-09 NOTE — Telephone Encounter (Signed)
S/w pt - confirmed was evaluated, home now. Stated issues cleared up while over at [place].  Will keep AM appt tomorrow to discuss with you

## 2022-03-09 NOTE — Telephone Encounter (Signed)
Patient called and said her heart rate is up this morning to 106, 108, and 110. She is feeling light headed, she doesn't feel right. No appointments available at office at time of call. Patient was sent to Access [facility].

## 2022-03-10 ENCOUNTER — Ambulatory Visit (INDEPENDENT_AMBULATORY_CARE_PROVIDER_SITE_OTHER): Payer: BC Managed Care – PPO | Admitting: Internal Medicine

## 2022-03-10 DIAGNOSIS — I471 Supraventricular tachycardia, unspecified: Secondary | ICD-10-CM

## 2022-03-10 NOTE — Progress Notes (Deleted)
Patient ID: Theresa Buck, female   DOB: [birth year], 66 y.o.   MRN: [id number]   Subjective:    Patient ID: Theresa Buck, female    DOB: 1955-06-13, 66 y.o.   MRN: [id number]   Patient here for No chief complaint on file.  Marland Kitchen   HPI Work in appt.  Yesterday - felt light headed, weak and was experiencing nausea. Evaluated at [facility].  Troponin wnl.  PT - knee.   Past Medical History:  Diagnosis Date   Allergy    recurring sinus problems   Anxiety    Arthritis    Chronic back pain    Deviated septum    Deviated septum    GERD (gastroesophageal reflux disease)    H/O sinusitis    Headache    H/O MIGRAINES   Hx of degenerative disc disease    Scoliosis    SVT (supraventricular tachycardia)    Past Surgical History:  Procedure Laterality Date   BACK SURGERY  09/26/2013   LUMBAR   COLONOSCOPY  08/03/2015   Dr Vira Agar   HEMORRHOID SURGERY N/A 03/13/2017   Procedure: HEMORRHOIDECTOMY;  Surgeon: Christene Lye, MD;  Location: [facility];  Service: General;  Laterality: N/A;   SVT ABLATION N/A 09/16/2020   Procedure: SVT ABLATION;  Surgeon: Vickie Epley, MD;  Location: [facility];  Service: Cardiovascular;  Laterality: N/A;   TOTAL HIP ARTHROPLASTY Left 10/13/2021   Procedure: TOTAL HIP ARTHROPLASTY ANTERIOR APPROACH;  Surgeon: Hessie Knows, MD;  Location: [facility];  Service: Orthopedics;  Laterality: Left;   UPPER GI ENDOSCOPY  08/03/2015   Dr Vira Agar   Family History  Problem Relation Age of Onset   Brain cancer Mother    ALS Father    Diabetes Brother        multiple   Heart disease Son        myocardial infarction, s/p CABG   Throat cancer Brother    Breast cancer Neg Hx    Social History   Socioeconomic History   Marital status: Widowed    Spouse name: Not on file   Number of children: 3   Years of education: Not on file   Highest education level: Not on file  Occupational History   Not on file  Tobacco Use   Smoking status:  Never   Smokeless tobacco: Never  Vaping Use   Vaping Use: Never used  Substance and Sexual Activity   Alcohol use: No    Alcohol/week: 0.0 standard drinks of alcohol   Drug use: No   Sexual activity: Not on file  Other Topics Concern   Not on file  Social History Narrative   Not on file   Social Determinants of Health   Financial Resource Strain: Not on file  Food Insecurity: Not on file  Transportation Needs: Not on file  Physical Activity: Not on file  Stress: Not on file  Social Connections: Not on file     Review of Systems     Objective:     LMP 07/15/2012  Wt Readings from Last 3 Encounters:  02/24/22 143 lb 6.4 oz (65 kg)  11/14/21 139 lb 6 oz (63.2 kg)  10/28/21 141 lb 3.2 oz (64 kg)    Physical Exam   Outpatient Encounter Medications as of 03/10/2022  Medication Sig   Biotin 5000 MCG TABS Take 5,000 mcg by mouth daily.   Calcium Carb-Cholecalciferol (CALCIUM + D3 PO) Take 1 tablet daily by mouth.  carboxymethylcellulose (REFRESH PLUS) 0.5 % SOLN Place 1 drop into both eyes 3 (three) times daily as needed (dry eyes).   clobetasol cream (TEMOVATE) 0.05 % Apply to bumps on right side twice daily until improved. Avoid face, groin, axilla.   estradiol (ESTRACE) 0.1 MG/GM vaginal cream Place 1 Applicatorful vaginally daily.   estradiol (ESTRACE) 1 MG tablet TAKE 1/2 TABLET(0.5 MG) BY MOUTH DAILY   hydrocortisone (ANUSOL-HC) 25 MG suppository Place 1 suppository (25 mg total) rectally 2 (two) times daily.   metoprolol succinate (TOPROL-XL) 25 MG 24 hr tablet Take 1 tablet (25 mg total) by mouth daily. Take with or immediately following a meal.   montelukast (SINGULAIR) 10 MG tablet Take 10 mg at bedtime by mouth.    Multiple Vitamin (MULTIVITAMIN) tablet Take 1 tablet by mouth daily.   pantoprazole (PROTONIX) 40 MG tablet TAKE 1 TABLET BY MOUTH EVERY DAY   Probiotic Product (PROBIOTIC ADVANCED PO) Take 1 capsule daily by mouth.    progesterone (PROMETRIUM)  100 MG capsule Take 100 mg daily by mouth.   progesterone (PROMETRIUM) 100 MG capsule Take 1 capsule (100 mg total) by mouth daily.   traZODone (DESYREL) 50 MG tablet Take 0.5-1 tablets (25-50 mg total) by mouth at bedtime as needed for sleep.   tretinoin (RETIN-A) 0.05 % cream Apply a small amount to affected areas face every night as tolerated.   vitamin C (ASCORBIC ACID) 500 MG tablet Take 500 mg by mouth daily.   No facility-administered encounter medications on file as of 03/10/2022.     Lab Results  Component Value Date   WBC 8.4 11/25/2021   HGB 13.1 11/25/2021   HCT 39.4 11/25/2021   PLT 239.0 11/25/2021   GLUCOSE 93 12/02/2021   CHOL 192 07/14/2021   TRIG 77.0 07/14/2021   HDL 53.90 07/14/2021   LDLCALC 123 (H) 07/14/2021   ALT 19 10/16/2021   AST 20 10/16/2021   NA 139 12/02/2021   K 4.1 12/02/2021   CL 105 12/02/2021   CREATININE 0.69 12/02/2021   BUN 24 (H) 12/02/2021   CO2 27 12/02/2021   TSH 1.98 07/14/2021   INR 1.0 10/12/2021       Assessment & Plan:   Problem List Items Addressed This Visit   None    Einar Pheasant, MD

## 2022-03-11 ENCOUNTER — Encounter: Payer: Self-pay | Admitting: Internal Medicine

## 2022-03-11 NOTE — Progress Notes (Signed)
Patient ID: Theresa Buck, female   DOB: 02/03/1956, 66 y.o.   MRN: [id number] She cancelled appt.  Was not seen.

## 2022-04-06 ENCOUNTER — Encounter: Payer: Self-pay | Admitting: Orthopedic Surgery

## 2022-04-06 ENCOUNTER — Other Ambulatory Visit: Payer: Self-pay | Admitting: Orthopedic Surgery

## 2022-04-06 DIAGNOSIS — M25552 Pain in left hip: Secondary | ICD-10-CM

## 2022-04-18 ENCOUNTER — Other Ambulatory Visit: Payer: Self-pay | Admitting: Internal Medicine

## 2022-04-27 ENCOUNTER — Other Ambulatory Visit: Payer: Self-pay | Admitting: Internal Medicine

## 2022-04-28 ENCOUNTER — Ambulatory Visit
Admission: RE | Admit: 2022-04-28 | Discharge: 2022-04-28 | Disposition: A | Discharge status: Home / Self Care | Payer: BC Managed Care – PPO | Source: Ambulatory Visit | Attending: Orthopedic Surgery | Admitting: Orthopedic Surgery

## 2022-04-28 DIAGNOSIS — M25552 Pain in left hip: Secondary | ICD-10-CM

## 2022-05-04 ENCOUNTER — Encounter: Payer: Self-pay | Admitting: Orthopedic Surgery

## 2022-05-04 ENCOUNTER — Other Ambulatory Visit: Payer: BC Managed Care – PPO

## 2022-05-18 ENCOUNTER — Ambulatory Visit
Admission: RE | Admit: 2022-05-18 | Discharge: 2022-05-18 | Disposition: A | Discharge status: Home / Self Care | Payer: BC Managed Care – PPO | Source: Ambulatory Visit | Attending: Internal Medicine | Admitting: Internal Medicine

## 2022-05-18 DIAGNOSIS — E2839 Other primary ovarian failure: Secondary | ICD-10-CM | POA: Insufficient documentation

## 2022-05-19 ENCOUNTER — Telehealth: Payer: Self-pay

## 2022-05-19 NOTE — Telephone Encounter (Signed)
Patient returned Kathy's phone call.

## 2022-05-19 NOTE — Telephone Encounter (Signed)
Left message to return call for additional questions.

## 2022-05-19 NOTE — Telephone Encounter (Signed)
   LM FOR PT TO CB   Einar Pheasant, MD  [facility] Notify - bone density reveals osteoporosis.  Recommend calcium, vitamin D and weight bearing exercise (for example walking, walking with hand weights).  Will also discuss further treatment with her at her upcoming appt.  Please make note on schedule to discuss bone density.

## 2022-05-19 NOTE — Telephone Encounter (Signed)
Pt called back and I read the message to her but the pt still had questions about the message

## 2022-06-02 ENCOUNTER — Ambulatory Visit: Payer: BC Managed Care – PPO | Admitting: Internal Medicine

## 2022-06-02 ENCOUNTER — Encounter: Payer: Self-pay | Admitting: Internal Medicine

## 2022-06-02 VITALS — BP 126/74 | HR 76 | Temp 98.0°F | Resp 16 | Ht 66.0 in | Wt 141.0 lb

## 2022-06-02 DIAGNOSIS — M810 Age-related osteoporosis without current pathological fracture: Secondary | ICD-10-CM

## 2022-06-02 DIAGNOSIS — K219 Gastro-esophageal reflux disease without esophagitis: Secondary | ICD-10-CM | POA: Diagnosis not present

## 2022-06-02 DIAGNOSIS — F439 Reaction to severe stress, unspecified: Secondary | ICD-10-CM | POA: Diagnosis not present

## 2022-06-02 DIAGNOSIS — Z1231 Encounter for screening mammogram for malignant neoplasm of breast: Secondary | ICD-10-CM

## 2022-06-02 DIAGNOSIS — I471 Supraventricular tachycardia, unspecified: Secondary | ICD-10-CM

## 2022-06-02 DIAGNOSIS — Z79899 Other long term (current) drug therapy: Secondary | ICD-10-CM | POA: Diagnosis not present

## 2022-06-02 NOTE — Progress Notes (Unsigned)
Subjective:    Patient ID: Theresa Buck, female    DOB: 1955/05/29, 67 y.o.   MRN: [id number]  Patient here for No chief complaint on file.   HPI Here to follow up regarding SVT and increased stress.  Also is s/p left total hip replacement 10/13/21.  Seeing ortho.  Evaluated 05/12/22.  S/p injection.  Planning for colonoscopy 06/12/22.    Past Medical History:  Diagnosis Date   Allergy    recurring sinus problems   Anxiety    Arthritis    Chronic back pain    Deviated septum    Deviated septum    GERD (gastroesophageal reflux disease)    H/O sinusitis    Headache    H/O MIGRAINES   Hx of degenerative disc disease    Scoliosis    SVT (supraventricular tachycardia)    Past Surgical History:  Procedure Laterality Date   BACK SURGERY  09/26/2013   LUMBAR   COLONOSCOPY  08/03/2015   Dr Vira Agar   HEMORRHOID SURGERY N/A 03/13/2017   Procedure: HEMORRHOIDECTOMY;  Surgeon: Christene Lye, MD;  Location: [facility];  Service: General;  Laterality: N/A;   SVT ABLATION N/A 09/16/2020   Procedure: SVT ABLATION;  Surgeon: Vickie Epley, MD;  Location: [facility];  Service: Cardiovascular;  Laterality: N/A;   TOTAL HIP ARTHROPLASTY Left 10/13/2021   Procedure: TOTAL HIP ARTHROPLASTY ANTERIOR APPROACH;  Surgeon: Hessie Knows, MD;  Location: [facility];  Service: Orthopedics;  Laterality: Left;   UPPER GI ENDOSCOPY  08/03/2015   Dr Vira Agar   Family History  Problem Relation Age of Onset   Brain cancer Mother    ALS Father    Diabetes Brother        multiple   Heart disease Son        myocardial infarction, s/p CABG   Throat cancer Brother    Breast cancer Neg Hx    Social History   Socioeconomic History   Marital status: Widowed    Spouse name: Not on file   Number of children: 3   Years of education: Not on file   Highest education level: Not on file  Occupational History   Not on file  Tobacco Use   Smoking status: Never   Smokeless tobacco: Never   Vaping Use   Vaping Use: Never used  Substance and Sexual Activity   Alcohol use: No    Alcohol/week: 0.0 standard drinks of alcohol   Drug use: No   Sexual activity: Not on file  Other Topics Concern   Not on file  Social History Narrative   Not on file   Social Determinants of Health   Financial Resource Strain: Not on file  Food Insecurity: Not on file  Transportation Needs: Not on file  Physical Activity: Not on file  Stress: Not on file  Social Connections: Not on file     Review of Systems     Objective:     LMP 07/15/2012  Wt Readings from Last 3 Encounters:  02/24/22 143 lb 6.4 oz (65 kg)  11/14/21 139 lb 6 oz (63.2 kg)  10/28/21 141 lb 3.2 oz (64 kg)    Physical Exam   Outpatient Encounter Medications as of 06/02/2022  Medication Sig   Biotin 5000 MCG TABS Take 5,000 mcg by mouth daily.   Calcium Carb-Cholecalciferol (CALCIUM + D3 PO) Take 1 tablet daily by mouth.   carboxymethylcellulose (REFRESH PLUS) 0.5 % SOLN Place 1 drop into  both eyes 3 (three) times daily as needed (dry eyes).   clobetasol cream (TEMOVATE) 0.05 % Apply to bumps on right side twice daily until improved. Avoid face, groin, axilla.   estradiol (ESTRACE) 0.1 MG/GM vaginal cream INSERT 1 APPLICATORFUL VAGINALLY DAILY   estradiol (ESTRACE) 1 MG tablet TAKE 1/2 TABLET(0.5 MG) BY MOUTH DAILY   hydrocortisone (ANUSOL-HC) 25 MG suppository Place 1 suppository (25 mg total) rectally 2 (two) times daily.   metoprolol succinate (TOPROL-XL) 25 MG 24 hr tablet Take 1 tablet (25 mg total) by mouth daily. Take with or immediately following a meal.   montelukast (SINGULAIR) 10 MG tablet Take 10 mg at bedtime by mouth.    Multiple Vitamin (MULTIVITAMIN) tablet Take 1 tablet by mouth daily.   pantoprazole (PROTONIX) 40 MG tablet TAKE 1 TABLET BY MOUTH EVERY DAY   Probiotic Product (PROBIOTIC ADVANCED PO) Take 1 capsule daily by mouth.    progesterone (PROMETRIUM) 100 MG capsule Take 100 mg daily by  mouth.   progesterone (PROMETRIUM) 100 MG capsule Take 1 capsule (100 mg total) by mouth daily.   traZODone (DESYREL) 50 MG tablet Take 0.5-1 tablets (25-50 mg total) by mouth at bedtime as needed for sleep.   tretinoin (RETIN-A) 0.05 % cream Apply a small amount to affected areas face every night as tolerated.   vitamin C (ASCORBIC ACID) 500 MG tablet Take 500 mg by mouth daily.   No facility-administered encounter medications on file as of 06/02/2022.     Lab Results  Component Value Date   WBC 8.4 11/25/2021   HGB 13.1 11/25/2021   HCT 39.4 11/25/2021   PLT 239.0 11/25/2021   GLUCOSE 93 12/02/2021   CHOL 192 07/14/2021   TRIG 77.0 07/14/2021   HDL 53.90 07/14/2021   LDLCALC 123 (H) 07/14/2021   ALT 19 10/16/2021   AST 20 10/16/2021   NA 139 12/02/2021   K 4.1 12/02/2021   CL 105 12/02/2021   CREATININE 0.69 12/02/2021   BUN 24 (H) 12/02/2021   CO2 27 12/02/2021   TSH 1.98 07/14/2021   INR 1.0 10/12/2021    DG Bone Density  Result Date: 05/18/2022 EXAM: DUAL X-RAY ABSORPTIOMETRY (DXA) FOR BONE MINERAL DENSITY IMPRESSION: Your patient Theresa Buck completed a BMD test on 05/18/2022 using the [place] (software version: 14.10) manufactured by [employer]. The following summarizes the results of our evaluation. Technologist::TNB PATIENT BIOGRAPHICAL: Name: Theresa Buck, Theresa Buck Patient ID: [id number] Birth Date: [birth year] Height: 66.0 in. Gender: Female Exam Date: 05/18/2022 Weight: 143.4 lbs. Indications: Caucasian, Hip Replacement Left, Postmenopausal Fractures: Treatments: calcium w/ vit D, Estradiol, Progesterone DENSITOMETRY RESULTS: Site         Region     Measured Date Measured Age WHO Classification Young Adult T-score BMD         %Change vs. Previous Significant Change (*) Left Forearm Radius 33% 05/18/2022 65.2 Osteopenia -2.1 0.690 g/cm2 - - Right Femur Neck 05/18/2022 65.2 Osteoporosis -2.6 0.674 g/cm2 - - ASSESSMENT: The BMD measured at Femur Neck is  0.674 g/cm2 with a T-score of -2.6. This patient is considered osteoporotic according to [facility]) criteria. Lumbar spine was not utilized due to scoliosis. Patient is not a candidate for FRAX due to diagnosis of osteoporosis. The scan quality is good. World Health Organization [facility]) criteria for post-menopausal, Caucasian Women: Normal:                   T-score at or above -1  SD Osteopenia/low bone mass: T-score between -1 and -2.5 SD Osteoporosis:             T-score at or below -2.5 SD RECOMMENDATIONS: 1. All patients should optimize calcium and vitamin D intake. 2. Consider FDA-approved medical therapies in postmenopausal women and men aged 36 years and older, based on the following: a. A hip or vertebral(clinical or morphometric) fracture b. T-score < -2.5 at the femoral neck or spine after appropriate evaluation to exclude secondary causes c. Low bone mass (T-score between -1.0 and -2.5 at the femoral neck or spine) and a 10-year probability of a hip fracture > 3% or a 10-year probability of a major osteoporosis-related fracture > 20% based on the [place]-adapted WHO algorithm 3. Clinician judgment and/or patient preferences may indicate treatment for people with 10-year fracture probabilities above or below these levels FOLLOW-UP: People with diagnosed cases of osteoporosis or at high risk for fracture should have regular bone mineral density tests. For patients eligible for Medicare, routine testing is allowed once every 2 years. The testing frequency can be increased to one year for patients who have rapidly progressing disease, those who are receiving or discontinuing medical therapy to restore bone mass, or have additional risk factors. I have reviewed this report, and agree with the above findings. [facility] Electronically Signed   By: Ammie Ferrier M.D.   On: 05/18/2022 15:44       Assessment & Plan:  There are no diagnoses linked to this  encounter.   Einar Pheasant, MD

## 2022-06-02 NOTE — Patient Instructions (Signed)
Oscal 593m with vitamin D or caltrate 6026mplus vitamin D - take one tablet twice a day  Oral medications:  fosamax, boniva, actonel  IV medication - reclast (one time per year)  Injection- prolia - every 6 months.

## 2022-06-04 ENCOUNTER — Encounter: Payer: Self-pay | Admitting: Internal Medicine

## 2022-06-04 DIAGNOSIS — M810 Age-related osteoporosis without current pathological fracture: Secondary | ICD-10-CM | POA: Insufficient documentation

## 2022-06-04 NOTE — Assessment & Plan Note (Signed)
Discussed bone density results.  Discussed calcium and vitamin D and weight bearing exercise.  Discussed treatment options.  Will notify me if desires to start prescription medication.

## 2022-06-04 NOTE — Assessment & Plan Note (Signed)
Increased stress. Discussed.  Family and work stress.  Notify me if feels needs any further intervention.  Follow.

## 2022-06-04 NOTE — Assessment & Plan Note (Signed)
Discussed.  Wants to continue.  Discussed possible taper in future.

## 2022-06-04 NOTE — Assessment & Plan Note (Signed)
Sees Dr Rockey Situ. On metoprolol 41m.  Follow.  Stable.

## 2022-06-04 NOTE — Assessment & Plan Note (Signed)
No upper symptoms reported. Has been on protonix. Discussed protonix and possible side effects.   Discussed dosing.

## 2022-06-12 ENCOUNTER — Encounter (INDEPENDENT_AMBULATORY_CARE_PROVIDER_SITE_OTHER): Payer: BC Managed Care – PPO

## 2022-06-12 DIAGNOSIS — Z86010 Personal history of colonic polyps: Secondary | ICD-10-CM

## 2022-06-12 DIAGNOSIS — K640 First degree hemorrhoids: Secondary | ICD-10-CM

## 2022-06-15 ENCOUNTER — Other Ambulatory Visit: Payer: BC Managed Care – PPO

## 2022-06-30 ENCOUNTER — Other Ambulatory Visit: Payer: Self-pay

## 2022-06-30 ENCOUNTER — Other Ambulatory Visit: Payer: Self-pay | Admitting: Internal Medicine

## 2022-06-30 DIAGNOSIS — Z79899 Other long term (current) drug therapy: Secondary | ICD-10-CM

## 2022-06-30 MED ORDER — PROGESTERONE MICRONIZED 100 MG PO CAPS: 100.0000 mg | ORAL_CAPSULE | Freq: Every day | ORAL | 3 refills | Status: DC

## 2022-07-03 ENCOUNTER — Ambulatory Visit: Payer: BC Managed Care – PPO | Admitting: Dermatology

## 2022-07-03 VITALS — BP 125/71 | HR 72

## 2022-07-03 DIAGNOSIS — L720 Epidermal cyst: Secondary | ICD-10-CM | POA: Diagnosis not present

## 2022-07-03 DIAGNOSIS — L739 Follicular disorder, unspecified: Secondary | ICD-10-CM | POA: Diagnosis not present

## 2022-07-03 DIAGNOSIS — D22121 Melanocytic nevi of left upper eyelid, including canthus: Secondary | ICD-10-CM

## 2022-07-03 DIAGNOSIS — D229 Melanocytic nevi, unspecified: Secondary | ICD-10-CM

## 2022-07-03 DIAGNOSIS — D485 Neoplasm of uncertain behavior of skin: Secondary | ICD-10-CM

## 2022-07-03 MED ORDER — MOMETASONE FUROATE 0.1 % EX SOLN: CUTANEOUS | 1 refills | Status: AC

## 2022-07-03 NOTE — Patient Instructions (Signed)
Due to recent changes in healthcare laws, you may see results of your pathology and/or laboratory studies on MyChart before the doctors have had a chance to review them. We understand that in some cases there may be results that are confusing or concerning to you. Please understand that not all results are received at the same time and often the doctors may need to interpret multiple results in order to provide you with the best plan of care or course of treatment. Therefore, we ask that you please give us 2 business days to thoroughly review all your results before contacting the office for clarification. Should we see a critical lab result, you will be contacted sooner.   If You Need Anything After Your Visit  If you have any questions or concerns for your doctor, please call our main line at [phone] and press option 4 to reach your doctor's medical assistant. If no one answers, please leave a voicemail as directed and we will return your call as soon as possible. Messages left after 4 pm will be answered the following business day.   You may also send us a message via MyChart. We typically respond to MyChart messages within 1-2 business days.  For prescription refills, please ask your pharmacy to contact our office. Our fax number is [phone].  If you have an urgent issue when the clinic is closed that cannot wait until the next business day, you can page your doctor at the number below.    Please note that while we do our best to be available for urgent issues outside of office hours, we are not available 24/7.   If you have an urgent issue and are unable to reach us, you may choose to seek medical care at your doctor's office, retail clinic, urgent care center, or emergency room.  If you have a medical emergency, please immediately call 911 or go to the emergency department.  Pager Numbers  - Dr. Kowalski: [phone]  - Dr. Moye: [phone]  - Dr. Stewart:  [phone]  In the event of inclement weather, please call our main line at [phone] for an update on the status of any delays or closures.  Dermatology Medication Tips: Please keep the boxes that topical medications come in in order to help keep track of the instructions about where and how to use these. Pharmacies typically print the medication instructions only on the boxes and not directly on the medication tubes.   If your medication is too expensive, please contact our office at [phone] option 4 or send us a message through MyChart.   We are unable to tell what your co-pay for medications will be in advance as this is different depending on your insurance coverage. However, we may be able to find a substitute medication at lower cost or fill out paperwork to get insurance to cover a needed medication.   If a prior authorization is required to get your medication covered by your insurance company, please allow us 1-2 business days to complete this process.  Drug prices often vary depending on where the prescription is filled and some pharmacies may offer cheaper prices.  The website www.goodrx.com contains coupons for medications through different pharmacies. The prices here do not account for what the cost may be with help from insurance (it may be cheaper with your insurance), but the website can give you the price if you did not use any insurance.  - You can print the associated coupon and take it with   your prescription to the pharmacy.  - You may also stop by our office during regular business hours and pick up a GoodRx coupon card.  - If you need your prescription sent electronically to a different pharmacy, notify our office through [facility] MyChart or by phone at [phone] option 4.     Si Usted Necesita Algo Despus de Su Visita  Tambin puede enviarnos un mensaje a travs de MyChart. Por lo general respondemos a los mensajes de MyChart en el transcurso de 1 a 2  das hbiles.  Para renovar recetas, por favor pida a su farmacia que se ponga en contacto con nuestra oficina. Nuestro nmero de fax es el [phone].  Si tiene un asunto urgente cuando la clnica est cerrada y que no puede esperar hasta el siguiente da hbil, puede llamar/localizar a su doctor(a) al nmero que aparece a continuacin.   Por favor, tenga en cuenta que aunque hacemos todo lo posible para estar disponibles para asuntos urgentes fuera del horario de oficina, no estamos disponibles las 24 horas del da, los 7 das de la semana.   Si tiene un problema urgente y no puede comunicarse con nosotros, puede optar por buscar atencin mdica  en el consultorio de su doctor(a), en una clnica privada, en un centro de atencin urgente o en una sala de emergencias.  Si tiene una emergencia mdica, por favor llame inmediatamente al 911 o vaya a la sala de emergencias.  Nmeros de bper  - Dr. Kowalski: [phone]  - Dra. Moye: [phone]  - Dra. Stewart: [phone]  En caso de inclemencias del tiempo, por favor llame a nuestra lnea principal al [phone] para una actualizacin sobre el estado de cualquier retraso o cierre.  Consejos para la medicacin en dermatologa: Por favor, guarde las cajas en las que vienen los medicamentos de uso tpico para ayudarle a seguir las instrucciones sobre dnde y cmo usarlos. Las farmacias generalmente imprimen las instrucciones del medicamento slo en las cajas y no directamente en los tubos del medicamento.   Si su medicamento es muy caro, por favor, pngase en contacto con nuestra oficina llamando al [phone] y presione la opcin 4 o envenos un mensaje a travs de MyChart.   No podemos decirle cul ser su copago por los medicamentos por adelantado ya que esto es diferente dependiendo de la cobertura de su seguro. Sin embargo, es posible que podamos encontrar un medicamento sustituto a menor costo o llenar un formulario para que el  seguro cubra el medicamento que se considera necesario.   Si se requiere una autorizacin previa para que su compaa de seguros cubra su medicamento, por favor permtanos de 1 a 2 das hbiles para completar este proceso.  Los precios de los medicamentos varan con frecuencia dependiendo del lugar de dnde se surte la receta y alguna farmacias pueden ofrecer precios ms baratos.  El sitio web www.goodrx.com tiene cupones para medicamentos de diferentes farmacias. Los precios aqu no tienen en cuenta lo que podra costar con la ayuda del seguro (puede ser ms barato con su seguro), pero el sitio web puede darle el precio si no utiliz ningn seguro.  - Puede imprimir el cupn correspondiente y llevarlo con su receta a la farmacia.  - Tambin puede pasar por nuestra oficina durante el horario de atencin regular y recoger una tarjeta de cupones de GoodRx.  - Si necesita que su receta se enve electrnicamente a una farmacia diferente, informe a nuestra oficina a travs de MyChart de [facility]   o por telfono llamando al [phone] y presione la opcin 4.  

## 2022-07-03 NOTE — Progress Notes (Signed)
   Follow-Up Visit   Subjective  Theresa Buck is a 67 y.o. female who presents for the following: Bump ([place]. Growing, tried tretinoin cream, but no improvement. Glasses rub and gets irritated. ).  Also has a few other spots to check.   The following portions of the chart were reviewed this encounter and updated as appropriate:       Review of Systems:  No other skin or systemic complaints except as noted in HPI or Assessment and Plan.  Objective  Well appearing patient in no apparent distress; mood and affect are within normal limits.  A focused examination was performed including face. Relevant physical exam findings are noted in the Assessment and Plan.  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 Anesthesia: the lesion was anesthetized in a standard fashion   Anesthetic:  1% lidocaine w/ epinephrine 1-100,000 buffered w/ 8.4% NaHCO3 Instrument used: flexible razor blade   Hemostasis achieved with: pressure, aluminum chloride and electrodesiccation   Outcome: patient tolerated procedure well    Destruction of lesion  Destruction method: electrodesiccation and curettage   Informed consent: discussed and consent obtained   Curettage performed in three different directions: Yes   Electrodesiccation performed over the curetted area: Yes   Final wound size (cm):  0.3 Hemostasis achieved with:  pressure, aluminum chloride and electrodesiccation Outcome: patient tolerated procedure well with no complications   Post-procedure details: wound care instructions given    Post-procedure details comment:  Ointment and bandage applied.  Specimen 1 - Surgical pathology Differential Diagnosis: Sebaceous Hyperplasia vs Fibrous Papule r/o BCC Check Margins: No *3 tiny specimen EDC today  SH vs FP r/o BCC  Epidermal cyst right medial cheek  Benign-appearing. Exam most consistent with an epidermal inclusion cyst. Discussed that a cyst is a benign growth that can grow over time and sometimes get irritated or inflamed. Recommend observation if it is not bothersome. Discussed option of surgical excision to remove it if it is growing, symptomatic, or other changes noted. Please call for new or changing lesions so they can be evaluated.     Nevus Left Upper Eyelid at Margin  Nevus vs Hidrocystoma vs other Cyst.    Benign-appearing, but has been growing per pt and is rubbing/irritating eye  Will send referral for removal to Dr Norlene Duel at [facility].   Folliculitis Mid Frontal Scalp  Start mometasone lotion Apply to AA scalp QD/BID until improved.  Avoid picking. Recommend Head & Shoulders shampoo. Sample given. Massage into scalp and let sit several minutes before rinsing 2-3x/wk.   mometasone (ELOCON) 0.1 % lotion - Mid Frontal Scalp Apply to affected area scalp once to twice daily until improved.   Return in about 6 months (around 01/03/2023) for check face.  IJamesetta Orleans, CMA, am acting as scribe for Brendolyn Patty, MD .  Documentation: I have reviewed the above documentation for accuracy and completeness, and I agree with the above.  Brendolyn Patty MD

## 2022-07-04 ENCOUNTER — Other Ambulatory Visit: Payer: Self-pay

## 2022-07-04 DIAGNOSIS — D485 Neoplasm of uncertain behavior of skin: Secondary | ICD-10-CM

## 2022-07-10 ENCOUNTER — Telehealth: Payer: Self-pay

## 2022-07-10 NOTE — Telephone Encounter (Signed)
Advised patient of biopsy results, no further treatment needed.

## 2022-07-10 NOTE — Telephone Encounter (Signed)
-----   Message from Brendolyn Patty, MD sent at 07/10/2022  1:23 PM EDT ----- Skin , right nasal root KERATIN GRANULOMA  Benign, c/w ruptured cyst, treated with EDC at time of biopsy - please call patient

## 2022-07-12 ENCOUNTER — Other Ambulatory Visit: Payer: Self-pay | Admitting: Internal Medicine

## 2022-07-14 ENCOUNTER — Telehealth: Payer: Self-pay | Admitting: Internal Medicine

## 2022-07-14 NOTE — Telephone Encounter (Signed)
Patient called and stated that she received a letter from [facility] that it is time to schedule her mammogram. She wanted to know if she needs a referral for that.

## 2022-07-14 NOTE — Telephone Encounter (Signed)
Order is in. Pt will call to schedule. Pt aware

## 2022-07-17 ENCOUNTER — Other Ambulatory Visit: Payer: Self-pay | Admitting: Orthopedic Surgery

## 2022-07-17 DIAGNOSIS — G8929 Other chronic pain: Secondary | ICD-10-CM

## 2022-07-20 ENCOUNTER — Encounter
Admission: RE | Admit: 2022-07-20 | Discharge: 2022-07-20 | Disposition: A | Discharge status: Home / Self Care | Payer: BC Managed Care – PPO | Source: Ambulatory Visit | Attending: Orthopedic Surgery | Admitting: Orthopedic Surgery

## 2022-07-20 DIAGNOSIS — M25552 Pain in left hip: Secondary | ICD-10-CM | POA: Insufficient documentation

## 2022-07-20 DIAGNOSIS — G8929 Other chronic pain: Secondary | ICD-10-CM | POA: Insufficient documentation

## 2022-07-20 DIAGNOSIS — Z96642 Presence of left artificial hip joint: Secondary | ICD-10-CM | POA: Diagnosis present

## 2022-07-20 MED ORDER — TECHNETIUM TC 99M MEDRONATE IV KIT
20.0000 | PACK | Freq: Once | INTRAVENOUS | Status: AC | PRN
  Administered 2022-07-20: 22.08 via INTRAVENOUS

## 2022-07-28 ENCOUNTER — Other Ambulatory Visit: Payer: BC Managed Care – PPO

## 2022-07-28 ENCOUNTER — Inpatient Hospital Stay: Admission: RE | Admit: 2022-07-28 | Payer: BC Managed Care – PPO | Source: Ambulatory Visit

## 2022-08-07 ENCOUNTER — Telehealth: Payer: Self-pay | Admitting: Internal Medicine

## 2022-08-07 NOTE — Telephone Encounter (Signed)
Pt would like to be called regarding medication that she would like to take. She did not tell me which one

## 2022-08-07 NOTE — Telephone Encounter (Signed)
LMTCB

## 2022-08-07 NOTE — Telephone Encounter (Signed)
Pt called back and stated to disregard the previous message would like to speak with the provider about it due to reading all of the side effects that she was reading about the medication that she was wanting.

## 2022-08-08 NOTE — Telephone Encounter (Signed)
Called patient to clarify what was needed. She was calling to discuss osteoporosis medication but has decided not to take anything and says she will discuss with Dr Lorin Picket at her appt in May

## 2022-08-11 ENCOUNTER — Ambulatory Visit
Admission: RE | Admit: 2022-08-11 | Discharge: 2022-08-11 | Disposition: A | Discharge status: Home / Self Care | Payer: BC Managed Care – PPO | Source: Ambulatory Visit | Attending: Internal Medicine | Admitting: Internal Medicine

## 2022-08-11 DIAGNOSIS — Z1231 Encounter for screening mammogram for malignant neoplasm of breast: Secondary | ICD-10-CM | POA: Diagnosis present

## 2022-08-22 ENCOUNTER — Ambulatory Visit: Payer: BC Managed Care – PPO | Attending: Internal Medicine | Admitting: Physical Therapy

## 2022-08-22 ENCOUNTER — Encounter: Payer: Self-pay | Admitting: Physical Therapy

## 2022-08-22 DIAGNOSIS — M6281 Muscle weakness (generalized): Secondary | ICD-10-CM | POA: Insufficient documentation

## 2022-08-22 DIAGNOSIS — R262 Difficulty in walking, not elsewhere classified: Secondary | ICD-10-CM

## 2022-08-22 DIAGNOSIS — M25552 Pain in left hip: Secondary | ICD-10-CM | POA: Insufficient documentation

## 2022-08-22 NOTE — Therapy (Signed)
OUTPATIENT PHYSICAL THERAPY EVALUATION   Patient Name: Theresa Buck MRN: [id number] DOB:[birth year], 67 y.o., female Today's Date: 08/22/2022  END OF SESSION:  PT End of Session - 08/22/22 2012     Visit Number 1    Number of Visits 13    Date for PT Re-Evaluation 11/14/22    Authorization Type [employer] reporting period from 08/22/2022    Authorization Time Period VL: 30 PT/OT/Chiro - 24 remain    Authorization - Visit Number 1    Authorization - Number of Visits 24    Progress Note Due on Visit 10    PT Start Time 1820    PT Stop Time 1907    PT Time Calculation (min) 47 min    Activity Tolerance Patient tolerated treatment well    Behavior During Therapy WFL for tasks assessed/performed             Past Medical History:  Diagnosis Date   Allergy    recurring sinus problems   Anxiety    Arthritis    Chronic back pain    Deviated septum    Deviated septum    GERD (gastroesophageal reflux disease)    H/O sinusitis    Headache    H/O MIGRAINES   Hx of degenerative disc disease    Scoliosis    SVT (supraventricular tachycardia)    Past Surgical History:  Procedure Laterality Date   BACK SURGERY  09/26/2013   LUMBAR   COLONOSCOPY  08/03/2015   Dr Mechele Collin   HEMORRHOID SURGERY N/A 03/13/2017   Procedure: HEMORRHOIDECTOMY;  Surgeon: Kieth Brightly, MD;  Location: [facility];  Service: General;  Laterality: N/A;   SVT ABLATION N/A 09/16/2020   Procedure: SVT ABLATION;  Surgeon: Lanier Prude, MD;  Location: [facility];  Service: Cardiovascular;  Laterality: N/A;   TOTAL HIP ARTHROPLASTY Left 10/13/2021   Procedure: TOTAL HIP ARTHROPLASTY ANTERIOR APPROACH;  Surgeon: Kennedy Bucker, MD;  Location: [facility];  Service: Orthopedics;  Laterality: Left;   UPPER GI ENDOSCOPY  08/03/2015   Dr Mechele Collin   Patient Active Problem List   Diagnosis Date Noted   Osteoporosis 06/04/2022   Estrogen deficiency 02/25/2022   Encounter for completion  of form with patient 10/29/2021   Leukocytosis 10/28/2021   Femoral neck fracture (HCC) 10/12/2021   LBBB (left bundle branch block) 10/12/2021   Depression 10/12/2021   Pre-op evaluation    Vaginal lesion 06/15/2021   Constipation 06/11/2021   Back pain 12/12/2020   Current use of estrogen therapy 12/12/2020   COVID-19 virus infection 05/02/2020   Hearing loss 06/30/2018   Hemorrhoids 12/10/2016   Fullness of breast 08/11/2015   Epigastric pain 02/14/2015   Pain of left foot 08/16/2014   Health care maintenance 08/16/2014   Other specified symptoms and signs involving the digestive system and abdomen 07/16/2014   Mastodynia 04/06/2013   Acute vaginitis 02/09/2013   Supraventricular tachycardia (HCC) 08/27/2012   Stress 08/27/2012   Other nonmedicinal substance allergy status 04/07/2012   Gastro-esophageal reflux disease without esophagitis 04/07/2012    PCP: Dale Kennard, MD  REFERRING PROVIDER: Reinaldo Berber, MD  REFERRING DIAG: 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 Rosita Kea on 10/13/2021 due to fall that resulted in femoral neck fracture. She has had L hip MRI and bone scan that does not explain her continued pain in the groin and hip flexor region. She has underwent LEFT ultrasound guided iliopsoas bursa Injection and  greater trochanter injections without sufficient relief. She is now at PT due to recommendation of trial of dry needling.   She states she had only a 6 week follow up after her surgery, but not a 3 month follow up. She did physical therapy up to this point,  then was released to go back to work. She had some exercises she did at home but continued to have trouble, so she went back to the doctor at 6 months due to continued pain. That is when her doctor identified she should not be having this pain. She went back to PT per MD recommendation, but she eventually stopped because she felt like she was not helping. She states Dr. Rosita Kea asked for a second opinion from Dr. Audelia Acton due to it being difficult to determine why she was still having pain. She has now had an MRI and bone scan which confirms there is no loosening of the prosthesis or other identifiable problems in imaging with bones or tissue that would explain her pain. She had two shots about two months ago, she thinks the lateral hip pain was slightly improved by the injection.   She states when she walks a certain distance she gets pain in the L anterior thigh from her hip towards her knee. She also has pain in the left groin. She also has trouble shag dancing. She can almost get to the end of the song before she is in too much pain to continue. She has to rest for 2-3 more songs before she can think about getting out on the floor again. She thinks she should probably stop half way through the song but she pushes herself to the end of the song.   The pain she gets while dancing and when walking is the same pain and more related to the anterior and lateral hip and across the anterior thigh to the knee. She states she has put on weight since the surgery because she has not been able to be as active as usual.   She is returning to PT now as Dr. Rosita Kea recommended after the negative imaging. He also wants her to try dry needling.   Before she had an MRI she did go to a chiropractor who is cash based who did some dry needling. She only had it done a couple of times, and she felt like it did not have a chance for it to help. The chiropractor said she had a lot of scar tissue in her hip.   She states she has a  history of back pain and she felt that she had a flair up of back pain after her hip surgery, which makes sense to her because of the disruption in her gait and slight shortening of the left leg she now has. She feels like the back pain is under control and as good as it can be considering the condition she is in. She had back surgery in 2015. She states a piece of her disk fell off and it was removed by the surgeon but she is unsure of the exact surgery. She had left sided radicular symptoms at that  time, which got better with the surgery. Her current back pain is left sided and over the sacrum. She goes to the chiropractor regularly for her back and feels it is a separate problem from her hip.    PERTINENT HISTORY: Patient is a 67 y.o. 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 This patient's chief complaints consist of L anterior/lateral hip and anterior thigh pain with extended walking and shag dancing, and left groin pain with rotational movements (not as bad) leading to the following functional deficits: difficulty with usual activities such as walking, dancing, exercising, squatting down, moving into figure 4 position, putting on shoes, walking at work. Relevant past medical history and comorbidities include anxiety, arthritis, chronic back pain (lumbar surgery 2015), deviates septum, GERD, history of migraines, scoliosis, SVT (ablation 2022), L THA anterior approach (10/13/2021, Dr. Rosita Kea), allergies, left foot pain, hearing loss, leukocytosis, osteoporosis.  Patient denies hx of cancer, stroke, seizures, lung problems, diabetes, unexplained weight loss, unexplained changes in bowel or bladder problems, paresthesia, and unexplained stumbling or dropping things.    PAIN:  P1 Are you having pain? Yes: NPRS scale: Current: 0/10,  Best: 0/10, Worst: 10/10. Pain location: left anterior and lateral  hip and anterior thigh to knee Pain description: "freezes up into a ball" Aggravating factors: dancing and walking for a certain amount of time, squatting down,  Relieving factors: resting,    P2 Are you having pain? Yes: NPRS scale: Current: 0/10,  Best: 0/10, Worst: 3-4/10. Pain location: left proximal groin, noise Pain description: knocking, brief Aggravating factors: certain movements, twisting, turning Relieving factors: walking normal, avoiding movements that bother her   FUNCTIONAL LIMITATIONS: difficulty with usual activities such as walking, dancing, exercising, squatting down, moving into figure 4 position, putting on shoes, walking at work.   LEISURE: shag dancing  PRECAUTIONS: None  WEIGHT BEARING RESTRICTIONS: No  FALLS:  Has patient fallen in last 6 months? No She is worried about falling. Unsure why she fell when she broke her hip. Concerned it was low blood pressure due to increased med for SVT. Chart said she reported getting her feet tangled in her clothes when she fell.  LIVING ENVIRONMENT: Lives with: boyfriend and a pet Lives in: 2 story home Stairs: one flight to get to top floor with left handrail; 2 steps to enter with no handrails.  Has following equipment at home: Single point cane She has not really needed an AD for the last 3 weeks.   OCCUPATION: administrative assistance, full time  PLOF: Independent  PATIENT GOALS: "to feel better" "magically work"   OBJECTIVE  DIAGNOSTIC FINDINGS:  L hip MRI report from 04/28/2022: CLINICAL DATA:  Persistent left hip pain since replacement last June.   EXAM: MR OF THE LEFT HIP WITHOUT CONTRAST   TECHNIQUE: Multiplanar, multisequence MR imaging was performed. No intravenous contrast was administered.   COMPARISON:  Left hip x-rays dated October 13, 2021.   FINDINGS: Bones: Susceptibility artifact related to prior left total hip arthroplasty. Mild patchy marrow edema in the left greater trochanter  (series 4, image 33). Increased curvilinear T2 marrow signal along the posterolateral aspect of the intertrochanteric stem (series 4, image 37). There is no evidence of acute fracture, dislocation or avascular necrosis. No focal bone lesion. The visualized sacroiliac joints and symphysis pubis appear normal.   Articular cartilage and labrum   Articular cartilage: No focal chondral defect or subchondral signal  abnormality identified in the right hip joint.   Labrum: The right labrum is grossly intact. No paralabral abnormality.   Joint or bursal effusion   Joint effusion: Small left hip joint effusion (series 4, image 25). No right hip joint effusion.   Bursae: No focal periarticular fluid collection.   Muscles and tendons   Muscles and tendons: The visualized gluteus, hamstring and iliopsoas tendons appear normal. No muscle edema or atrophy.   Other findings   Miscellaneous: The visualized internal pelvic contents appear unremarkable.   IMPRESSION: 1. Prior left total hip arthroplasty. Increased curvilinear T2 marrow signal along the posterolateral aspect of the intertrochanteric stem may reflect loosening. 2. Small left hip joint effusion, nonspecific. 3. Mild patchy marrow edema in the left greater trochanter could be stress related. No fracture.     Electronically Signed   By: Obie Dredge M.D.   On: 05/01/2022 09:16  Bone scan for L hip report 07/20/2022:  CLINICAL DATA:  History of left total hip arthroplasty 10/13/2021. Persistent hip pain.   EXAM: NUCLEAR MEDICINE 3-PHASE BONE SCAN   TECHNIQUE: Radionuclide angiographic images, immediate static blood pool images, and 3-hour delayed static images were obtained of the pelvis/hips after intravenous injection of radiopharmaceutical.   RADIOPHARMACEUTICALS:  22.08 mCi Tc-37m MDP   COMPARISON:  MRI 04/28/2022   FINDINGS: Vascular phase: Normal   Blood pool phase: Normal   Delayed phase: Mild  symmetric uptake around the prosthesis, not unexpected. No findings suspicious for loosening or periprosthetic fracture.   The CT images demonstrate well seated femoral and acetabular components without complicating features. The bony pelvis is intact. The pubic symphysis and SI joints are maintained. No significant intrapelvic abnormalities are identified. No inguinal mass or hernia.   IMPRESSION: Unremarkable three-phase bone scan. No findings suspicious for loosening or fracture     Electronically Signed   By: Rudie Meyer M.D.   On: 07/22/2022 11:35   OBSERVATION/INSPECTION Posture Posture (standing): WFL Anthropometrics Tremor: none Body composition: WFL Edema: WFL Functional Mobility Bed mobility: supine <> sit and rolling I Transfers: sit <> stand I Gait: grossly WFL for household and short community ambulation. More detailed gait analysis deferred to later date as needed. No obvious severe gait deviations.    PERIPHERAL JOINT MOTION (in degrees)  PASSIVE RANGE OF MOTION (PROM) *Indicates pain 08/22/22 Date Date  Joint/Motion R/L R/L R/L  Hip     Flexion  140/130 / /  Extension  / / /  Abduction / / /  Adduction / / /  External rotation 48/40 / /  Internal rotation  40/40 / /  Comments:  08/22/2022: B hip abduction and extension WFL for basic mobility. B knee and ankles grossly [facility] for basic mobility. Left hip feels stiff and uncomfortable but not painful at end range.   MUSCLE PERFORMANCE (MMT):  *Indicates pain 08/22/22 Date Date  Joint/Motion R/L R/L R/L  Hip     Flexion (L1, L2) 5/4+* / /  Extension (knee ext) 5/5 / /  Extension (knee flex) / / /  Abduction 4+/3+* / /  Adduction 5/4 / /  External rotation / / /  Internal rotation  / / /  Knee     Extension (L3) 5/5 / /  Flexion (S2) 4/4 / /  Ankle/Foot     Dorsiflexion (L4) 5/5 / /  Great toe extension (L5) 5/5 / /  Eversion (S1) 5/5 / /  Plantarflexion (S1) 5/5 / /  Comments:  08/22/2022:  left  hip flexion concordant pain at anterior/lateral thigh, left cramp in calf with ankle plantar flexion. L abduction lateral hip discomfort.   SPECIAL TESTS: HIP SPECIAL TESTS FADDIR/scour: L = groin pain and "knocking" sensation per patient first rep. THOMAS: L rectus femoris tighter than R with "pulling" sensation ER Derotation test: uncomfortable on left  PALPATION: TTP at left proximal adductor tendons, inguinal line, diffusely over right anterior hip, lateral hip, and posterior glute region. Uncomfortable over iliacus muscle.    TODAY'S TREATMENT:  education  PATIENT EDUCATION:  Education details:  Education on diagnosis, prognosis, POC, anatomy and physiology of current condition. Dry needling Reviewed cancelation/no-show policy with patient and confirmed patient has correct phone number for clinic; patient verbalized understanding (08/22/22). Person educated: Patient Education method: Explanation Education comprehension: verbalized understanding and needs further education  HOME EXERCISE PROGRAM: TBD  ASSESSMENT:  CLINICAL IMPRESSION: Patient is a 67 y.o. 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 Patient has significant weakness with reproduction of lateral and anterior hip pain with resisted hip flexion and hip abduction and reproduction of deep groin pain with palpation to proximal hip adductors and FADDIR test. Patient has strength deficits in L hip flexion, abduction, and adduction. She has some end range stiffness with mild ROM deficits in L hip flexion and ER and demonstrates shortened rectus femoris on L compared to right. 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 She also has some apprehension about use of  left hip that would benefit from LE and functional strengthening and ROM exercises paired with reassurance and education. Patient presents with significant pain, ROM, joint stiffness, muscle tension, knowledge, muscle performance (strength/power/endurance) and activity tolerance  impairments that are limiting ability to complete her usual activities such as walking, dancing, exercising, squatting down, moving into figure 4 position, putting on shoes, walking at work without difficulty. Patient will benefit from skilled physical therapy intervention to address current body structure impairments and activity limitations to improve function and work towards goals set in current POC in order to return to prior level of function or maximal functional improvement.   OBJECTIVE IMPAIRMENTS: decreased activity tolerance, decreased endurance, decreased knowledge of condition, decreased mobility, difficulty walking, decreased ROM, decreased strength, hypomobility, impaired perceived functional ability, increased muscle spasms, impaired flexibility, and pain.   ACTIVITY LIMITATIONS: squatting, walking, dressing, exercising, dancing, and locomotion level.  PARTICIPATION LIMITATIONS: community activity, occupation, and   difficulty with usual activities such as walking, dancing, exercising, squatting down, moving into figure 4 position, putting on shoes, walking at work  PERSONAL FACTORS: Past/current experiences, Time since onset of injury/illness/exacerbation, and 3+ comorbidities:   anxiety, arthritis, chronic back pain (lumbar surgery 2015), deviates septum, GERD, history of migraines, scoliosis, SVT (ablation 2022), L THA anterior approach (10/13/2021, Dr. Rosita Kea), allergies, left foot pain, hearing loss, leukocytosis, osteoporosis are also affecting patient's functional outcome.   REHAB POTENTIAL: Good  CLINICAL DECISION MAKING: Evolving/moderate complexity  EVALUATION COMPLEXITY: Moderate   GOALS: Goals  reviewed with patient? No  SHORT TERM GOALS: Target date: 09/05/2022  Patient will be independent with initial home exercise program for self-management of symptoms. Baseline: Initial HEP to be provided at visit 2 as appropriate (08/22/22); Goal status: INITIAL   LONG TERM GOALS: Target date: 11/14/2022  Patient will be independent with a  long-term home exercise program for self-management of symptoms.  Baseline: Initial HEP to be provided at visit 2 as appropriate (08/22/22); Goal status: INITIAL  2.  Patient will demonstrate improved FOTO b7 equal or greater than 10 points by visit #12 to demonstrate improvement in overall condition and self-reported functional ability.  Baseline: to be measured at visit 2 as appropriate (08/22/22); Goal status: INITIAL  3.  Patient will demonstrate 4+/5 strength in L hip flexion, abduction, and adduction without increase in symptoms to improve her ability to walk and dance without limitation. Baseline: weak and painful - see objective exam (08/22/22); Goal status: INITIAL  4.  Patient will report decrease in pain during functional activities to equal or less than 3/10 to improve her ability to participate in dancing and walking activities.  Baseline: up to 10/10 (08/22/22); Goal status: INITIAL  5.  Patient will complete community, work and/or recreational activities with 75% less limitation due to current condition.  Baseline: difficulty with usual activities such as walking, dancing, exercising, squatting down, moving into figure 4 position, putting on shoes, walking at work (08/22/22); Goal status: INITIAL     PLAN:  PT FREQUENCY: 1-2x/week  PT DURATION: 12 weeks  PLANNED INTERVENTIONS: Therapeutic exercises, Therapeutic activity, Neuromuscular re-education, Balance training, Gait training, Patient/Family education, Self Care, Joint mobilization, Aquatic Therapy, Dry Needling, Electrical stimulation, Spinal mobilization, Cryotherapy, Moist  heat, Manual therapy, and Re-evaluation  PLAN FOR NEXT SESSION: Update HEP as appropriate, progressive hip/LE/functional strengthening, tolerated loading through L hip abductors, adductors, and hip flexors to stimulate tendon remodeling, ROM and stretching as needed to improve stiffness, manual therapy and dry needling as needed, education.    Luretha Murphy. Ilsa Iha, PT, DPT 08/22/22, 8:41 PM  [facility] [address] [place], [place] [address] P: [phone] I F: [phone]

## 2022-08-23 NOTE — Therapy (Unsigned)
OUTPATIENT PHYSICAL THERAPY TREATMENT NOTE   Patient Name: Theresa Buck MRN: [id number] DOB:1956-03-06, 67 y.o., female Today's Date: 08/24/2022  PCP: Dale Dobson, MD   REFERRING PROVIDER: Reinaldo Berber, MD   END OF SESSION:   PT End of Session - 08/24/22 1826     Visit Number 2    Number of Visits 13    Date for PT Re-Evaluation 11/14/22    Authorization Type [employer] reporting period from 08/22/2022    Authorization Time Period VL: 30 PT/OT/Chiro - 24 remain    Authorization - Visit Number 2    Authorization - Number of Visits 24    Progress Note Due on Visit 10    PT Start Time 1825    PT Stop Time 1903    PT Time Calculation (min) 38 min    Activity Tolerance Patient tolerated treatment well    Behavior During Therapy WFL for tasks assessed/performed             Past Medical History:  Diagnosis Date   Allergy    recurring sinus problems   Anxiety    Arthritis    Chronic back pain    Deviated septum    Deviated septum    GERD (gastroesophageal reflux disease)    H/O sinusitis    Headache    H/O MIGRAINES   Hx of degenerative disc disease    Scoliosis    SVT (supraventricular tachycardia)    Past Surgical History:  Procedure Laterality Date   BACK SURGERY  09/26/2013   LUMBAR   COLONOSCOPY  08/03/2015   Dr Mechele Collin   HEMORRHOID SURGERY N/A 03/13/2017   Procedure: HEMORRHOIDECTOMY;  Surgeon: Kieth Brightly, MD;  Location: [facility];  Service: General;  Laterality: N/A;   SVT ABLATION N/A 09/16/2020   Procedure: SVT ABLATION;  Surgeon: Lanier Prude, MD;  Location: [facility];  Service: Cardiovascular;  Laterality: N/A;   TOTAL HIP ARTHROPLASTY Left 10/13/2021   Procedure: TOTAL HIP ARTHROPLASTY ANTERIOR APPROACH;  Surgeon: Kennedy Bucker, MD;  Location: [facility];  Service: Orthopedics;  Laterality: Left;   UPPER GI ENDOSCOPY  08/03/2015   Dr Mechele Collin   Patient Active Problem List   Diagnosis Date Noted    Osteoporosis 06/04/2022   Estrogen deficiency 02/25/2022   Encounter for completion of form with patient 10/29/2021   Leukocytosis 10/28/2021   Femoral neck fracture (HCC) 10/12/2021   LBBB (left bundle branch block) 10/12/2021   Depression 10/12/2021   Pre-op evaluation    Vaginal lesion 06/15/2021   Constipation 06/11/2021   Back pain 12/12/2020   Current use of estrogen therapy 12/12/2020   COVID-19 virus infection 05/02/2020   Hearing loss 06/30/2018   Hemorrhoids 12/10/2016   Fullness of breast 08/11/2015   Epigastric pain 02/14/2015   Pain of left foot 08/16/2014   Health care maintenance 08/16/2014   Other specified symptoms and signs involving the digestive system and abdomen 07/16/2014   Mastodynia 04/06/2013   Acute vaginitis 02/09/2013   Supraventricular tachycardia (HCC) 08/27/2012   Stress 08/27/2012   Other nonmedicinal substance allergy status 04/07/2012   Gastro-esophageal reflux disease without esophagitis 04/07/2012    REFERRING DIAG: 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 This patient's chief complaints consist of L anterior/lateral hip and anterior thigh pain with extended walking and shag dancing, and left groin pain with rotational movements (not as bad) leading to the following functional deficits: difficulty with usual activities such as walking, dancing, exercising, squatting down, moving into figure 4 position, putting on shoes, walking at work. Relevant past medical history and comorbidities include anxiety, arthritis, chronic back pain (lumbar surgery 2015), deviates septum,  GERD, history of migraines, scoliosis, SVT (ablation 2022), L THA anterior approach (10/13/2021, Dr. Rosita Kea), allergies, left foot pain, hearing loss, leukocytosis, osteoporosis.  Patient denies hx of cancer, stroke, seizures, lung problems, diabetes, unexplained weight loss, unexplained changes in bowel or bladder problems, paresthesia, and unexplained stumbling or dropping things.   PRECAUTIONS: osteoporosis  SUBJECTIVE:                                                                                                                                                                                      SUBJECTIVE STATEMENT:  Patient reports she is doing well. Her left hip was diffusely sore after last PT session. Today she is just having pain in her left hip when she first gets up. She states the [place] [employer] bothers her the most when dancing and she does [place].     PAIN:  NPRS: 0/10 at rest.    OBJECTIVE:   Vitals:   08/24/22 1826  BP: 116/77  Pulse: 69  SpO2: 100%   SLS: R = 19 seconds, L = 18 seconds. No large trendelenburg either side.   TODAY'S TREATMENT:  Therapeutic exercise: to centralize symptoms and improve ROM, strength, muscular endurance, and activity tolerance required for successful completion of functional activities.  - vitals measurement for baseline (see above).  - standing left hip flexion with knee extended, U UE support, 3x10 - SLS assessment (see above).  - hip hikes on 2x4 with U UE support, 1x10, 2x15 each side.  - Hooklying bridge to strengthen glutes and back muscles. 1x10 - hooklying single leg bridge, 2x10 each side  - supine hip flexion with band around feet and knee flexed, 3x10 each side.  -  Education on HEP including handout   Manual therapy: to reduce pain and tissue tension, improve range of motion, neuromodulation, in order to promote improved ability to complete functional activities. SIDELYING L side up with pillow between legs - STM to  left glute med region.   Modality: Dry needling performed to left glute medius to decrease pain and spasms along patient's left hip region with patient in left sidelying utilizing  1 dry needle(s) .30mm x 60mm with 3 sticks at left gluteus medius . Patient educated about the risks and benefits from therapy and verbally consents to treatment. Patient reports aching in response to needle but no visible muscle twitch noted.  Dry needling performed by Luretha Murphy. Ilsa Iha PT, DPT who is certified in this technique.  Pt required multimodal cuing for proper technique and to facilitate improved neuromuscular control, strength, range of motion, and functional ability resulting in improved performance and form.    PATIENT EDUCATION:  Education details:  Exercise purpose/form. Self management techniques. Education on HEP including handout.  Dry needling Reviewed cancelation/no-show policy with patient and confirmed patient has correct phone number for clinic; patient verbalized understanding (08/22/22). Person educated: Patient Education method: Explanation Education comprehension: verbalized understanding and needs further education   HOME EXERCISE PROGRAM: Access Code: [email or web address] URL: [email or web address] Date: 08/24/2022 Prepared by: Norton Blizzard  Exercises - Hip Hikes off step  - 1 x daily - 3-5 x weekly - 3 sets - 15 reps - Supine Hip Flexion with Resistance Loop  - 3-5 x weekly - 3 sets - 10 reps - Single Leg Bridge  - 3-5 x weekly - 3 sets - 10 reps   ASSESSMENT:   CLINICAL IMPRESSION: Patient arrives reporting no change in L pain or function. Today's session focused on strengthening/loading exercises for hip flexion, abduction, and extension. Patient tolerated them all well with no increase in pain by end of session, but did feel discomfort in left hip during exercise, especially during L hip abduction and flexion that would be expected given her subjective and objective exam. She  tolerated dry needling well with some achiness in the left glute med where the needles were placed. Plan to progress HEP to include some hip adduction loading next session as appropriate. Patient would benefit from continued management of limiting condition by skilled physical therapist to address remaining impairments and functional limitations to work towards stated goals and return to PLOF or maximal functional independence.   From initial PT Eval 08/22/2022:  Patient is a 67 y.o. 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 Patient has significant weakness with reproduction of lateral and anterior hip pain with resisted hip flexion and hip abduction and reproduction of deep groin pain with palpation to proximal hip adductors and FADDIR test. Patient has strength deficits in L hip flexion, abduction, and adduction. She has some end range stiffness with mild ROM deficits in L hip flexion and ER and demonstrates shortened rectus femoris on L compared to right. 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 She also has some apprehension about use of left hip that would benefit from LE and functional strengthening and ROM exercises paired with reassurance and education. Patient presents with significant pain, ROM, joint stiffness, muscle tension, knowledge, muscle performance (strength/power/endurance) and activity tolerance  impairments that are limiting ability to complete her usual activities such as walking, dancing, exercising, squatting down, moving into figure 4 position, putting on shoes, walking at work without difficulty. Patient will benefit from skilled physical therapy intervention to address current body structure impairments and activity  limitations to improve function and work towards goals set in current POC in order to return to prior level of function or maximal functional improvement.  OBJECTIVE IMPAIRMENTS: decreased activity tolerance, decreased endurance, decreased knowledge of condition, decreased mobility, difficulty walking, decreased ROM, decreased strength, hypomobility, impaired perceived functional ability, increased muscle spasms, impaired flexibility, and pain.    ACTIVITY LIMITATIONS: squatting, walking, dressing, exercising, dancing, and locomotion level.   PARTICIPATION LIMITATIONS: community activity, occupation, and   difficulty with usual activities such as walking, dancing, exercising, squatting down, moving into figure 4 position, putting on shoes, walking at work   PERSONAL FACTORS: Past/current experiences, Time since onset of injury/illness/exacerbation, and 3+ comorbidities:   anxiety, arthritis, chronic back pain (lumbar surgery 2015), deviates septum, GERD, history of migraines, scoliosis, SVT (ablation 2022), L THA anterior approach (10/13/2021, Dr. Rosita Kea), allergies, left foot pain, hearing loss, leukocytosis, osteoporosis are also affecting patient's functional outcome.    REHAB POTENTIAL: Good   CLINICAL DECISION MAKING: Evolving/moderate complexity   EVALUATION COMPLEXITY: Moderate     GOALS: Goals reviewed with patient? No   SHORT TERM GOALS: Target date: 09/05/2022   Patient will be independent with initial home exercise program for self-management of symptoms. Baseline: Initial HEP to be provided at visit 2 as appropriate (08/22/22); Goal status: In-progress     LONG TERM GOALS: Target date: 11/14/2022   Patient will be independent with a long-term home exercise program for self-management of symptoms.  Baseline: Initial HEP to be provided at visit 2 as appropriate (08/22/22); Goal status: In-progress   2.  Patient will demonstrate improved FOTO b7 equal or greater than 10 points  by visit #12 to demonstrate improvement in overall condition and self-reported functional ability.  Baseline: to be measured at visit 2 as appropriate (08/22/22); Goal status: In-progress   3.  Patient will demonstrate 4+/5 strength in L hip flexion, abduction, and adduction without increase in symptoms to improve her ability to walk and dance without limitation. Baseline: weak and painful - see objective exam (08/22/22); Goal status: In-progress   4.  Patient will report decrease in pain during functional activities to equal or less than 3/10 to improve her ability to participate in dancing and walking activities.  Baseline: up to 10/10 (08/22/22); Goal status: In-progress   5.  Patient will complete community, work and/or recreational activities with 75% less limitation due to current condition.  Baseline: difficulty with usual activities such as walking, dancing, exercising, squatting down, moving into figure 4 position, putting on shoes, walking at work (08/22/22); Goal status: In-progress         PLAN:   PT FREQUENCY: 1-2x/week   PT DURATION: 12 weeks   PLANNED INTERVENTIONS: Therapeutic exercises, Therapeutic activity, Neuromuscular re-education, Balance training, Gait training, Patient/Family education, Self Care, Joint mobilization, Aquatic Therapy, Dry Needling, Electrical stimulation, Spinal mobilization, Cryotherapy, Moist heat, Manual therapy, and Re-evaluation   PLAN FOR NEXT SESSION: Update HEP as appropriate, progressive hip/LE/functional strengthening, tolerated loading through L hip abductors, adductors, and hip flexors to stimulate tendon remodeling, ROM and stretching as needed to improve stiffness, manual therapy and dry needling as needed, education.     Cira Rue, PT, DPT 08/24/2022, 7:24 PM  [facility] [address] [place], [place] [address] P: [phone] I F: [phone]

## 2022-08-24 ENCOUNTER — Ambulatory Visit: Payer: BC Managed Care – PPO | Attending: Orthopedic Surgery | Admitting: Physical Therapy

## 2022-08-24 ENCOUNTER — Encounter: Payer: Self-pay | Admitting: Physical Therapy

## 2022-08-24 VITALS — BP 116/77 | HR 69

## 2022-08-24 DIAGNOSIS — M25552 Pain in left hip: Secondary | ICD-10-CM | POA: Diagnosis present

## 2022-08-24 DIAGNOSIS — M6281 Muscle weakness (generalized): Secondary | ICD-10-CM | POA: Diagnosis present

## 2022-08-24 DIAGNOSIS — R262 Difficulty in walking, not elsewhere classified: Secondary | ICD-10-CM | POA: Diagnosis present

## 2022-08-25 ENCOUNTER — Other Ambulatory Visit: Payer: Self-pay | Admitting: Cardiovascular Disease

## 2022-08-25 ENCOUNTER — Other Ambulatory Visit: Payer: Self-pay | Admitting: Internal Medicine

## 2022-08-28 ENCOUNTER — Ambulatory Visit: Payer: BC Managed Care – PPO | Admitting: Physical Therapy

## 2022-08-28 ENCOUNTER — Encounter: Payer: Self-pay | Admitting: Physical Therapy

## 2022-08-28 DIAGNOSIS — R262 Difficulty in walking, not elsewhere classified: Secondary | ICD-10-CM

## 2022-08-28 DIAGNOSIS — M6281 Muscle weakness (generalized): Secondary | ICD-10-CM

## 2022-08-28 DIAGNOSIS — M25552 Pain in left hip: Secondary | ICD-10-CM | POA: Diagnosis not present

## 2022-08-28 NOTE — Telephone Encounter (Signed)
last visit 11/14/2021--follow up appointment in 12 months next visit  none

## 2022-08-28 NOTE — Therapy (Signed)
OUTPATIENT PHYSICAL THERAPY TREATMENT NOTE   Patient Name: Theresa Buck MRN: [id number] DOB:Jan 12, 1956, 67 y.o., female Today's Date: 08/28/2022  PCP: Dale Leland, MD   REFERRING PROVIDER: Reinaldo Berber, MD   END OF SESSION:   PT End of Session - 08/28/22 1908     Visit Number 3    Number of Visits 13    Date for PT Re-Evaluation 11/14/22    Authorization Type [employer] reporting period from 08/22/2022    Authorization Time Period VL: 30 PT/OT/Chiro - 24 remain    Authorization - Visit Number 3    Authorization - Number of Visits 24    Progress Note Due on Visit 10    PT Start Time 1904    PT Stop Time 1944    PT Time Calculation (min) 40 min    Activity Tolerance Patient tolerated treatment well    Behavior During Therapy WFL for tasks assessed/performed              Past Medical History:  Diagnosis Date   Allergy    recurring sinus problems   Anxiety    Arthritis    Chronic back pain    Deviated septum    Deviated septum    GERD (gastroesophageal reflux disease)    H/O sinusitis    Headache    H/O MIGRAINES   Hx of degenerative disc disease    Scoliosis    SVT (supraventricular tachycardia)    Past Surgical History:  Procedure Laterality Date   BACK SURGERY  09/26/2013   LUMBAR   COLONOSCOPY  08/03/2015   Dr Mechele Collin   HEMORRHOID SURGERY N/A 03/13/2017   Procedure: HEMORRHOIDECTOMY;  Surgeon: Kieth Brightly, MD;  Location: [facility];  Service: General;  Laterality: N/A;   SVT ABLATION N/A 09/16/2020   Procedure: SVT ABLATION;  Surgeon: Lanier Prude, MD;  Location: [facility];  Service: Cardiovascular;  Laterality: N/A;   TOTAL HIP ARTHROPLASTY Left 10/13/2021   Procedure: TOTAL HIP ARTHROPLASTY ANTERIOR APPROACH;  Surgeon: Kennedy Bucker, MD;  Location: [facility];  Service: Orthopedics;  Laterality: Left;   UPPER GI ENDOSCOPY  08/03/2015   Dr Mechele Collin   Patient Active Problem List   Diagnosis Date Noted    Osteoporosis 06/04/2022   Estrogen deficiency 02/25/2022   Encounter for completion of form with patient 10/29/2021   Leukocytosis 10/28/2021   Femoral neck fracture (HCC) 10/12/2021   LBBB (left bundle branch block) 10/12/2021   Depression 10/12/2021   Pre-op evaluation    Vaginal lesion 06/15/2021   Constipation 06/11/2021   Back pain 12/12/2020   Current use of estrogen therapy 12/12/2020   COVID-19 virus infection 05/02/2020   Hearing loss 06/30/2018   Hemorrhoids 12/10/2016   Fullness of breast 08/11/2015   Epigastric pain 02/14/2015   Pain of left foot 08/16/2014   Health care maintenance 08/16/2014   Other specified symptoms and signs involving the digestive system and abdomen 07/16/2014   Mastodynia 04/06/2013   Acute vaginitis 02/09/2013   Supraventricular tachycardia (HCC) 08/27/2012   Stress 08/27/2012   Other nonmedicinal substance allergy status 04/07/2012   Gastro-esophageal reflux disease without esophagitis 04/07/2012    REFERRING DIAG: 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 This patient's chief complaints consist of L anterior/lateral hip and anterior thigh pain with extended walking and shag dancing, and left groin pain with rotational movements (not as bad) leading to the following functional deficits: difficulty with usual activities such as walking, dancing, exercising, squatting down, moving into figure 4 position, putting on shoes, walking at work. Relevant past medical history and comorbidities include anxiety, arthritis, chronic back pain (lumbar surgery 2015), deviates septum,  GERD, history of migraines, scoliosis, SVT (ablation 2022), L THA anterior approach (10/13/2021, Dr. Rosita Kea), allergies, left foot pain, hearing loss, leukocytosis, osteoporosis.  Patient denies hx of cancer, stroke, seizures, lung problems, diabetes, unexplained weight loss, unexplained changes in bowel or bladder problems, paresthesia, and unexplained stumbling or dropping things.   PRECAUTIONS: osteoporosis  SUBJECTIVE:                                                                                                                                                                                      SUBJECTIVE STATEMENT:  Patient reports she is doing well. She states she was sore after last PT session. She went dancing on Friday a little that caused her the usual difficulty. She states her HEP is going well.    PAIN:  NPRS: 0/10 at rest.    OBJECTIVE:    TODAY'S TREATMENT:  Therapeutic exercise: to centralize symptoms and improve ROM, strength, muscular endurance, and activity tolerance required for successful completion of functional activities.  - NuStep level 1 using bilateral upper and lower extremities. Seat/handle setting 9/11. For improved extremity mobility, muscular endurance, and activity tolerance; and to induce the analgesic effect of aerobic exercise, stimulate improved joint nutrition, and prepare body structures and systems for following interventions. x 5  minutes. Average SPM = 60. Target to keep SPM over 60.   Circuit:  - lateral stepping back and forth over small 8-inch aerobic step, 3x10 each direction with 3#AW on each foot.  - staggered stance sit to stand with short sit on 18 inch chair, L LE back, 3x10   - hip hikes on 2x4 with U UE support, 3x10/15/15 each side with 3# AW.   - standing hip adduction with YTB loop around ankle and anchored on chair, 1x15 each side - standing lateral foot slide on slider, and adduction back, B UE support as needed, 1x10 each foot  sliding.  - lateral stepping with YTB loop around ankle and anchored to side of body pulling towards abduction, 1x10 each leg each direction for approx 5 feet.  - standing AROM B hip ER with feet on  turnout discs, 1x10 - standing B hip ER with feet on turnout discs with YTB looped around forefeet, 3x10.  -  Education on HEP including handout   Pt required multimodal cuing for proper technique and to facilitate improved neuromuscular control, strength, range of motion, and functional ability resulting in improved performance and form.    PATIENT EDUCATION:  Education details:  Exercise purpose/form. Self management techniques. Education on HEP including handout.  Dry needling Reviewed cancelation/no-show policy with patient and confirmed patient has correct phone number for clinic; patient verbalized understanding (08/22/22). Person educated: Patient Education method: Explanation Education comprehension: verbalized understanding and needs further education   HOME EXERCISE PROGRAM: Access Code: [email or web address] URL: [email or web address] Date: 08/28/2022 Prepared by: Norton Blizzard  Exercises - Hip Hikes off step  - 1 x daily - 3-5 x weekly - 3 sets - 15 reps - Supine Hip Flexion with Resistance Loop  - 3-5 x weekly - 3 sets - 10 reps - Single Leg Bridge  - 3-5 x weekly - 3 sets - 10 reps - Staggered Sit-to-Stand  - 1 x daily - 3 sets - 10 reps   ASSESSMENT:   CLINICAL IMPRESSION: Patient arrives reporting good tolerance to last PT session and HEP but no change in function or pain limitations. Today's session focused on strengthening and activity tolerance exercises targeting the muscles of the left hip. She had mild to moderate symptoms during exercises but improved with multiple sets and felt no worse by end of session. Plan to further explore single leg proprioceptive exercises next session. Patient would benefit from continued management of limiting condition by skilled physical  therapist to address remaining impairments and functional limitations to work towards stated goals and return to PLOF or maximal functional independence.   From initial PT Eval 08/22/2022:  Patient is a 67 y.o. 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 Patient has significant weakness with reproduction of lateral and anterior hip pain with resisted hip flexion and hip abduction and reproduction of deep groin pain with palpation to proximal hip adductors and FADDIR test. Patient has strength deficits in L hip flexion, abduction, and adduction. She has some end range stiffness with mild ROM deficits in L hip flexion and ER and demonstrates shortened rectus femoris on L compared to right. 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 She also has some apprehension about use of left hip that would benefit from LE and functional strengthening and ROM exercises paired with reassurance and education. Patient presents with significant pain, ROM, joint stiffness, muscle tension, knowledge, muscle performance (strength/power/endurance) and activity tolerance  impairments that are limiting ability to complete her usual activities such as walking, dancing, exercising, squatting down, moving into figure 4 position, putting on shoes, walking at work without difficulty. Patient will benefit from skilled physical therapy intervention to address current body structure impairments and activity limitations to improve function and work towards goals set in current POC in order to return to prior level of function or maximal functional improvement.    OBJECTIVE IMPAIRMENTS: decreased activity tolerance, decreased endurance, decreased knowledge of condition, decreased  mobility, difficulty walking, decreased ROM, decreased strength, hypomobility, impaired perceived functional ability, increased muscle spasms, impaired flexibility, and pain.    ACTIVITY LIMITATIONS: squatting, walking, dressing, exercising, dancing, and  locomotion level.   PARTICIPATION LIMITATIONS: community activity, occupation, and   difficulty with usual activities such as walking, dancing, exercising, squatting down, moving into figure 4 position, putting on shoes, walking at work   PERSONAL FACTORS: Past/current experiences, Time since onset of injury/illness/exacerbation, and 3+ comorbidities:   anxiety, arthritis, chronic back pain (lumbar surgery 2015), deviates septum, GERD, history of migraines, scoliosis, SVT (ablation 2022), L THA anterior approach (10/13/2021, Dr. Rosita Kea), allergies, left foot pain, hearing loss, leukocytosis, osteoporosis are also affecting patient's functional outcome.    REHAB POTENTIAL: Good   CLINICAL DECISION MAKING: Evolving/moderate complexity   EVALUATION COMPLEXITY: Moderate     GOALS: Goals reviewed with patient? No   SHORT TERM GOALS: Target date: 09/05/2022   Patient will be independent with initial home exercise program for self-management of symptoms. Baseline: Initial HEP to be provided at visit 2 as appropriate (08/22/22); Goal status: In-progress     LONG TERM GOALS: Target date: 11/14/2022   Patient will be independent with a long-term home exercise program for self-management of symptoms.  Baseline: Initial HEP to be provided at visit 2 as appropriate (08/22/22); Goal status: In-progress   2.  Patient will demonstrate improved FOTO b7 equal or greater than 10 points by visit #12 to demonstrate improvement in overall condition and self-reported functional ability.  Baseline: to be measured at visit 2 as appropriate (08/22/22); Goal status: In-progress   3.  Patient will demonstrate 4+/5 strength in L hip flexion, abduction, and  adduction without increase in symptoms to improve her ability to walk and dance without limitation. Baseline: weak and painful - see objective exam (08/22/22); Goal status: In-progress   4.  Patient will report decrease in pain during functional activities to equal or less than 3/10 to improve her ability to participate in dancing and walking activities.  Baseline: up to 10/10 (08/22/22); Goal status: In-progress   5.  Patient will complete community, work and/or recreational activities with 75% less limitation due to current condition.  Baseline: difficulty with usual activities such as walking, dancing, exercising, squatting down, moving into figure 4 position, putting on shoes, walking at work (08/22/22); Goal status: In-progress         PLAN:   PT FREQUENCY: 1-2x/week   PT DURATION: 12 weeks   PLANNED INTERVENTIONS: Therapeutic exercises, Therapeutic activity, Neuromuscular re-education, Balance training, Gait training, Patient/Family education, Self Care, Joint mobilization, Aquatic Therapy, Dry Needling, Electrical stimulation, Spinal mobilization, Cryotherapy, Moist heat, Manual therapy, and Re-evaluation   PLAN FOR NEXT SESSION: Update HEP as appropriate, progressive hip/LE/functional strengthening, tolerated loading through L hip abductors, adductors, and hip flexors to stimulate tendon remodeling, ROM and stretching as needed to improve stiffness, manual therapy and dry needling as needed, education.     Cira Rue, PT, DPT 08/28/2022, 8:02 PM  [facility] [address] [place], [place] [address] P: [phone] I F: [phone]

## 2022-08-30 ENCOUNTER — Encounter: Payer: Self-pay | Admitting: Physical Therapy

## 2022-08-30 ENCOUNTER — Ambulatory Visit: Payer: BC Managed Care – PPO | Admitting: Physical Therapy

## 2022-08-30 DIAGNOSIS — M6281 Muscle weakness (generalized): Secondary | ICD-10-CM

## 2022-08-30 DIAGNOSIS — R262 Difficulty in walking, not elsewhere classified: Secondary | ICD-10-CM

## 2022-08-30 DIAGNOSIS — M25552 Pain in left hip: Secondary | ICD-10-CM

## 2022-08-30 NOTE — Therapy (Signed)
OUTPATIENT PHYSICAL THERAPY TREATMENT NOTE   Patient Name: Theresa Buck MRN: [id number] DOB:05-28-1955, 67 y.o., female Today's Date: 08/30/2022  PCP: Dale Frohna, MD   REFERRING PROVIDER: Reinaldo Berber, MD   END OF SESSION:   PT End of Session - 08/30/22 1904     Visit Number 4    Number of Visits 13    Date for PT Re-Evaluation 11/14/22    Authorization Type [employer] reporting period from 08/22/2022    Authorization Time Period VL: 30 PT/OT/Chiro - 24 remain    Authorization - Visit Number 4    Authorization - Number of Visits 24    Progress Note Due on Visit 10    PT Start Time 1904    PT Stop Time 1942    PT Time Calculation (min) 38 min    Activity Tolerance Patient tolerated treatment well    Behavior During Therapy WFL for tasks assessed/performed               Past Medical History:  Diagnosis Date   Allergy    recurring sinus problems   Anxiety    Arthritis    Chronic back pain    Deviated septum    Deviated septum    GERD (gastroesophageal reflux disease)    H/O sinusitis    Headache    H/O MIGRAINES   Hx of degenerative disc disease    Scoliosis    SVT (supraventricular tachycardia)    Past Surgical History:  Procedure Laterality Date   BACK SURGERY  09/26/2013   LUMBAR   COLONOSCOPY  08/03/2015   Dr Mechele Collin   HEMORRHOID SURGERY N/A 03/13/2017   Procedure: HEMORRHOIDECTOMY;  Surgeon: Kieth Brightly, MD;  Location: [facility];  Service: General;  Laterality: N/A;   SVT ABLATION N/A 09/16/2020   Procedure: SVT ABLATION;  Surgeon: Lanier Prude, MD;  Location: [facility];  Service: Cardiovascular;  Laterality: N/A;   TOTAL HIP ARTHROPLASTY Left 10/13/2021   Procedure: TOTAL HIP ARTHROPLASTY ANTERIOR APPROACH;  Surgeon: Kennedy Bucker, MD;  Location: [facility];  Service: Orthopedics;  Laterality: Left;   UPPER GI ENDOSCOPY  08/03/2015   Dr Mechele Collin   Patient Active Problem List   Diagnosis Date Noted    Osteoporosis 06/04/2022   Estrogen deficiency 02/25/2022   Encounter for completion of form with patient 10/29/2021   Leukocytosis 10/28/2021   Femoral neck fracture (HCC) 10/12/2021   LBBB (left bundle branch block) 10/12/2021   Depression 10/12/2021   Pre-op evaluation    Vaginal lesion 06/15/2021   Constipation 06/11/2021   Back pain 12/12/2020   Current use of estrogen therapy 12/12/2020   COVID-19 virus infection 05/02/2020   Hearing loss 06/30/2018   Hemorrhoids 12/10/2016   Fullness of breast 08/11/2015   Epigastric pain 02/14/2015   Pain of left foot 08/16/2014   Health care maintenance 08/16/2014   Other specified symptoms and signs involving the digestive system and abdomen 07/16/2014   Mastodynia 04/06/2013   Acute vaginitis 02/09/2013   Supraventricular tachycardia (HCC) 08/27/2012   Stress 08/27/2012   Other nonmedicinal substance allergy status 04/07/2012   Gastro-esophageal reflux disease without esophagitis 04/07/2012    REFERRING DIAG: 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 This patient's chief complaints consist of L anterior/lateral hip and anterior thigh pain with extended walking and shag dancing, and left groin pain with rotational movements (not as bad) leading to the following functional deficits: difficulty with usual activities such as walking, dancing, exercising, squatting down, moving into figure 4 position, putting on shoes, walking at work. Relevant past medical history and comorbidities include anxiety, arthritis, chronic back pain (lumbar surgery 2015), deviates septum,  GERD, history of migraines, scoliosis, SVT (ablation 2022), L THA anterior approach (10/13/2021, Dr. Rosita Kea), allergies, left foot pain, hearing loss, leukocytosis, osteoporosis.  Patient denies hx of cancer, stroke, seizures, lung problems, diabetes, unexplained weight loss, unexplained changes in bowel or bladder problems, paresthesia, and unexplained stumbling or dropping things.   PRECAUTIONS: osteoporosis  SUBJECTIVE:                                                                                                                                                                                      SUBJECTIVE STATEMENT:  Patient reports she is feeling well. She states she did her HEP last night. She was okay after last PT session.    PAIN:  NPRS: 0/10 at rest.    OBJECTIVE:    TODAY'S TREATMENT:  Therapeutic exercise: to centralize symptoms and improve ROM, strength, muscular endurance, and activity tolerance required for successful completion of functional activities.  - NuStep level 3 using bilateral upper and lower extremities. Seat/handle setting 9/11. For improved extremity mobility, muscular endurance, and activity tolerance; and to induce the analgesic effect of aerobic exercise, stimulate improved joint nutrition, and prepare body structures and systems for following interventions. x 5  minutes. Average SPM = 74. Target to keep SPM over 60.   Circuit:  - lateral stepping back and forth over small 8-inch aerobic step, 3x10 each direction with 4#AW on each foot.  - staggered squat to buttocks tap on 18 inch chair with 2#DB held at shoulders, L LE back, 3x10   - hip hikes on 2x4 with U UE support, 3x15 each side with 4# AW.   - single leg stance ball toss, 3x20 each foot.  - standing jumping jacks, 3x10 with B UE support.  - standing left hip flexion repeater with R foot on 4 inch step and UE support, 3x10. (Fatiguing by the end).  - lateral lunge with UE support, 1x10 each side.    Pt required multimodal cuing for proper technique and to facilitate improved neuromuscular control, strength, range of motion, and functional ability resulting in improved performance and form.    PATIENT  EDUCATION:  Education details:  Exercise purpose/form. Self management techniques. Education on HEP including handout.  Dry needling Reviewed cancelation/no-show policy with patient and confirmed patient has correct phone number for clinic; patient verbalized understanding (08/22/22). Person educated: Patient Education method: Explanation Education comprehension: verbalized understanding and needs further education   HOME EXERCISE PROGRAM: Access Code: [email or web address] URL: [email or web address] Date: 08/28/2022 Prepared by: Norton Blizzard  Exercises - Hip Hikes off step  - 1 x daily - 3-5 x weekly - 3 sets - 15 reps - Supine Hip Flexion with Resistance Loop  - 3-5 x weekly - 3 sets - 10 reps - Single Leg Bridge  - 3-5 x weekly - 3 sets - 10 reps - Staggered Sit-to-Stand  - 1 x daily - 3 sets - 10 reps   ASSESSMENT:   CLINICAL IMPRESSION: Patient arrives reporting good tolerance to last PT session and HEP last night. Continued with progressive strengthen targeting the hips and functional exercises. Patient reported some discomfort with exercises but no lasting pain. Patient would benefit from continued management of limiting condition by skilled physical therapist to address remaining impairments and functional limitations to work towards stated goals and return to PLOF or maximal functional independence.   From initial PT Eval 08/22/2022:  Patient is a 67 y.o. 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 Patient has significant weakness with reproduction of lateral and anterior hip  pain with resisted hip flexion and hip abduction and reproduction of deep groin pain with palpation to proximal hip adductors and FADDIR test. Patient has strength deficits in L hip flexion, abduction, and adduction. She has some end range stiffness with mild ROM deficits in L hip flexion and ER and demonstrates shortened rectus femoris on L compared to right. 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 She also has some apprehension about use of left hip that would benefit from LE and functional strengthening and ROM exercises paired with reassurance and education. Patient presents with significant pain, ROM, joint stiffness, muscle tension, knowledge, muscle performance (strength/power/endurance) and activity tolerance  impairments that are limiting ability to complete her usual activities such as walking, dancing, exercising, squatting down, moving into figure 4 position, putting on shoes, walking at work without difficulty. Patient will benefit from skilled physical therapy intervention to address current body structure impairments and activity limitations to improve function and work towards goals set in current POC in order to return to prior level of function or maximal functional improvement.    OBJECTIVE IMPAIRMENTS: decreased activity tolerance, decreased endurance, decreased knowledge of condition, decreased mobility, difficulty walking, decreased ROM, decreased strength, hypomobility, impaired perceived functional ability, increased muscle spasms, impaired flexibility, and pain.    ACTIVITY LIMITATIONS: squatting, walking, dressing, exercising, dancing, and locomotion level.   PARTICIPATION LIMITATIONS: community activity, occupation, and   difficulty with usual activities such as walking, dancing, exercising, squatting down, moving into figure 4 position, putting on shoes, walking at work   PERSONAL FACTORS: Past/current  experiences, Time since onset of injury/illness/exacerbation, and 3+ comorbidities:   anxiety, arthritis, chronic back pain (lumbar surgery 2015), deviates septum, GERD, history of migraines, scoliosis, SVT (ablation 2022), L THA anterior approach (10/13/2021, Dr. Rosita Kea), allergies, left foot pain, hearing loss, leukocytosis, osteoporosis are also affecting patient's functional outcome.  REHAB POTENTIAL: Good   CLINICAL DECISION MAKING: Evolving/moderate complexity   EVALUATION COMPLEXITY: Moderate     GOALS: Goals reviewed with patient? No   SHORT TERM GOALS: Target date: 09/05/2022   Patient will be independent with initial home exercise program for self-management of symptoms. Baseline: Initial HEP to be provided at visit 2 as appropriate (08/22/22); Goal status: In-progress     LONG TERM GOALS: Target date: 11/14/2022   Patient will be independent with a long-term home exercise program for self-management of symptoms.  Baseline: Initial HEP to be provided at visit 2 as appropriate (08/22/22); Goal status: In-progress   2.  Patient will demonstrate improved FOTO b7 equal or greater than 10 points by visit #12 to demonstrate improvement in overall condition and self-reported functional ability.  Baseline: to be measured at visit 2 as appropriate (08/22/22); Goal status: In-progress   3.  Patient will demonstrate 4+/5 strength in L hip flexion, abduction, and adduction without increase in symptoms to improve her ability to walk and dance without limitation. Baseline: weak and painful - see objective exam (08/22/22); Goal status: In-progress   4.  Patient will report decrease in pain during functional activities to equal or less than 3/10 to improve her ability to participate in dancing and walking activities.  Baseline: up to 10/10 (08/22/22); Goal status: In-progress   5.  Patient will complete community, work and/or recreational activities with 75% less limitation due to current  condition.  Baseline: difficulty with usual activities such as walking, dancing, exercising, squatting down, moving into figure 4 position, putting on shoes, walking at work (08/22/22); Goal status: In-progress         PLAN:   PT FREQUENCY: 1-2x/week   PT DURATION: 12 weeks   PLANNED INTERVENTIONS: Therapeutic exercises, Therapeutic activity, Neuromuscular re-education, Balance training, Gait training, Patient/Family education, Self Care, Joint mobilization, Aquatic Therapy, Dry Needling, Electrical stimulation, Spinal mobilization, Cryotherapy, Moist heat, Manual therapy, and Re-evaluation   PLAN FOR NEXT SESSION: Update HEP as appropriate, progressive hip/LE/functional strengthening, tolerated loading through L hip abductors, adductors, and hip flexors to stimulate tendon remodeling, ROM and stretching as needed to improve stiffness, manual therapy and dry needling as needed, education.     Cira Rue, PT, DPT 08/30/2022, 8:02 PM  [facility] [address] [place], [place] [address] P: [phone] I F: [phone]

## 2022-08-31 ENCOUNTER — Telehealth: Payer: Self-pay

## 2022-08-31 NOTE — Telephone Encounter (Signed)
APPT MOVED TO 7 AM

## 2022-08-31 NOTE — Telephone Encounter (Signed)
Spoke with patient about appt tomorrow. She is suppoosed to be calling me back. Please transfer call to me so I can discuss.

## 2022-08-31 NOTE — Telephone Encounter (Signed)
Pt returning call

## 2022-09-01 ENCOUNTER — Ambulatory Visit (INDEPENDENT_AMBULATORY_CARE_PROVIDER_SITE_OTHER): Payer: BC Managed Care – PPO | Admitting: Internal Medicine

## 2022-09-01 ENCOUNTER — Encounter: Payer: Self-pay | Admitting: Internal Medicine

## 2022-09-01 ENCOUNTER — Other Ambulatory Visit (HOSPITAL_COMMUNITY)
Admission: RE | Admit: 2022-09-01 | Discharge: 2022-09-01 | Disposition: A | Discharge status: Home / Self Care | Payer: BC Managed Care – PPO | Source: Ambulatory Visit | Attending: Internal Medicine | Admitting: Internal Medicine

## 2022-09-01 VITALS — BP 112/68 | HR 72 | Temp 97.5°F | Ht 66.0 in | Wt 144.6 lb

## 2022-09-01 DIAGNOSIS — Z124 Encounter for screening for malignant neoplasm of cervix: Secondary | ICD-10-CM | POA: Diagnosis present

## 2022-09-01 DIAGNOSIS — Z13220 Encounter for screening for lipoid disorders: Secondary | ICD-10-CM | POA: Diagnosis not present

## 2022-09-01 DIAGNOSIS — Z79899 Other long term (current) drug therapy: Secondary | ICD-10-CM

## 2022-09-01 DIAGNOSIS — D649 Anemia, unspecified: Secondary | ICD-10-CM

## 2022-09-01 DIAGNOSIS — F439 Reaction to severe stress, unspecified: Secondary | ICD-10-CM

## 2022-09-01 DIAGNOSIS — Z0000 Encounter for general adult medical examination without abnormal findings: Secondary | ICD-10-CM | POA: Diagnosis not present

## 2022-09-01 DIAGNOSIS — I471 Supraventricular tachycardia, unspecified: Secondary | ICD-10-CM | POA: Diagnosis not present

## 2022-09-01 DIAGNOSIS — M810 Age-related osteoporosis without current pathological fracture: Secondary | ICD-10-CM

## 2022-09-01 DIAGNOSIS — F329 Major depressive disorder, single episode, unspecified: Secondary | ICD-10-CM

## 2022-09-01 DIAGNOSIS — K219 Gastro-esophageal reflux disease without esophagitis: Secondary | ICD-10-CM

## 2022-09-01 LAB — CBC WITH DIFFERENTIAL/PLATELET
Basophils Absolute: 0.1 10*3/uL (ref 0.0–0.1)
Basophils Relative: 1 % (ref 0.0–3.0)
Eosinophils Absolute: 0.3 10*3/uL (ref 0.0–0.7)
Eosinophils Relative: 5.8 % — ABNORMAL HIGH (ref 0.0–5.0)
HCT: 43.2 % (ref 36.0–46.0)
Hemoglobin: 14.5 g/dL (ref 12.0–15.0)
Lymphocytes Relative: 27.8 % (ref 12.0–46.0)
Lymphs Abs: 1.6 10*3/uL (ref 0.7–4.0)
MCHC: 33.5 g/dL (ref 30.0–36.0)
MCV: 96.8 fl (ref 78.0–100.0)
Monocytes Absolute: 0.5 10*3/uL (ref 0.1–1.0)
Monocytes Relative: 8.7 % (ref 3.0–12.0)
Neutro Abs: 3.2 10*3/uL (ref 1.4–7.7)
Neutrophils Relative %: 56.7 % (ref 43.0–77.0)
Platelets: 219 10*3/uL (ref 150.0–400.0)
RBC: 4.46 Mil/uL (ref 3.87–5.11)
RDW: 12.4 % (ref 11.5–15.5)
WBC: 5.7 10*3/uL (ref 4.0–10.5)

## 2022-09-01 LAB — BASIC METABOLIC PANEL
BUN: 14 mg/dL (ref 6–23)
CO2: 29 mEq/L (ref 19–32)
Calcium: 9.2 mg/dL (ref 8.4–10.5)
Chloride: 104 mEq/L (ref 96–112)
Creatinine, Ser: 0.75 mg/dL (ref 0.40–1.20)
GFR: 83.45 mL/min (ref >60.00)
Glucose, Bld: 93 mg/dL (ref 70–99)
Potassium: 4.1 mEq/L (ref 3.5–5.1)
Sodium: 140 mEq/L (ref 135–145)

## 2022-09-01 LAB — HEPATIC FUNCTION PANEL
ALT: 12 U/L (ref 0–35)
AST: 15 U/L (ref 0–37)
Albumin: 4.2 g/dL (ref 3.5–5.2)
Alkaline Phosphatase: 64 U/L (ref 39–117)
Bilirubin, Direct: 0.1 mg/dL (ref 0.0–0.3)
Total Bilirubin: 0.6 mg/dL (ref 0.2–1.2)
Total Protein: 6.7 g/dL (ref 6.0–8.3)

## 2022-09-01 LAB — TSH: TSH: 1.57 u[IU]/mL (ref 0.35–5.50)

## 2022-09-01 LAB — LIPID PANEL
Cholesterol: 187 mg/dL (ref 0–200)
HDL: 53.6 mg/dL (ref >39.00)
LDL Cholesterol: 119 mg/dL — ABNORMAL HIGH (ref 0–99)
NonHDL: 133.4
Total CHOL/HDL Ratio: 3
Triglycerides: 74 mg/dL (ref 0.0–149.0)
VLDL: 14.8 mg/dL (ref 0.0–40.0)

## 2022-09-01 MED ORDER — PANTOPRAZOLE SODIUM 20 MG PO TBEC: 20.0000 mg | DELAYED_RELEASE_TABLET | Freq: Every day | ORAL | 1 refills | Status: DC | PRN

## 2022-09-01 NOTE — Assessment & Plan Note (Signed)
Physical today 09/01/22.  Mammogram 08/15/22 - Birads I. Colonoscopy 07/2015.  PAP 06/10/21 - ok.  Repeat pap today.

## 2022-09-01 NOTE — Progress Notes (Unsigned)
Subjective:    Patient ID: Theresa Buck, female    DOB: [birth year], 67 y.o.   MRN: [id number]  Patient here for  Chief Complaint  Patient presents with   Annual Exam    HPI Here for physical exam. Is s/p left total hip replacement 10/13/21. Has been seeing PT due to persistent issues.  Is doing some better.  Doing exercises.  No chest pain or sob reported.  No cough or congestion.  No abdominal pain or bowel change reported.  Bowels stable. On metoprolol for SVT.  Stable.  Had questions about HRT.  Discussed trying to slowly taper.  Also discussed acid reflux.  Had symptoms if tries to stop protonix.  Discussed decreasing protonix to 20mg  - will alternate doses first.    Past Medical History:  Diagnosis Date   Allergy    recurring sinus problems   Anxiety    Arthritis    Chronic back pain    Deviated septum    Deviated septum    GERD (gastroesophageal reflux disease)    H/O sinusitis    Headache    H/O MIGRAINES   Hx of degenerative disc disease    Scoliosis    SVT (supraventricular tachycardia)    Past Surgical History:  Procedure Laterality Date   BACK SURGERY  09/26/2013   LUMBAR   COLONOSCOPY  08/03/2015   Dr Mechele Collin   HEMORRHOID SURGERY N/A 03/13/2017   Procedure: HEMORRHOIDECTOMY;  Surgeon: Kieth Brightly, MD;  Location: [facility];  Service: General;  Laterality: N/A;   SVT ABLATION N/A 09/16/2020   Procedure: SVT ABLATION;  Surgeon: Lanier Prude, MD;  Location: [facility];  Service: Cardiovascular;  Laterality: N/A;   TOTAL HIP ARTHROPLASTY Left 10/13/2021   Procedure: TOTAL HIP ARTHROPLASTY ANTERIOR APPROACH;  Surgeon: Kennedy Bucker, MD;  Location: [facility];  Service: Orthopedics;  Laterality: Left;   UPPER GI ENDOSCOPY  08/03/2015   Dr Mechele Collin   Family History  Problem Relation Age of Onset   Brain cancer Mother    ALS Father    Diabetes Brother        multiple   Heart disease Son        myocardial infarction, s/p CABG   Throat  cancer Brother    Breast cancer Neg Hx    Social History   Socioeconomic History   Marital status: Widowed    Spouse name: Not on file   Number of children: 3   Years of education: Not on file   Highest education level: Not on file  Occupational History   Not on file  Tobacco Use   Smoking status: Never   Smokeless tobacco: Never  Vaping Use   Vaping Use: Never used  Substance and Sexual Activity   Alcohol use: No    Alcohol/week: 0.0 standard drinks of alcohol   Drug use: No   Sexual activity: Not on file  Other Topics Concern   Not on file  Social History Narrative   Not on file   Social Determinants of Health   Financial Resource Strain: Not on file  Food Insecurity: Not on file  Transportation Needs: Not on file  Physical Activity: Not on file  Stress: Not on file  Social Connections: Not on file     Review of Systems  Constitutional:  Negative for appetite change and unexpected weight change.  HENT:  Negative for congestion, sinus pressure and sore throat.   Eyes:  Negative for pain and  visual disturbance.  Respiratory:  Negative for cough, chest tightness and shortness of breath.   Cardiovascular:  Negative for chest pain, palpitations and leg swelling.  Gastrointestinal:  Negative for abdominal pain, diarrhea, nausea and vomiting.  Genitourinary:  Negative for difficulty urinating, dysuria and frequency.  Musculoskeletal:  Negative for back pain and joint swelling.  Skin:  Negative for color change and rash.  Neurological:  Negative for dizziness and headaches.  Hematological:  Negative for adenopathy. Does not bruise/bleed easily.  Psychiatric/Behavioral:  Negative for decreased concentration and dysphoric mood.        Objective:     BP 112/68   Pulse 72   Temp (!) 97.5 F (36.4 C)   Ht 5\' 6"  (1.676 m)   Wt 144 lb 9.6 oz (65.6 kg)   LMP 07/15/2012   SpO2 98%   BMI 23.34 kg/m  Wt Readings from Last 3 Encounters:  09/01/22 144 lb 9.6 oz (65.6  kg)  06/02/22 141 lb (64 kg)  02/24/22 143 lb 6.4 oz (65 kg)    Physical Exam Vitals reviewed.  Constitutional:      General: She is not in acute distress.    Appearance: Normal appearance. She is well-developed.  HENT:     Head: Normocephalic and atraumatic.     Right Ear: External ear normal.     Left Ear: External ear normal.  Eyes:     General: No scleral icterus.       Right eye: No discharge.        Left eye: No discharge.     Conjunctiva/sclera: Conjunctivae normal.  Neck:     Thyroid: No thyromegaly.  Cardiovascular:     Rate and Rhythm: Normal rate and regular rhythm.  Pulmonary:     Effort: No tachypnea, accessory muscle usage or respiratory distress.     Breath sounds: Normal breath sounds. No decreased breath sounds or wheezing.  Chest:  Breasts:    Right: No inverted nipple, mass, nipple discharge or tenderness (no axillary adenopathy).     Left: No inverted nipple, mass, nipple discharge or tenderness (no axilarry adenopathy).  Abdominal:     General: Bowel sounds are normal.     Palpations: Abdomen is soft.     Tenderness: There is no abdominal tenderness.  Genitourinary:    Comments: Normal external genitalia.  Vaginal vault without lesions.  Cervix identified.  Pap smear performed.  Could not appreciate any adnexal masses or tenderness.   Musculoskeletal:        General: No swelling or tenderness.     Cervical back: Neck supple.  Lymphadenopathy:     Cervical: No cervical adenopathy.  Skin:    Findings: No erythema or rash.  Neurological:     Mental Status: She is alert and oriented to person, place, and time.  Psychiatric:        Mood and Affect: Mood normal.        Behavior: Behavior normal.      Outpatient Encounter Medications as of 09/01/2022  Medication Sig   Biotin 5000 MCG TABS Take 5,000 mcg by mouth daily.   Calcium Carb-Cholecalciferol (CALCIUM + D3 PO) Take 1 tablet daily by mouth.   carboxymethylcellulose (REFRESH PLUS) 0.5 % SOLN  Place 1 drop into both eyes 3 (three) times daily as needed (dry eyes).   clobetasol cream (TEMOVATE) 0.05 % Apply to bumps on right side twice daily until improved. Avoid face, groin, axilla.   estradiol (ESTRACE) 0.1 MG/GM vaginal cream INSERT  1 APPLICATORFUL VAGINALLY DAILY   estradiol (ESTRACE) 1 MG tablet TAKE 1/2 TABLET(0.5 MG) BY MOUTH DAILY   hydrocortisone (ANUSOL-HC) 25 MG suppository Place 1 suppository (25 mg total) rectally 2 (two) times daily.   metoprolol succinate (TOPROL-XL) 25 MG 24 hr tablet TAKE 1 TABLET(25 MG) BY MOUTH DAILY WITH OR IMMEDIATELY FOLLOWING A MEAL   mometasone (ELOCON) 0.1 % lotion Apply to affected area scalp once to twice daily until improved.   montelukast (SINGULAIR) 10 MG tablet Take 10 mg at bedtime by mouth.    Multiple Vitamin (MULTIVITAMIN) tablet Take 1 tablet by mouth daily.   pantoprazole (PROTONIX) 20 MG tablet Take 1 tablet (20 mg total) by mouth daily as needed.   pantoprazole (PROTONIX) 40 MG tablet TAKE 1 TABLET BY MOUTH EVERY DAY   Probiotic Product (PROBIOTIC ADVANCED PO) Take 1 capsule daily by mouth.    progesterone (PROMETRIUM) 100 MG capsule Take 100 mg daily by mouth.   progesterone (PROMETRIUM) 100 MG capsule Take 1 capsule (100 mg total) by mouth daily.   traZODone (DESYREL) 50 MG tablet TAKE 1/2 TO 1 TABLET(25 TO 50 MG) BY MOUTH AT BEDTIME AS NEEDED FOR SLEEP   tretinoin (RETIN-A) 0.05 % cream Apply a small amount to affected areas face every night as tolerated.   vitamin C (ASCORBIC ACID) 500 MG tablet Take 500 mg by mouth daily.   No facility-administered encounter medications on file as of 09/01/2022.     Lab Results  Component Value Date   WBC 8.4 11/25/2021   HGB 13.1 11/25/2021   HCT 39.4 11/25/2021   PLT 239.0 11/25/2021   GLUCOSE 93 12/02/2021   CHOL 192 07/14/2021   TRIG 77.0 07/14/2021   HDL 53.90 07/14/2021   LDLCALC 123 (H) 07/14/2021   ALT 19 10/16/2021   AST 20 10/16/2021   NA 139 12/02/2021   K 4.1  12/02/2021   CL 105 12/02/2021   CREATININE 0.69 12/02/2021   BUN 24 (H) 12/02/2021   CO2 27 12/02/2021   TSH 1.98 07/14/2021   INR 1.0 10/12/2021    MM 3D SCREEN BREAST BILATERAL  Result Date: 08/15/2022 CLINICAL DATA:  Screening. EXAM: DIGITAL SCREENING BILATERAL MAMMOGRAM WITH TOMOSYNTHESIS AND CAD TECHNIQUE: Bilateral screening digital craniocaudal and mediolateral oblique mammograms were obtained. Bilateral screening digital breast tomosynthesis was performed. The images were evaluated with computer-aided detection. COMPARISON:  Previous exam(s). ACR Breast Density Category c: The breasts are heterogeneously dense, which may obscure small masses. FINDINGS: There are no findings suspicious for malignancy. IMPRESSION: No mammographic evidence of malignancy. A result letter of this screening mammogram will be mailed directly to the patient. RECOMMENDATION: Screening mammogram in one year. (Code:SM-B-01Y) BI-RADS CATEGORY  1: Negative. Electronically Signed   By: Ted Mcalpine M.D.   On: 08/15/2022 10:50       Assessment & Plan:  Major depressive disorder, remission status unspecified, unspecified whether recurrent  Routine physical examination  Supraventricular tachycardia (HCC)  Gastro-esophageal reflux disease without esophagitis  Screening cholesterol level -     Lipid panel -     Hepatic function panel  Anemia, unspecified type -     CBC with Differential/Platelet -     Basic metabolic panel -     TSH  Screening for cervical cancer -     Cytology - PAP  Health care maintenance Assessment & Plan: Physical today 09/01/22.  Mammogram 08/15/22 - Birads I. Colonoscopy 07/2015.  PAP 06/10/21 - ok.  Repeat pap today.    Other  orders -     Pantoprazole Sodium; Take 1 tablet (20 mg total) by mouth daily as needed.  Dispense: 90 tablet; Refill: 1     Dale [place], MD

## 2022-09-02 NOTE — Assessment & Plan Note (Signed)
Discussed acid reflux.  Had symptoms if tries to stop protonix.  Discussed decreasing protonix to 20mg  - will alternate doses first.

## 2022-09-02 NOTE — Assessment & Plan Note (Signed)
Increased stress. Family and work stress.  Notify me if feels needs any further intervention.  Follow.

## 2022-09-02 NOTE — Assessment & Plan Note (Signed)
Sees Dr Gollan. On metoprolol 25mg.  Follow.  Stable.  

## 2022-09-02 NOTE — Assessment & Plan Note (Signed)
Have discussed bone density results.  Discussed calcium and vitamin D and weight bearing exercise.  Have discussed treatment options.  Will notify me if desires to start prescription medication.

## 2022-09-02 NOTE — Assessment & Plan Note (Signed)
Discussed slow taper.  Follow.

## 2022-09-04 ENCOUNTER — Ambulatory Visit: Payer: BC Managed Care – PPO | Admitting: Physical Therapy

## 2022-09-04 ENCOUNTER — Encounter: Payer: Self-pay | Admitting: Physical Therapy

## 2022-09-04 DIAGNOSIS — M6281 Muscle weakness (generalized): Secondary | ICD-10-CM

## 2022-09-04 DIAGNOSIS — M25552 Pain in left hip: Secondary | ICD-10-CM | POA: Diagnosis not present

## 2022-09-04 DIAGNOSIS — R262 Difficulty in walking, not elsewhere classified: Secondary | ICD-10-CM

## 2022-09-04 NOTE — Therapy (Signed)
OUTPATIENT PHYSICAL THERAPY TREATMENT NOTE   Patient Name: Theresa Buck MRN: [id number] DOB:Dec 10, 1955, 67 y.o., female Today's Date: 09/04/2022  PCP: Dale Little Sturgeon, MD   REFERRING PROVIDER: Reinaldo Berber, MD   END OF SESSION:   PT End of Session - 09/04/22 1954     Visit Number 5    Number of Visits 13    Date for PT Re-Evaluation 11/14/22    Authorization Type [employer] reporting period from 08/22/2022    Authorization Time Period VL: 30 PT/OT/Chiro - 24 remain    Authorization - Visit Number 5    Authorization - Number of Visits 24    Progress Note Due on Visit 10    PT Start Time 1902    PT Stop Time 1947    PT Time Calculation (min) 45 min    Activity Tolerance Patient tolerated treatment well    Behavior During Therapy WFL for tasks assessed/performed               Past Medical History:  Diagnosis Date   Allergy    recurring sinus problems   Anxiety    Arthritis    Chronic back pain    Deviated septum    Deviated septum    GERD (gastroesophageal reflux disease)    H/O sinusitis    Headache    H/O MIGRAINES   Hx of degenerative disc disease    Scoliosis    SVT (supraventricular tachycardia)    Past Surgical History:  Procedure Laterality Date   BACK SURGERY  09/26/2013   LUMBAR   COLONOSCOPY  08/03/2015   Dr Mechele Collin   HEMORRHOID SURGERY N/A 03/13/2017   Procedure: HEMORRHOIDECTOMY;  Surgeon: Kieth Brightly, MD;  Location: [facility];  Service: General;  Laterality: N/A;   SVT ABLATION N/A 09/16/2020   Procedure: SVT ABLATION;  Surgeon: Lanier Prude, MD;  Location: [facility];  Service: Cardiovascular;  Laterality: N/A;   TOTAL HIP ARTHROPLASTY Left 10/13/2021   Procedure: TOTAL HIP ARTHROPLASTY ANTERIOR APPROACH;  Surgeon: Kennedy Bucker, MD;  Location: [facility];  Service: Orthopedics;  Laterality: Left;   UPPER GI ENDOSCOPY  08/03/2015   Dr Mechele Collin   Patient Active Problem List   Diagnosis Date Noted    Osteoporosis 06/04/2022   Estrogen deficiency 02/25/2022   Encounter for completion of form with patient 10/29/2021   Leukocytosis 10/28/2021   Femoral neck fracture (HCC) 10/12/2021   LBBB (left bundle branch block) 10/12/2021   Depression 10/12/2021   Pre-op evaluation    Vaginal lesion 06/15/2021   Constipation 06/11/2021   Back pain 12/12/2020   Current use of estrogen therapy 12/12/2020   Hearing loss 06/30/2018   Hemorrhoids 12/10/2016   Fullness of breast 08/11/2015   Epigastric pain 02/14/2015   Pain of left foot 08/16/2014   Health care maintenance 08/16/2014   Other specified symptoms and signs involving the digestive system and abdomen 07/16/2014   Mastodynia 04/06/2013   Acute vaginitis 02/09/2013   Supraventricular tachycardia (HCC) 08/27/2012   Stress 08/27/2012   Other nonmedicinal substance allergy status 04/07/2012   Gastro-esophageal reflux disease without esophagitis 04/07/2012    REFERRING DIAG: 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 This patient's chief complaints consist of L anterior/lateral hip and anterior thigh pain with extended walking and shag dancing, and left groin pain with rotational movements (not as bad) leading to the following functional deficits: difficulty with usual activities such as walking, dancing, exercising, squatting down, moving into figure 4 position, putting on shoes, walking at work. Relevant past medical history and comorbidities include anxiety, arthritis, chronic back pain (lumbar surgery 2015), deviates septum, GERD, scoliosis, SVT (ablation 2022), L  THA anterior approach (10/13/2021, Dr. Rosita Kea), allergies, left foot pain, hearing loss, leukocytosis, osteoporosis.  Patient denies hx of cancer, stroke, seizures, lung problems, diabetes, unexplained weight loss, unexplained changes in bowel or bladder problems, paresthesia, and unexplained stumbling or dropping things.   PRECAUTIONS: osteoporosis  SUBJECTIVE:                                                                                                                                                                                      SUBJECTIVE STATEMENT:  Patient reports she is feeling well. She states she did her HEP last night. She was okay after last PT session.    PAIN:  NPRS: 0/10 at rest.    OBJECTIVE  SELF-REPORTED FUNCTION FOTO score: 53/100 (hip questionnaire)   TODAY'S TREATMENT:  Therapeutic exercise: to centralize symptoms and improve ROM, strength, muscular endurance, and activity tolerance required for successful completion of functional activities.  - NuStep level 3 using bilateral upper and lower extremities. Seat/handle setting 9/11. For improved extremity mobility, muscular endurance, and activity tolerance; and to induce the analgesic effect of aerobic exercise, stimulate improved joint nutrition, and prepare body structures and systems for following interventions. x8  minutes. Average SPM = 68. - quarter turn step up, towards stepping leg, to 12 inch step, UE support available as needed, 2x10 each direction.  - walking mini-lunge with trunk/hip twists holding 10#DB in front of waist. 4x20 feet.  - standing jumping jacks, 3x10 with no UE support.  - standing left hip flexion repeater with R foot on 4 inch step and UE support, 5# ankle weight, 3x10. - single leg hinge to cone tap with contralateral UE, 3x5 taps each side (5  cones arranged in a half circle around body - able to tap all 5 cones when standing on R but only able to tap the front 3 when standing on left).   - hip windmills with toe on ground for support, 3x10 each side.  - attempted single leg RDL/hip hinge standing on left with L UE support but discontinued due to low back pain.  - quadruped bird dog 1x7 R  LE/L UE repeater but discontinued due to wrist pain. Unable to continue with opposite side due to low back pain.  - Education on HEP including handout   Pt required multimodal cuing for proper technique and to facilitate improved neuromuscular control, strength, range of motion, and functional ability resulting in improved performance and form.    PATIENT EDUCATION:  Education details:  Exercise purpose/form. Self management techniques. Education on HEP including handout.  Dry needling Reviewed cancelation/no-show policy with patient and confirmed patient has correct phone number for clinic; patient verbalized understanding (08/22/22). Person educated: Patient Education method: Explanation Education comprehension: verbalized understanding and needs further education   HOME EXERCISE PROGRAM: Access Code: [email or web address] URL: [email or web address] Date: 08/28/2022 Prepared by: Norton Blizzard  Exercises - Hip Hikes off step  - 1 x daily - 3-5 x weekly - 3 sets - 15 reps - Supine Hip Flexion with Resistance Loop  - 3-5 x weekly - 3 sets - 10 reps - Single Leg Bridge  - 3-5 x weekly - 3 sets - 10 reps - Staggered Sit-to-Stand  - 1 x daily - 3 sets - 10 reps  HOME EXERCISE PROGRAM [UESMPYT] View at [email or web address]" using code: [email or web address] Windmill -  Repeat 10 Repetitions, Complete 3 Sets, Perform 3 Times a Week   ASSESSMENT:   CLINICAL IMPRESSION: Patient arrives reporting continued difficulty with pain during usual activities but good tolerance to HEP and last PT session. Today's session continued to focus on hip and LE strengthening, motor control, and activity tolerance, especially in a fashion that required rotational stability.  Patient had some discomfort during exercises  but no lasting pain. She was limited in hinge and bird dog due to low back and wrist pain. Patient gains confidence with repetition and does demonstrate decreased balance, strength, and motor control on left compared to right hip. Patient would benefit from continued management of limiting condition by skilled physical therapist to address remaining impairments and functional limitations to work towards stated goals and return to PLOF or maximal functional independence.   From initial PT Eval 08/22/2022:  Patient is a 67 y.o. 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 Patient has significant weakness with reproduction of lateral and anterior hip pain with resisted hip flexion and hip abduction and reproduction of deep groin pain with palpation to proximal hip adductors and FADDIR test. Patient has strength deficits in L hip flexion, abduction, and adduction. She has some end range stiffness with mild ROM deficits in L hip flexion and ER and demonstrates shortened rectus femoris on L compared to right. 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 She also has some apprehension about use of left hip that would benefit from LE and functional strengthening and ROM exercises paired with reassurance and education. Patient presents with significant pain, ROM, joint stiffness, muscle tension, knowledge, muscle performance (strength/power/endurance) and activity tolerance  impairments that are limiting ability to complete her usual activities such as walking, dancing, exercising, squatting down, moving into figure 4 position, putting on shoes, walking at work without difficulty. Patient will benefit from skilled physical therapy intervention to address current  body structure impairments and activity limitations to improve function and work towards goals set in current POC in order to return to prior level of function or maximal  functional improvement.    OBJECTIVE IMPAIRMENTS: decreased activity tolerance, decreased endurance, decreased knowledge of condition, decreased mobility, difficulty walking, decreased ROM, decreased strength, hypomobility, impaired perceived functional ability, increased muscle spasms, impaired flexibility, and pain.    ACTIVITY LIMITATIONS: squatting, walking, dressing, exercising, dancing, and locomotion level.   PARTICIPATION LIMITATIONS: community activity, occupation, and   difficulty with usual activities such as walking, dancing, exercising, squatting down, moving into figure 4 position, putting on shoes, walking at work   PERSONAL FACTORS: Past/current experiences, Time since onset of injury/illness/exacerbation, and 3+ comorbidities:   anxiety, arthritis, chronic back pain (lumbar surgery 2015), deviates septum, GERD, history of migraines, scoliosis, SVT (ablation 2022), L THA anterior approach (10/13/2021, Dr. Rosita Kea), allergies, left foot pain, hearing loss, leukocytosis, osteoporosis are also affecting patient's functional outcome.    REHAB POTENTIAL: Good   CLINICAL DECISION MAKING: Evolving/moderate complexity   EVALUATION COMPLEXITY: Moderate     GOALS: Goals reviewed with patient? No   SHORT TERM GOALS: Target date: 09/05/2022   Patient will be independent with initial home exercise program for self-management of symptoms. Baseline: Initial HEP to be provided at visit 2 as appropriate (08/22/22); Goal status: In-progress     LONG TERM GOALS: Target date: 11/14/2022   Patient will be independent with a long-term home exercise program for self-management of symptoms.  Baseline: Initial HEP to be provided at visit 2 as appropriate (08/22/22); Goal status: In-progress   2.  Patient will demonstrate improved  FOTO b7 equal or greater than 10 points by visit #12 to demonstrate improvement in overall condition and self-reported functional ability.  Baseline: to be measured at visit 2 as appropriate (08/22/22); Goal status: In-progress   3.  Patient will demonstrate 4+/5 strength in L hip flexion, abduction, and adduction without increase in symptoms to improve her ability to walk and dance without limitation. Baseline: weak and painful - see objective exam (08/22/22); Goal status: In-progress   4.  Patient will report decrease in pain during functional activities to equal or less than 3/10 to improve her ability to participate in dancing and walking activities.  Baseline: up to 10/10 (08/22/22); Goal status: In-progress   5.  Patient will complete community, work and/or recreational activities with 75% less limitation due to current condition.  Baseline: difficulty with usual activities such as walking, dancing, exercising, squatting down, moving into figure 4 position, putting on shoes, walking at work (08/22/22); Goal status: In-progress         PLAN:   PT FREQUENCY: 1-2x/week   PT DURATION: 12 weeks   PLANNED INTERVENTIONS: Therapeutic exercises, Therapeutic activity, Neuromuscular re-education, Balance training, Gait training, Patient/Family education, Self Care, Joint mobilization, Aquatic Therapy, Dry Needling, Electrical stimulation, Spinal mobilization, Cryotherapy, Moist heat, Manual therapy, and Re-evaluation   PLAN FOR NEXT SESSION: Update HEP as appropriate, progressive hip/LE/functional strengthening, tolerated loading through L hip abductors, adductors, and hip flexors to stimulate tendon remodeling, ROM and stretching as needed to improve stiffness, manual therapy and dry needling as needed, education.     Cira Rue, PT, DPT 09/04/2022, 7:56 PM  [facility] [address] [place], [place] [address] P: [phone] I F: [phone]

## 2022-09-05 LAB — CYTOLOGY - PAP
Adequacy: ABSENT
Comment: NEGATIVE
Diagnosis: NEGATIVE
High risk HPV: NEGATIVE

## 2022-09-06 ENCOUNTER — Ambulatory Visit: Payer: BC Managed Care – PPO | Admitting: Physical Therapy

## 2022-09-06 ENCOUNTER — Encounter: Payer: Self-pay | Admitting: Physical Therapy

## 2022-09-06 DIAGNOSIS — M25552 Pain in left hip: Secondary | ICD-10-CM

## 2022-09-06 DIAGNOSIS — R262 Difficulty in walking, not elsewhere classified: Secondary | ICD-10-CM

## 2022-09-06 DIAGNOSIS — M6281 Muscle weakness (generalized): Secondary | ICD-10-CM

## 2022-09-06 NOTE — Therapy (Signed)
OUTPATIENT PHYSICAL THERAPY TREATMENT NOTE   Patient Name: Theresa Buck MRN: [id number] DOB:06/16/1955, 67 y.o., female Today's Date: 09/06/2022  PCP: Dale Ihlen, MD   REFERRING PROVIDER: Reinaldo Berber, MD   END OF SESSION:   PT End of Session - 09/06/22 1918     Visit Number 6    Number of Visits 13    Date for PT Re-Evaluation 11/14/22    Authorization Type [employer] reporting period from 08/22/2022    Authorization Time Period VL: 30 PT/OT/Chiro - 24 remain    Authorization - Visit Number 6    Authorization - Number of Visits 24    Progress Note Due on Visit 10    PT Start Time 1910    PT Stop Time 1945    PT Time Calculation (min) 35 min    Activity Tolerance Patient tolerated treatment well    Behavior During Therapy WFL for tasks assessed/performed                Past Medical History:  Diagnosis Date   Allergy    recurring sinus problems   Anxiety    Arthritis    Chronic back pain    Deviated septum    Deviated septum    GERD (gastroesophageal reflux disease)    H/O sinusitis    Headache    H/O MIGRAINES   Hx of degenerative disc disease    Scoliosis    SVT (supraventricular tachycardia)    Past Surgical History:  Procedure Laterality Date   BACK SURGERY  09/26/2013   LUMBAR   COLONOSCOPY  08/03/2015   Dr Mechele Collin   HEMORRHOID SURGERY N/A 03/13/2017   Procedure: HEMORRHOIDECTOMY;  Surgeon: Kieth Brightly, MD;  Location: [facility];  Service: General;  Laterality: N/A;   SVT ABLATION N/A 09/16/2020   Procedure: SVT ABLATION;  Surgeon: Lanier Prude, MD;  Location: [facility];  Service: Cardiovascular;  Laterality: N/A;   TOTAL HIP ARTHROPLASTY Left 10/13/2021   Procedure: TOTAL HIP ARTHROPLASTY ANTERIOR APPROACH;  Surgeon: Kennedy Bucker, MD;  Location: [facility];  Service: Orthopedics;  Laterality: Left;   UPPER GI ENDOSCOPY  08/03/2015   Dr Mechele Collin   Patient Active Problem List   Diagnosis Date Noted    Osteoporosis 06/04/2022   Estrogen deficiency 02/25/2022   Encounter for completion of form with patient 10/29/2021   Leukocytosis 10/28/2021   Femoral neck fracture (HCC) 10/12/2021   LBBB (left bundle branch block) 10/12/2021   Depression 10/12/2021   Pre-op evaluation    Vaginal lesion 06/15/2021   Constipation 06/11/2021   Back pain 12/12/2020   Current use of estrogen therapy 12/12/2020   Hearing loss 06/30/2018   Hemorrhoids 12/10/2016   Fullness of breast 08/11/2015   Epigastric pain 02/14/2015   Pain of left foot 08/16/2014   Health care maintenance 08/16/2014   Other specified symptoms and signs involving the digestive system and abdomen 07/16/2014   Mastodynia 04/06/2013   Acute vaginitis 02/09/2013   Supraventricular tachycardia (HCC) 08/27/2012   Stress 08/27/2012   Other nonmedicinal substance allergy status 04/07/2012   Gastro-esophageal reflux disease without esophagitis 04/07/2012    REFERRING DIAG: 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 This patient's chief complaints consist of L anterior/lateral hip and anterior thigh pain with extended walking and shag dancing, and left groin pain with rotational movements (not as bad) leading to the following functional deficits: difficulty with usual activities such as walking, dancing, exercising, squatting down, moving into figure 4 position, putting on shoes, walking at work. Relevant past medical history and comorbidities include anxiety, arthritis, chronic back pain (lumbar surgery 2015), deviates septum, GERD, scoliosis, SVT (ablation 2022), L  THA anterior approach (10/13/2021, Dr. Rosita Kea), allergies, left foot pain, hearing loss, leukocytosis, osteoporosis.  Patient denies hx of cancer, stroke, seizures, lung problems, diabetes, unexplained weight loss, unexplained changes in bowel or bladder problems, paresthesia, and unexplained stumbling or dropping things.   PRECAUTIONS: osteoporosis  SUBJECTIVE:                                                                                                                                                                                      SUBJECTIVE STATEMENT:  Patient reports her muscles in her left hip are sore but it is okay with her. She states her back is doing okay. She states she tried the treadmill at home yesterday and was only able to do a quarter mile because she could not take the pain anymore. She was not going fast.    PAIN:  NPRS: 1/10 in L groin.    OBJECTIVE  TODAY'S TREATMENT:  Therapeutic exercise: to centralize symptoms and improve ROM, strength, muscular endurance, and activity tolerance required for successful completion of functional activities.  - Treadmill 2.4 mph at 0% grade with intermittent UE support. For improved lower extremity mobility, muscular endurance, and weightbearing activity tolerance; and to induce the analgesic effect of aerobic exercise, stimulate improved joint nutrition, and prepare body structures and systems for following interventions. x 7:28 minutes. Required min assistance to set up machine.  Mild pain in left groin after the first minute that progressed as time went on followed by starting lateral L hip pain.  - quarter turn step up, towards stepping leg, to 12 inch step, UE support available as needed, foot stays on step and pelvis rotates through full AROM,  3x10 each direction.  - BOSU squats on flat side, 1x10 with B UE support, 3x10 with hands hovering over TM bar.  - single leg ball toss on airex pad, 3x20 each side with small pink theraball.   - standing jumping jacks, 3x10 with no UE support.  - single leg hinge to cone tap with contralateral UE, 2x3 on LE LE, 1x3 taps on R LE. (3  cones arranged in  a half circle around body - weak and painful on L LE) - education on pacing for dancing.   Pt required multimodal cuing for proper technique and to facilitate improved neuromuscular control, strength, range of motion, and functional ability resulting in improved performance and form.    PATIENT EDUCATION:  Education details:  Exercise purpose/form. Self management techniques. Education on HEP including handout.  Dry needling Reviewed cancelation/no-show policy with patient and confirmed patient has correct phone number for clinic; patient verbalized understanding (08/22/22). Person educated: Patient Education method: Explanation Education comprehension: verbalized understanding and needs further education   HOME EXERCISE PROGRAM: Access Code: [email or web address] URL: [email or web address] Date: 08/28/2022 Prepared by: Norton Blizzard  Exercises - Hip Hikes off step  - 1 x daily - 3-5 x weekly - 3 sets - 15 reps - Supine Hip Flexion with Resistance Loop  - 3-5 x weekly - 3 sets - 10 reps - Single Leg Bridge  - 3-5 x weekly - 3 sets - 10 reps - Staggered Sit-to-Stand  - 1 x daily - 3 sets - 10 reps  HOME EXERCISE PROGRAM [[email or web address]] View at [email or web address]" using code: [email or web address] Windmill -  Repeat 10 Repetitions, Complete 3 Sets, Perform 3 Times a Week   ASSESSMENT:   CLINICAL IMPRESSION: Patient arrives reporting some acceptable soreness in her left hip muscles after last PT session. Continued with exercises focused on improving L hip strength, proprioception, and activity tolerance. Patient has the most difficulty with single leg hinge on L side and would benefit from regressed version of this exercise. He had some pain/discomfort during exercises today that resolved quickly with rest. Patient would benefit from continued  management of limiting condition by skilled physical therapist to address remaining impairments and functional limitations to work towards stated goals and return to PLOF or maximal functional independence.   From initial PT Eval 08/22/2022:  Patient is a 67 y.o. 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 Patient has significant weakness with reproduction of lateral and anterior hip pain with resisted hip flexion and hip abduction and reproduction of deep groin pain with palpation to proximal hip adductors and FADDIR test. Patient has strength deficits in L hip flexion, abduction, and adduction. She has some end range stiffness with mild ROM deficits in L hip flexion and ER and demonstrates shortened rectus femoris on L compared to right. 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 She also has some apprehension about use of left hip that would benefit from LE and functional strengthening and ROM exercises paired with reassurance and education. Patient presents with significant pain, ROM, joint stiffness, muscle tension, knowledge, muscle performance (strength/power/endurance) and activity tolerance  impairments that are limiting ability to complete her usual activities such as walking, dancing, exercising, squatting down, moving into figure 4 position, putting on shoes, walking at work without difficulty. Patient will benefit from skilled physical therapy intervention to address current body structure impairments and activity limitations to improve function and work towards goals set in current POC in order to return to prior level of function or maximal functional improvement.    OBJECTIVE IMPAIRMENTS: decreased activity tolerance, decreased  endurance, decreased knowledge of condition, decreased mobility, difficulty walking, decreased ROM, decreased strength, hypomobility, impaired perceived functional ability, increased muscle spasms, impaired flexibility, and pain.  ACTIVITY LIMITATIONS: squatting, walking, dressing, exercising, dancing, and locomotion level.   PARTICIPATION LIMITATIONS: community activity, occupation, and   difficulty with usual activities such as walking, dancing, exercising, squatting down, moving into figure 4 position, putting on shoes, walking at work   PERSONAL FACTORS: Past/current experiences, Time since onset of injury/illness/exacerbation, and 3+ comorbidities:   anxiety, arthritis, chronic back pain (lumbar surgery 2015), deviates septum, GERD, history of migraines, scoliosis, SVT (ablation 2022), L THA anterior approach (10/13/2021, Dr. Rosita Kea), allergies, left foot pain, hearing loss, leukocytosis, osteoporosis are also affecting patient's functional outcome.    REHAB POTENTIAL: Good   CLINICAL DECISION MAKING: Evolving/moderate complexity   EVALUATION COMPLEXITY: Moderate     GOALS: Goals reviewed with patient? No   SHORT TERM GOALS: Target date: 09/05/2022   Patient will be independent with initial home exercise program for self-management of symptoms. Baseline: Initial HEP to be provided at visit 2 as appropriate (08/22/22); Goal status: In-progress     LONG TERM GOALS: Target date: 11/14/2022   Patient will be independent with a long-term home exercise program for self-management of symptoms.  Baseline: Initial HEP to be provided at visit 2 as appropriate (08/22/22); Goal status: In-progress   2.  Patient will demonstrate improved FOTO b7 equal or greater than 10 points by visit #12 to demonstrate improvement in overall condition and self-reported functional ability.  Baseline: to be measured at visit 2 as appropriate (08/22/22); Goal status: In-progress   3.  Patient will demonstrate  4+/5 strength in L hip flexion, abduction, and adduction without increase in symptoms to improve her ability to walk and dance without limitation. Baseline: weak and painful - see objective exam (08/22/22); Goal status: In-progress   4.  Patient will report decrease in pain during functional activities to equal or less than 3/10 to improve her ability to participate in dancing and walking activities.  Baseline: up to 10/10 (08/22/22); Goal status: In-progress   5.  Patient will complete community, work and/or recreational activities with 75% less limitation due to current condition.  Baseline: difficulty with usual activities such as walking, dancing, exercising, squatting down, moving into figure 4 position, putting on shoes, walking at work (08/22/22); Goal status: In-progress         PLAN:   PT FREQUENCY: 1-2x/week   PT DURATION: 12 weeks   PLANNED INTERVENTIONS: Therapeutic exercises, Therapeutic activity, Neuromuscular re-education, Balance training, Gait training, Patient/Family education, Self Care, Joint mobilization, Aquatic Therapy, Dry Needling, Electrical stimulation, Spinal mobilization, Cryotherapy, Moist heat, Manual therapy, and Re-evaluation   PLAN FOR NEXT SESSION: Update HEP as appropriate, progressive hip/LE/functional strengthening, tolerated loading through L hip abductors, adductors, and hip flexors to stimulate tendon remodeling, ROM and stretching as needed to improve stiffness, manual therapy and dry needling as needed, education.     Cira Rue, PT, DPT 09/06/2022, 8:01 PM  [facility] [address] [place], [place] [address] P: [phone] I F: [phone]

## 2022-09-12 ENCOUNTER — Encounter: Payer: Self-pay | Admitting: Physical Therapy

## 2022-09-12 ENCOUNTER — Ambulatory Visit: Payer: BC Managed Care – PPO | Admitting: Physical Therapy

## 2022-09-12 DIAGNOSIS — M25552 Pain in left hip: Secondary | ICD-10-CM

## 2022-09-12 DIAGNOSIS — R262 Difficulty in walking, not elsewhere classified: Secondary | ICD-10-CM

## 2022-09-12 DIAGNOSIS — M6281 Muscle weakness (generalized): Secondary | ICD-10-CM

## 2022-09-12 NOTE — Therapy (Signed)
OUTPATIENT PHYSICAL THERAPY TREATMENT NOTE   Patient Name: Theresa Buck MRN: [id number] DOB:1956/01/14, 67 y.o., female Today's Date: 09/12/2022  PCP: [place] Buckhorn, MD   REFERRING PROVIDER: Reinaldo Berber, MD   END OF SESSION:   PT End of Session - 09/12/22 1852     Visit Number 7    Number of Visits 13    Date for PT Re-Evaluation 11/14/22    Authorization Type [employer] reporting period from 08/22/2022    Authorization Time Period VL: 30 PT/OT/Chiro - 24 remain    Authorization - Visit Number 7    Authorization - Number of Visits 24    Progress Note Due on Visit 10    PT Start Time 1817    PT Stop Time 1857    PT Time Calculation (min) 40 min    Activity Tolerance Patient tolerated treatment well    Behavior During Therapy WFL for tasks assessed/performed                Past Medical History:  Diagnosis Date   Allergy    recurring sinus problems   Anxiety    Arthritis    Chronic back pain    Deviated septum    Deviated septum    GERD (gastroesophageal reflux disease)    H/O sinusitis    Headache    H/O MIGRAINES   Hx of degenerative disc disease    Scoliosis    SVT (supraventricular tachycardia)    Past Surgical History:  Procedure Laterality Date   BACK SURGERY  09/26/2013   LUMBAR   COLONOSCOPY  08/03/2015   Dr Mechele Collin   HEMORRHOID SURGERY N/A 03/13/2017   Procedure: HEMORRHOIDECTOMY;  Surgeon: Kieth Brightly, MD;  Location: [facility];  Service: General;  Laterality: N/A;   SVT ABLATION N/A 09/16/2020   Procedure: SVT ABLATION;  Surgeon: Lanier Prude, MD;  Location: [facility];  Service: Cardiovascular;  Laterality: N/A;   TOTAL HIP ARTHROPLASTY Left 10/13/2021   Procedure: TOTAL HIP ARTHROPLASTY ANTERIOR APPROACH;  Surgeon: Kennedy Bucker, MD;  Location: [facility];  Service: Orthopedics;  Laterality: Left;   UPPER GI ENDOSCOPY  08/03/2015   Dr Mechele Collin   Patient Active Problem List   Diagnosis Date Noted    Osteoporosis 06/04/2022   Estrogen deficiency 02/25/2022   Encounter for completion of form with patient 10/29/2021   Leukocytosis 10/28/2021   Femoral neck fracture (HCC) 10/12/2021   LBBB (left bundle branch block) 10/12/2021   Depression 10/12/2021   Pre-op evaluation    Vaginal lesion 06/15/2021   Constipation 06/11/2021   Back pain 12/12/2020   Current use of estrogen therapy 12/12/2020   Hearing loss 06/30/2018   Hemorrhoids 12/10/2016   Fullness of breast 08/11/2015   Epigastric pain 02/14/2015   Pain of left foot 08/16/2014   Health care maintenance 08/16/2014   Other specified symptoms and signs involving the digestive system and abdomen 07/16/2014   Mastodynia 04/06/2013   Acute vaginitis 02/09/2013   Supraventricular tachycardia (HCC) 08/27/2012   Stress 08/27/2012   Other nonmedicinal substance allergy status 04/07/2012   Gastro-esophageal reflux disease without esophagitis 04/07/2012    REFERRING DIAG: 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 This patient's chief complaints consist of L anterior/lateral hip and anterior thigh pain with extended walking and shag dancing, and left groin pain with rotational movements (not as bad) leading to the following functional deficits: difficulty with usual activities such as walking, dancing, exercising, squatting down, moving into figure 4 position, putting on shoes, walking at work. Relevant past medical history and comorbidities include anxiety, arthritis, chronic back pain (lumbar surgery 2015), deviates septum, GERD, scoliosis, SVT (ablation 2022), L  THA anterior approach (10/13/2021, Dr. Rosita Kea), allergies, left foot pain, hearing loss, leukocytosis, osteoporosis.  Patient denies hx of cancer, stroke, seizures, lung problems, diabetes, unexplained weight loss, unexplained changes in bowel or bladder problems, paresthesia, and unexplained stumbling or dropping things.   PRECAUTIONS: osteoporosis  SUBJECTIVE:                                                                                                                                                                                      SUBJECTIVE STATEMENT:  Patient reports she is doing okay. She danced on Friday and did better than before, although she still had pain. She was able to complete the whole line dance. She does not currently have pain. She was a little sore after last PT session. She states her left hip is not hurting as much but it still feels "not normal." She has been doing her HEP. She states she has been going to the other provider for dry needling for about 3 weeks. She feels like PT and dry needling is helping and she needs both.   PAIN:  NPRS: 0/10   OBJECTIVE  TODAY'S TREATMENT:  Therapeutic exercise: to centralize symptoms and improve ROM, strength, muscular endurance, and activity tolerance required for successful completion of functional activities.  - Treadmill up to 3 mph at 5% grade with intermittent UE support. For improved lower extremity mobility, muscular endurance, and weightbearing activity tolerance; and to induce the analgesic effect of aerobic exercise, stimulate improved joint nutrition, and prepare body structures and systems for following interventions. x 5 minutes. Required min assistance to set up machine.   - sled push with 90#, 6x25 feet using low grip on vertical poles and high horizontal bar.  - quarter turn step up, towards stepping leg, to 12 inch step, UE support available as needed, foot stays on step and pelvis rotates through full AROM,  3x10  each direction.  - BOSU squats on flat side, 3x10 with hands hovering over TM bar.  - single leg ball toss on airex pad, 3x20 each side with small pink theraball.  - standing jumping jacks,  3x15 with no UE support.  - kickstand RDL with 10#KB to 4 inch yoga block, 2x10 each side, weight in contralateral UE.   Pt required multimodal cuing for proper technique and to facilitate improved neuromuscular control, strength, range of motion, and functional ability resulting in improved performance and form.    PATIENT EDUCATION:  Education details:  Exercise purpose/form. Self management techniques. Education on HEP including handout.  Dry needling Reviewed cancelation/no-show policy with patient and confirmed patient has correct phone number for clinic; patient verbalized understanding (08/22/22). Person educated: Patient Education method: Explanation Education comprehension: verbalized understanding and needs further education   HOME EXERCISE PROGRAM: Access Code: [email or web address] URL: [email or web address] Date: 08/28/2022 Prepared by: Norton Blizzard  Exercises - Hip Hikes off step  - 1 x daily - 3-5 x weekly - 3 sets - 15 reps - Supine Hip Flexion with Resistance Loop  - 3-5 x weekly - 3 sets - 10 reps - Single Leg Bridge  - 3-5 x weekly - 3 sets - 10 reps - Staggered Sit-to-Stand  - 1 x daily - 3 sets - 10 reps  HOME EXERCISE PROGRAM [[email or web address]] View at [email or web address]" using code: [email or web address] Windmill -  Repeat 10 Repetitions, Complete 3 Sets, Perform 3 Times a Week   ASSESSMENT:   CLINICAL IMPRESSION: Patient arrives reporting some improvement with dancing last Friday. Today's session continued working on exercises focusing on L hip strength and proprioception. Patient continues to fatigue more quickly at the left hip compared to the right but her discomfort calms down quickly. Patient continues to be appropriately challenged and requires cuing for effective form. Patient would  benefit from continued management of limiting condition by skilled physical therapist to address remaining impairments and functional limitations to work towards stated goals and return to PLOF or maximal functional independence.   From initial PT Eval 08/22/2022:  Patient is a 67 y.o. 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 Patient has significant weakness with reproduction of lateral and anterior hip pain with resisted hip flexion and hip abduction and reproduction of deep groin pain with palpation to proximal hip adductors and FADDIR test. Patient has strength deficits in L hip flexion, abduction, and adduction. She has some end range stiffness with mild ROM deficits in L hip flexion and ER and demonstrates shortened rectus femoris on L compared to right. 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 She also has some apprehension about use of left hip that would benefit from LE and functional strengthening and ROM exercises paired with reassurance and education. Patient presents with significant pain, ROM, joint stiffness, muscle tension, knowledge, muscle performance (strength/power/endurance) and activity tolerance  impairments that are limiting ability to complete her usual activities such as walking, dancing, exercising, squatting down, moving into figure 4 position, putting on shoes, walking at work without difficulty. Patient will benefit from skilled physical therapy intervention to address current body structure impairments and activity limitations to improve function and work towards goals set in current POC in order to return to prior level of function or maximal functional improvement.    OBJECTIVE IMPAIRMENTS: decreased activity  tolerance, decreased endurance, decreased knowledge of condition, decreased mobility, difficulty walking, decreased ROM, decreased strength, hypomobility, impaired perceived functional ability, increased muscle spasms, impaired flexibility, and pain.  ACTIVITY LIMITATIONS: squatting, walking, dressing, exercising, dancing, and locomotion level.   PARTICIPATION LIMITATIONS: community activity, occupation, and   difficulty with usual activities such as walking, dancing, exercising, squatting down, moving into figure 4 position, putting on shoes, walking at work   PERSONAL FACTORS: Past/current experiences, Time since onset of injury/illness/exacerbation, and 3+ comorbidities:   anxiety, arthritis, chronic back pain (lumbar surgery 2015), deviates septum, GERD, history of migraines, scoliosis, SVT (ablation 2022), L THA anterior approach (10/13/2021, Dr. Rosita Kea), allergies, left foot pain, hearing loss, leukocytosis, osteoporosis are also affecting patient's functional outcome.    REHAB POTENTIAL: Good   CLINICAL DECISION MAKING: Evolving/moderate complexity   EVALUATION COMPLEXITY: Moderate     GOALS: Goals reviewed with patient? No   SHORT TERM GOALS: Target date: 09/05/2022   Patient will be independent with initial home exercise program for self-management of symptoms. Baseline: Initial HEP to be provided at visit 2 as appropriate (08/22/22); Goal status: In-progress     LONG TERM GOALS: Target date: 11/14/2022   Patient will be independent with a long-term home exercise program for self-management of symptoms.  Baseline: Initial HEP to be provided at visit 2 as appropriate (08/22/22); Goal status: In-progress   2.  Patient will demonstrate improved FOTO b7 equal or greater than 10 points by visit #12 to demonstrate improvement in overall condition and self-reported functional ability.  Baseline: to be measured at visit 2 as appropriate (08/22/22); Goal status: In-progress   3.   Patient will demonstrate 4+/5 strength in L hip flexion, abduction, and adduction without increase in symptoms to improve her ability to walk and dance without limitation. Baseline: weak and painful - see objective exam (08/22/22); Goal status: In-progress   4.  Patient will report decrease in pain during functional activities to equal or less than 3/10 to improve her ability to participate in dancing and walking activities.  Baseline: up to 10/10 (08/22/22); Goal status: In-progress   5.  Patient will complete community, work and/or recreational activities with 75% less limitation due to current condition.  Baseline: difficulty with usual activities such as walking, dancing, exercising, squatting down, moving into figure 4 position, putting on shoes, walking at work (08/22/22); Goal status: In-progress         PLAN:   PT FREQUENCY: 1-2x/week   PT DURATION: 12 weeks   PLANNED INTERVENTIONS: Therapeutic exercises, Therapeutic activity, Neuromuscular re-education, Balance training, Gait training, Patient/Family education, Self Care, Joint mobilization, Aquatic Therapy, Dry Needling, Electrical stimulation, Spinal mobilization, Cryotherapy, Moist heat, Manual therapy, and Re-evaluation   PLAN FOR NEXT SESSION: Update HEP as appropriate, progressive hip/LE/functional strengthening, tolerated loading through L hip abductors, adductors, and hip flexors to stimulate tendon remodeling, ROM and stretching as needed to improve stiffness, manual therapy and dry needling as needed, education.     Cira Rue, PT, DPT 09/12/2022, 7:12 PM  [facility] [address] [place], [place] [address] P: [phone] I F: [phone]

## 2022-09-13 ENCOUNTER — Ambulatory Visit: Payer: BC Managed Care – PPO | Admitting: Physical Therapy

## 2022-09-14 ENCOUNTER — Telehealth: Payer: Self-pay | Admitting: Physical Therapy

## 2022-09-14 NOTE — Telephone Encounter (Signed)
Called patient back as requested. Left VM letting her know I got the message she felt she had hurt her knee at PT on Tuesday. Recommended rest and ice if tolerated and that inflammation phase usually lasts 5-7 days.   Luretha Murphy. Ilsa Iha, PT, DPT 09/14/22, 5:41 PM  [facility] [address] [place], [place] [address] P: [phone] I F: [phone]

## 2022-09-25 ENCOUNTER — Encounter: Payer: BC Managed Care – PPO | Admitting: Physical Therapy

## 2022-09-27 ENCOUNTER — Encounter: Payer: BC Managed Care – PPO | Admitting: Physical Therapy

## 2022-10-02 ENCOUNTER — Encounter: Payer: Self-pay | Admitting: Physical Therapy

## 2022-10-02 ENCOUNTER — Ambulatory Visit: Payer: Medicare Other | Attending: Orthopedic Surgery | Admitting: Physical Therapy

## 2022-10-02 DIAGNOSIS — M25552 Pain in left hip: Secondary | ICD-10-CM | POA: Diagnosis present

## 2022-10-02 DIAGNOSIS — M6281 Muscle weakness (generalized): Secondary | ICD-10-CM | POA: Insufficient documentation

## 2022-10-02 DIAGNOSIS — R262 Difficulty in walking, not elsewhere classified: Secondary | ICD-10-CM | POA: Insufficient documentation

## 2022-10-02 NOTE — Therapy (Signed)
OUTPATIENT PHYSICAL THERAPY TREATMENT NOTE   Patient Name: Theresa Buck MRN: [id number] DOB:1955-12-07, 67 y.o., female Today's Date: 10/02/2022  PCP: Dale Spok[place], MD   REFERRING PROVIDER: Reinaldo Berber, MD   END OF SESSION:   PT End of Session - 10/02/22 1828     Visit Number 8    Number of Visits 13    Date for PT Re-Evaluation 11/14/22    Authorization Type [employer] reporting period from 08/22/2022    Authorization Time Period VL: 30 PT/OT/Chiro - 24 remain    Authorization - Visit Number 8    Authorization - Number of Visits 24    Progress Note Due on Visit 10    PT Start Time 1828    PT Stop Time 1909    PT Time Calculation (min) 41 min    Activity Tolerance Patient tolerated treatment well;No increased pain    Behavior During Therapy WFL for tasks assessed/performed                 Past Medical History:  Diagnosis Date   Allergy    recurring sinus problems   Anxiety    Arthritis    Chronic back pain    Deviated septum    Deviated septum    GERD (gastroesophageal reflux disease)    H/O sinusitis    Headache    H/O MIGRAINES   Hx of degenerative disc disease    Scoliosis    SVT (supraventricular tachycardia)    Past Surgical History:  Procedure Laterality Date   BACK SURGERY  09/26/2013   LUMBAR   COLONOSCOPY  08/03/2015   Dr Mechele Collin   HEMORRHOID SURGERY N/A 03/13/2017   Procedure: HEMORRHOIDECTOMY;  Surgeon: Kieth Brightly, MD;  Location: [facility];  Service: General;  Laterality: N/A;   SVT ABLATION N/A 09/16/2020   Procedure: SVT ABLATION;  Surgeon: Lanier Prude, MD;  Location: [facility];  Service: Cardiovascular;  Laterality: N/A;   TOTAL HIP ARTHROPLASTY Left 10/13/2021   Procedure: TOTAL HIP ARTHROPLASTY ANTERIOR APPROACH;  Surgeon: Kennedy Bucker, MD;  Location: [facility];  Service: Orthopedics;  Laterality: Left;   UPPER GI ENDOSCOPY  08/03/2015   Dr Mechele Collin   Patient Active Problem List    Diagnosis Date Noted   Osteoporosis 06/04/2022   Estrogen deficiency 02/25/2022   Encounter for completion of form with patient 10/29/2021   Leukocytosis 10/28/2021   Femoral neck fracture (HCC) 10/12/2021   LBBB (left bundle branch block) 10/12/2021   Depression 10/12/2021   Pre-op evaluation    Vaginal lesion 06/15/2021   Constipation 06/11/2021   Back pain 12/12/2020   Current use of estrogen therapy 12/12/2020   Hearing loss 06/30/2018   Hemorrhoids 12/10/2016   Fullness of breast 08/11/2015   Epigastric pain 02/14/2015   Pain of left foot 08/16/2014   Health care maintenance 08/16/2014   Other specified symptoms and signs involving the digestive system and abdomen 07/16/2014   Mastodynia 04/06/2013   Acute vaginitis 02/09/2013   Supraventricular tachycardia (HCC) 08/27/2012   Stress 08/27/2012   Other nonmedicinal substance allergy status 04/07/2012   Gastro-esophageal reflux disease without esophagitis 04/07/2012    REFERRING DIAG: 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 This patient's chief complaints consist of L anterior/lateral hip and anterior thigh pain with extended walking and shag dancing, and left groin pain with rotational movements (not as bad) leading to the following functional deficits: difficulty with usual activities such as walking, dancing, exercising, squatting down, moving into figure 4 position, putting on shoes, walking at work. Relevant past medical history and comorbidities include anxiety, arthritis, chronic back pain (lumbar surgery 2015), deviates septum, GERD, scoliosis,  SVT (ablation 2022), L THA anterior approach (10/13/2021, Dr. Rosita Kea), allergies, left foot pain, hearing loss, leukocytosis, osteoporosis.  Patient denies hx of cancer, stroke, seizures, lung problems, diabetes, unexplained weight loss, unexplained changes in bowel or bladder problems, paresthesia, and unexplained stumbling or dropping things.   PRECAUTIONS: osteoporosis  SUBJECTIVE:                                                                                                                                                                                      SUBJECTIVE STATEMENT:  Patient reports she woke up with excruciating pain in her left knee, around the entire thing, mainly in the medial joint line. She has been taking it easy since then. It took a week an half for it to feel better. She took an ice pack to work. She has not been dancing since last PT session. She states her L hip is doing okay. She did go dancing Friday night. She did one line dance. She made it through okay but it did have some pain but not like it used to be. She thinks the needling is helping. She thinks maybe twisting irritated her knee when she was here last, but she is not sure.   PAIN:  NPRS: 0/10   OBJECTIVE  TODAY'S TREATMENT:  Therapeutic exercise: to centralize symptoms and improve ROM, strength, muscular endurance, and activity tolerance required for successful completion of functional activities.  - Treadmill 2.4 mph at 0% grade with intermittent UE support. For improved lower extremity mobility, muscular endurance, and weightbearing activity tolerance; and to induce the analgesic effect of aerobic exercise, stimulate improved joint nutrition, and prepare body structures and systems for following interventions. x 6 minutes. Required min assistance to set up machine.  Mild pain in left groin after the first minute that progressed as time went on followed by starting lateral L hip pain.   Circuit:  - lateral  stepping back and forth over small 6-inch aerobic step, 3x10 each direction with 4#AW on each foot.  - staggered squat to buttocks tap on 18 inch chair with 2#DB held at shoulders, L LE  back, 3x10.   - hip hikes on 2x4 with U UE support, 3x15 each side with 4# AW.  - single leg stance ball toss, 3x20 each foot with small pink theraball.  - standing hip flexion knee repeater with opposite foot on 6 inch step and UE support, 3x10 each side with 4#AW.   Pt required multimodal cuing for proper technique and to facilitate improved neuromuscular control, strength, range of motion, and functional ability resulting in improved performance and form.    PATIENT EDUCATION:  Education details:  Exercise purpose/form. Self management techniques. Education on HEP including handout.  Dry needling Reviewed cancelation/no-show policy with patient and confirmed patient has correct phone number for clinic; patient verbalized understanding (08/22/22). Person educated: Patient Education method: Explanation Education comprehension: verbalized understanding and needs further education   HOME EXERCISE PROGRAM: Access Code: [email or web address] URL: [email or web address] Date: 08/28/2022 Prepared by: Norton Blizzard  Exercises - Hip Hikes off step  - 1 x daily - 3-5 x weekly - 3 sets - 15 reps - Supine Hip Flexion with Resistance Loop  - 3-5 x weekly - 3 sets - 10 reps - Single Leg Bridge  - 3-5 x weekly - 3 sets - 10 reps - Staggered Sit-to-Stand  - 1 x daily - 3 sets - 10 reps  HOME EXERCISE PROGRAM [UESMPYT] View at [email or web address]" using code: [email or web address] Windmill -  Repeat 10 Repetitions, Complete 3 Sets, Perform 3 Times a Week   ASSESSMENT:   CLINICAL IMPRESSION: Patient arrives reporting a lot of L knee pain that started in the night after last PT session. It has gradually gotten better and she is returning to the clinic this week after being away for other reasons. Today's session returned to  strengthening the hip and LE but regressed to include exercises that her L knee tolerated previously. Patient tolerated treatment well with no increase in pain in knee or hip by end of session. Patient would benefit from continued management of limiting condition by skilled physical therapist to address remaining impairments and functional limitations to work towards stated goals and return to PLOF or maximal functional independence.    From initial PT Eval 08/22/2022:  Patient is a 67 y.o. 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 Patient has significant weakness with reproduction of lateral and anterior hip pain with resisted hip flexion and hip abduction and reproduction of deep groin pain with palpation to proximal hip adductors and FADDIR test. Patient has strength deficits in L hip flexion, abduction, and adduction. She has some end range stiffness with mild ROM deficits in L hip flexion and ER and demonstrates shortened rectus femoris on L compared to right. 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 She also has some apprehension about use of left hip that would benefit from LE and functional strengthening and ROM exercises paired with reassurance and education. Patient presents with significant pain, ROM, joint stiffness, muscle tension, knowledge, muscle performance (strength/power/endurance) and activity tolerance  impairments that are limiting ability to complete her usual activities such as walking, dancing, exercising, squatting down, moving into figure 4 position, putting on shoes, walking at work without difficulty. Patient will benefit from skilled physical therapy intervention to address current body structure impairments and  activity limitations to improve function and work towards  goals set in current POC in order to return to prior level of function or maximal functional improvement.    OBJECTIVE IMPAIRMENTS: decreased activity tolerance, decreased endurance, decreased knowledge of condition, decreased mobility, difficulty walking, decreased ROM, decreased strength, hypomobility, impaired perceived functional ability, increased muscle spasms, impaired flexibility, and pain.    ACTIVITY LIMITATIONS: squatting, walking, dressing, exercising, dancing, and locomotion level.   PARTICIPATION LIMITATIONS: community activity, occupation, and   difficulty with usual activities such as walking, dancing, exercising, squatting down, moving into figure 4 position, putting on shoes, walking at work   PERSONAL FACTORS: Past/current experiences, Time since onset of injury/illness/exacerbation, and 3+ comorbidities:   anxiety, arthritis, chronic back pain (lumbar surgery 2015), deviates septum, GERD, history of migraines, scoliosis, SVT (ablation 2022), L THA anterior approach (10/13/2021, Dr. Rosita Kea), allergies, left foot pain, hearing loss, leukocytosis, osteoporosis are also affecting patient's functional outcome.    REHAB POTENTIAL: Good   CLINICAL DECISION MAKING: Evolving/moderate complexity   EVALUATION COMPLEXITY: Moderate     GOALS: Goals reviewed with patient? No   SHORT TERM GOALS: Target date: 09/05/2022   Patient will be independent with initial home exercise program for self-management of symptoms. Baseline: Initial HEP to be provided at visit 2 as appropriate (08/22/22); Goal status: In-progress     LONG TERM GOALS: Target date: 11/14/2022   Patient will be independent with a long-term home exercise program for self-management of symptoms.  Baseline: Initial HEP to be provided at visit 2 as appropriate (08/22/22); Goal status: In-progress   2.  Patient will demonstrate improved FOTO b7 equal or greater than  10 points by visit #12 to demonstrate improvement in overall condition and self-reported functional ability.  Baseline: to be measured at visit 2 as appropriate (08/22/22); Goal status: In-progress   3.  Patient will demonstrate 4+/5 strength in L hip flexion, abduction, and adduction without increase in symptoms to improve her ability to walk and dance without limitation. Baseline: weak and painful - see objective exam (08/22/22); Goal status: In-progress   4.  Patient will report decrease in pain during functional activities to equal or less than 3/10 to improve her ability to participate in dancing and walking activities.  Baseline: up to 10/10 (08/22/22); Goal status: In-progress   5.  Patient will complete community, work and/or recreational activities with 75% less limitation due to current condition.  Baseline: difficulty with usual activities such as walking, dancing, exercising, squatting down, moving into figure 4 position, putting on shoes, walking at work (08/22/22); Goal status: In-progress         PLAN:   PT FREQUENCY: 1-2x/week   PT DURATION: 12 weeks   PLANNED INTERVENTIONS: Therapeutic exercises, Therapeutic activity, Neuromuscular re-education, Balance training, Gait training, Patient/Family education, Self Care, Joint mobilization, Aquatic Therapy, Dry Needling, Electrical stimulation, Spinal mobilization, Cryotherapy, Moist heat, Manual therapy, and Re-evaluation   PLAN FOR NEXT SESSION: Update HEP as appropriate, progressive hip/LE/functional strengthening, tolerated loading through L hip abductors, adductors, and hip flexors to stimulate tendon remodeling, ROM and stretching as needed to improve stiffness, manual therapy and dry needling as needed, education.     Cira Rue, PT, DPT 10/02/2022, 7:18 PM  [facility] [address] [place], [place] [address] P: [phone] I F: [phone]

## 2022-10-04 ENCOUNTER — Ambulatory Visit: Payer: Medicare Other | Admitting: Physical Therapy

## 2022-10-04 ENCOUNTER — Encounter: Payer: Self-pay | Admitting: Physical Therapy

## 2022-10-04 DIAGNOSIS — R262 Difficulty in walking, not elsewhere classified: Secondary | ICD-10-CM

## 2022-10-04 DIAGNOSIS — M25552 Pain in left hip: Secondary | ICD-10-CM | POA: Diagnosis not present

## 2022-10-04 DIAGNOSIS — M6281 Muscle weakness (generalized): Secondary | ICD-10-CM

## 2022-10-04 NOTE — Therapy (Signed)
OUTPATIENT PHYSICAL THERAPY TREATMENT NOTE   Patient Name: Theresa Buck MRN: [id number] DOB:08-Jan-1956, 67 y.o., female Today's Date: 10/04/2022  PCP: Dale [place], MD   REFERRING PROVIDER: Reinaldo Berber, MD   END OF SESSION:   PT End of Session - 10/04/22 1910     Visit Number 9    Number of Visits 13    Date for PT Re-Evaluation 11/14/22    Authorization Type [employer] reporting period from 08/22/2022    Authorization Time Period VL: 30 PT/OT/Chiro - 24 remain    Authorization - Visit Number 9    Authorization - Number of Visits 24    Progress Note Due on Visit 10    PT Start Time 1905    PT Stop Time 1945    PT Time Calculation (min) 40 min    Activity Tolerance Patient tolerated treatment well;No increased pain    Behavior During Therapy WFL for tasks assessed/performed                  Past Medical History:  Diagnosis Date   Allergy    recurring sinus problems   Anxiety    Arthritis    Chronic back pain    Deviated septum    Deviated septum    GERD (gastroesophageal reflux disease)    H/O sinusitis    Headache    H/O MIGRAINES   Hx of degenerative disc disease    Scoliosis    SVT (supraventricular tachycardia)    Past Surgical History:  Procedure Laterality Date   BACK SURGERY  09/26/2013   LUMBAR   COLONOSCOPY  08/03/2015   Dr Mechele Collin   HEMORRHOID SURGERY N/A 03/13/2017   Procedure: HEMORRHOIDECTOMY;  Surgeon: Kieth Brightly, MD;  Location: [facility];  Service: General;  Laterality: N/A;   SVT ABLATION N/A 09/16/2020   Procedure: SVT ABLATION;  Surgeon: Lanier Prude, MD;  Location: [facility];  Service: Cardiovascular;  Laterality: N/A;   TOTAL HIP ARTHROPLASTY Left 10/13/2021   Procedure: TOTAL HIP ARTHROPLASTY ANTERIOR APPROACH;  Surgeon: Kennedy Bucker, MD;  Location: [facility];  Service: Orthopedics;  Laterality: Left;   UPPER GI ENDOSCOPY  08/03/2015   Dr Mechele Collin   Patient Active Problem List    Diagnosis Date Noted   Osteoporosis 06/04/2022   Estrogen deficiency 02/25/2022   Encounter for completion of form with patient 10/29/2021   Leukocytosis 10/28/2021   Femoral neck fracture (HCC) 10/12/2021   LBBB (left bundle branch block) 10/12/2021   Depression 10/12/2021   Pre-op evaluation    Vaginal lesion 06/15/2021   Constipation 06/11/2021   Back pain 12/12/2020   Current use of estrogen therapy 12/12/2020   Hearing loss 06/30/2018   Hemorrhoids 12/10/2016   Fullness of breast 08/11/2015   Epigastric pain 02/14/2015   Pain of left foot 08/16/2014   Health care maintenance 08/16/2014   Other specified symptoms and signs involving the digestive system and abdomen 07/16/2014   Mastodynia 04/06/2013   Acute vaginitis 02/09/2013   Supraventricular tachycardia (HCC) 08/27/2012   Stress 08/27/2012   Other nonmedicinal substance allergy status 04/07/2012   Gastro-esophageal reflux disease without esophagitis 04/07/2012    REFERRING DIAG: 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 This patient's chief complaints consist of L anterior/lateral hip and anterior thigh pain with extended walking and shag dancing, and left groin pain with rotational movements (not as bad) leading to the following functional deficits: difficulty with usual activities such as walking, dancing, exercising, squatting down, moving into figure 4 position, putting on shoes, walking at work. Relevant past medical history and comorbidities include anxiety, arthritis, chronic back pain (lumbar surgery 2015), deviates septum, GERD, scoliosis,  SVT (ablation 2022), L THA anterior approach (10/13/2021, Dr. Rosita Kea), allergies, left foot pain, hearing loss, leukocytosis, osteoporosis.  Patient denies hx of cancer, stroke, seizures, lung problems, diabetes, unexplained weight loss, unexplained changes in bowel or bladder problems, paresthesia, and unexplained stumbling or dropping things.   PRECAUTIONS: osteoporosis  SUBJECTIVE:                                                                                                                                                                                      SUBJECTIVE STATEMENT:  Patient reports she was okay after last PT session. Her left hip has been okay. She does not pay attention to it much any more. It does pull when she is walking up a hill towards her car. Not as bad as it used to be.   PAIN:  NPRS: 0/10   OBJECTIVE  TODAY'S TREATMENT:  Therapeutic exercise: to centralize symptoms and improve ROM, strength, muscular endurance, and activity tolerance required for successful completion of functional activities.  - Treadmill 5 mph at 0% grade with intermittent UE support. For improved lower extremity mobility, muscular endurance, and weightbearing activity tolerance; and to induce the analgesic effect of aerobic exercise, stimulate improved joint nutrition, and prepare body structures and systems for following interventions. x 5 minutes. Required min assistance to set up machine.  Mild pain in left groin after the first minute that progressed as time went on followed by starting lateral L hip pain.  - step up to 12 inch step, UE support available as needed, foot stays on step, 3x10 each foot.   - BOSU squats on flat side, 1x10 with B UE support, 3x10 with hands hovering over TM bar.  - hip hikes on 2x4 with U UE support, 3x15 each side with 7.5# AW. - standing hip flexion knee repeater with opposite foot on 6 inch step and UE support, 2x10 each side with 7.5#AW. (limited by L knee  discomfort with weight bearing).  - single leg stance ball toss, airex pad, 3x20 each foot with small pink theraball.  - standing lateral lunge on slider (flexed knee on  slider). 1x10 each side with B UE support.   Pt required multimodal cuing for proper technique and to facilitate improved neuromuscular control, strength, range of motion, and functional ability resulting in improved performance and form.    PATIENT EDUCATION:  Education details:  Exercise purpose/form. Self management techniques. Education on HEP including handout.  Dry needling Reviewed cancelation/no-show policy with patient and confirmed patient has correct phone number for clinic; patient verbalized understanding (08/22/22). Person educated: Patient Education method: Explanation Education comprehension: verbalized understanding and needs further education   HOME EXERCISE PROGRAM: Access Code: [email or web address] URL: [email or web address] Date: 08/28/2022 Prepared by: Norton Blizzard  Exercises - Hip Hikes off step  - 1 x daily - 3-5 x weekly - 3 sets - 15 reps - Supine Hip Flexion with Resistance Loop  - 3-5 x weekly - 3 sets - 10 reps - Single Leg Bridge  - 3-5 x weekly - 3 sets - 10 reps - Staggered Sit-to-Stand  - 1 x daily - 3 sets - 10 reps  HOME EXERCISE PROGRAM [UESMPYT] View at [email or web address]" using code: UESMPYT Windmill -  Repeat 10 Repetitions, Complete 3 Sets, Perform 3 Times a Week   ASSESSMENT:   CLINICAL IMPRESSION: Patient arrives reporting good tolerance to last PT session and HEP since last PT session. She was able to progress exercises today without increased pain, although hip hike exercise was stopped sooner than planned due to L knee discomfort with weightbearing that resolved with rest. Plan to complete 10th visit progress note and assess for readiness for discharge next session. Patient would benefit from continued management of limiting condition by skilled physical therapist to  address remaining impairments and functional limitations to work towards stated goals and return to PLOF or maximal functional independence.    From initial PT Eval 08/22/2022:  Patient is a 67 y.o. 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 Patient has significant weakness with reproduction of lateral and anterior hip pain with resisted hip flexion and hip abduction and reproduction of deep groin pain with palpation to proximal hip adductors and FADDIR test. Patient has strength deficits in L hip flexion, abduction, and adduction. She has some end range stiffness with mild ROM deficits in L hip flexion and ER and demonstrates shortened rectus femoris on L compared to right. 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 She also has some apprehension about use of left hip that would benefit from LE and functional strengthening and ROM exercises paired with reassurance and education. Patient presents with significant pain, ROM, joint stiffness, muscle tension, knowledge, muscle performance (strength/power/endurance) and activity tolerance  impairments that are limiting ability to complete her usual activities such as walking, dancing, exercising, squatting down, moving into figure 4 position, putting on shoes, walking at work without difficulty. Patient will benefit from skilled physical therapy intervention to address current body structure impairments and activity limitations to improve function and work towards goals set in current POC in order to return to prior level of function or maximal functional improvement.    OBJECTIVE IMPAIRMENTS: decreased activity tolerance, decreased endurance, decreased knowledge of condition, decreased mobility,  difficulty walking, decreased ROM, decreased strength, hypomobility, impaired perceived functional ability, increased muscle spasms, impaired flexibility, and pain.    ACTIVITY LIMITATIONS: squatting, walking, dressing, exercising, dancing,  and locomotion level.   PARTICIPATION LIMITATIONS: community activity, occupation, and   difficulty with usual activities such as walking, dancing, exercising, squatting down, moving into figure 4 position, putting on shoes, walking at work   PERSONAL FACTORS: Past/current experiences, Time since onset of injury/illness/exacerbation, and 3+ comorbidities:   anxiety, arthritis, chronic back pain (lumbar surgery 2015), deviates septum, GERD, history of migraines, scoliosis, SVT (ablation 2022), L THA anterior approach (10/13/2021, Dr. Rosita Kea), allergies, left foot pain, hearing loss, leukocytosis, osteoporosis are also affecting patient's functional outcome.    REHAB POTENTIAL: Good   CLINICAL DECISION MAKING: Evolving/moderate complexity   EVALUATION COMPLEXITY: Moderate     GOALS: Goals reviewed with patient? No   SHORT TERM GOALS: Target date: 09/05/2022   Patient will be independent with initial home exercise program for self-management of symptoms. Baseline: Initial HEP to be provided at visit 2 as appropriate (08/22/22); Goal status: In-progress     LONG TERM GOALS: Target date: 11/14/2022   Patient will be independent with a long-term home exercise program for self-management of symptoms.  Baseline: Initial HEP to be provided at visit 2 as appropriate (08/22/22); Goal status: In-progress   2.  Patient will demonstrate improved FOTO b7 equal or greater than 10 points by visit #12 to demonstrate improvement in overall condition and self-reported functional ability.  Baseline: to be measured at visit 2 as appropriate (08/22/22); Goal status: In-progress   3.  Patient will demonstrate 4+/5 strength in L hip flexion, abduction, and adduction without  increase in symptoms to improve her ability to walk and dance without limitation. Baseline: weak and painful - see objective exam (08/22/22); Goal status: In-progress   4.  Patient will report decrease in pain during functional activities to equal or less than 3/10 to improve her ability to participate in dancing and walking activities.  Baseline: up to 10/10 (08/22/22); Goal status: In-progress   5.  Patient will complete community, work and/or recreational activities with 75% less limitation due to current condition.  Baseline: difficulty with usual activities such as walking, dancing, exercising, squatting down, moving into figure 4 position, putting on shoes, walking at work (08/22/22); Goal status: In-progress         PLAN:   PT FREQUENCY: 1-2x/week   PT DURATION: 12 weeks   PLANNED INTERVENTIONS: Therapeutic exercises, Therapeutic activity, Neuromuscular re-education, Balance training, Gait training, Patient/Family education, Self Care, Joint mobilization, Aquatic Therapy, Dry Needling, Electrical stimulation, Spinal mobilization, Cryotherapy, Moist heat, Manual therapy, and Re-evaluation   PLAN FOR NEXT SESSION: Update HEP as appropriate, progressive hip/LE/functional strengthening, tolerated loading through L hip abductors, adductors, and hip flexors to stimulate tendon remodeling, ROM and stretching as needed to improve stiffness, manual therapy and dry needling as needed, education.     Cira Rue, PT, DPT 10/04/2022, 7:54 PM  [facility] [address] [place], [place] [address] P: [phone] I F: [phone]

## 2022-10-05 ENCOUNTER — Telehealth: Payer: Self-pay | Admitting: Physical Therapy

## 2022-10-05 NOTE — Telephone Encounter (Signed)
Called patient back after she called and asked about discharge. Patient reports she feels like her hip is not going to get much better and she feels able to continue with her HEP to help it continue to get better without PT supervision. Patient requested a copy of her HEP which was emailed to her and final FOTO score was completed over the phone. She states her knee felt fine after her PT appointment yesterday.    Luretha Murphy. Ilsa Iha, PT, DPT 10/05/22, 6:54 PM  [facility] [address] [place], [place] [address] P: [phone] I F: [phone]

## 2022-10-09 ENCOUNTER — Ambulatory Visit: Payer: Medicare Other | Admitting: Physical Therapy

## 2022-10-11 ENCOUNTER — Encounter: Payer: BC Managed Care – PPO | Admitting: Physical Therapy

## 2022-10-16 ENCOUNTER — Encounter: Payer: BC Managed Care – PPO | Admitting: Physical Therapy

## 2022-10-18 ENCOUNTER — Encounter: Payer: BC Managed Care – PPO | Admitting: Physical Therapy

## 2022-10-21 ENCOUNTER — Other Ambulatory Visit: Payer: Self-pay | Admitting: Internal Medicine

## 2022-10-21 DIAGNOSIS — Z79899 Other long term (current) drug therapy: Secondary | ICD-10-CM

## 2022-10-23 ENCOUNTER — Other Ambulatory Visit: Payer: Self-pay

## 2022-10-23 ENCOUNTER — Telehealth: Payer: Self-pay | Admitting: Cardiovascular Disease

## 2022-10-23 ENCOUNTER — Emergency Department: Payer: Medicare Other

## 2022-10-23 ENCOUNTER — Emergency Department
Admission: EM | Admit: 2022-10-23 | Discharge: 2022-10-23 | Disposition: A | Discharge status: Home / Self Care | Payer: Medicare Other | Attending: Emergency Medicine | Admitting: Emergency Medicine

## 2022-10-23 ENCOUNTER — Encounter: Payer: BC Managed Care – PPO | Admitting: Physical Therapy

## 2022-10-23 DIAGNOSIS — R0789 Other chest pain: Secondary | ICD-10-CM | POA: Insufficient documentation

## 2022-10-23 LAB — HEPATIC FUNCTION PANEL
ALT: 15 U/L (ref 0–44)
AST: 19 U/L (ref 15–41)
Albumin: 4.3 g/dL (ref 3.5–5.0)
Alkaline Phosphatase: 72 U/L (ref 38–126)
Bilirubin, Direct: <0.1 mg/dL (ref 0.0–0.2)
Total Bilirubin: 0.8 mg/dL (ref 0.3–1.2)
Total Protein: 7.1 g/dL (ref 6.5–8.1)

## 2022-10-23 LAB — BASIC METABOLIC PANEL
Anion gap: 8 (ref 5–15)
BUN: 18 mg/dL (ref 8–23)
CO2: 25 mmol/L (ref 22–32)
Calcium: 9.3 mg/dL (ref 8.9–10.3)
Chloride: 105 mmol/L (ref 98–111)
Creatinine, Ser: 0.64 mg/dL (ref 0.44–1.00)
GFR, Estimated: >60 mL/min (ref >60)
Glucose, Bld: 96 mg/dL (ref 70–99)
Potassium: 3.9 mmol/L (ref 3.5–5.1)
Sodium: 138 mmol/L (ref 135–145)

## 2022-10-23 LAB — CBC
HCT: 44 % (ref 36.0–46.0)
Hemoglobin: 14.4 g/dL (ref 12.0–15.0)
MCH: 31.8 pg (ref 26.0–34.0)
MCHC: 32.7 g/dL (ref 30.0–36.0)
MCV: 97.1 fL (ref 80.0–100.0)
Platelets: 218 10*3/uL (ref 150–400)
RBC: 4.53 MIL/uL (ref 3.87–5.11)
RDW: 11.9 % (ref 11.5–15.5)
WBC: 6.7 10*3/uL (ref 4.0–10.5)
nRBC: 0 % (ref 0.0–0.2)

## 2022-10-23 LAB — TROPONIN I (HIGH SENSITIVITY)
Troponin I (High Sensitivity): 3 ng/L (ref <18)
Troponin I (High Sensitivity): 3 ng/L (ref <18)

## 2022-10-23 LAB — LIPASE, BLOOD: Lipase: 46 U/L (ref 11–51)

## 2022-10-23 MED ORDER — KETOROLAC TROMETHAMINE 30 MG/ML IJ SOLN
15.0000 mg | Freq: Once | INTRAMUSCULAR | Status: DC
  Filled 2022-10-23: qty 1

## 2022-10-23 NOTE — Telephone Encounter (Signed)
Pt c/o of Chest Pain: STAT if CP now or developed within 24 hours  1. Are you having CP right now? No  2. Are you experiencing any other symptoms (ex. SOB, nausea, vomiting, sweating)? No  3. How long have you been experiencing CP? Yesterday  4. Is your CP continuous or coming and going? Come and go  5. Have you taken Nitroglycerin? No ?  Pt's significant other states that pt has been experiencing CP since last night. He stated that he will be taking pt to the ED. Please advise

## 2022-10-23 NOTE — ED Notes (Signed)
Patient ambulatory to restroom down the hallway from room. Pt's husband accompanied her to the bathroom.

## 2022-10-23 NOTE — Telephone Encounter (Signed)
Patients spouse reports that patient had chest pain since last night. Currently she is in the ED for evaluation. He reports checking her blood  pressure earlier this morning and it was 146/84 with heart rate of 55. He stated that she was in room 14 and wanted Dr. Mariah Milling & Dr. Lalla Brothers to be aware. Advised that I would forward to his providers per his request and that we would call him back with any further recommendations.

## 2022-10-23 NOTE — ED Triage Notes (Signed)
Pt to ED for left sided chest pain intermittent started on Saturday. Pain radiates to left arm. Denies n/v/shob.  Reports hx of ablation for SVT.

## 2022-10-23 NOTE — Discharge Instructions (Signed)
Take tylenol or ibuprofen for pain  Return to the ER as needed  Follow-up with Dr. Mariah Milling in the next 1-2 weeks

## 2022-10-23 NOTE — ED Provider Notes (Signed)
[facility] Provider Note    Event Date/Time   First MD Initiated Contact with Patient 10/23/22 [phone]     (approximate)   History   No chief complaint on file.   HPI  Theresa Buck is a 67 y.o. female  here with chest pain. Pt reports that over the last day, she has had intermittent sharp, left-sided parasternal chest pain. Pain comes and goes randomly, lasts only a few minutes, then goes away. No exertional component. No SOB. No nausea, vomiting. No other complaints. H/o SVT with ablation, follows with Dr. Mariah Milling. No recent leg swelling. No recent immobilization. No pleuritic component.       Physical Exam   Triage Vital Signs: ED Triage Vitals  Enc Vitals Group     BP 10/23/22 0908 (!) 146/84     Pulse Rate 10/23/22 0908 64     Resp 10/23/22 0908 18     Temp 10/23/22 0908 97.8 F (36.6 C)     Temp src --      SpO2 10/23/22 0908 100 %     Weight 10/23/22 0904 144 lb (65.3 kg)     Height 10/23/22 0904 5\' 6"  (1.676 m)     Head Circumference --      Peak Flow --      Pain Score 10/23/22 0904 2     Pain Loc --      Pain Edu? --      Excl. in GC? --     Most recent vital signs: Vitals:   10/23/22 1225 10/23/22 1230  BP:    Pulse: 65 (!) 57  Resp: 18 17  Temp:    SpO2: 100% 100%     General: Awake, no distress.  CV:  Good peripheral perfusion. RRR.  Resp:  Normal work of breathing. Lungs CTAB. Abd:  No distention. Lungs clear to auscultation bilaterally. Other:     ED Results / Procedures / Treatments   Labs (all labs ordered are listed, but only abnormal results are displayed) Labs Reviewed  BASIC METABOLIC PANEL  CBC  HEPATIC FUNCTION PANEL  LIPASE, BLOOD  TROPONIN I (HIGH SENSITIVITY)  TROPONIN I (HIGH SENSITIVITY)     EKG Normal sinus rhythm, VR 69. PR 164, QRS 128, QTc 445. No acute ST elevations. NO ischemia or infarct.   RADIOLOGY CXR: Clear   I also independently reviewed and agree with radiologist  interpretations.   PROCEDURES:  Critical Care performed: No  .1-3 Lead EKG Interpretation  Performed by: Shaune Pollack, MD Authorized by: Shaune Pollack, MD     Interpretation: normal     ECG rate:  60-70   ECG rate assessment: normal     Rhythm: sinus rhythm     Ectopy: none     Conduction: normal   Comments:     Indication: Chest pain     MEDICATIONS ORDERED IN ED: Medications - No data to display   IMPRESSION / MDM / ASSESSMENT AND PLAN / ED COURSE  I reviewed the triage vital signs and the nursing notes.                              Differential diagnosis includes, but is not limited to, MSK chest pain, costochondritis, atypical chest pain, angina, ACS, PE, PNA, PTX  Patient's presentation is most consistent with acute presentation with potential threat to life or bodily function.  The patient is on the cardiac  monitor to evaluate for evidence of arrhythmia and/or significant heart rate changes  67 yo F with PMHx SVT sp ablation here with atypical chest pain. Pain is transient, atypical in nature, suspect MSK or noncardiac pain. EKG is nonischemic, and trop neg x 2 - do not suspect ACS. She was monitored on tele x several hours without ectopy or arrhythmia. CXR is clear, no signs of PNA, PTX, CHF, or other abnormality.  CBC without leukocytosis. BMP unremarkable. Pt does have a h/o SVT requiring ablation, but no signs today to suggest arrhythmia contributing to her sx. Will refer her to her Cardiologist for outpt follow-up, encourage hydration, and d/c home. No apparent emergent pathology at this time. She has no hypoxia, tachypnea, tachycardia, or si/s PE or dissection.    FINAL CLINICAL IMPRESSION(S) / ED DIAGNOSES   Final diagnoses:  Atypical chest pain     Rx / DC Orders   ED Discharge Orders     None        Note:  This document was prepared using Dragon voice recognition software and may include unintentional dictation errors.   Shaune Pollack,  MD 10/23/22 [phone]

## 2022-10-24 ENCOUNTER — Telehealth: Payer: Self-pay

## 2022-10-24 NOTE — Transitions of Care (Post Inpatient/ED Visit) (Signed)
Unable to reach pt by phone and left v/m requesting cb [phone].      10/24/2022  Name: Theresa Buck MRN: [id number] DOB: 06-12-1955  Today's TOC FU Call Status: Today's TOC FU Call Status:: Unsuccessul Call (1st Attempt) Unsuccessful Call (1st Attempt) Date: 10/24/22  Attempted to reach the patient regarding the most recent Inpatient/ED visit.  Follow Up Plan: Additional outreach attempts will be made to reach the patient to complete the Transitions of Care (Post Inpatient/ED visit) call.   Signature  Lewanda Rife, LPN

## 2022-10-25 ENCOUNTER — Encounter: Payer: BC Managed Care – PPO | Admitting: Physical Therapy

## 2022-10-27 NOTE — Telephone Encounter (Signed)
Patient returned call, verified contact info. Patient states mobile number is the best way to contact her again.

## 2022-10-27 NOTE — Transitions of Care (Post Inpatient/ED Visit) (Signed)
Unable to reqach pt by phone and left v/m requesting cb [phone].       10/27/2022  Name: Theresa Buck MRN: [id number] DOB: 10/04/1955  Today's TOC FU Call Status: Today's TOC FU Call Status:: Unsuccessful Call (2nd Attempt) Unsuccessful Call (1st Attempt) Date: 10/24/22 Unsuccessful Call (2nd Attempt) Date: 10/27/22  Attempted to reach the patient regarding the most recent Inpatient/ED visit.  Follow Up Plan: Additional outreach attempts will be made to reach the patient to complete the Transitions of Care (Post Inpatient/ED visit) call.   Signature  Lewanda Rife, LPN

## 2022-10-27 NOTE — Telephone Encounter (Signed)
Patient states she is fine since ED visit

## 2022-10-27 NOTE — Transitions of Care (Post Inpatient/ED Visit) (Signed)
I returned pt call but left v/m requesting pt to cb [phone].     10/27/2022  Name: Theresa Buck MRN: [id number] DOB: May 11, 1955  Today's TOC FU Call Status: Today's TOC FU Call Status:: Unsuccessful Call (2nd Attempt) Unsuccessful Call (1st Attempt) Date: 10/24/22 Unsuccessful Call (2nd Attempt) Date: 10/27/22  Attempted to reach the patient regarding the most recent Inpatient/ED visit.  Follow Up Plan: Additional outreach attempts will be made to reach the patient to complete the Transitions of Care (Post Inpatient/ED visit) call.   Signature Lewanda Rife, LPN

## 2022-10-30 ENCOUNTER — Encounter: Payer: BC Managed Care – PPO | Admitting: Physical Therapy

## 2022-10-30 ENCOUNTER — Telehealth: Payer: Self-pay | Admitting: Internal Medicine

## 2022-10-30 ENCOUNTER — Telehealth: Payer: Self-pay | Admitting: Cardiovascular Disease

## 2022-10-30 NOTE — Telephone Encounter (Signed)
Please call and confirm she is doing ok.  Confirm if any symptoms currently.

## 2022-10-30 NOTE — Transitions of Care (Post Inpatient/ED Visit) (Signed)
I spoke with pt and pt was seen [facility] ED on 10/23/22 and pt said she has been fdoing well since left ED; no CP. pt said she tried last wk to schedule appt with Dr Mariah Milling but did not receive cb from Dr Ethelene Hal office,. did warm transfer to Dr Windell Hummingbird oiffice today and spoke with Fleet Contras. Sending note to Dr Dale Dawson.    10/30/2022  Name: Theresa Buck MRN: [id number] DOB: Nov 28, 1955  Today's TOC FU Call Status: Today's TOC FU Call Status:: Successful TOC FU Call Competed Unsuccessful Call (1st Attempt) Date: 10/24/22 Unsuccessful Call (2nd Attempt) Date: 10/27/22 [facility] FU Call Complete Date: 10/30/22  Transition Care Management Follow-up Telephone Call Date of Discharge: 10/23/22 Discharge Facility: [facility]) Type of Discharge: Emergency Department Reason for ED Visit: Cardiac Conditions (chest pain) How have you been since you were released from the hospital?: Better Any questions or concerns?: No  Items Reviewed: Did you receive and understand the discharge instructions provided?: Yes Medications obtained,verified, and reconciled?: Yes (Medications Reviewed) Any new allergies since your discharge?: No Dietary orders reviewed?: NA Do you have support at home?: Yes People in Home: friend(s) Name of Support/Comfort Primary Source: Isaias Cowman  Medications Reviewed Today: Medications Reviewed Today     Reviewed by Cira Rue, PT (Physical Therapist) on 10/04/22 at 1927  Med List Status: <None>   Medication Order Taking? Sig Documenting Provider Last Dose Status Informant  Biotin 5000 MCG TABS [id number] No Take 5,000 mcg by mouth daily. [provider] Taking Active Self  Calcium Carb-Cholecalciferol (CALCIUM + D3 PO) [id number] No Take 1 tablet daily by mouth. [provider] Taking Active Self  carboxymethylcellulose (REFRESH PLUS) 0.5 % SOLN [id number] No Place 1 drop into both eyes 3 (three) times daily as needed (dry eyes). [provider] Taking Active Self  clobetasol cream (TEMOVATE) 0.05 % [id number] No Apply to bumps on right side twice daily until improved. Avoid face, groin, axilla. Willeen Niece, MD Taking Active   estradiol (ESTRACE) 0.1 MG/GM vaginal cream [id number] No INSERT 1 APPLICATORFUL VAGINALLY DAILY Dale [place], MD Taking Active   estradiol (ESTRACE) 1 MG tablet [id number] No TAKE 1/2 TABLET(0.5 MG) BY MOUTH DAILY Dale [place], MD Taking Active   hydrocortisone (ANUSOL-HC) 25 MG suppository [id number] No Place 1 suppository (25 mg total) rectally 2 (two) times daily. Dale [place], MD Taking Active Self  metoprolol succinate (TOPROL-XL) 25 MG 24 hr tablet [id number] No TAKE 1 TABLET(25 MG) BY MOUTH DAILY WITH OR IMMEDIATELY FOLLOWING A MEAL Gollan, Tollie Pizza, MD Taking Active   mometasone (ELOCON) 0.1 % lotion [id number] No Apply to affected area scalp once to twice daily until improved. Willeen Niece, MD Taking Active   montelukast (SINGULAIR) 10 MG tablet [id number] No Take 10 mg at bedtime by mouth.  [provider] Taking Active Self  Multiple Vitamin (MULTIVITAMIN) tablet [id number] No Take 1 tablet by mouth daily. [provider] Taking Active Self  pantoprazole (PROTONIX) 20 MG tablet [id number]  Take 1 tablet (20 mg total) by mouth daily as needed. Dale [place], MD  Active   pantoprazole (PROTONIX) 40 MG tablet [id number] No TAKE 1 TABLET BY MOUTH EVERY DAY Dale [place], MD Taking Active   Probiotic Product (PROBIOTIC ADVANCED PO) [id number] No Take 1 capsule daily by mouth.  [provider] Taking Active Self  progesterone (PROMETRIUM) 100 MG capsule [id number] No Take 100 mg daily by mouth. [provider] Taking Active Self  progesterone (PROMETRIUM) 100 MG  capsule [id number] No Take 1 capsule (100 mg total) by mouth daily. Dale [place], MD Taking Active   traZODone (DESYREL) 50 MG tablet [id number] No TAKE 1/2 TO 1 TABLET(25 TO 50 MG) BY MOUTH AT BEDTIME  AS NEEDED FOR SLEEP Dale [place], MD Taking Active   tretinoin (RETIN-A) 0.05 % cream [id number] No Apply a small amount to affected areas face every night as tolerated. Willeen Niece, MD Taking Active   vitamin C (ASCORBIC ACID) 500 MG tablet [id number] No Take 500 mg by mouth daily. [provider] Taking Active Self            Home Care and Equipment/Supplies: Were Home Health Services Ordered?: NA Any new equipment or medical supplies ordered?: NA  Functional Questionnaire: Do you need assistance with bathing/showering or dressing?: No Do you need assistance with meal preparation?: No Do you need assistance with eating?: No Do you have difficulty maintaining continence: No Do you need assistance with getting out of bed/getting out of a chair/moving?: No Do you have difficulty managing or taking your medications?: No  Follow up appointments reviewed: PCP Follow-up appointment confirmed?: NA Specialist Hospital Follow-up appointment confirmed?: No Reason Specialist Follow-Up Not Confirmed: TOC Calling Clinician Notified Provider Practice of Needed Appointment (pt said she tried last wk to schedule appt with Dr Mariah Milling but did not receive cb from Dr Ethelene Hal office,. did warm transfer to Dr Windell Hummingbird oiffice today and spoke with Fleet Contras.) Do you need transportation to your follow-up appointment?: No Do you understand care options if your condition(s) worsen?: Yes-patient verbalized understanding    SIGNATURE Lewanda Rife, LPN

## 2022-10-30 NOTE — Telephone Encounter (Signed)
Facility tried to transfer the pt over to schedule a appointment. Waited on hold for 6 minutes and no one answered and I had to disconnect.

## 2022-10-30 NOTE — Telephone Encounter (Signed)
Pt returning call to cma 

## 2022-10-30 NOTE — Telephone Encounter (Signed)
LMTCB

## 2022-10-31 NOTE — Telephone Encounter (Signed)
See other note

## 2022-10-31 NOTE — Telephone Encounter (Signed)
Called and confirmed patient is doing ok. No symptoms currently

## 2022-11-01 ENCOUNTER — Encounter: Payer: BC Managed Care – PPO | Admitting: Physical Therapy

## 2022-12-06 NOTE — Progress Notes (Signed)
Cardiology Office Note  Date:  12/08/2022   ID:  Theresa Buck, DOB 21-Nov-1955, MRN [id number]  PCP:  Dale Turkey, MD   Chief Complaint  Patient presents with   Follow-up    Concerned with right inner thigh sharp pain for 3 weeks.  The pain makes it difficult to walk and rest.  Also new intermittent chest pain.      HPI:  Theresa Buck is a 67 y.o. female with a hx of  paroxysmal tachycardia dating back 10 years,  Previously seen in 2014 for paroxysmal tachycardia, having severe episodes at that time, lost to follow-up With continued episodes and recent presentation to the hospital March 2022 EKG documented wide-complex tachycardia concerning for SVT With associated diaphoresis, near syncope, breaking on its own Echo with ejection fraction 45 to 50% in March 2022 Cardiac CTA April 2022 no significant coronary disease, calcium score of 12 Who presents for follow-up of her SVT, status post ablation, cardiomyopathy  Last seen in clinic by myself July 2023 In follow-up today reports feeling relatively well Over the past several weeks has appreciated tenderness right thigh region, sometimes shooting discomfort Denies any trauma to the area, no aggressive exercise program that would have contributed to injury  10/23/22: Seen in the emergency room for chest pain Cardiac workup negative No further significant chest pain symptoms  Denies significant tachypalpitations concerning for arrhythmia Taking metoprolol succinate 12.5 daily  EKG personally reviewed by myself on todays visit EKG Interpretation Date/Time:  Friday December 08 2022 16:12:41 EDT Ventricular Rate:  68 PR Interval:  150 QRS Duration:  134 QT Interval:  432 QTC Calculation: 459 R Axis:   -43  Text Interpretation: Normal sinus rhythm Left axis deviation Left ventricular hypertrophy with QRS widening and repolarization abnormality ( Cornell product ) Cannot rule out Anteroseptal infarct (cited on or before  23-Oct-2022) When compared with ECG of 23-Oct-2022 09:05, Premature ventricular complexes are no longer Present Serial changes of Anteroseptal infarct Present Confirmed by Julien Nordmann [phone]) on 12/08/2022 4:44:57 PM   Other past medical history reviewed Suffered a fall October 12, 2021, taken by EMS to the ER for left hip pain X-ray showing displaced left femoral neck fracture, underwent surgery, discharged home June 24 No complications noted EKG  in the hospital with concern for new left bundle branch block reported  Seen in the emergency room May 16, 2021 for tachypalpitations  Echocardiogram 06/17/2021 ejection fraction 55 to 60% Ejection fraction increased from 45 to 50% in 2022  Seen by EP March 2023 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  PSH:    Past Surgical History:  Procedure Laterality Date   BACK SURGERY  09/26/2013   LUMBAR   COLONOSCOPY  08/03/2015   Dr Mechele Collin   HEMORRHOID SURGERY N/A 03/13/2017   Procedure: HEMORRHOIDECTOMY;  Surgeon: Kieth Brightly, MD;  Location: [facility] ORS;  Service: General;  Laterality: N/A;   SVT ABLATION N/A 09/16/2020   Procedure: SVT ABLATION;  Surgeon: Lanier Prude, MD;  Location: [facility] INVASIVE CV LAB;  Service: Cardiovascular;  Laterality: N/A;   TOTAL HIP ARTHROPLASTY Left 10/13/2021   Procedure: TOTAL HIP ARTHROPLASTY ANTERIOR APPROACH;  Surgeon: Kennedy Bucker, MD;  Location: [facility] ORS;  Service: Orthopedics;  Laterality: Left;  UPPER GI ENDOSCOPY  08/03/2015   Dr Mechele Collin    Current Outpatient Medications  Medication Sig Dispense Refill   Biotin 5000 MCG TABS Take 5,000 mcg by mouth daily.     Calcium  Carb-Cholecalciferol (CALCIUM + D3 PO) Take 1 tablet daily by mouth.     carboxymethylcellulose (REFRESH PLUS) 0.5 % SOLN Place 1 drop into both eyes 3 (three) times daily as needed (dry eyes).     clobetasol cream (TEMOVATE) 0.05 % Apply to bumps on right side twice daily until improved. Avoid face, groin, axilla. 30 g 0   estradiol (ESTRACE) 0.1 MG/GM vaginal cream INSERT 1 APPLICATORFUL VAGINALLY DAILY 42.5 g 1   estradiol (ESTRACE) 1 MG tablet TAKE 1/2 TABLET(0.5 MG) BY MOUTH DAILY 45 tablet 1   hydrocortisone (ANUSOL-HC) 25 MG suppository Place 1 suppository (25 mg total) rectally 2 (two) times daily. 20 suppository 0   metoprolol succinate (TOPROL-XL) 50 MG 24 hr tablet Take 25 mg by mouth daily. 1/2 tablet every day Take with or immediately following a meal.     mometasone (ELOCON) 0.1 % lotion Apply to affected area scalp once to twice daily until improved. 60 mL 1   montelukast (SINGULAIR) 10 MG tablet Take 10 mg at bedtime by mouth.   1   Multiple Vitamin (MULTIVITAMIN) tablet Take 1 tablet by mouth daily.     pantoprazole (PROTONIX) 20 MG tablet Take 1 tablet (20 mg total) by mouth daily as needed. 90 tablet 1   Probiotic Product (PROBIOTIC ADVANCED PO) Take 1 capsule daily by mouth.      progesterone (PROMETRIUM) 100 MG capsule TAKE 1 CAPSULE(100 MG) BY MOUTH DAILY 30 capsule 3   traZODone (DESYREL) 50 MG tablet TAKE 1/2 TO 1 TABLET(25 TO 50 MG) BY MOUTH AT BEDTIME AS NEEDED FOR SLEEP 30 tablet 1   vitamin C (ASCORBIC ACID) 500 MG tablet Take 500 mg by mouth daily.     No current facility-administered medications for this visit.    Allergies:   Ceftin [cefuroxime axetil]   Social History:  The patient  reports that she has never smoked. She has never used smokeless tobacco. She reports that she does not drink alcohol and does not use drugs.   Family History:   family history includes ALS in her father; Brain cancer in her mother; Diabetes in her brother; Heart disease in her son;  Throat cancer in her brother.   Review of Systems: Review of Systems  Constitutional: Negative.   HENT: Negative.    Respiratory: Negative.    Cardiovascular: Negative.   Gastrointestinal: Negative.   Musculoskeletal: Negative.   Neurological: Negative.   Psychiatric/Behavioral: Negative.    All other systems reviewed and are negative.   PHYSICAL EXAM: VS:  BP 128/72 (BP Location: Left Arm, Patient Position: Sitting, Cuff Size: Normal)   Pulse 98   Ht 5\' 6"  (1.676 m)   Wt 145 lb 3.2 oz (65.9 kg)   LMP 07/15/2012   SpO2 (!) 68%   BMI 23.44 kg/m  , BMI Body mass index is 23.44 kg/m. Constitutional:  oriented to person, place, and time. No distress.  HENT:  Head: Grossly normal Eyes:  no discharge. No scleral icterus.  Neck: No JVD, no carotid bruits  Cardiovascular: Regular rate and rhythm, no murmurs appreciated Pulmonary/Chest: Clear to auscultation bilaterally, no wheezes or rails Abdominal: Soft.  no distension.  no tenderness.  Musculoskeletal: Normal range of motion Neurological:  normal muscle tone. Coordination normal. No atrophy Skin: Skin  warm and dry Psychiatric: normal affect, pleasant  Recent Labs: 09/01/2022: TSH 1.57 10/23/2022: ALT 15; BUN 18; Creatinine, Ser 0.64; Hemoglobin 14.4; Platelets 218; Potassium 3.9; Sodium 138    Lipid Panel Lab Results  Component Value Date   CHOL 187 09/01/2022   HDL 53.60 09/01/2022   LDLCALC 119 (H) 09/01/2022   TRIG 74.0 09/01/2022      Wt Readings from Last 3 Encounters:  12/08/22 145 lb 3.2 oz (65.9 kg)  10/23/22 144 lb (65.3 kg)  09/01/22 144 lb 9.6 oz (65.6 kg)     ASSESSMENT AND PLAN:  Problem List Items Addressed This Visit       Cardiology Problems   LBBB (left bundle branch block)   Relevant Medications   metoprolol succinate (TOPROL-XL) 50 MG 24 hr tablet   Other Visit Diagnoses     SVT (supraventricular tachycardia)    -  Primary   Relevant Medications   metoprolol succinate (TOPROL-XL) 50  MG 24 hr tablet   Other Relevant Orders   EKG 12-Lead (Completed)   AVNRT (AV nodal re-entry tachycardia)       Relevant Medications   metoprolol succinate (TOPROL-XL) 50 MG 24 hr tablet   Palpitations       Nonischemic cardiomyopathy (HCC)       Relevant Medications   metoprolol succinate (TOPROL-XL) 50 MG 24 hr tablet   Dilated cardiomyopathy (HCC)       Relevant Medications   metoprolol succinate (TOPROL-XL) 50 MG 24 hr tablet   PVC (premature ventricular contraction)       Relevant Medications   metoprolol succinate (TOPROL-XL) 50 MG 24 hr tablet      Wide-complex tachycardia/SVT Status post ablation Minimal arrhythmia Continue metoprolol succinate 12.5 daily Followed by Dr. Lalla Brothers  Left bundle branch block New finding on EKG following hip fracture No ischemic work-up needed, cardiac CTA April 2022 with no significant disease Denies significant ischemic symptoms  Cardiomyopathy/depressed ejection fraction Ejection fraction has improved on repeat study No significant coronary disease  PVCs Appears to be RV outflow tract based Asymptomatic, on metoprolol succinate Continue metoprolol succinate 12.5 daily  Right groin discomfort Likely musculoskeletal, hot packs recommended   Total encounter time more than 30 minutes  Greater than 50% was spent in counseling and coordination of care with the patient   Signed, Dossie Arbour, M.D., Ph.D. [facility] [place], [place] [phone]

## 2022-12-07 ENCOUNTER — Encounter (INDEPENDENT_AMBULATORY_CARE_PROVIDER_SITE_OTHER): Payer: Self-pay

## 2022-12-08 ENCOUNTER — Encounter: Payer: Self-pay | Admitting: Cardiovascular Disease

## 2022-12-08 ENCOUNTER — Ambulatory Visit: Payer: BC Managed Care – PPO | Admitting: Cardiovascular Disease

## 2022-12-08 VITALS — BP 128/72 | HR 98 | Ht 66.0 in | Wt 145.2 lb

## 2022-12-08 DIAGNOSIS — I493 Ventricular premature depolarization: Secondary | ICD-10-CM

## 2022-12-08 DIAGNOSIS — I428 Other cardiomyopathies: Secondary | ICD-10-CM | POA: Diagnosis not present

## 2022-12-08 DIAGNOSIS — R002 Palpitations: Secondary | ICD-10-CM | POA: Diagnosis not present

## 2022-12-08 DIAGNOSIS — I471 Supraventricular tachycardia, unspecified: Secondary | ICD-10-CM

## 2022-12-08 DIAGNOSIS — I4719 Other supraventricular tachycardia: Secondary | ICD-10-CM

## 2022-12-08 DIAGNOSIS — I447 Left bundle-branch block, unspecified: Secondary | ICD-10-CM

## 2022-12-08 DIAGNOSIS — I420 Dilated cardiomyopathy: Secondary | ICD-10-CM

## 2022-12-08 MED ORDER — METOPROLOL SUCCINATE ER 25 MG PO TB24: 12.5000 mg | ORAL_TABLET | Freq: Every day | ORAL | 3 refills | Status: DC

## 2022-12-08 NOTE — Patient Instructions (Signed)

## 2023-01-05 ENCOUNTER — Ambulatory Visit: Payer: BC Managed Care – PPO | Admitting: Internal Medicine

## 2023-01-15 ENCOUNTER — Ambulatory Visit (INDEPENDENT_AMBULATORY_CARE_PROVIDER_SITE_OTHER): Payer: BC Managed Care – PPO | Admitting: Dermatology

## 2023-01-15 VITALS — BP 130/81 | HR 69

## 2023-01-15 DIAGNOSIS — L821 Other seborrheic keratosis: Secondary | ICD-10-CM | POA: Diagnosis not present

## 2023-01-15 DIAGNOSIS — L720 Epidermal cyst: Secondary | ICD-10-CM

## 2023-01-15 DIAGNOSIS — R238 Other skin changes: Secondary | ICD-10-CM | POA: Diagnosis not present

## 2023-01-15 DIAGNOSIS — L814 Other melanin hyperpigmentation: Secondary | ICD-10-CM

## 2023-01-15 DIAGNOSIS — D229 Melanocytic nevi, unspecified: Secondary | ICD-10-CM | POA: Diagnosis not present

## 2023-01-15 DIAGNOSIS — Z7189 Other specified counseling: Secondary | ICD-10-CM

## 2023-01-15 DIAGNOSIS — W908XXA Exposure to other nonionizing radiation, initial encounter: Secondary | ICD-10-CM

## 2023-01-15 DIAGNOSIS — L578 Other skin changes due to chronic exposure to nonionizing radiation: Secondary | ICD-10-CM

## 2023-01-15 NOTE — Progress Notes (Signed)
Follow-Up Visit   Subjective  Theresa Buck is a 67 y.o. female who presents for the following: Spots to check on the face. She has a nevus vs hydrocystoma vs cyst of the left upper eyelid at margin. Referral was sent to [facility], but patient was never contacted. She will have taken care of at an eye doctor in [facility] while having another procedure done around her eyes.    The following portions of the chart were reviewed this encounter and updated as appropriate: medications, allergies, medical history  Review of Systems:  No other skin or systemic complaints except as noted in HPI or Assessment and Plan.  Objective  Well appearing patient in no apparent distress; mood and affect are within normal limits.  A focused examination was performed of the following areas: Face  Relevant physical exam findings are noted in the Assessment and Plan.  Right temple x 1, R zygoma x 1 (2) Waxy tan thin papules x 2       Assessment & Plan   ACTINIC DAMAGE - chronic, secondary to cumulative UV radiation exposure/sun exposure over time - diffuse scaly erythematous macules with underlying dyspigmentation - Recommend daily broad spectrum sunscreen SPF 30+ to sun-exposed areas, reapply every 2 hours as needed.  - Recommend staying in the shade or wearing long sleeves, sun glasses (UVA+UVB protection) and wide brim hats (4-inch brim around the entire circumference of the hat). - Call for new or changing lesions.  LENTIGINES Exam: scattered tan macules  Due to sun exposure  Treatment Plan: Benign-appearing, observe. Recommend daily broad spectrum sunscreen SPF 30+ to sun-exposed areas, reapply every 2 hours as needed.  Call for any changes  Counseling for BBL / IPL / Laser and Coordination of Care Discussed the treatment option of Broad Band Light (BBL) /Intense Pulsed Light (IPL)/ Laser for skin discoloration, including brown spots and redness.  Typically we recommend at  least 1-3 treatment sessions about 5-8 weeks apart for best results.  Cannot have tanned skin when BBL performed, and regular use of sunscreen/photoprotection is advised after the procedure to help maintain results. The patient's condition may also require "maintenance treatments" in the future.  The fee for BBL / laser treatments is $350 per treatment session for the whole face.  A fee can be quoted for other parts of the body.  Insurance typically does not pay for BBL/laser treatments and therefore the fee is an out-of-pocket cost. Recommend prophylactic valtrex treatment. Once scheduled for procedure, will send Rx in prior to patient's appointment.    Seborrheic keratosis (2) Right temple x 1, R zygoma x 1  Reassured benign age-related growth.  Recommend observation.  Discussed cryotherapy if spot(s) become irritated or inflamed or for cosmetic reasons if not irritated.  Discussed cosmetic procedure cryotherapy, noncovered.  $60 for 1st lesion and $15 for each additional lesion if done on the same day.  Maximum charge $350.  One touch-up treatment included no charge. Discussed risks of treatment including dyspigmentation, small scar, and/or recurrence. Recommend daily broad spectrum sunscreen SPF 30+/photoprotection to treated areas once healed.   LN2 x 2 for $75 cosmetic fee  Destruction of lesion - Right temple x 1, R zygoma x 1 (2)  Destruction method: cryotherapy   Informed consent: discussed and consent obtained   Lesion destroyed using liquid nitrogen: Yes   Region frozen until ice ball extended beyond lesion: Yes   Outcome: patient tolerated procedure well with no complications   Post-procedure details:  wound care instructions given   Additional details:  Prior to procedure, discussed risks of blister formation, small wound, skin dyspigmentation, or rare scar following cryotherapy. Recommend Vaseline ointment to treated areas while healing.   SEBORRHEIC KERATOSIS VS MILIA - Tiny  firm flesh papule of the left lateral canthus - Benign-appearing - Discussed benign etiology and prognosis. - Observe - Call for any changes  Milia - tiny firm white papules at L medial canthus, R nasal root - type of cyst - benign - sometimes these will clear with nightly OTC adapalene/Differin 0.1% gel or retinol. Samples of Effaclar 0.1% Gel and HydroBoost Cream. - may be extracted if symptomatic - observe  MELANOCYTIC NEVI Exam: Tan-brown and/or pink-flesh-colored symmetric macules and papules  Treatment Plan: Benign appearing on exam today. Recommend observation. Call clinic for new or changing moles. Recommend daily use of broad spectrum spf 30+ sunscreen to sun-exposed areas.   HYDROCYSTOMA VS NEVUS VS OTHER CYST - 0.3 mm flesh papule at inferior margin L upper eyelid  - Benign appearing, but bothersome to patient. Patient will have treated by Dr Gertie Baron at [facility].  Return if symptoms worsen or fail to improve.  ICherlyn Labella, CMA, am acting as scribe for Willeen Niece, MD .   Documentation: I have reviewed the above documentation for accuracy and completeness, and I agree with the above.  Willeen Niece, MD

## 2023-01-15 NOTE — Patient Instructions (Addendum)
Cryotherapy Aftercare  Wash gently with soap and water everyday.   Apply Vaseline and Band-Aid daily until healed.   Discussed cosmetic procedure cryotherapy, noncovered.  $60 for 1st lesion and $15 for each additional lesion if done on the same day.  Maximum charge $350.  One touch-up treatment included no charge. Discussed risks of treatment including dyspigmentation, small scar, and/or recurrence. Recommend daily broad spectrum sunscreen SPF 30+/photoprotection to treated areas once healed.   Counseling for BBL / IPL / Laser and Coordination of Care Discussed the treatment option of Broad Band Light (BBL) /Intense Pulsed Light (IPL)/ Laser for skin discoloration, including brown spots and redness.  Typically we recommend at least 1-3 treatment sessions about 5-8 weeks apart for best results.  Cannot have tanned skin when BBL performed, and regular use of sunscreen/photoprotection is advised after the procedure to help maintain results. The patient's condition may also require "maintenance treatments" in the future.  The fee for BBL / laser treatments is $350 per treatment session for the whole face.  A fee can be quoted for other parts of the body.  Insurance typically does not pay for BBL/laser treatments and therefore the fee is an out-of-pocket cost. Recommend prophylactic valtrex treatment. Once scheduled for procedure, will send Rx in prior to patient's appointment.    Due to recent changes in healthcare laws, you may see results of your pathology and/or laboratory studies on MyChart before the doctors have had a chance to review them. We understand that in some cases there may be results that are confusing or concerning to you. Please understand that not all results are received at the same time and often the doctors may need to interpret multiple results in order to provide you with the best plan of care or course of treatment. Therefore, we ask that you please give [place] 2 business days to  thoroughly review all your results before contacting the office for clarification. Should we see a critical lab result, you will be contacted sooner.   If You Need Anything After Your Visit  If you have any questions or concerns for your doctor, please call our main line at [phone] and press option 4 to reach your doctor's medical assistant. If no one answers, please leave a voicemail as directed and we will return your call as soon as possible. Messages left after 4 pm will be answered the following business day.   You may also send [place] a message via MyChart. We typically respond to MyChart messages within 1-2 business days.  For prescription refills, please ask your pharmacy to contact our office. Our fax number is [phone].  If you have an urgent issue when the clinic is closed that cannot wait until the next business day, you can page your doctor at the number below.    Please note that while we do our best to be available for urgent issues outside of office hours, we are not available 24/7.   If you have an urgent issue and are unable to reach [place], you may choose to seek medical care at your doctor's office, retail clinic, urgent care center, or emergency room.  If you have a medical emergency, please immediately call 911 or go to the emergency department.  Pager Numbers  - Dr. Gwen Pounds: [phone]  - Dr. Roseanne Reno: [phone]  - Dr. Katrinka Blazing: [phone]   In the event of inclement weather, please call our main line at [phone] for an update on the status of any delays or closures.  Dermatology Medication Tips: Please keep the boxes that topical medications come in in order to help keep track of the instructions about where and how to use these. Pharmacies typically print the medication instructions only on the boxes and not directly on the medication tubes.   If your medication is too expensive, please contact our office at [phone] option 4 or send [place] a message  through MyChart.   We are unable to tell what your co-pay for medications will be in advance as this is different depending on your insurance coverage. However, we may be able to find a substitute medication at lower cost or fill out paperwork to get insurance to cover a needed medication.   If a prior authorization is required to get your medication covered by your insurance company, please allow [place] 1-2 business days to complete this process.  Drug prices often vary depending on where the prescription is filled and some pharmacies may offer cheaper prices.  The website www.goodrx.com contains coupons for medications through different pharmacies. The prices here do not account for what the cost may be with help from insurance (it may be cheaper with your insurance), but the website can give you the price if you did not use any insurance.  - You can print the associated coupon and take it with your prescription to the pharmacy.  - You may also stop by our office during regular business hours and pick up a GoodRx coupon card.  - If you need your prescription sent electronically to a different pharmacy, notify our office through [facility] or by phone at [phone] option 4.     Si Usted Necesita Algo Despus de Su Visita  Tambin puede enviarnos un mensaje a travs de Clinical cytogeneticist. Por lo general respondemos a los mensajes de MyChart en el transcurso de 1 a 2 das hbiles.  Para renovar recetas, por favor pida a su farmacia que se ponga en contacto con nuestra oficina. Annie Sable de fax es [place] [phone].  Si tiene un asunto urgente cuando la clnica est cerrada y que no puede esperar hasta el siguiente da hbil, puede llamar/localizar a su doctor(a) al nmero que aparece a continuacin.   Por favor, tenga en cuenta que aunque hacemos todo lo posible para estar disponibles para asuntos urgentes fuera del horario de [place], no estamos disponibles las 24 horas del da, los 7 [address]  [address] de  la [place].   Si tiene un problema urgente y no puede comunicarse con nosotros, puede optar por buscar atencin mdica  en el consultorio de su doctor(a), en una clnica privada, en un centro de atencin urgente o en una sala de emergencias.  Si tiene Engineer, drilling, por favor llame inmediatamente al 911 o vaya a la sala de emergencias.  Nmeros de bper  - Dr. Gwen Pounds: [phone]  - Dra. Roseanne Reno: [phone]  - Dr. Katrinka Blazing: [phone]   En caso de inclemencias del tiempo, por favor llame a Lacy Duverney principal al [phone] para una actualizacin sobre el [place] de cualquier retraso o cierre.  Consejos para la medicacin en dermatologa: Por favor, guarde las cajas en las que vienen los medicamentos de uso tpico para ayudarle a seguir las instrucciones sobre dnde y cmo usarlos. Las farmacias generalmente imprimen las instrucciones del medicamento slo en las cajas y no directamente en los tubos del [place].   Si su medicamento es muy caro, por favor, pngase en contacto con Rolm Gala llamando al [phone] y presione la opcin 4 o envenos  un mensaje a travs de MyChart.   No podemos decirle cul ser su copago por los medicamentos por adelantado ya que esto es diferente dependiendo de la cobertura de su seguro. Sin embargo, es posible que podamos encontrar un medicamento sustituto a Audiological scientist un formulario para que el seguro cubra el medicamento que se considera necesario.   Si se requiere una autorizacin previa para que su compaa de seguros [place] su medicamento, por favor permtanos de 1 a 2 das hbiles para completar [address]  Los precios de los medicamentos varan con frecuencia dependiendo del Environmental consultant de dnde se surte la receta y alguna farmacias pueden ofrecer precios ms baratos.  El sitio web www.goodrx.com tiene cupones para medicamentos de Health and safety inspector. Los precios aqu no tienen en cuenta lo que podra costar con la ayuda  del seguro (puede ser ms barato con su seguro), pero el sitio web puede darle el precio si no utiliz Tourist information centre manager.  - Puede imprimir el cupn correspondiente y llevarlo con su receta a la farmacia.  - Tambin puede pasar por nuestra oficina durante el horario de atencin regular y Education officer, museum una tarjeta de cupones de GoodRx.  - Si necesita que su receta se enve electrnicamente a una farmacia diferente, informe a nuestra oficina a travs de MyChart de [place] o por telfono llamando al [phone] y presione la opcin 4.

## 2023-02-08 ENCOUNTER — Encounter: Payer: Self-pay | Admitting: Internal Medicine

## 2023-02-08 ENCOUNTER — Ambulatory Visit: Payer: BC Managed Care – PPO | Admitting: Internal Medicine

## 2023-02-08 VITALS — BP 124/72 | HR 80 | Temp 97.5°F | Ht 66.0 in | Wt 144.6 lb

## 2023-02-08 DIAGNOSIS — Z13220 Encounter for screening for lipoid disorders: Secondary | ICD-10-CM

## 2023-02-08 DIAGNOSIS — F439 Reaction to severe stress, unspecified: Secondary | ICD-10-CM | POA: Diagnosis not present

## 2023-02-08 DIAGNOSIS — K219 Gastro-esophageal reflux disease without esophagitis: Secondary | ICD-10-CM | POA: Diagnosis not present

## 2023-02-08 DIAGNOSIS — I471 Supraventricular tachycardia, unspecified: Secondary | ICD-10-CM | POA: Diagnosis not present

## 2023-02-08 NOTE — Progress Notes (Signed)
Subjective:    Patient ID: Theresa Buck, female    DOB: 29-Oct-1955, 67 y.o.   MRN: [id number]  Patient here for  Chief Complaint  Patient presents with   Medical Management of Chronic Issues    HPI Here for a scheduled follow up.Is s/p left total hip replacement 10/13/21. Here to follow up regarding SVT and GERD. Evaluated in ER 10/2022 - chest pain.  Had f/u with Dr Mariah Milling 12/08/22 - recommended to continue metoprolol for wide complex tachycardia/SVT. Previous CTA 2022 - no significant disease. Overall stable from cardiac standpoint.  No chest pain reported.  Breathing stable.  No increased cough or congestion.  Discussed increased stress.  Family and work stress.  Will notify me if feels needs any further intervention.  No SI.    Past Medical History:  Diagnosis Date   Allergy    recurring sinus problems   Anxiety    Arthritis    Chronic back pain    Deviated septum    Deviated septum    GERD (gastroesophageal reflux disease)    H/O sinusitis    Headache    H/O MIGRAINES   Hx of degenerative disc disease    Scoliosis    SVT (supraventricular tachycardia) (HCC)    Past Surgical History:  Procedure Laterality Date   BACK SURGERY  09/26/2013   LUMBAR   COLONOSCOPY  08/03/2015   Dr Mechele Collin   HEMORRHOID SURGERY N/A 03/13/2017   Procedure: HEMORRHOIDECTOMY;  Surgeon: Kieth Brightly, MD;  Location: [facility] ORS;  Service: General;  Laterality: N/A;   SVT ABLATION N/A 09/16/2020   Procedure: SVT ABLATION;  Surgeon: Lanier Prude, MD;  Location: [facility];  Service: Cardiovascular;  Laterality: N/A;   TOTAL HIP ARTHROPLASTY Left 10/13/2021   Procedure: TOTAL HIP ARTHROPLASTY ANTERIOR APPROACH;  Surgeon: Kennedy Bucker, MD;  Location: [facility] ORS;  Service: Orthopedics;  Laterality: Left;   UPPER GI ENDOSCOPY  08/03/2015   Dr Mechele Collin   Family History  Problem Relation Age of Onset   Brain cancer Mother    ALS Father    Diabetes Brother        multiple   Heart  disease Son        myocardial infarction, s/p CABG   Throat cancer Brother    Breast cancer Neg Hx    Social History   Socioeconomic History   Marital status: Widowed    Spouse name: Not on file   Number of children: 3   Years of education: Not on file   Highest education level: Not on file  Occupational History   Not on file  Tobacco Use   Smoking status: Never   Smokeless tobacco: Never  Vaping Use   Vaping status: Never Used  Substance and Sexual Activity   Alcohol use: No    Alcohol/week: 0.0 standard drinks of alcohol   Drug use: No   Sexual activity: Not on file  Other Topics Concern   Not on file  Social History Narrative   Not on file   Social Determinants of Health   Financial Resource Strain: Not on file  Food Insecurity: Not on file  Transportation Needs: Not on file  Physical Activity: Not on file  Stress: Not on file  Social Connections: Not on file     Review of Systems  Constitutional:  Negative for appetite change and unexpected weight change.  HENT:  Negative for congestion and sinus pressure.   Respiratory:  Negative for  cough, chest tightness and shortness of breath.   Cardiovascular:  Negative for chest pain and palpitations.  Gastrointestinal:  Negative for abdominal pain, diarrhea, nausea and vomiting.  Genitourinary:  Negative for difficulty urinating and dysuria.  Musculoskeletal:  Negative for joint swelling and myalgias.  Skin:  Negative for color change and rash.  Neurological:  Negative for dizziness and headaches.  Psychiatric/Behavioral:  Negative for agitation and dysphoric mood.        Objective:     BP 124/72   Pulse 80   Temp (!) 97.5 F (36.4 C) (Oral)   Ht 5\' 6"  (1.676 m)   Wt 144 lb 9.6 oz (65.6 kg)   LMP 07/15/2012   SpO2 98%   BMI 23.34 kg/m  Wt Readings from Last 3 Encounters:  02/08/23 144 lb 9.6 oz (65.6 kg)  12/08/22 145 lb 3.2 oz (65.9 kg)  10/23/22 144 lb (65.3 kg)    Physical Exam Vitals reviewed.   Constitutional:      General: She is not in acute distress.    Appearance: Normal appearance.  HENT:     Head: Normocephalic and atraumatic.     Right Ear: External ear normal.     Left Ear: External ear normal.  Eyes:     General: No scleral icterus.       Right eye: No discharge.        Left eye: No discharge.     Conjunctiva/sclera: Conjunctivae normal.  Neck:     Thyroid: No thyromegaly.  Cardiovascular:     Rate and Rhythm: Normal rate and regular rhythm.  Pulmonary:     Effort: No respiratory distress.     Breath sounds: Normal breath sounds. No wheezing.  Abdominal:     General: Bowel sounds are normal.     Palpations: Abdomen is soft.     Tenderness: There is no abdominal tenderness.  Musculoskeletal:        General: No swelling or tenderness.     Cervical back: Neck supple. No tenderness.  Lymphadenopathy:     Cervical: No cervical adenopathy.  Skin:    Findings: No erythema or rash.  Neurological:     Mental Status: She is alert.  Psychiatric:        Mood and Affect: Mood normal.        Behavior: Behavior normal.      Outpatient Encounter Medications as of 02/08/2023  Medication Sig   Biotin 5000 MCG TABS Take 5,000 mcg by mouth daily.   Calcium Carb-Cholecalciferol (CALCIUM + D3 PO) Take 1 tablet daily by mouth.   carboxymethylcellulose (REFRESH PLUS) 0.5 % SOLN Place 1 drop into both eyes 3 (three) times daily as needed (dry eyes).   clobetasol cream (TEMOVATE) 0.05 % Apply to bumps on right side twice daily until improved. Avoid face, groin, axilla.   estradiol (ESTRACE) 0.1 MG/GM vaginal cream INSERT 1 APPLICATORFUL VAGINALLY DAILY   estradiol (ESTRACE) 1 MG tablet TAKE 1/2 TABLET(0.5 MG) BY MOUTH DAILY   hydrocortisone (ANUSOL-HC) 25 MG suppository Place 1 suppository (25 mg total) rectally 2 (two) times daily.   metoprolol succinate (TOPROL-XL) 25 MG 24 hr tablet Take 0.5 tablets (12.5 mg total) by mouth daily. Take with or immediately following a  meal.   mometasone (ELOCON) 0.1 % lotion Apply to affected area scalp once to twice daily until improved.   montelukast (SINGULAIR) 10 MG tablet Take 10 mg at bedtime by mouth.    Multiple Vitamin (MULTIVITAMIN) tablet Take 1 tablet  by mouth daily.   pantoprazole (PROTONIX) 20 MG tablet Take 1 tablet (20 mg total) by mouth daily as needed.   progesterone (PROMETRIUM) 100 MG capsule TAKE 1 CAPSULE(100 MG) BY MOUTH DAILY   traZODone (DESYREL) 50 MG tablet TAKE 1/2 TO 1 TABLET(25 TO 50 MG) BY MOUTH AT BEDTIME AS NEEDED FOR SLEEP   vitamin C (ASCORBIC ACID) 500 MG tablet Take 500 mg by mouth daily.   [DISCONTINUED] Probiotic Product (PROBIOTIC ADVANCED PO) Take 1 capsule daily by mouth.  (Patient not taking: Reported on 02/08/2023)   No facility-administered encounter medications on file as of 02/08/2023.     Lab Results  Component Value Date   WBC 6.7 10/23/2022   HGB 14.4 10/23/2022   HCT 44.0 10/23/2022   PLT 218 10/23/2022   GLUCOSE 99 02/08/2023   CHOL 191 02/08/2023   TRIG 73.0 02/08/2023   HDL 54.60 02/08/2023   LDLCALC 122 (H) 02/08/2023   ALT 12 02/08/2023   AST 15 02/08/2023   NA 139 02/08/2023   K 4.5 02/08/2023   CL 100 02/08/2023   CREATININE 0.77 02/08/2023   BUN 19 02/08/2023   CO2 32 02/08/2023   TSH 1.57 09/01/2022   INR 1.0 10/12/2021    DG Chest 2 View  Result Date: 10/23/2022 CLINICAL DATA:  Left-sided chest pain EXAM: CHEST - 2 VIEW COMPARISON:  Chest radiograph dated 05/13/2021 FINDINGS: Normal lung volumes. No focal consolidations. No pleural effusion or pneumothorax. The heart size and mediastinal contours are within normal limits. No acute osseous abnormality. IMPRESSION: No active cardiopulmonary disease. Electronically Signed   By: Agustin Cree M.D.   On: 10/23/2022 09:57       Assessment & Plan:  Supraventricular tachycardia [facility]) Assessment & Plan: Sees Dr Mariah Milling. On metoprolol 25mg .  Follow.  Stable.   Orders: -     Basic metabolic panel -      Hepatic function panel  Screening cholesterol level -     Lipid panel  Stress Assessment & Plan: Increased stress. Family and work stress.  Notify me if feels needs any further intervention.  Follow.    Gastro-esophageal reflux disease without esophagitis Assessment & Plan: No upper symptoms reported.  Protonix.       Dale , MD

## 2023-02-09 ENCOUNTER — Ambulatory Visit: Payer: BC Managed Care – PPO | Admitting: Internal Medicine

## 2023-02-09 LAB — LIPID PANEL
Cholesterol: 191 mg/dL (ref 0–200)
HDL: 54.6 mg/dL (ref >39.00)
LDL Cholesterol: 122 mg/dL — ABNORMAL HIGH (ref 0–99)
NonHDL: 136.41
Total CHOL/HDL Ratio: 3
Triglycerides: 73 mg/dL (ref 0.0–149.0)
VLDL: 14.6 mg/dL (ref 0.0–40.0)

## 2023-02-09 LAB — HEPATIC FUNCTION PANEL
ALT: 12 U/L (ref 0–35)
AST: 15 U/L (ref 0–37)
Albumin: 4.4 g/dL (ref 3.5–5.2)
Alkaline Phosphatase: 83 U/L (ref 39–117)
Bilirubin, Direct: 0.1 mg/dL (ref 0.0–0.3)
Total Bilirubin: 0.5 mg/dL (ref 0.2–1.2)
Total Protein: 6.8 g/dL (ref 6.0–8.3)

## 2023-02-09 LAB — BASIC METABOLIC PANEL
BUN: 19 mg/dL (ref 6–23)
CO2: 32 meq/L (ref 19–32)
Calcium: 9.8 mg/dL (ref 8.4–10.5)
Chloride: 100 meq/L (ref 96–112)
Creatinine, Ser: 0.77 mg/dL (ref 0.40–1.20)
GFR: 80.61 mL/min (ref >60.00)
Glucose, Bld: 99 mg/dL (ref 70–99)
Potassium: 4.5 meq/L (ref 3.5–5.1)
Sodium: 139 meq/L (ref 135–145)

## 2023-02-11 ENCOUNTER — Encounter: Payer: Self-pay | Admitting: Internal Medicine

## 2023-02-11 NOTE — Assessment & Plan Note (Signed)
Sees Dr Rockey Situ. On metoprolol 41m.  Follow.  Stable.

## 2023-02-11 NOTE — Assessment & Plan Note (Signed)
No upper symptoms reported.  Protonix.  

## 2023-02-11 NOTE — Assessment & Plan Note (Signed)
Increased stress. Family and work stress.  Notify me if feels needs any further intervention.  Follow.

## 2023-02-23 ENCOUNTER — Ambulatory Visit: Payer: BC Managed Care – PPO | Attending: Cardiology | Admitting: Cardiology

## 2023-02-23 ENCOUNTER — Ambulatory Visit: Payer: BC Managed Care – PPO

## 2023-02-23 ENCOUNTER — Encounter: Payer: Self-pay | Admitting: Cardiology

## 2023-02-23 VITALS — BP 118/70 | HR 66 | Ht 66.0 in | Wt 144.2 lb

## 2023-02-23 DIAGNOSIS — R002 Palpitations: Secondary | ICD-10-CM

## 2023-02-23 DIAGNOSIS — I4719 Other supraventricular tachycardia: Secondary | ICD-10-CM | POA: Diagnosis not present

## 2023-02-23 DIAGNOSIS — I493 Ventricular premature depolarization: Secondary | ICD-10-CM

## 2023-02-23 DIAGNOSIS — I471 Supraventricular tachycardia, unspecified: Secondary | ICD-10-CM | POA: Diagnosis not present

## 2023-02-23 NOTE — Patient Instructions (Signed)
Medication Instructions:  Stop Metoprolol Succinate 12.5 MG daily.  *If you need a refill on your cardiac medications before your next appointment, please call your pharmacy*   Lab Work: None ordered  If you have labs (blood work) drawn today and your tests are completely normal, you will receive your results only by: MyChart Message (if you have MyChart) OR A paper copy in the mail If you have any lab test that is abnormal or we need to change your treatment, we will call you to review the results.   Testing/Procedures: Heart Monitor:  Your physician has requested you wear a ZIO monitor for 14  days. Apply 1 week after stopping Metoprolol.   Your monitor will be mailed to your home address within 3-5 business days. This is sent via [employer] from [employer]. However, if you have not received your monitor after 5 business days please send [place] a MyChart message or call the office at [phone], so we may follow up on this for you.   This monitor is a medical device (single patch monitor) that records the heart's electrical activity. Doctors most often use these monitors to diagnose arrhythmias. Arrhythmias are problems with the speed or rhythm of the heartbeat.   [employer] supplies 1 patch per enrollment. Additional stickers are not available.  Please DO NOT apply the patch if you will be having a Nuclear Stress Test, Echocardiogram, Cardiac CT, Cardiac MRI, Chest X-ray during the period you would be wearing the monitor. The patch cannot be worn during these tests.  You cannot remove and re-apply the ZIO patch monitor.   Applying the Monitor: Once you receive your monitor, this will include a small razor, abrader, and 4 alcohol pads. Shave hair from upper left chest Rub abrader disc in 40 strokes over the left upper chest as indicated in your monitor instructions Clean area with 4 enclosed alcohol pads (there may be a mild & brief stinging sensation over the newly abraded area,  but this is normal). Let dry Apply patch as indicated in monitor instructions. Patch will be placed under collarbone on the left side of the chest with arrow pointing upward. Rub adhesive wings for 2 minutes. Remove white label marked "1". Remove the white label marked "2". Rub patch adhesive wings for an 2 minutes.  While looking in a mirror, press and release button in the center of the patch. You may hear a "click". A small green light will flash 4-6 times and then stop. This will be your indicator that the monitor has been turned on.  Wearing the Monitor: Avoid showering during the first 24 hours of wearing the monitor.  After 24 hours you may shower with the patch on. Take brief showers with your back facing the shower head.  Avoid excessive sweating to help maximize wear time. Do not submerge the device, no hot tubs, and no swimming pools. Keep any lotions or oils away from the patch. Press the button if you feel a symptom. You will hear a small click. Record date, time, and symptoms in the Patient Logbook or App.  Monitor Issues: Call [employer] Customer Care at [phone] if you have questions regarding your Zio Patch Monitor. Call them immediately if you see an orange/ amber colored light blinking on your monitor. If your monitor falls off and you cannot get this reapplied or if you need suggestions for securing your monitor call [employer] at [phone].   Returning the Monitor: Once you have completed wearing your  monitor, follow instructions on the last 2 pages of the Patient Logbook. Stick monitor patch on to the last page of the Patient Logbook.  Place Patient Logbook with monitor in the return box provided. Use locking tab on box and tape box closed securely. The return box has pre-paid postage on it.  Place the return box in the regular [place] Mail box as soon as possible It will take anywhere from 1-2 weeks for your provider to receive and review your results once  you mail this back. If for some reason you have misplaced your return box then call our office and we can provide another box and/or mail it off for you.   Billing  and Patient Assistance Program Information: We have supplied [employer] with any of your insurance information on file for billing purposes. [employer] offers a sliding scale Patient Assistance Program for patients that do not have insurance, or whose insurance does not completely cover the cost of the ZIO monitor. You must apply for the Patient Assistance Program to qualify for this discounted rate. To apply, please call [employer] at [phone], select option 1, ask to apply for the Patient Assistance Program. [employer] will ask your household income, and how many people are in your household. They will quote your out-of-pocket cost based on that information. [employer] will also be able to set up for a 61-month, interest-free payment plan if needed.      Follow-Up: At [facility], you and your health needs are our priority.  As part of our continuing mission to provide you with exceptional heart care, we have created designated Provider Care Teams.  These Care Teams include your primary Cardiologist (physician) and Advanced Practice Providers (APPs -  Physician Assistants and Nurse Practitioners) who all work together to provide you with the care you need, when you need it.  We recommend signing up for the patient portal called "MyChart".  Sign up information is provided on this After Visit Summary.  MyChart is used to connect with patients for Virtual Visits (Telemedicine).  Patients are able to view lab/test results, encounter notes, upcoming appointments, etc.  Non-urgent messages can be sent to your provider as well.   To learn more about what you can do with MyChart, go to [email or web address].    Your next appointment:   6 month(s)  Provider:   Sherie Don, NP

## 2023-02-23 NOTE — Progress Notes (Signed)
Cardiology Office Note Date:  02/23/2023  Patient ID:  Theresa Buck, Theresa Buck 10-05-1955, MRN [id number] PCP:  Dale [place], MD  Cardiologist:  Julien Nordmann, MD Electrophysiologist: Lanier Prude, MD    Chief Complaint: SVT, PVC follow-up  History of Present Illness: Theresa Buck is a 67 y.o. female with PMH notable for SVT, AVNRT, PVCs, palpitations; seen today for Lanier Prude, MD for routine electrophysiology followup.  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  She last saw Dr. Lalla Brothers 06/2021, where she continued to have brief episodes of palpitations. He favored mgmt with BB versus repeat EPS. She fell 09/2021 and required L hip surgery. On follow-up with Dr. Mariah Milling, BB lowered from 25mg  toprol > 12.5mg  d/t concern for hypotension with recent fall.   On follow-up today, she continues to heal from hip surgery, continues to have hip/leg pain that limits her activities. She used to enjoy dancing, and can no longer do this d/t pain. She has started dry needling, that seems to be helping so far. She questions whether her metop dose lead her to syncopal episode.  She has very rare palpitation episodes, can not remember the last time she had one. She denies chest pain, chest pressure, SOB, syncope or presyncope.    AAD History: None   Past Medical History:  Diagnosis Date   Allergy    recurring sinus problems   Anxiety    Arthritis    Chronic back pain    Deviated septum    Deviated septum    GERD (gastroesophageal reflux disease)    H/O sinusitis    Headache    H/O MIGRAINES   Hx of degenerative disc disease    Scoliosis    SVT (supraventricular tachycardia) (HCC)     Past Surgical History:  Procedure Laterality Date   BACK SURGERY  09/26/2013   LUMBAR   COLONOSCOPY  08/03/2015   Dr Mechele Collin   HEMORRHOID SURGERY N/A 03/13/2017   Procedure: HEMORRHOIDECTOMY;  Surgeon: Kieth Brightly, MD;  Location: [facility];  Service: General;  Laterality:  N/A;   SVT ABLATION N/A 09/16/2020   Procedure: SVT ABLATION;  Surgeon: Lanier Prude, MD;  [facility];  Service: Cardiovascular;  Laterality: N/A;   TOTAL HIP ARTHROPLASTY Left 10/13/2021   Procedure: TOTAL HIP ARTHROPLASTY ANTERIOR APPROACH;  Surgeon: Kennedy Bucker, MD;  Location: [facility];  Service: Orthopedics;  Laterality: Left;   UPPER GI ENDOSCOPY  08/03/2015   Dr Mechele Collin    Current Outpatient Medications  Medication Instructions   ascorbic acid (VITAMIN C) 500 mg, Oral, Daily   Biotin 5,000 mcg, Oral, Daily   Calcium Carb-Cholecalciferol (CALCIUM + D3 PO) 1 tablet, Oral, Daily   carboxymethylcellulose (REFRESH PLUS) 0.5 % SOLN 1 drop, Both Eyes, 3 times daily PRN   clobetasol cream (TEMOVATE) 0.05 % Apply to bumps on right side twice daily until improved. Avoid face, groin, axilla.   estradiol (ESTRACE) 0.1 MG/GM vaginal cream INSERT 1 APPLICATORFUL VAGINALLY DAILY   estradiol (ESTRACE) 1 MG tablet TAKE 1/2 TABLET(0.5 MG) BY MOUTH DAILY   hydrocortisone (ANUSOL-HC) 25 mg, Rectal, 2 times daily   metoprolol succinate (TOPROL-XL) 12.5 mg, Oral, Daily, Take with or immediately following a meal.   mometasone (ELOCON) 0.1 % lotion Apply to affected area scalp once to twice daily until improved.   montelukast (SINGULAIR) 10 mg, Oral, Daily at bedtime   Multiple Vitamin (MULTIVITAMIN) tablet 1 tablet, Oral, Daily   pantoprazole (PROTONIX) 20  mg, Oral, Daily PRN   progesterone (PROMETRIUM) 100 MG capsule TAKE 1 CAPSULE(100 MG) BY MOUTH DAILY   traZODone (DESYREL) 50 MG tablet TAKE 1/2 TO 1 TABLET(25 TO 50 MG) BY MOUTH AT BEDTIME AS NEEDED FOR SLEEP    Social History:  The patient  reports that she has never smoked. She has never used smokeless tobacco. She reports that she does not drink alcohol and does not use drugs.   Family History:  The patient's family history includes ALS in her father; Brain cancer in her mother; Diabetes in her brother; Heart disease in her  son; Throat cancer in her brother.  ROS:  Please see the history of present illness. All other systems are reviewed and otherwise negative.   PHYSICAL EXAM:  VS:  BP 118/70 (BP Location: Left Arm, Patient Position: Sitting, Cuff Size: Normal)   Pulse 66   Ht 5\' 6"  (1.676 m)   Wt 144 lb 3.2 oz (65.4 kg)   LMP 07/15/2012   SpO2 98%   BMI 23.27 kg/m  BMI: Body mass index is 23.27 kg/m.  GEN- The patient is well appearing, alert and oriented x 3 today.   Lungs- Clear to ausculation bilaterally, normal work of breathing.  Heart- Regular rate and rhythm, no murmurs, rubs or gallops Extremities- No peripheral edema, warm, dry   EKG is ordered. Personal review of EKG from today shows:    EKG Interpretation Date/Time:  Friday February 23 2023 14:44:00 EDT Ventricular Rate:  66 PR Interval:  148 QRS Duration:  122 QT Interval:  424 QTC Calculation: 444 R Axis:   -50  Text Interpretation: Normal sinus rhythm Left axis deviation Left bundle branch block Confirmed by Sherie Don [phone]) on 02/23/2023 2:48:19 PM    12/08/2022 EKG: NSR, rate 68bpm; LAD, LVH, incomplete LBBB  Recent Labs: 09/01/2022: TSH 1.57 10/23/2022: Hemoglobin 14.4; Platelets 218 02/08/2023: ALT 12; BUN 19; Creatinine, Ser 0.77; Potassium 4.5; Sodium 139  02/08/2023: Cholesterol 191; HDL 54.60; LDL Cholesterol 122; Total CHOL/HDL Ratio 3; Triglycerides 73.0; VLDL 14.6   Estimated Creatinine Clearance: 64.8 mL/min (by C-G formula based on SCr of 0.77 mg/dL).   Wt Readings from Last 3 Encounters:  02/23/23 144 lb 3.2 oz (65.4 kg)  02/08/23 144 lb 9.6 oz (65.6 kg)  12/08/22 145 lb 3.2 oz (65.9 kg)     Additional studies reviewed include: Previous EP, cardiology notes.   CT Coronary, 12/08/2021 1. Coronary calcium score of 27.6. This was 70th percentile for age and sex matched control.  2. Normal coronary origin with right dominance.  3. Minimal stenosis (<25%) in the proximal LAD.  4. CAD-RADS 1. Minimal  non-obstructive CAD (0-24%). Consider non-atherosclerotic causes of chest pain. Consider preventive therapy and risk factor modification.  Long term monitor, 06/20/2021 HR 50 - 200 bpm, average 72 bpm. 3 NSVT, longest 4 beats with rate of 148 bpm. 18 SVT, longest 11 beats with a rate of 139 bpm. Rhythm strips suggest atrial tachycardia. Rare supraventricular and occasional ventricular ectopy. Symptom triggered episodes correspond to sinus rhythm.  TTE, 06/17/2021  1. Left ventricular ejection fraction, by estimation, is 55 to 60%. The left ventricle has normal function. The left ventricle has no regional wall motion abnormalities. Left ventricular diastolic parameters are consistent with Grade I diastolic dysfunction (impaired relaxation). The average left ventricular global longitudinal strain is -17.6 %.   2. Right ventricular systolic function is normal. The right ventricular size is normal. There is normal pulmonary artery systolic pressure. The estimated  right ventricular systolic pressure is 24.4 mmHg.   3. The mitral valve is normal in structure. Mild mitral valve regurgitation. No evidence of mitral stenosis.   4. The aortic valve is tricuspid. Aortic valve regurgitation is not visualized. No aortic stenosis is present.   5. The inferior vena cava is normal in size with greater than 50% respiratory variability, suggesting right atrial pressure of 3 mmHg.   Comparison(s): LVEF 45-50%.     ASSESSMENT AND PLAN:  #) SVT #) s/p AVNRT ablation #) PVC #) Palpitations She has had no significant episodes of palpitations in recent memory and requests to stop metoprolol if appropriate. Given her lack of palpitation symptoms with absence of PVCs on today's EKG, I think this is a reasonable request I would like for her to have an updated 2 week monitor after the BB washes out, to ensure that her PVC and SVT burden do not rise while off BB         Current medicines are reviewed at length  with the patient today.   The patient has concerns regarding her medicines.  The following changes were made today:  STOP 12.5mg  toprol  Labs/ tests ordered today include:  Orders Placed This Encounter  Procedures   LONG TERM MONITOR (3-14 DAYS)   EKG 12-Lead     Disposition: Follow up with Dr. Lalla Brothers or EP APP  in 6-9 months , sooner if 2 week monitor is concerning.   Signed, Sherie Don, NP  02/23/23  2:48 PM  Electrophysiology [facility]

## 2023-02-28 DIAGNOSIS — I493 Ventricular premature depolarization: Secondary | ICD-10-CM | POA: Diagnosis not present

## 2023-02-28 DIAGNOSIS — R002 Palpitations: Secondary | ICD-10-CM | POA: Diagnosis not present

## 2023-02-28 DIAGNOSIS — I471 Supraventricular tachycardia, unspecified: Secondary | ICD-10-CM | POA: Diagnosis not present

## 2023-03-06 ENCOUNTER — Other Ambulatory Visit: Payer: Self-pay | Admitting: Internal Medicine

## 2023-03-26 ENCOUNTER — Telehealth: Payer: Self-pay | Admitting: Cardiovascular Disease

## 2023-03-26 NOTE — Telephone Encounter (Signed)
Patient is returning call to discuss monitor results.

## 2023-03-26 NOTE — Telephone Encounter (Signed)
Patient made aware of results and verbalized understanding. She had already restarted the toprol 12.5 mg. This has been added to her medication list.     26 SVT episodes And 4% PVC burden   Triggered events associated with PVCs.   If PVCs or SVT episodes are bothersome to patient, would recommend restarting 12.5mg  toprol. However, neither are happening often enough that we HAVE to restart toprol.

## 2023-04-06 ENCOUNTER — Other Ambulatory Visit: Payer: Self-pay | Admitting: Internal Medicine

## 2023-04-06 DIAGNOSIS — Z79899 Other long term (current) drug therapy: Secondary | ICD-10-CM

## 2023-04-06 MED ORDER — PROGESTERONE MICRONIZED 100 MG PO CAPS: 100.0000 mg | ORAL_CAPSULE | Freq: Every day | ORAL | 1 refills | Status: DC

## 2023-04-06 MED ORDER — ESTRADIOL 1 MG PO TABS: ORAL_TABLET | ORAL | 1 refills | Status: DC

## 2023-04-06 NOTE — Progress Notes (Signed)
Rx sent in for estradiol and progesterone.

## 2023-06-15 ENCOUNTER — Ambulatory Visit: Payer: Self-pay | Admitting: Internal Medicine

## 2023-06-15 NOTE — Telephone Encounter (Signed)
 Chief Complaint: GI Symptoms Symptoms: Diarrhea, Constipation, Gas Frequency: Intermittent Pertinent Negatives: Patient denies nausea, vomiting, abdomen, blood in stool, dizziness, fever Disposition: [] ED /[] Urgent Care (no appt availability in office) / [x] Appointment(In office/virtual)/ []  [place] [facility]/ [] Home Care/ [] Refused Recommended Disposition /[] [place] [facility]/ []  Follow-up with PCP Additional Notes: Pt states the GI issues have been going on for 3 weeks at this severity level. Pt states she has been having issues for a couple months. Pt states she goes between constipation and diarrhea. Appt scheduled for pt. This RN educated pt on home care, new-worsening symptoms, when to call back/seek emergent care. Pt verbalized understanding and agrees to plan.     Copied from CRM [id number]. Topic: Clinical - Medical Advice >> Jun 15, 2023  4:35 PM Tiffany H wrote: Reason for CRM: Patient called to advise that she's been having stomach problems for the past couple of months. She advised that she feels gassy sometimes, can't keep food down or get food in and she has unpredictable periods of constipation and alternately, diarrhea.   Patient is scheduled to see Dr. Lorin Picket on 07/20/23. Patient has been treating herself with  OTC relief but nothing seems to work. Please advise.   Pink Word: Diarrhea, constipation. Reason for Disposition . Diarrhea is a chronic symptom (recurrent or ongoing AND present > 4 weeks)  Answer Assessment - Initial Assessment Questions 1. DIARRHEA SEVERITY: "How bad is the diarrhea?" "How many more stools have you had in the past 24 hours than normal?"    - NO DIARRHEA (SCALE 0)   - MILD (SCALE 1-3): Few loose or mushy BMs; increase of 1-3 stools over normal daily number of stools; mild increase in ostomy output.   -  MODERATE (SCALE 4-7): Increase of 4-6 stools daily over normal; moderate increase in ostomy output.   -  SEVERE (SCALE 8-10; OR "WORST  POSSIBLE"): Increase of 7 or more stools daily over normal; moderate increase in ostomy output; incontinence.     Mild, about 3 times 2. ONSET: "When did the diarrhea begin?"      3 weeks 3. BM CONSISTENCY: "How loose or watery is the diarrhea?"      Loose and watery with diarrhea 4. VOMITING: "Are you also vomiting?" If Yes, ask: "How many times in the past 24 hours?"      Denies 5. ABDOMEN PAIN: "Are you having any abdomen pain?" If Yes, ask: "What does it feel like?" (e.g., crampy, dull, intermittent, constant)      Denies 6. ABDOMEN PAIN SEVERITY: If present, ask: "How bad is the pain?"  (e.g., Scale 1-10; mild, moderate, or severe)   - MILD (1-3): doesn't interfere with normal activities, abdomen soft and not tender to touch    - MODERATE (4-7): interferes with normal activities or awakens from sleep, abdomen tender to touch    - SEVERE (8-10): excruciating pain, doubled over, unable to do any normal activities       Denies 7. ORAL INTAKE: If vomiting, "Have you been able to drink liquids?" "How much liquids have you had in the past 24 hours?"     Yes 8. HYDRATION: "Any signs of dehydration?" (e.g., dry mouth [not just dry lips], too weak to stand, dizziness, new weight loss) "When did you last urinate?"     Denies 9. EXPOSURE: "Have you traveled to a foreign country recently?" "Have you been exposed to anyone with diarrhea?" "Could you have eaten any food that was spoiled?"  Denies 10. ANTIBIOTIC USE: "Are you taking antibiotics now or have you taken antibiotics in the past 2 months?"       No 11. OTHER SYMPTOMS: "Do you have any other symptoms?" (e.g., fever, blood in stool)       Denies  Protocols used: Diarrhea-A-AH

## 2023-06-18 NOTE — Telephone Encounter (Signed)
 Called to offer patient appt today. Pt declined and said she only wanted to see Dr Lorin Picket. Pt stated that she would just wait until 3/28 to be seen. Advised I could move appt up with Dr Lorin Picket. Pt could not do anything this week due to restrictions with work. Pt scheduled 3/7.

## 2023-06-22 ENCOUNTER — Ambulatory Visit: Payer: BC Managed Care – PPO | Admitting: Nurse Practitioner

## 2023-06-29 ENCOUNTER — Ambulatory Visit

## 2023-06-29 ENCOUNTER — Ambulatory Visit: Payer: BC Managed Care – PPO | Admitting: Internal Medicine

## 2023-06-29 VITALS — BP 108/70 | HR 84 | Temp 97.9°F | Resp 16 | Ht 66.0 in | Wt 143.6 lb

## 2023-06-29 DIAGNOSIS — I471 Supraventricular tachycardia, unspecified: Secondary | ICD-10-CM

## 2023-06-29 DIAGNOSIS — K219 Gastro-esophageal reflux disease without esophagitis: Secondary | ICD-10-CM

## 2023-06-29 DIAGNOSIS — R0781 Pleurodynia: Secondary | ICD-10-CM | POA: Diagnosis not present

## 2023-06-29 DIAGNOSIS — R197 Diarrhea, unspecified: Secondary | ICD-10-CM | POA: Diagnosis not present

## 2023-06-29 DIAGNOSIS — F439 Reaction to severe stress, unspecified: Secondary | ICD-10-CM | POA: Diagnosis not present

## 2023-06-29 NOTE — Patient Instructions (Signed)
 Avoid increased artificial sweetener  Start probiotic  Benefiber daily.

## 2023-06-29 NOTE — Progress Notes (Signed)
 Subjective:    Patient ID: Theresa Buck, female    DOB: [birth year], 68 y.o.   MRN: [id number]  Patient here for  Chief Complaint  Patient presents with   Constipation   Diarrhea   Gas   Bloated    HPI Here to discuss issues with constipation. Reports that over starting 3-4 weeks ago noticed alternating diarrhea/constipation and increased gas. Reports now - 4-5 bowels movements per day - water/soft stool. Questioning if emptying. Taking protonix q day now. Was having some increased acid reflux. Back to taking regularly now. No vomiting. Some right side pain. Saw chiropractor - s/p adjustment. Since - has noticed some pain to palpation. Taking tylenol. Icing. Has been dealing with increased stress.    Past Medical History:  Diagnosis Date   Allergy    recurring sinus problems   Anxiety    Arthritis    Chronic back pain    Deviated septum    Deviated septum    GERD (gastroesophageal reflux disease)    H/O sinusitis    Headache    H/O MIGRAINES   Hx of degenerative disc disease    Scoliosis    SVT (supraventricular tachycardia) (HCC)    Past Surgical History:  Procedure Laterality Date   BACK SURGERY  09/26/2013   LUMBAR   COLONOSCOPY  08/03/2015   Dr Mechele Collin   HEMORRHOID SURGERY N/A 03/13/2017   Procedure: HEMORRHOIDECTOMY;  Surgeon: Kieth Brightly, MD;  Location: [facility];  Service: General;  Laterality: N/A;   SVT ABLATION N/A 09/16/2020   Procedure: SVT ABLATION;  Surgeon: Lanier Prude, MD;  Location: [facility];  Service: Cardiovascular;  Laterality: N/A;   TOTAL HIP ARTHROPLASTY Left 10/13/2021   Procedure: TOTAL HIP ARTHROPLASTY ANTERIOR APPROACH;  Surgeon: Kennedy Bucker, MD;  Location: [facility];  Service: Orthopedics;  Laterality: Left;   UPPER GI ENDOSCOPY  08/03/2015   Dr Mechele Collin   Family History  Problem Relation Age of Onset   Brain cancer Mother    ALS Father    Diabetes Brother        multiple   Heart disease Son         myocardial infarction, s/p CABG   Throat cancer Brother    Breast cancer Neg Hx    Social History   Socioeconomic History   Marital status: Widowed    Spouse name: Not on file   Number of children: 3   Years of education: Not on file   Highest education level: Not on file  Occupational History   Not on file  Tobacco Use   Smoking status: Never   Smokeless tobacco: Never  Vaping Use   Vaping status: Never Used  Substance and Sexual Activity   Alcohol use: No    Alcohol/week: 0.0 standard drinks of alcohol   Drug use: No   Sexual activity: Not on file  Other Topics Concern   Not on file  Social History Narrative   Not on file   Social Drivers of Health   Financial Resource Strain: Not on file  Food Insecurity: Not on file  Transportation Needs: Not on file  Physical Activity: Not on file  Stress: Not on file  Social Connections: Not on file     Review of Systems  Constitutional:  Negative for fever and unexpected weight change.  HENT:  Negative for congestion and sinus pressure.   Respiratory:  Negative for cough, chest tightness and shortness of breath.   Cardiovascular:  Negative for chest pain, palpitations and leg swelling.  Gastrointestinal:  Positive for diarrhea. Negative for vomiting.       No increased abdominal pain.   Genitourinary:  Negative for difficulty urinating and dysuria.  Musculoskeletal:  Negative for joint swelling and myalgias.  Skin:  Negative for color change and rash.  Neurological:  Negative for dizziness and headaches.  Psychiatric/Behavioral:  Negative for agitation and dysphoric mood.        Objective:     BP 108/70   Pulse 84   Temp 97.9 F (36.6 C)   Resp 16   Ht 5\' 6"  (1.676 m)   Wt 143 lb 10.1 oz (65.2 kg)   LMP 07/15/2012   SpO2 99%   BMI 23.18 kg/m  Wt Readings from Last 3 Encounters:  06/29/23 143 lb 10.1 oz (65.2 kg)  02/23/23 144 lb 3.2 oz (65.4 kg)  02/08/23 144 lb 9.6 oz (65.6 kg)    Physical  Exam Vitals reviewed.  Constitutional:      General: She is not in acute distress.    Appearance: Normal appearance.  HENT:     Head: Normocephalic and atraumatic.     Right Ear: External ear normal.     Left Ear: External ear normal.     Mouth/Throat:     Pharynx: No oropharyngeal exudate or posterior oropharyngeal erythema.  Eyes:     General: No scleral icterus.       Right eye: No discharge.        Left eye: No discharge.     Conjunctiva/sclera: Conjunctivae normal.  Neck:     Thyroid: No thyromegaly.  Cardiovascular:     Rate and Rhythm: Normal rate and regular rhythm.  Pulmonary:     Effort: No respiratory distress.     Breath sounds: Normal breath sounds. No wheezing.  Abdominal:     General: Bowel sounds are normal.     Palpations: Abdomen is soft.     Tenderness: There is no abdominal tenderness.  Musculoskeletal:        General: No swelling or tenderness.     Cervical back: Neck supple. No tenderness.  Lymphadenopathy:     Cervical: No cervical adenopathy.  Skin:    Findings: No erythema or rash.  Neurological:     Mental Status: She is alert.  Psychiatric:        Mood and Affect: Mood normal.        Behavior: Behavior normal.         Outpatient Encounter Medications as of 06/29/2023  Medication Sig   Biotin 5000 MCG TABS Take 5,000 mcg by mouth daily.   Calcium Carb-Cholecalciferol (CALCIUM + D3 PO) Take 1 tablet daily by mouth.   carboxymethylcellulose (REFRESH PLUS) 0.5 % SOLN Place 1 drop into both eyes 3 (three) times daily as needed (dry eyes).   clobetasol cream (TEMOVATE) 0.05 % Apply to bumps on right side twice daily until improved. Avoid face, groin, axilla.   estradiol (ESTRACE) 0.1 MG/GM vaginal cream INSERT 1 APPLICATORFUL VAGINALLY DAILY   estradiol (ESTRACE) 1 MG tablet 1/2 tablet q day.   hydrocortisone (ANUSOL-HC) 25 MG suppository Place 1 suppository (25 mg total) rectally 2 (two) times daily.   metoprolol succinate (TOPROL-XL) 25 MG 24  hr tablet Take 12.5 mg by mouth daily.   mometasone (ELOCON) 0.1 % lotion Apply to affected area scalp once to twice daily until improved.   montelukast (SINGULAIR) 10 MG tablet Take 10 mg at bedtime by  mouth.    Multiple Vitamin (MULTIVITAMIN) tablet Take 1 tablet by mouth daily.   pantoprazole (PROTONIX) 20 MG tablet TAKE 1 TABLET(20 MG) BY MOUTH DAILY AS NEEDED   progesterone (PROMETRIUM) 100 MG capsule Take 1 capsule (100 mg total) by mouth daily.   traZODone (DESYREL) 50 MG tablet TAKE 1/2 TO 1 TABLET(25 TO 50 MG) BY MOUTH AT BEDTIME AS NEEDED FOR SLEEP   vitamin C (ASCORBIC ACID) 500 MG tablet Take 500 mg by mouth daily.   No facility-administered encounter medications on file as of 06/29/2023.     Lab Results  Component Value Date   WBC 8.0 06/29/2023   HGB 14.1 06/29/2023   HCT 42.4 06/29/2023   PLT 235 06/29/2023   GLUCOSE 80 06/29/2023   CHOL 191 02/08/2023   TRIG 73.0 02/08/2023   HDL 54.60 02/08/2023   LDLCALC 122 (H) 02/08/2023   ALT 9 06/29/2023   AST 14 06/29/2023   NA 141 06/29/2023   K 4.7 06/29/2023   CL 103 06/29/2023   CREATININE 0.69 06/29/2023   BUN 17 06/29/2023   CO2 29 06/29/2023   TSH 1.57 09/01/2022   INR 1.0 10/12/2021    DG Chest 2 View Result Date: 10/23/2022 CLINICAL DATA:  Left-sided chest pain EXAM: CHEST - 2 VIEW COMPARISON:  Chest radiograph dated 05/13/2021 FINDINGS: Normal lung volumes. No focal consolidations. No pleural effusion or pneumothorax. The heart size and mediastinal contours are within normal limits. No acute osseous abnormality. IMPRESSION: No active cardiopulmonary disease. Electronically Signed   By: Agustin Cree M.D.   On: 10/23/2022 09:57       Assessment & Plan:  Diarrhea, unspecified type Assessment & Plan: Persistent loose stool as outlined. Will check cbc and electrolytes, along with liver panel. Check KUB to confirm no retained stool, since she was questioning if emptying her bowels. Probiotics. Follow.   Orders: -      BASIC METABOLIC PANEL WITH eGFR -     GI pathogen panel by PCR, stool; Future -     C Difficile Quick Screen w PCR reflex; Future -     DG Abd 1 View; Future -     Hepatic function panel -     CBC with Differential/Platelet  Rib pain on right side Assessment & Plan: Has noticed right side pain as outlined. Tender to palpation. Appears to be more msk. Continue tylenol. Follow.  Call with update. No pain with deep breathing.   Orders: -     DG Ribs Unilateral Right; Future  Supraventricular tachycardia [facility]) Assessment & Plan: Sees Dr Mariah Milling. On metoprolol 25mg .  Follow.  Appears to be stable.    Stress Assessment & Plan: Has had to deal with increased stress. Family and work stress.  Notify me if feels needs any further intervention.  Follow.    Gastro-esophageal reflux disease without esophagitis Assessment & Plan: Was having increased acid reflux. Now taking protonix regularly. Follow.       Dale [place], MD

## 2023-06-30 LAB — CBC WITH DIFFERENTIAL/PLATELET
Absolute Lymphocytes: 2432 {cells}/uL (ref 850–3900)
Absolute Monocytes: 624 {cells}/uL (ref 200–950)
Basophils Absolute: 64 {cells}/uL (ref 0–200)
Basophils Relative: 0.8 %
Eosinophils Absolute: 272 {cells}/uL (ref 15–500)
Eosinophils Relative: 3.4 %
HCT: 42.4 % (ref 35.0–45.0)
Hemoglobin: 14.1 g/dL (ref 11.7–15.5)
MCH: 31.8 pg (ref 27.0–33.0)
MCHC: 33.3 g/dL (ref 32.0–36.0)
MCV: 95.5 fL (ref 80.0–100.0)
MPV: 10.4 fL (ref 7.5–12.5)
Monocytes Relative: 7.8 %
Neutro Abs: 4608 {cells}/uL (ref 1500–7800)
Neutrophils Relative %: 57.6 %
Platelets: 235 10*3/uL (ref 140–400)
RBC: 4.44 10*6/uL (ref 3.80–5.10)
RDW: 11.1 % (ref 11.0–15.0)
Total Lymphocyte: 30.4 %
WBC: 8 10*3/uL (ref 3.8–10.8)

## 2023-06-30 LAB — HEPATIC FUNCTION PANEL
AG Ratio: 1.8 (calc) (ref 1.0–2.5)
ALT: 9 U/L (ref 6–29)
AST: 14 U/L (ref 10–35)
Albumin: 4.2 g/dL (ref 3.6–5.1)
Alkaline phosphatase (APISO): 68 U/L (ref 37–153)
Bilirubin, Direct: 0.1 mg/dL (ref 0.0–0.2)
Globulin: 2.4 g/dL (ref 1.9–3.7)
Indirect Bilirubin: 0.3 mg/dL (ref 0.2–1.2)
Total Bilirubin: 0.4 mg/dL (ref 0.2–1.2)
Total Protein: 6.6 g/dL (ref 6.1–8.1)

## 2023-06-30 LAB — BASIC METABOLIC PANEL WITH GFR
BUN: 17 mg/dL (ref 7–25)
CO2: 29 mmol/L (ref 20–32)
Calcium: 9.7 mg/dL (ref 8.6–10.4)
Chloride: 103 mmol/L (ref 98–110)
Creat: 0.69 mg/dL (ref 0.50–1.05)
Glucose, Bld: 80 mg/dL (ref 65–99)
Potassium: 4.7 mmol/L (ref 3.5–5.3)
Sodium: 141 mmol/L (ref 135–146)
eGFR: 96 mL/min/{1.73_m2} (ref >=60)

## 2023-07-03 ENCOUNTER — Encounter: Payer: Self-pay | Admitting: Internal Medicine

## 2023-07-06 ENCOUNTER — Other Ambulatory Visit: Payer: Self-pay | Admitting: Internal Medicine

## 2023-07-06 ENCOUNTER — Other Ambulatory Visit
Admission: RE | Admit: 2023-07-06 | Discharge: 2023-07-06 | Disposition: A | Discharge status: Home / Self Care | Source: Ambulatory Visit | Attending: Internal Medicine | Admitting: Internal Medicine

## 2023-07-06 DIAGNOSIS — R197 Diarrhea, unspecified: Secondary | ICD-10-CM | POA: Diagnosis present

## 2023-07-06 LAB — GASTROINTESTINAL PANEL BY PCR, STOOL (REPLACES STOOL CULTURE)

## 2023-07-06 LAB — C DIFFICILE QUICK SCREEN W PCR REFLEX
C Diff antigen: NEGATIVE
C Diff interpretation: NOT DETECTED
C Diff toxin: NEGATIVE

## 2023-07-06 NOTE — Telephone Encounter (Signed)
 Copied from CRM [id number]. Topic: Clinical - Medication Refill >> Jul 06, 2023 11:08 AM Truddie Crumble wrote: Most Recent Primary Care Visit:  Provider: Dale [place]  Department: [facility]  Visit Type: OFFICE VISIT  Date: 06/29/2023  Medication: estradiol (ESTRACE) 0.1 MG/GM vaginal cream  Has the patient contacted their pharmacy? Yes (Agent: If no, request that the patient contact the pharmacy for the refill. If patient does not wish to contact the pharmacy document the reason why and proceed with request.) (Agent: If yes, when and what did the pharmacy advise?)  Is this the correct pharmacy for this prescription? Yes If no, delete pharmacy and type the correct one.  This is the patient's preferred pharmacy:  [id number] - [place], [place] - [address] AT [facility] [address] [place] [address] Phone: [phone] Fax: [phone]   Has the prescription been filled recently? No  Is the patient out of the medication? Yes  Has the patient been seen for an appointment in the last year OR does the patient have an upcoming appointment? Yes  Can we respond through MyChart? Yes  Agent: Please be advised that Rx refills may take up to 3 business days. We ask that you follow-up with your pharmacy.

## 2023-07-08 ENCOUNTER — Encounter: Payer: Self-pay | Admitting: Internal Medicine

## 2023-07-08 NOTE — Assessment & Plan Note (Signed)
 Has had to deal with increased stress. Family and work stress.  Notify me if feels needs any further intervention.  Follow.

## 2023-07-08 NOTE — Assessment & Plan Note (Signed)
 Persistent loose stool as outlined. Will check cbc and electrolytes, along with liver panel. Check KUB to confirm no retained stool, since she was questioning if emptying her bowels. Probiotics. Follow.

## 2023-07-08 NOTE — Assessment & Plan Note (Signed)
 Sees Dr Mariah Milling. On metoprolol 25mg .  Follow.  Appears to be stable.

## 2023-07-08 NOTE — Assessment & Plan Note (Signed)
 Has noticed right side pain as outlined. Tender to palpation. Appears to be more msk. Continue tylenol. Follow.  Call with update. No pain with deep breathing.

## 2023-07-08 NOTE — Assessment & Plan Note (Signed)
 Was having increased acid reflux. Now taking protonix regularly. Follow.

## 2023-07-09 ENCOUNTER — Other Ambulatory Visit: Payer: Self-pay

## 2023-07-09 MED ORDER — ESTRADIOL 0.1 MG/GM VA CREA: 1.0000 | TOPICAL_CREAM | Freq: Every day | VAGINAL | 1 refills | Status: DC

## 2023-07-12 ENCOUNTER — Telehealth: Payer: Self-pay | Admitting: Internal Medicine

## 2023-07-12 NOTE — Telephone Encounter (Signed)
 Good afternoon,   Dr Lorin Picket will not be in the office the day of your scheduled visit, Friday 3-28.  Please call the office at [phone] and we will get you resheduled.   Thank you  E2C2, please reschedule this patient's 3 mo follow-up visit. [facility]

## 2023-07-18 ENCOUNTER — Telehealth: Payer: Self-pay | Admitting: Internal Medicine

## 2023-07-18 NOTE — Telephone Encounter (Signed)
 MyChart message:  You are correct, no appointment for tomorrow. Dr Lorin Picket is out of the office tomorrow and Friday. You can reschedule your appointment through MyChart of call the office. I will also forward your message to Dr Roby Lofts CMA about your prescription.  This MyChart message has not been read.  Duke Salvia Petkus  P [facility] [place] Admin (supporting Dale [place], MD)36 minutes ago (11:52 AM)   I am just now seeing this message and I assuming my appointment for tomorrow has been cancelled.  Please confirm.  Also the estradiol cream was called in but the pharmacy sent a message back to the provider needing directions.  I have't heard that the prescription is ready yet.  Can someone please check and let me know?     Thanks, [employer]

## 2023-07-18 NOTE — Telephone Encounter (Signed)
 Called [facility]. Prescription will be ready tomorrow. Patient is aware.

## 2023-07-20 ENCOUNTER — Telehealth: Payer: Self-pay

## 2023-07-20 ENCOUNTER — Ambulatory Visit: Payer: BC Managed Care – PPO | Admitting: Internal Medicine

## 2023-07-20 NOTE — Telephone Encounter (Signed)
 Copied from CRM [id number]. Topic: Clinical - Prescription Issue >> Jul 20, 2023  4:24 PM [name] wrote: Reason for CRM: pt called and stated that she went to pickup her prescription for estradiol and the pharmacist informed her that her insurance did not cover it due to the frequency being "daily". Please call and advise.

## 2023-07-23 NOTE — Telephone Encounter (Signed)
 Called pharmacy to clarify what is needed. Was placed on hold >10 minutes. Will make another attempt.

## 2023-07-24 ENCOUNTER — Telehealth: Payer: Self-pay

## 2023-07-24 MED ORDER — ESTRADIOL 0.1 MG/GM VA CREA: 1.0000 | TOPICAL_CREAM | VAGINAL | 1 refills | Status: AC

## 2023-07-24 NOTE — Telephone Encounter (Signed)
 Discussed with patient and Dr Lorin Picket. New rx sent in with new directions

## 2023-07-24 NOTE — Telephone Encounter (Signed)
 See other message. New rx sent in

## 2023-07-24 NOTE — Addendum Note (Signed)
 Addended by: Rita Ohara D on: 07/24/2023 10:23 AM   Modules accepted: Orders

## 2023-07-24 NOTE — Telephone Encounter (Signed)
 Copied from CRM [id number]. Topic: Clinical - Medication Question >> Jul 23, 2023  4:48 PM Melissa C wrote: Reason for CRM: Pharmacist calling regarding estradiol (ESTRACE) 0.1 MG/GM vaginal cream. Pharmacist is asking for more specific instructions per doctor for patient as pharmacist stated that generally usage is gradually dropped for patients. Please contact pharmacist at [phone] to advise on further directions for medication.

## 2023-07-27 ENCOUNTER — Ambulatory Visit: Admitting: Internal Medicine

## 2023-08-24 ENCOUNTER — Ambulatory Visit: Payer: BC Managed Care – PPO | Attending: Cardiology | Admitting: Cardiology

## 2023-08-24 ENCOUNTER — Encounter: Payer: Self-pay | Admitting: Cardiology

## 2023-08-24 VITALS — BP 110/70 | HR 76 | Ht 66.5 in | Wt 145.4 lb

## 2023-08-24 DIAGNOSIS — I493 Ventricular premature depolarization: Secondary | ICD-10-CM | POA: Diagnosis not present

## 2023-08-24 DIAGNOSIS — I4719 Other supraventricular tachycardia: Secondary | ICD-10-CM | POA: Diagnosis not present

## 2023-08-24 DIAGNOSIS — I471 Supraventricular tachycardia, unspecified: Secondary | ICD-10-CM | POA: Diagnosis not present

## 2023-08-24 NOTE — Progress Notes (Signed)
 Electrophysiology Clinic Note    Date:  08/24/2023  Patient ID:  Theresa, Buck 1956/03/16, MRN [id number] PCP:  Dellar Fenton, MD  Cardiologist:  Belva Boyden, MD Electrophysiologist: Boyce Byes, MD   Discussed the use of AI scribe software for clinical note transcription with the patient, who gave verbal consent to proceed.   Patient Profile    Chief Complaint: SVT, PVC follow-up  History of Present Illness: Theresa Buck is a 68 y.o. female with PMH notable for SVT, AVNRT, PVCs, palpitations; seen today for Boyce Byes, MD for routine electrophysiology followup.   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   She last saw Dr. Marven Slimmer 06/2021, where she continued to have brief episodes of palpitations. He favored mgmt with BB versus repeat EPS. I last saw her 02/2023 where she had no recent palpitation episodes and requested to stop metop. We agreed to stop BB and update 2 week monitor to confirm ongoing suppression of PVCs and SVT.  On follow-up today, she shares that when she stopped BB, she felt her heart beating hard in her chest more often and did not like the sensation so restarted her BB. Since restarting, she has not felt any fast heart rhythms or "heart beating hard in chest" sensations.  She continues to heal from her hip surgery and is overall frustrated that she has not made more functional improvements.   She denies chest pain, chest pressure, lower extremity edema, DOE.    Arrhythmia/Device History No specialty comments available.     ROS:  Please see the history of present illness. All other systems are reviewed and otherwise negative.    Physical Exam    VS:  BP 110/70 (BP Location: Left Arm, Patient Position: Sitting, Cuff Size: Normal)   Pulse 76   Ht 5' 6.5" (1.689 m)   Wt 145 lb 6.4 oz (66 kg)   LMP 07/15/2012   SpO2 97%   BMI 23.12 kg/m  BMI: Body mass index is 23.12 kg/m.  Wt Readings from Last 3 Encounters:   08/24/23 145 lb 6.4 oz (66 kg)  06/29/23 143 lb 10.1 oz (65.2 kg)  02/23/23 144 lb 3.2 oz (65.4 kg)     GEN- The patient is well appearing, alert and oriented x 3 today.   Lungs- Clear to ausculation bilaterally, normal work of breathing.  Heart- Regular rate and rhythm, no murmurs, rubs or gallops Extremities- No peripheral edema, warm, dry    Studies Reviewed   Previous EP, cardiology notes.    EKG is ordered. Personal review of EKG from today shows:    EKG Interpretation Date/Time:  Friday Aug 24 2023 14:41:56 EDT Ventricular Rate:  73 PR Interval:  156 QRS Duration:  132 QT Interval:  426 QTC Calculation: 469 R Axis:   -11  Text Interpretation: Normal sinus rhythm Non-specific intra-ventricular conduction block Minimal voltage criteria for LVH, may be normal variant ( Cornell product ) Confirmed by Nikeya Maxim [phone]) on 08/24/2023 2:44:44 PM     3 pages of Rhythm strip - 1 single PVC  Long term monitor, 03/16/2023 HR 51 - 222, average 78 bpm. 26 nonsustained SVT, longest 14 beats. Rare supraventricular ectopy. Occasional ventricular ectopy, 3.9%. No atrial fibrillation. No sustained arrhythmias.  CT Coronary, 12/08/2021 1. Coronary calcium score of 27.6. This was 70th percentile for age and sex matched control.  2. Normal coronary origin with right dominance.  3. Minimal stenosis (<25%) in the proximal  LAD.  4. CAD-RADS 1. Minimal non-obstructive CAD (0-24%). Consider non-atherosclerotic causes of chest pain. Consider preventive therapy and risk factor modification.   Long term monitor, 06/20/2021 HR 50 - 200 bpm, average 72 bpm. 3 NSVT, longest 4 beats with rate of 148 bpm. 18 SVT, longest 11 beats with a rate of 139 bpm. Rhythm strips suggest atrial tachycardia. Rare supraventricular and occasional ventricular ectopy. Symptom triggered episodes correspond to sinus rhythm.   TTE, 06/17/2021  1. Left ventricular ejection fraction, by estimation, is 55 to 60%.  The left ventricle has normal function. The left ventricle has no regional wall motion abnormalities. Left ventricular diastolic parameters are consistent with Grade I diastolic dysfunction (impaired relaxation). The average left ventricular global longitudinal strain is -17.6 %.   2. Right ventricular systolic function is normal. The right ventricular size is normal. There is normal pulmonary artery systolic pressure. The estimated right ventricular systolic pressure is 24.4 mmHg.   3. The mitral valve is normal in structure. Mild mitral valve regurgitation. No evidence of mitral stenosis.   4. The aortic valve is tricuspid. Aortic valve regurgitation is not visualized. No aortic stenosis is present.   5. The inferior vena cava is normal in size with greater than 50% respiratory variability, suggesting right atrial pressure of 3 mmHg.   Comparison(s): LVEF 45-50%.     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   The patient does not have concerns regarding her medicines.  The following changes were made today:  none  Labs/ tests ordered today include:  Orders Placed This Encounter  Procedures   EKG 12-Lead     Disposition: Follow up with Dr. Marven Slimmer or EP APP  PRN   Continue routine gen cards follow-up   Signed, Adaline Holly, NP  08/24/23  4:11 PM  Electrophysiology [facility]

## 2023-08-24 NOTE — Patient Instructions (Signed)
 Medication Instructions:  The current medical regimen is effective;  continue present plan and medications as directed. Please refer to the Current Medication list given to you today.   *If you need a refill on your cardiac medications before your next appointment, please call your pharmacy*   Follow-Up: At [facility], you and your health needs are our priority.  As part of our continuing mission to provide you with exceptional heart care, our providers are all part of one team.  This team includes your primary Cardiologist (physician) and Advanced Practice Providers or APPs (Physician Assistants and Nurse Practitioners) who all work together to provide you with the care you need, when you need it.  Your next appointment:   As needed  Provider:   Suzann Riddle, NP    We recommend signing up for the patient portal called "MyChart".  Sign up information is provided on this After Visit Summary.  MyChart is used to connect with patients for Virtual Visits (Telemedicine).  Patients are able to view lab/test results, encounter notes, upcoming appointments, etc.  Non-urgent messages can be sent to your provider as well.   To learn more about what you can do with MyChart, go to [email or web address].

## 2023-10-10 ENCOUNTER — Ambulatory Visit

## 2023-10-12 ENCOUNTER — Ambulatory Visit: Admitting: Internal Medicine

## 2023-10-23 ENCOUNTER — Telehealth: Payer: Self-pay

## 2023-10-23 DIAGNOSIS — M25512 Pain in left shoulder: Secondary | ICD-10-CM

## 2023-10-23 NOTE — Telephone Encounter (Signed)
 Spoke to pt she stated that it is her left shoulder and she would like the referral here in town

## 2023-10-23 NOTE — Telephone Encounter (Signed)
 I can place an order for a referral.  Please confirm which shoulder. Confirm she is ok to stay in town.

## 2023-10-23 NOTE — Telephone Encounter (Signed)
Order placed for ortho referral.   

## 2023-10-23 NOTE — Telephone Encounter (Signed)
 Spoke to pt she stated that she wants to know if you can send in referral for specialist I explained to her that you would need to see her first for the shoulder pain. She stated that she will just wait until her appt next month

## 2023-10-23 NOTE — Addendum Note (Signed)
 Addended by: GLENDIA ALLENA RAMAN on: 10/23/2023 01:38 PM   Modules accepted: Orders

## 2023-10-23 NOTE — Telephone Encounter (Signed)
 Copied from CRM [id number]. Topic: Referral - Question >> Oct 23, 2023  7:53 AM Mesmerise C wrote: Reason for CRM: Patient is inquiring about getting a referral for her shoulder that's bothering or states it hurts to move around would like to see someone before her appointment on 8/25 that specializes in checking that out patient can be reached at [phone]

## 2023-10-31 ENCOUNTER — Other Ambulatory Visit: Payer: Self-pay | Admitting: Orthopedic Surgery

## 2023-10-31 DIAGNOSIS — M545 Low back pain, unspecified: Secondary | ICD-10-CM

## 2023-11-09 ENCOUNTER — Ambulatory Visit
Admission: RE | Admit: 2023-11-09 | Discharge: 2023-11-09 | Disposition: A | Discharge status: Home / Self Care | Source: Ambulatory Visit | Attending: Orthopedic Surgery | Admitting: Orthopedic Surgery

## 2023-11-09 DIAGNOSIS — M545 Low back pain, unspecified: Secondary | ICD-10-CM

## 2023-11-30 ENCOUNTER — Ambulatory Visit: Admitting: Internal Medicine

## 2023-12-17 ENCOUNTER — Ambulatory Visit: Admitting: Internal Medicine

## 2023-12-17 VITALS — BP 122/70 | HR 75 | Resp 16 | Ht 66.5 in | Wt 145.4 lb

## 2023-12-17 DIAGNOSIS — M25512 Pain in left shoulder: Secondary | ICD-10-CM

## 2023-12-17 DIAGNOSIS — Z79818 Long term (current) use of other agents affecting estrogen receptors and estrogen levels: Secondary | ICD-10-CM

## 2023-12-17 DIAGNOSIS — K219 Gastro-esophageal reflux disease without esophagitis: Secondary | ICD-10-CM | POA: Diagnosis not present

## 2023-12-17 DIAGNOSIS — D649 Anemia, unspecified: Secondary | ICD-10-CM

## 2023-12-17 DIAGNOSIS — Z13220 Encounter for screening for lipoid disorders: Secondary | ICD-10-CM | POA: Diagnosis not present

## 2023-12-17 DIAGNOSIS — F439 Reaction to severe stress, unspecified: Secondary | ICD-10-CM

## 2023-12-17 DIAGNOSIS — I471 Supraventricular tachycardia, unspecified: Secondary | ICD-10-CM

## 2023-12-17 DIAGNOSIS — M5489 Other dorsalgia: Secondary | ICD-10-CM

## 2023-12-17 LAB — HEPATIC FUNCTION PANEL
ALT: 13 U/L (ref 0–35)
AST: 15 U/L (ref 0–37)
Albumin: 4.3 g/dL (ref 3.5–5.2)
Alkaline Phosphatase: 77 U/L (ref 39–117)
Bilirubin, Direct: 0.1 mg/dL (ref 0.0–0.3)
Total Bilirubin: 0.6 mg/dL (ref 0.2–1.2)
Total Protein: 6.8 g/dL (ref 6.0–8.3)

## 2023-12-17 LAB — BASIC METABOLIC PANEL WITH GFR
BUN: 15 mg/dL (ref 6–23)
CO2: 31 meq/L (ref 19–32)
Calcium: 9.3 mg/dL (ref 8.4–10.5)
Chloride: 103 meq/L (ref 96–112)
Creatinine, Ser: 0.7 mg/dL (ref 0.40–1.20)
GFR: 89.83 mL/min (ref >60.00)
Glucose, Bld: 99 mg/dL (ref 70–99)
Potassium: 4.1 meq/L (ref 3.5–5.1)
Sodium: 142 meq/L (ref 135–145)

## 2023-12-17 LAB — CBC WITH DIFFERENTIAL/PLATELET
Basophils Absolute: 0 K/uL (ref 0.0–0.1)
Basophils Relative: 0.7 % (ref 0.0–3.0)
Eosinophils Absolute: 0.2 K/uL (ref 0.0–0.7)
Eosinophils Relative: 3.5 % (ref 0.0–5.0)
HCT: 41.9 % (ref 36.0–46.0)
Hemoglobin: 13.9 g/dL (ref 12.0–15.0)
Lymphocytes Relative: 25.5 % (ref 12.0–46.0)
Lymphs Abs: 1.4 K/uL (ref 0.7–4.0)
MCHC: 33.2 g/dL (ref 30.0–36.0)
MCV: 95.9 fl (ref 78.0–100.0)
Monocytes Absolute: 0.5 K/uL (ref 0.1–1.0)
Monocytes Relative: 9 % (ref 3.0–12.0)
Neutro Abs: 3.2 K/uL (ref 1.4–7.7)
Neutrophils Relative %: 61.3 % (ref 43.0–77.0)
Platelets: 212 K/uL (ref 150.0–400.0)
RBC: 4.37 Mil/uL (ref 3.87–5.11)
RDW: 12.5 % (ref 11.5–15.5)
WBC: 5.3 K/uL (ref 4.0–10.5)

## 2023-12-17 LAB — LIPID PANEL
Cholesterol: 174 mg/dL (ref 0–200)
HDL: 50.9 mg/dL (ref >39.00)
LDL Cholesterol: 108 mg/dL — ABNORMAL HIGH (ref 0–99)
NonHDL: 122.78
Total CHOL/HDL Ratio: 3
Triglycerides: 74 mg/dL (ref 0.0–149.0)
VLDL: 14.8 mg/dL (ref 0.0–40.0)

## 2023-12-17 LAB — TSH: TSH: 1.53 u[IU]/mL (ref 0.35–5.50)

## 2023-12-17 MED ORDER — PROGESTERONE MICRONIZED 100 MG PO CAPS: 100.0000 mg | ORAL_CAPSULE | Freq: Every day | ORAL | 1 refills | Status: DC

## 2023-12-17 MED ORDER — ESTRADIOL 1 MG PO TABS: ORAL_TABLET | ORAL | 1 refills | Status: AC

## 2023-12-17 MED ORDER — TRAZODONE HCL 50 MG PO TABS: ORAL_TABLET | ORAL | 1 refills | Status: AC

## 2023-12-17 MED ORDER — PANTOPRAZOLE SODIUM 20 MG PO TBEC: 20.0000 mg | DELAYED_RELEASE_TABLET | Freq: Every day | ORAL | 1 refills | Status: AC | PRN

## 2023-12-17 NOTE — Progress Notes (Signed)
 Subjective:    Patient ID: Theresa Buck, female    DOB: 1956-03-09, 68 y.o.   MRN: [id number]  Patient here for  Chief Complaint  Patient presents with   Medical Management of Chronic Issues    HPI Here for a scheduled follow up - follow up regarding SVT, increased stress and reflux. Was having issues with increased loose stool. Improved. 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 Continue 12.5mg  toprol  daily. Recently evaluated for low back pain and left hip pain. MRI 11/09/23 - reviewed. Still with pain - left lateral hip/upper thigh to knee. Does not feel the pain is internal. Doing stretches and dry needling. Limits her activity. Notices when walks or dances. Also having issues with left shoulder and posterior neck. Has appt to see Dr Edie 01/21/24. No chest pain or sob reported. Feels from a heart standpoint - stable. No abdominal pain or bowel issues reported now. Still with increased stress. Discussed.    Past Medical History:  Diagnosis Date   Allergy    recurring sinus problems   Anxiety    Arthritis    Chronic back pain    Deviated septum    Deviated septum    GERD (gastroesophageal reflux disease)    H/O sinusitis    Headache    H/O MIGRAINES   Hx of degenerative disc disease    Scoliosis    SVT (supraventricular tachycardia) (HCC)    Past Surgical History:  Procedure Laterality Date   BACK SURGERY  09/26/2013   LUMBAR   COLONOSCOPY  08/03/2015   Dr Viktoria   HEMORRHOID SURGERY N/A 03/13/2017   Procedure: HEMORRHOIDECTOMY;  Surgeon: Dellie Louanne MATSU, MD;  Location: [facility] ORS;  Service: General;  Laterality: N/A;   SVT ABLATION N/A 09/16/2020   Procedure: SVT ABLATION;  Surgeon: Cindie Ole DASEN, MD;  Location: [facility] INVAS[facility] CV LAB;  Service: Cardiovascular;  Laterality: N/A;   TOTAL HIP ARTHROPLASTY Left 10/13/2021   Procedure: TOTAL HIP ARTHROPLASTY ANTERIOR APPROACH;  Surgeon: Kathlynn Sharper, MD;  Location: [facility]  ORS;  Service: Orthopedics;  Laterality: Left;   UPPER GI ENDOSCOPY  08/03/2015   Dr Viktoria   Family History  Problem Relation Age of Onset   Brain cancer Mother    ALS Father    Diabetes Brother        multiple   Heart disease Son        myocardial infarction, s/p CABG   Throat cancer Brother    Breast cancer Neg Hx    Social History   Socioeconomic History   Marital status: Widowed    Spouse name: Not on file   Number of children: 3   Years of education: Not on file   Highest education level: Not on file  Occupational History   Not on file  Tobacco Use   Smoking status: Never   Smokeless tobacco: Never  Vaping Use   Vaping status: Never Used  Substance and Sexual Activity   Alcohol  use: No    Alcohol /week: 0.0 standard drinks of alcohol    Drug use: No   Sexual activity: Not on file  Other Topics Concern   Not on file  Social History Narrative   Not on file   Social Drivers of Health   Financial Resource Strain: Not on file  Food Insecurity: Not on file  Transportation Needs: Not on file  Physical Activity: Not on file  Stress: Not on file  Social Connections: Not on file     Review of Systems  Constitutional:  Negative for appetite change and unexpected weight change.  HENT:  Negative for congestion and sinus pressure.   Respiratory:  Negative for cough, chest tightness and shortness of breath.   Cardiovascular:  Negative for chest pain, palpitations and leg swelling.  Gastrointestinal:  Negative for abdominal pain, diarrhea, nausea and vomiting.  Genitourinary:  Negative for difficulty urinating and dysuria.  Musculoskeletal:  Negative for myalgias.       Left lateral hip and leg pain as outlined.   Skin:  Negative for color change and rash.  Neurological:  Negative for dizziness.       No significant headache.   Psychiatric/Behavioral:  Negative for agitation and dysphoric mood.        Objective:     BP 122/70   Pulse 75   Resp 16   Ht 5'  6.5 (1.689 m)   Wt 145 lb 6.4 oz (66 kg)   LMP 07/15/2012   SpO2 99%   BMI 23.12 kg/m  Wt Readings from Last 3 Encounters:  12/17/23 145 lb 6.4 oz (66 kg)  08/24/23 145 lb 6.4 oz (66 kg)  06/29/23 143 lb 10.1 oz (65.2 kg)    Physical Exam Vitals reviewed.  Constitutional:      General: She is not in acute distress.    Appearance: Normal appearance.  HENT:     Head: Normocephalic and atraumatic.     Right Ear: External ear normal.     Left Ear: External ear normal.     Mouth/Throat:     Pharynx: No oropharyngeal exudate or posterior oropharyngeal erythema.  Eyes:     General: No scleral icterus.       Right eye: No discharge.        Left eye: No discharge.     Conjunctiva/sclera: Conjunctivae normal.  Neck:     Thyroid : No thyromegaly.  Cardiovascular:     Rate and Rhythm: Normal rate and regular rhythm.  Pulmonary:     Effort: No respiratory distress.     Breath sounds: Normal breath sounds. No wheezing.  Abdominal:     General: Bowel sounds are normal.     Palpations: Abdomen is soft.     Tenderness: There is no abdominal tenderness.  Musculoskeletal:        General: No swelling.     Cervical back: Neck supple. No tenderness.     Comments: Minimal pain - left inner thigh with abduction of left lower leg. Negative SLR.   Lymphadenopathy:     Cervical: No cervical adenopathy.  Skin:    Findings: No erythema or rash.  Neurological:     Mental Status: She is alert.  Psychiatric:        Mood and Affect: Mood normal.        Behavior: Behavior normal.         Outpatient Encounter Medications as of 12/17/2023  Medication Sig   Biotin 5000 MCG TABS Take 5,000 mcg by mouth daily.   Calcium Carb-Cholecalciferol (CALCIUM + D3 PO) Take 1 tablet daily by mouth.   carboxymethylcellulose (REFRESH PLUS) 0.5 % SOLN Place 1 drop into both eyes 3 (three) times daily as needed (dry eyes).   clobetasol  cream (TEMOVATE ) 0.05 % Apply to bumps on right side twice daily until  improved. Avoid face, groin, axilla.   estradiol  (ESTRACE ) 0.1 MG/GM vaginal cream Place 1 Applicatorful vaginally 2 (two) times a week.  hydrocortisone  (ANUSOL -HC) 25 MG suppository Place 1 suppository (25 mg total) rectally 2 (two) times daily.   metoprolol  succinate (TOPROL -XL) 25 MG 24 hr tablet Take 12.5 mg by mouth daily.   mometasone  (ELOCON ) 0.1 % lotion Apply to affected area scalp once to twice daily until improved.   montelukast  (SINGULAIR ) 10 MG tablet Take 10 mg at bedtime by mouth.    Multiple Vitamin (MULTIVITAMIN) tablet Take 1 tablet by mouth daily.   vitamin C (ASCORBIC ACID) 500 MG tablet Take 500 mg by mouth daily.   estradiol  (ESTRACE ) 1 MG tablet 1/2 tablet q day.   pantoprazole  (PROTONIX ) 20 MG tablet Take 1 tablet (20 mg total) by mouth daily as needed.   progesterone  (PROMETRIUM ) 100 MG capsule Take 1 capsule (100 mg total) by mouth daily.   traZODone  (DESYREL ) 50 MG tablet TAKE 1/2 TO 1 TABLET(25 TO 50 MG) BY MOUTH AT BEDTIME AS NEEDED FOR SLEEP   [DISCONTINUED] estradiol  (ESTRACE ) 1 MG tablet 1/2 tablet q day.   [DISCONTINUED] pantoprazole  (PROTONIX ) 20 MG tablet TAKE 1 TABLET(20 MG) BY MOUTH DAILY AS NEEDED   [DISCONTINUED] progesterone  (PROMETRIUM ) 100 MG capsule Take 1 capsule (100 mg total) by mouth daily.   [DISCONTINUED] traZODone  (DESYREL ) 50 MG tablet TAKE 1/2 TO 1 TABLET(25 TO 50 MG) BY MOUTH AT BEDTIME AS NEEDED FOR SLEEP   No facility-administered encounter medications on file as of 12/17/2023.     Lab Results  Component Value Date   WBC 5.3 12/17/2023   HGB 13.9 12/17/2023   HCT 41.9 12/17/2023   PLT 212.0 12/17/2023   GLUCOSE 99 12/17/2023   CHOL 174 12/17/2023   TRIG 74.0 12/17/2023   HDL 50.90 12/17/2023   LDLCALC 108 (H) 12/17/2023   ALT 13 12/17/2023   AST 15 12/17/2023   NA 142 12/17/2023   K 4.1 12/17/2023   CL 103 12/17/2023   CREATININE 0.70 12/17/2023   BUN 15 12/17/2023   CO2 31 12/17/2023   TSH 1.53 12/17/2023   INR 1.0  10/12/2021    MR LUMBAR SPINE WO CONTRAST Result Date: 11/12/2023 CLINICAL DATA:  Chronic low back pain with left hip pain EXAM: MRI LUMBAR SPINE WITHOUT CONTRAST TECHNIQUE: Multiplanar, multisequence MR imaging of the lumbar spine was performed. No intravenous contrast was administered. COMPARISON:  Radiography 11/27/2013.  MRI 09/26/2013. FINDINGS: Segmentation:  5 lumbar type vertebral bodies. Alignment: Scoliotic curvature convex to the right with the apex at L2. Straightening of the normal lumbar lordosis. Vertebrae: Degenerative endplate marrow edema at the L3-4 level which could relate to regional pain. Conus medullaris and cauda equina: Conus extends to the L1 level. Conus and cauda equina appear normal. Paraspinal and other soft tissues: Negative Disc levels: T12-L1: Normal L1-2: Loss of disc height. Endplate osteophytes and mild bulging of the disc. Mild facet hypertrophy on the left. Mild left lateral recess stenosis but no likely neural compression. L2-3: Endplate osteophytes and bulging of the disc more prominent towards the left. Facet degeneration and hypertrophy worse on the left. Mild stenosis of the left lateral recess and intervertebral foramen on the left but without definite neural compression. Slight worsening since 2015. L3-4: Disc degeneration with loss of disc height. Endplate osteophytes and bulging of the disc. Endplate edema as noted above, which could be painful. Facet and ligamentous hypertrophy. Mild stenosis of both lateral recesses and neural foramina, left worse than right. Findings at this level have worsened since 2015. L4-5: Endplate osteophytes and bulging of the disc more prominent towards the  right. Facet degeneration worse on the right. Mild stenosis of the right lateral recess and moderate stenosis of the foramen on the right. Some potential for right-sided neural compression. Slight worsening since 2015. L5-S1: Interval left hemilaminectomy since the study of 2015. Mild  bulging of the disc but no recurrent disc herniation or compressive stenosis. Right L5 and S1 root sleeves are conjoined as seen previously. IMPRESSION: 1. Scoliotic curvature convex to the right with the apex at L2. Straightening of the normal lumbar lordosis. 2. L1-2: Mild left lateral recess stenosis but no likely neural compression. 3. L2-3: Mild left lateral recess and foraminal stenosis but without definite neural compression. Slight worsening since 2015. 4. L3-4: Degenerative endplate marrow edema which could be painful. Mild stenosis of both lateral recesses and neural foramina, left worse than right. Findings at this level have worsened since 2015. 5. L4-5: Mild stenosis of the right lateral recess and moderate stenosis of the foramen on the right. Some potential for right-sided neural compression. Slight worsening since 2015. 6. L5-S1: Interval left hemilaminectomy since the study of 2015. No recurrent disc herniation or compressive stenosis. Right L5 and S1 root sleeves are conjoined as seen previously. Electronically Signed   By: Oneil Officer M.D.   On: 11/12/2023 11:39       Assessment & Plan:  Gastro-esophageal reflux disease without esophagitis Assessment & Plan: Continues on protonix . Upper symptoms controlled. No upper symptoms reported.    Screening cholesterol level -     Lipid panel  Supraventricular tachycardia [facility]) Assessment & Plan: Sees Dr Gollan. On metoprolol  25mg .  Appears to be stable. Follow.   Orders: -     Hepatic function panel -     Basic metabolic panel with GFR  Anemia, unspecified type -     TSH -     CBC with Differential/Platelet  Current use of estrogen therapy Assessment & Plan: Have previously discussed slow taper. Follow.   Orders: -     Progesterone ; Take 1 capsule (100 mg total) by mouth daily.  Dispense: 90 capsule; Refill: 1  Stress Assessment & Plan: Has had to deal with increased stress. Family and work stress.  Also stress with her  health issues. Discussed. Notify me if feels needs any further intervention.  Follow.    Other back pain, unspecified chronicity Assessment & Plan:  Recently evaluated for low back pain and left hip pain. MRI 11/09/23 - reviewed. Still with pain - left lateral hip/upper thigh to knee. Does not feel the pain is internal. Doing stretches and dry needling. Limits her activity. Notices when walks or dances. Discussed. Continue f/u with surgery.    Left shoulder pain, unspecified chronicity Assessment & Plan: Also having issues with left shoulder and posterior neck. Has appt to see Dr Edie 01/21/24.    Other orders -     Estradiol ; 1/2 tablet q day.  Dispense: 45 tablet; Refill: 1 -     Pantoprazole  Sodium; Take 1 tablet (20 mg total) by mouth daily as needed.  Dispense: 90 tablet; Refill: 1 -     traZODone  HCl; TAKE 1/2 TO 1 TABLET(25 TO 50 MG) BY MOUTH AT BEDTIME AS NEEDED FOR SLEEP  Dispense: 30 tablet; Refill: 1     Allena Hamilton, MD

## 2023-12-19 ENCOUNTER — Ambulatory Visit: Payer: Self-pay | Admitting: Internal Medicine

## 2023-12-24 ENCOUNTER — Encounter: Payer: Self-pay | Admitting: Internal Medicine

## 2023-12-24 DIAGNOSIS — M25519 Pain in unspecified shoulder: Secondary | ICD-10-CM | POA: Insufficient documentation

## 2023-12-24 NOTE — Assessment & Plan Note (Signed)
 Recently evaluated for low back pain and left hip pain. MRI 11/09/23 - reviewed. Still with pain - left lateral hip/upper thigh to knee. Does not feel the pain is internal. Doing stretches and dry needling. Limits her activity. Notices when walks or dances. Discussed. Continue f/u with surgery.

## 2023-12-24 NOTE — Assessment & Plan Note (Signed)
 Sees Dr Gollan. On metoprolol  25mg .  Appears to be stable. Follow.

## 2023-12-24 NOTE — Assessment & Plan Note (Signed)
 Continues on protonix . Upper symptoms controlled. No upper symptoms reported.

## 2023-12-24 NOTE — Assessment & Plan Note (Signed)
 Have previously discussed slow taper. Follow.

## 2023-12-24 NOTE — Assessment & Plan Note (Signed)
 Also having issues with left shoulder and posterior neck. Has appt to see Dr Edie 01/21/24.

## 2023-12-24 NOTE — Assessment & Plan Note (Signed)
 Has had to deal with increased stress. Family and work stress.  Also stress with her health issues. Discussed. Notify me if feels needs any further intervention.  Follow.

## 2024-01-02 ENCOUNTER — Ambulatory Visit: Payer: Self-pay

## 2024-01-02 NOTE — Telephone Encounter (Signed)
 Reviewed note. Given increased pain and request for pain medication, recommend evaluation so that I can determine best treatment. I can work her in tomorrow (01/03/24).

## 2024-01-02 NOTE — Telephone Encounter (Signed)
 FYI Only or Action Required?: Action required by provider: clinical question for provider.  Patient was last seen in primary care on 12/17/2023 by Glendia Shad, MD.  Called Nurse Triage reporting Shoulder Pain.  Symptoms began several months ago.  Interventions attempted: Nothing.  Symptoms are: gradually worsening. Pt. Has orthopedic appointment 01/21/24. Asking for something for pain, has had Tramadol in the past. Please advise pt.  Triage Disposition: See PCP When Office is Open (Within 3 Days)  Patient/caregiver understands and will follow disposition?: No, wishes to speak with PCP   Copied from CRM #[id number]. Topic: Clinical - Red Word Triage >> Jan 02, 2024  7:39 AM Carlyon D wrote: Red Word that prompted transfer to Nurse Triage: severe left shoulder pain, pt has no idea what happened states its just been getting progressively worse. Reason for Disposition  [1] MODERATE pain (e.g., interferes with normal activities) AND [2] present > 3 days  Answer Assessment - Initial Assessment Questions 1. ONSET: When did the pain start?     2-3 months 2. LOCATION: Where is the pain located?     Left 3. PAIN: How bad is the pain? (Scale 1-10; or mild, moderate, severe)     Getting worse   4. WORK OR EXERCISE: Has there been any recent work or exercise that involved this part of the body?     no 5. CAUSE: What do you think is causing the shoulder pain?     unsure 6. OTHER SYMPTOMS: Do you have any other symptoms? (e.g., neck pain, swelling, rash, fever, numbness, weakness)     no 7. PREGNANCY: Is there any chance you are pregnant? When was your last menstrual period?     no  Protocols used: Shoulder Pain-A-AH

## 2024-01-02 NOTE — Telephone Encounter (Signed)
 Called patient to offer her an appointment. She says she does not need to see anyone because she has been seen for this before and she is seeing ortho the end of the month but has a vacation planned leaving Saturday so earlier appt with ortho will not be beneficial. She is using tylenol  and ibuprofen with very little relief. She is wanting to know if she could have a script for something to see if it will help with the pain until she gets back from her trip and sees Dr Edie. She does not tolerate pain medication but mentioned an anti-inflammatory or tramadol or something just to have short term.

## 2024-01-03 NOTE — Telephone Encounter (Signed)
 Called patient to offer appointment. She declined. Advised to call back if she changes her mind.

## 2024-01-24 ENCOUNTER — Other Ambulatory Visit: Payer: Self-pay | Admitting: *Deleted

## 2024-01-24 MED ORDER — METOPROLOL SUCCINATE ER 25 MG PO TB24: 12.5000 mg | ORAL_TABLET | Freq: Every day | ORAL | 3 refills | Status: AC

## 2024-01-24 NOTE — Progress Notes (Signed)
 Pt sent a message stating she had no more refills.  Refill sent in to [facility].

## 2024-03-28 ENCOUNTER — Ambulatory Visit: Admitting: Internal Medicine

## 2024-03-28 DIAGNOSIS — Z1231 Encounter for screening mammogram for malignant neoplasm of breast: Secondary | ICD-10-CM

## 2024-03-28 DIAGNOSIS — D649 Anemia, unspecified: Secondary | ICD-10-CM

## 2024-03-28 DIAGNOSIS — I471 Supraventricular tachycardia, unspecified: Secondary | ICD-10-CM

## 2024-03-28 DIAGNOSIS — Z13220 Encounter for screening for lipoid disorders: Secondary | ICD-10-CM

## 2024-03-28 DIAGNOSIS — Z79818 Long term (current) use of other agents affecting estrogen receptors and estrogen levels: Secondary | ICD-10-CM

## 2024-04-14 ENCOUNTER — Telehealth: Payer: Self-pay | Admitting: Internal Medicine

## 2024-04-14 NOTE — Telephone Encounter (Signed)
 Lm and MyChart message sent: There has been a change in Dr Sanmina-sci scheduled. Please call the office to reschedule your January the 06/06/2024 appointment.  E2C2 forward to office

## 2024-05-02 ENCOUNTER — Other Ambulatory Visit: Payer: Self-pay

## 2024-05-02 ENCOUNTER — Ambulatory Visit
Admission: RE | Admit: 2024-05-02 | Discharge: 2024-05-02 | Disposition: A | Attending: Internal Medicine | Admitting: Internal Medicine

## 2024-05-02 ENCOUNTER — Ambulatory Visit: Admitting: Internal Medicine

## 2024-05-02 ENCOUNTER — Ambulatory Visit
Admission: RE | Admit: 2024-05-02 | Discharge: 2024-05-02 | Disposition: A | Source: Ambulatory Visit | Attending: Internal Medicine

## 2024-05-02 ENCOUNTER — Encounter: Payer: Self-pay | Admitting: Internal Medicine

## 2024-05-02 VITALS — BP 120/80 | HR 88 | Temp 97.8°F | Resp 16 | Ht 66.5 in | Wt 143.1 lb

## 2024-05-02 DIAGNOSIS — R509 Fever, unspecified: Secondary | ICD-10-CM | POA: Diagnosis present

## 2024-05-02 DIAGNOSIS — R051 Acute cough: Secondary | ICD-10-CM

## 2024-05-02 MED ORDER — AMOXICILLIN-POT CLAVULANATE 875-125 MG PO TABS: 1.0000 | ORAL_TABLET | Freq: Two times a day (BID) | ORAL | 0 refills | Status: AC

## 2024-05-02 MED ORDER — BENZONATATE 100 MG PO CAPS: 100.0000 mg | ORAL_CAPSULE | Freq: Two times a day (BID) | ORAL | 0 refills | Status: AC | PRN

## 2024-05-02 MED ORDER — FLUCONAZOLE 150 MG PO TABS: 150.0000 mg | ORAL_TABLET | Freq: Once | ORAL | 0 refills | Status: AC

## 2024-05-02 NOTE — Progress Notes (Signed)
 "  Acute Office Visit  Subjective:     Patient ID: Theresa Buck, female    DOB: [birth year], 69 y.o.   MRN: [id number]  Chief Complaint  Patient presents with   Cough    1 week     Cough Associated symptoms include a fever. Pertinent negatives include no chills, ear pain, sore throat, shortness of breath or wheezing.   Patient is in today for cough x 1 week. This is my first time meeting her.   Discussed the use of AI scribe software for clinical note transcription with the patient, who gave verbal consent to proceed.  History of Present Illness Theresa Buck is a 69 year old female who presents with a persistent cough and sinus drainage.  She has had a productive cough for about a week with occasional small amounts of blood in the sputum and associated back discomfort from coughing. She had a fever up to 100.109F early in the illness, now resolved. She denies shortness of breath or wheezing.  She has chronic sinus problems and is now having sinus drainage that sometimes drips down her throat, without facial pain or ear pressure. She takes allergy medication for this.  She has not taken medication today. She took an over-the-counter cold and flu medication with Benadryl  at 3 AM and had difficulty waking up afterward. Her symptoms made it hard for her to work on Tuesday.  Her boyfriend has similar respiratory symptoms and is a likely sick contact. She was also out with a group on New Year's but is not aware of others being ill.    Review of Systems  Constitutional:  Positive for fever. Negative for chills.  HENT:  Positive for congestion. Negative for ear pain, sinus pain and sore throat.   Respiratory:  Positive for cough and sputum production. Negative for shortness of breath and wheezing.         Objective:    BP 120/80 (Cuff Size: Normal)   Pulse 88   Temp 97.8 F (36.6 C) (Oral)   Resp 16   Ht 5' 6.5 (1.689 m)   Wt 143 lb 1.6 oz (64.9 kg)   LMP 07/15/2012    SpO2 96%   BMI 22.75 kg/m  BP Readings from Last 3 Encounters:  05/02/24 120/80  12/17/23 122/70  08/24/23 110/70   Wt Readings from Last 3 Encounters:  05/02/24 143 lb 1.6 oz (64.9 kg)  12/17/23 145 lb 6.4 oz (66 kg)  08/24/23 145 lb 6.4 oz (66 kg)      Physical Exam Constitutional:      Appearance: Normal appearance.  HENT:     Head: Normocephalic and atraumatic.     Right Ear: Tympanic membrane, ear canal and external ear normal.     Left Ear: Tympanic membrane, ear canal and external ear normal.     Nose: Congestion present.     Mouth/Throat:     Mouth: Mucous membranes are moist.     Pharynx: Posterior oropharyngeal erythema present.  Eyes:     Conjunctiva/sclera: Conjunctivae normal.  Cardiovascular:     Rate and Rhythm: Normal rate and regular rhythm.  Pulmonary:     Effort: Pulmonary effort is normal.     Breath sounds: Rhonchi present.  Skin:    General: Skin is warm and dry.  Neurological:     General: No focal deficit present.     Mental Status: She is alert. Mental status is at baseline.  Psychiatric:  Mood and Affect: Mood normal.        Behavior: Behavior normal.     No results found for any visits on 05/02/24.      Assessment & Plan:   Assessment & Plan Acute upper respiratory infection Cough, sinus drainage, and low-grade fever for one week. Differential includes bronchitis, viral upper respiratory infection, and pneumonia. Chest x-ray ordered to rule out pneumonia. Augmentin  prescribed as a precautionary measure due to potential worsening of symptoms and delayed x-ray results. Discussed potential side effects of Augmentin , including diarrhea and yeast infections, and the use of probiotics to mitigate these effects. - Ordered chest x-ray at [facility] to rule out pneumonia. - Prescribed Augmentin  to be used if symptoms worsen or if chest x-ray indicates need. - Prescribed Teflon Pearls for cough. - Advised to  monitor for worsening symptoms such as increased fever, productive cough, or lack of improvement.  Risk for vaginal candidiasis due to antibiotic use Risk of vaginal candidiasis due to potential use of Augmentin . Discussed the use of probiotics to mitigate the risk of yeast infections. Diflucan  prescribed as a precautionary measure. - Prescribed Diflucan  for potential yeast infection. - Advised use of probiotics to prevent yeast infections.  - fluconazole  (DIFLUCAN ) 150 MG tablet; Take 1 tablet (150 mg total) by mouth once for 1 dose.  Dispense: 3 tablet; Refill: 0 - amoxicillin -clavulanate (AUGMENTIN ) 875-125 MG tablet; Take 1 tablet by mouth 2 (two) times daily for 5 days.  Dispense: 10 tablet; Refill: 0 - DG Chest 2 View; Future - benzonatate  (TESSALON ) 100 MG capsule; Take 1 capsule (100 mg total) by mouth 2 (two) times daily as needed for cough.  Dispense: 20 capsule; Refill: 0  Return if symptoms worsen or fail to improve.  Sharyle Fischer, DO   "

## 2024-05-08 NOTE — Progress Notes (Unsigned)
 "  Cardiology Office Note    Date:  05/09/2024   ID:  Theresa Buck, DOB Apr 21, 1956, MRN [id number]  PCP:  Glendia Shad, MD  Cardiologist:  Evalene Lunger, MD  Electrophysiologist:  OLE ONEIDA HOLTS, MD (Inactive)   Chief Complaint: Follow up  History of Present Illness:   Theresa Buck is a 69 y.o. 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    Patient was seen in the ED 06/2020 with acute onset tachycardia with associated shortness of breath, chest pain, and jaw pain.  She was noted to have wide-complex tachycardia up to 220 bpm.  EKG showed SVT, rhythm broke on its own.  Follow-up EKG showed normal sinus rhythm with PVCs echo with mildly reduced EF at 45 to 50%.  She was started on metoprolol  and referred to EP as an outpatient.  Calcium score of 12.6 which was 66 percentile for age and sex matched controls and no significant CAD.  ZIO monitor 07/2020 was without significant arrhythmia.  She underwent SVT ablation in 08/2020 with Dr. Holts.  She was seen in the ED 04/2021 for palpitations, similar to prior SVT.  Revealed predominantly normal rhythm with ED brief episodes of SVT.  Echo with EF improved to 55 to 60% with G1 DD.  Coronary CTA with calcium score of 27.6 which was 70th percentile for age and sex matched control.  Minimal nonobstructive CAD.  ZIO monitor 02/2023 revealed predominantly sinus rhythm with 26 episodes of SVT up to 14 beats and 4% PVC burden.  Patient triggered events were associated with single PVCs.   Patient was most recently seen in the cardiology clinic 11/2022 overall feeling well.  She was noted to have new left bundle branch block with no further ischemic evaluation recommended given no significant disease on coronary CTA in 2023.  She was most recently seen by EP 08/2023.  At that time, she reported not feeling well off of her  beta-blocker with increased sensation of heart beating in her chest.  Metoprolol  succinate was resumed at 12.5 mg daily.  Patient presents today overall doing well from a cardiac perspective. She reports that with the stress of her job, she was hesitant to stop her beta blocker. She denies palpitations concerning for SVT. She denies chest pain, shortness of breath, lightheadedness, dizziness, and lower extremity swelling.   Labs independently reviewed: 11/2023-Hgb 13.9, HCT 41.9, platelets 212, sodium 142, potassium 4.1, BUN 15, creatinine 0.7, TC 174, TG 74, HDL 50, LDL 108  Objective   Past Medical History:  Diagnosis Date   Allergy    recurring sinus problems   Anxiety    Arthritis    Chronic back pain    Deviated septum    Deviated septum    GERD (gastroesophageal reflux disease)    H/O sinusitis    Headache    H/O MIGRAINES   Hx of degenerative disc disease    Scoliosis    SVT (supraventricular tachycardia)     Current Medications: Active Medications[1]  Allergies:   Ceftin [cefuroxime axetil]   Social History   Socioeconomic History   Marital status: Widowed    Spouse name: Not on file   Number of children: 3   Years of education: Not on file   Highest education level: Not on file  Occupational History   Not on file  Tobacco Use   Smoking status:  Never   Smokeless tobacco: Never  Vaping Use   Vaping status: Never Used  Substance and Sexual Activity   Alcohol  use: No    Alcohol /week: 0.0 standard drinks of alcohol    Drug use: No   Sexual activity: Not on file  Other Topics Concern   Not on file  Social History Narrative   Not on file   Social Drivers of Health   Tobacco Use: Low Risk (05/09/2024)   Patient History    Smoking Tobacco Use: Never    Smokeless Tobacco Use: Never    Passive Exposure: Not on file  Financial Resource Strain: Low Risk  (02/25/2024)   Received from [facility]   Overall Financial Resource Strain (CARDIA)     Difficulty of Paying Living Expenses: Not hard at all  Food Insecurity: No Food Insecurity (02/25/2024)   Received from [facility]   Epic    Within the past 12 months, you worried that your food would run out before you got the money to buy more.: Never true    Within the past 12 months, the food you bought just didn't last and you didn't have money to get more.: Never true  Transportation Needs: No Transportation Needs (02/25/2024)   Received from [facility] - Transportation    In the past 12 months, has lack of transportation kept you from medical appointments or from getting medications?: No    Lack of Transportation (Non-Medical): No  Physical Activity: Not on file  Stress: Not on file  Social Connections: Not on file  Depression (PHQ2-9): Low Risk (12/17/2023)   Depression (PHQ2-9)    PHQ-2 Score: 0  Alcohol  Screen: Not on file  Housing: Low Risk  (02/25/2024)   Received from [facility]   Epic    In the last 12 months, was there a time when you were not able to pay the mortgage or rent on time?: No    In the past 12 months, how many times have you moved where you were living?: 0    At any time in the past 12 months, were you homeless or living in a shelter (including now)?: No  Utilities: Not At Risk (02/25/2024)   Received from [facility]   Epic    In the past 12 months has the electric, gas, oil, or water company threatened to shut off services in your home?: No  Health Literacy: Not on file     Family History:  The patient's family history includes ALS in her father; Brain cancer in her mother; Diabetes in her brother; Heart disease in her son; Throat cancer in her brother. There is no history of Breast cancer.  ROS:   12-point review of systems is negative unless otherwise noted in the HPI.  EKGs/Other Studies Reviewed:    Studies reviewed were summarized above. The additional studies  were reviewed today:  11/2021 Coronary CTA 1. Coronary calcium score of 27.6. This was 70th percentile for age and sex matched control. 2. Normal coronary origin with right dominance. 3. Minimal stenosis (<25%) in the proximal LAD. 4. CAD-RADS 1. Minimal non-obstructive CAD (0-24%). Consider non-atherosclerotic causes of chest pain. Consider preventive therapy and risk factor modification.  EKG:  EKG personally reviewed by me today EKG Interpretation Date/Time:  Friday May 09 2024 15:14:14 EST Ventricular Rate:  74 PR Interval:  146 QRS Duration:  128 QT Interval:  412 QTC Calculation: 457 R  Axis:   -32  Text Interpretation: Normal sinus rhythm Left axis deviation Nonspecific intraventricular conduction delay Nonspecific T wave abnormality Confirmed by Lorene Sinclair ([id number]) on 05/09/2024 3:16:34 PM  PHYSICAL EXAM:    VS:  BP 108/60 (BP Location: Left Arm, Patient Position: Sitting)   Pulse 74   Ht 5' (1.524 m)   Wt 144 lb 3.2 oz (65.4 kg)   LMP 07/15/2012   SpO2 98%   BMI 28.16 kg/m   BMI: Body mass index is 28.16 kg/m.  GEN: Well nourished, well developed in no acute distress NECK: No JVD; No carotid bruits CARDIAC: RRR, no murmurs, rubs, gallops RESPIRATORY:  Clear to auscultation without rales, wheezing or rhonchi  ABDOMEN: Soft, non-tender, non-distended EXTREMITIES: No edema; No deformity  Wt Readings from Last 3 Encounters:  05/09/24 144 lb 3.2 oz (65.4 kg)  05/02/24 143 lb 1.6 oz (64.9 kg)  12/17/23 145 lb 6.4 oz (66 kg)                  ASSESSMENT & PLAN:   HFimpEF  Nonischemic cardiomyopathy - EF mildly reduced in the setting of SVT in 2022. Most recent echo 05/2021 with EF improved to 55-60%. Appears euvolemic on exam and not requiring standing loop diuretic. She is continued on metoprolol  succinate 12.5 mg daily. Soft BP limits further addition of GDMT.   SVT - S/p EPS ablation 08/2020. Monitor 02/2023 with brief episodes of SVT. Metoprolol  as  above. Follows with EP.   PVCs - Burden 3.9% on monitor 02/2023. Metoprolol  as above.    Disposition: F/u with Dr. Gollan or an APP in 1 year.   Medication Adjustments/Labs and Tests Ordered: Current medicines are reviewed at length with the patient today.  Concerns regarding medicines are outlined above. Medication changes, Labs and Tests ordered today are summarized above and listed in the Patient Instructions accessible in Encounters.   Bonney Sinclair Lorene, PA-C 05/09/2024 4:21 PM      [facility] [address] [place], [place] [address] [phone]      [1]  Current Meds  Medication Sig   bacitracin  500 UNIT/GM ointment 2 (two) times daily as needed.   benzonatate  (TESSALON ) 100 MG capsule Take 1 capsule (100 mg total) by mouth 2 (two) times daily as needed for cough.   Biotin 5000 MCG TABS Take 5,000 mcg by mouth daily.   Calcium Carb-Cholecalciferol (CALCIUM + D3 PO) Take 1 tablet daily by mouth.   carboxymethylcellulose (REFRESH PLUS) 0.5 % SOLN Place 1 drop into both eyes 3 (three) times daily as needed (dry eyes).   clobetasol  cream (TEMOVATE ) 0.05 % Apply to bumps on right side twice daily until improved. Avoid face, groin, axilla.   estradiol  (ESTRACE ) 0.1 MG/GM vaginal cream Place 1 Applicatorful vaginally 2 (two) times a week.   estradiol  (ESTRACE ) 1 MG tablet 1/2 tablet q day.   hydrocortisone  (ANUSOL -HC) 25 MG suppository Place 1 suppository (25 mg total) rectally 2 (two) times daily.   metoprolol  succinate (TOPROL -XL) 25 MG 24 hr tablet Take 0.5 tablets (12.5 mg total) by mouth daily.   mometasone  (ELOCON ) 0.1 % lotion Apply to affected area scalp once to twice daily until improved.   montelukast  (SINGULAIR ) 10 MG tablet Take 10 mg at bedtime by mouth.    Multiple Vitamin (MULTIVITAMIN) tablet Take 1 tablet by mouth daily.   pantoprazole  (PROTONIX ) 20 MG tablet Take 1 tablet (20 mg total) by mouth daily as needed.  progesterone  (PROMETRIUM ) 100 MG capsule Take 1 capsule (100 mg total) by mouth daily.   traZODone  (DESYREL ) 50 MG tablet TAKE 1/2 TO 1 TABLET(25 TO 50 MG) BY MOUTH AT BEDTIME AS NEEDED FOR SLEEP   vitamin C (ASCORBIC ACID) 500 MG tablet Take 500 mg by mouth daily.   "

## 2024-05-09 ENCOUNTER — Ambulatory Visit: Attending: Physician Assistant | Admitting: Physician Assistant

## 2024-05-09 ENCOUNTER — Encounter: Payer: Self-pay | Admitting: Physician Assistant

## 2024-05-09 VITALS — BP 108/60 | HR 74 | Ht 60.0 in | Wt 144.2 lb

## 2024-05-09 DIAGNOSIS — I5020 Unspecified systolic (congestive) heart failure: Secondary | ICD-10-CM | POA: Diagnosis not present

## 2024-05-09 DIAGNOSIS — I493 Ventricular premature depolarization: Secondary | ICD-10-CM | POA: Diagnosis not present

## 2024-05-09 DIAGNOSIS — I428 Other cardiomyopathies: Secondary | ICD-10-CM

## 2024-05-09 DIAGNOSIS — I471 Supraventricular tachycardia, unspecified: Secondary | ICD-10-CM | POA: Diagnosis not present

## 2024-05-09 NOTE — Patient Instructions (Signed)
 Medication Instructions:  Your physician recommends that you continue on your current medications as directed. Please refer to the Current Medication list given to you today.  *If you need a refill on your cardiac medications before your next appointment, please call your pharmacy*  Lab Work: No labs ordered today  If you have labs (blood work) drawn today and your tests are completely normal, you will receive your results only by: MyChart Message (if you have MyChart) OR A paper copy in the mail If you have any lab test that is abnormal or we need to change your treatment, we will call you to review the results.  Testing/Procedures: No test ordered today   Follow-Up: At [facility], you and your health needs are our priority.  As part of our continuing mission to provide you with exceptional heart care, our providers are all part of one team.  This team includes your primary Cardiologist (physician) and Advanced Practice Providers or APPs (Physician Assistants and Nurse Practitioners) who all work together to provide you with the care you need, when you need it.  Your next appointment:   1 year(s)  Provider:   You may see Timothy Gollan, MD or one of the following Advanced Practice Providers on your designated Care Team:   Lonni Meager, NP Lesley Maffucci, PA-C Bernardino Bring, PA-C Cadence Plainfield, PA-C Tylene Lunch, NP Barnie Hila, NP    We recommend signing up for the patient portal called MyChart.  Sign up information is provided on this After Visit Summary.  MyChart is used to connect with patients for Virtual Visits (Telemedicine).  Patients are able to view lab/test results, encounter notes, upcoming appointments, etc.  Non-urgent messages can be sent to your provider as well.   To learn more about what you can do with MyChart, go to [email or web address].   Other Instructions

## 2024-05-12 ENCOUNTER — Other Ambulatory Visit: Payer: Self-pay | Admitting: Internal Medicine

## 2024-05-12 ENCOUNTER — Ambulatory Visit: Payer: Self-pay | Admitting: Internal Medicine

## 2024-05-12 DIAGNOSIS — Z79818 Long term (current) use of other agents affecting estrogen receptors and estrogen levels: Secondary | ICD-10-CM

## 2024-06-05 ENCOUNTER — Ambulatory Visit: Admitting: Internal Medicine

## 2024-06-06 ENCOUNTER — Ambulatory Visit: Admitting: Internal Medicine
